# Patient Record
Sex: Female | Born: 1935
Health system: Southern US, Community
[De-identification: ages and names within clinical notes are randomized; demographics above are authoritative.]

## PROBLEM LIST (undated history)

## (undated) DIAGNOSIS — E785 Hyperlipidemia, unspecified: Secondary | ICD-10-CM

## (undated) DIAGNOSIS — I350 Nonrheumatic aortic (valve) stenosis: Secondary | ICD-10-CM

## (undated) DIAGNOSIS — K5730 Diverticulosis of large intestine without perforation or abscess without bleeding: Secondary | ICD-10-CM

## (undated) DIAGNOSIS — M069 Rheumatoid arthritis, unspecified: Secondary | ICD-10-CM

## (undated) DIAGNOSIS — I4891 Unspecified atrial fibrillation: Secondary | ICD-10-CM

## (undated) DIAGNOSIS — Z86718 Personal history of other venous thrombosis and embolism: Secondary | ICD-10-CM

## (undated) DIAGNOSIS — K8020 Calculus of gallbladder without cholecystitis without obstruction: Secondary | ICD-10-CM

## (undated) DIAGNOSIS — C73 Malignant neoplasm of thyroid gland: Secondary | ICD-10-CM

## (undated) DIAGNOSIS — K2970 Gastritis, unspecified, without bleeding: Secondary | ICD-10-CM

## (undated) DIAGNOSIS — I6529 Occlusion and stenosis of unspecified carotid artery: Secondary | ICD-10-CM

## (undated) DIAGNOSIS — J189 Pneumonia, unspecified organism: Secondary | ICD-10-CM

## (undated) DIAGNOSIS — I2510 Atherosclerotic heart disease of native coronary artery without angina pectoris: Secondary | ICD-10-CM

## (undated) DIAGNOSIS — I5032 Chronic diastolic (congestive) heart failure: Secondary | ICD-10-CM

## (undated) DIAGNOSIS — Z8739 Personal history of other diseases of the musculoskeletal system and connective tissue: Secondary | ICD-10-CM

## (undated) DIAGNOSIS — I10 Essential (primary) hypertension: Secondary | ICD-10-CM

## (undated) DIAGNOSIS — M810 Age-related osteoporosis without current pathological fracture: Secondary | ICD-10-CM

## (undated) DIAGNOSIS — H3530 Unspecified macular degeneration: Secondary | ICD-10-CM

## (undated) HISTORY — DX: Unspecified atrial fibrillation: I48.91

## (undated) HISTORY — DX: Age-related osteoporosis without current pathological fracture: M81.0

## (undated) HISTORY — PX: CATARACT EXTRACTION, BILATERAL: SHX1313

## (undated) HISTORY — PX: COLONOSCOPY: SHX174

## (undated) HISTORY — PX: BREAST CYST EXCISION: SHX579

## (undated) HISTORY — DX: Personal history of other diseases of the musculoskeletal system and connective tissue: Z87.39

## (undated) HISTORY — PX: ESOPHAGOGASTRODUODENOSCOPY: SHX1529

## (undated) HISTORY — DX: Rheumatoid arthritis, unspecified: M06.9

## (undated) HISTORY — DX: Calculus of gallbladder without cholecystitis without obstruction: K80.20

## (undated) HISTORY — DX: Malignant neoplasm of thyroid gland: C73

## (undated) HISTORY — DX: Chronic diastolic (congestive) heart failure: I50.32

## (undated) HISTORY — DX: Atherosclerotic heart disease of native coronary artery without angina pectoris: I25.10

## (undated) HISTORY — DX: Gastritis, unspecified, without bleeding: K29.70

## (undated) HISTORY — DX: Hyperlipidemia, unspecified: E78.5

## (undated) HISTORY — DX: Nonrheumatic aortic (valve) stenosis: I35.0

## (undated) HISTORY — DX: Pneumonia, unspecified organism: J18.9

## (undated) HISTORY — DX: Essential (primary) hypertension: I10

## (undated) HISTORY — DX: Unspecified macular degeneration: H35.30

## (undated) HISTORY — DX: Personal history of other venous thrombosis and embolism: Z86.718

## (undated) HISTORY — DX: Occlusion and stenosis of unspecified carotid artery: I65.29

## (undated) HISTORY — DX: Diverticulosis of large intestine without perforation or abscess without bleeding: K57.30

## (undated) HISTORY — PX: TONSILLECTOMY: SUR1361

---

## 1957-10-05 HISTORY — PX: APPENDECTOMY: SHX54

## 1982-10-05 DIAGNOSIS — Z86718 Personal history of other venous thrombosis and embolism: Secondary | ICD-10-CM

## 1982-10-05 HISTORY — PX: ABDOMINAL HYSTERECTOMY: SHX81

## 1982-10-05 HISTORY — DX: Personal history of other venous thrombosis and embolism: Z86.718

## 2002-10-05 HISTORY — PX: WRIST SURGERY: SHX841

## 2003-04-25 ENCOUNTER — Ambulatory Visit (HOSPITAL_COMMUNITY): Admission: RE | Admit: 2003-04-25 | Discharge: 2003-04-25 | Payer: Self-pay | Admitting: Family Medicine

## 2003-04-25 ENCOUNTER — Encounter: Payer: Self-pay | Admitting: Family Medicine

## 2003-06-10 ENCOUNTER — Encounter: Payer: Self-pay | Admitting: Family Medicine

## 2003-06-10 ENCOUNTER — Ambulatory Visit (HOSPITAL_COMMUNITY): Admission: RE | Admit: 2003-06-10 | Discharge: 2003-06-10 | Payer: Self-pay | Admitting: Family Medicine

## 2003-08-23 ENCOUNTER — Ambulatory Visit (HOSPITAL_COMMUNITY): Admission: RE | Admit: 2003-08-23 | Discharge: 2003-08-23 | Payer: Self-pay | Admitting: Cardiology

## 2003-10-01 ENCOUNTER — Emergency Department (HOSPITAL_COMMUNITY): Admission: EM | Admit: 2003-10-01 | Discharge: 2003-10-01 | Payer: Self-pay | Admitting: Emergency Medicine

## 2003-10-08 ENCOUNTER — Encounter: Admission: RE | Admit: 2003-10-08 | Discharge: 2003-10-08 | Payer: Self-pay | Admitting: Orthopedic Surgery

## 2003-10-09 ENCOUNTER — Ambulatory Visit (HOSPITAL_BASED_OUTPATIENT_CLINIC_OR_DEPARTMENT_OTHER): Admission: RE | Admit: 2003-10-09 | Discharge: 2003-10-09 | Payer: Self-pay | Admitting: Orthopedic Surgery

## 2003-10-09 ENCOUNTER — Ambulatory Visit (HOSPITAL_COMMUNITY): Admission: RE | Admit: 2003-10-09 | Discharge: 2003-10-09 | Payer: Self-pay | Admitting: Orthopedic Surgery

## 2004-12-02 ENCOUNTER — Ambulatory Visit: Payer: Self-pay | Admitting: Cardiology

## 2004-12-11 ENCOUNTER — Ambulatory Visit: Payer: Self-pay

## 2005-02-26 ENCOUNTER — Ambulatory Visit: Payer: Self-pay | Admitting: Internal Medicine

## 2005-04-03 ENCOUNTER — Ambulatory Visit: Payer: Self-pay | Admitting: Internal Medicine

## 2005-06-04 ENCOUNTER — Ambulatory Visit: Payer: Self-pay | Admitting: Internal Medicine

## 2005-06-25 ENCOUNTER — Ambulatory Visit: Payer: Self-pay | Admitting: Cardiology

## 2005-12-25 ENCOUNTER — Ambulatory Visit: Payer: Self-pay | Admitting: Cardiology

## 2005-12-31 ENCOUNTER — Ambulatory Visit (HOSPITAL_COMMUNITY): Admission: RE | Admit: 2005-12-31 | Discharge: 2005-12-31 | Payer: Self-pay | Admitting: Family Medicine

## 2006-01-01 ENCOUNTER — Ambulatory Visit (HOSPITAL_COMMUNITY): Admission: RE | Admit: 2006-01-01 | Discharge: 2006-01-01 | Payer: Self-pay | Admitting: Family Medicine

## 2006-01-21 ENCOUNTER — Encounter (HOSPITAL_COMMUNITY): Admission: RE | Admit: 2006-01-21 | Discharge: 2006-04-21 | Payer: Self-pay | Admitting: General Surgery

## 2006-01-28 ENCOUNTER — Encounter: Admission: RE | Admit: 2006-01-28 | Discharge: 2006-01-28 | Payer: Self-pay | Admitting: General Surgery

## 2006-01-28 ENCOUNTER — Encounter (INDEPENDENT_AMBULATORY_CARE_PROVIDER_SITE_OTHER): Payer: Self-pay | Admitting: Specialist

## 2006-01-28 ENCOUNTER — Other Ambulatory Visit: Admission: RE | Admit: 2006-01-28 | Discharge: 2006-01-28 | Payer: Self-pay | Admitting: Interventional Radiology

## 2006-03-08 ENCOUNTER — Ambulatory Visit (HOSPITAL_COMMUNITY): Admission: RE | Admit: 2006-03-08 | Discharge: 2006-03-09 | Payer: Self-pay | Admitting: General Surgery

## 2006-03-08 ENCOUNTER — Encounter (INDEPENDENT_AMBULATORY_CARE_PROVIDER_SITE_OTHER): Payer: Self-pay | Admitting: Specialist

## 2006-04-21 ENCOUNTER — Encounter (INDEPENDENT_AMBULATORY_CARE_PROVIDER_SITE_OTHER): Payer: Self-pay | Admitting: *Deleted

## 2006-04-21 ENCOUNTER — Ambulatory Visit (HOSPITAL_COMMUNITY): Admission: RE | Admit: 2006-04-21 | Discharge: 2006-04-22 | Payer: Self-pay | Admitting: General Surgery

## 2006-05-07 ENCOUNTER — Encounter: Admission: RE | Admit: 2006-05-07 | Discharge: 2006-05-07 | Payer: Self-pay | Admitting: Endocrinology

## 2006-05-14 ENCOUNTER — Encounter: Admission: RE | Admit: 2006-05-14 | Discharge: 2006-05-14 | Payer: Self-pay | Admitting: Endocrinology

## 2006-05-21 ENCOUNTER — Encounter: Admission: RE | Admit: 2006-05-21 | Discharge: 2006-05-21 | Payer: Self-pay | Admitting: Endocrinology

## 2006-06-10 ENCOUNTER — Ambulatory Visit: Payer: Self-pay | Admitting: Cardiology

## 2006-06-18 ENCOUNTER — Ambulatory Visit (HOSPITAL_COMMUNITY): Admission: RE | Admit: 2006-06-18 | Discharge: 2006-06-18 | Payer: Self-pay | Admitting: Family Medicine

## 2006-10-05 DIAGNOSIS — C73 Malignant neoplasm of thyroid gland: Secondary | ICD-10-CM

## 2006-10-05 HISTORY — DX: Malignant neoplasm of thyroid gland: C73

## 2006-10-26 ENCOUNTER — Ambulatory Visit (HOSPITAL_COMMUNITY): Admission: RE | Admit: 2006-10-26 | Discharge: 2006-10-26 | Payer: Self-pay | Admitting: Family Medicine

## 2006-12-09 ENCOUNTER — Ambulatory Visit: Payer: Self-pay | Admitting: Cardiology

## 2007-04-13 ENCOUNTER — Encounter: Admission: RE | Admit: 2007-04-13 | Discharge: 2007-04-13 | Payer: Self-pay | Admitting: General Surgery

## 2007-06-13 ENCOUNTER — Encounter (HOSPITAL_COMMUNITY): Admission: RE | Admit: 2007-06-13 | Discharge: 2007-06-17 | Payer: Self-pay | Admitting: Endocrinology

## 2007-06-28 ENCOUNTER — Ambulatory Visit (HOSPITAL_COMMUNITY): Admission: RE | Admit: 2007-06-28 | Discharge: 2007-06-28 | Payer: Self-pay | Admitting: Family Medicine

## 2007-10-06 DIAGNOSIS — K8020 Calculus of gallbladder without cholecystitis without obstruction: Secondary | ICD-10-CM

## 2007-10-06 HISTORY — DX: Calculus of gallbladder without cholecystitis without obstruction: K80.20

## 2007-10-06 HISTORY — PX: CHOLECYSTECTOMY: SHX55

## 2007-12-14 ENCOUNTER — Ambulatory Visit: Payer: Self-pay | Admitting: Cardiology

## 2007-12-22 ENCOUNTER — Ambulatory Visit: Payer: Self-pay

## 2008-04-27 ENCOUNTER — Ambulatory Visit: Payer: Self-pay | Admitting: Cardiovascular Disease

## 2008-04-28 ENCOUNTER — Inpatient Hospital Stay (HOSPITAL_COMMUNITY): Admission: EM | Admit: 2008-04-28 | Discharge: 2008-04-29 | Payer: Self-pay | Admitting: Emergency Medicine

## 2008-05-01 ENCOUNTER — Ambulatory Visit: Payer: Self-pay | Admitting: Internal Medicine

## 2008-05-07 ENCOUNTER — Encounter: Admission: RE | Admit: 2008-05-07 | Discharge: 2008-05-07 | Payer: Self-pay | Admitting: General Surgery

## 2008-05-08 ENCOUNTER — Ambulatory Visit: Payer: Self-pay | Admitting: Cardiology

## 2008-05-31 ENCOUNTER — Ambulatory Visit: Payer: Self-pay

## 2008-06-22 ENCOUNTER — Ambulatory Visit: Payer: Self-pay | Admitting: Cardiology

## 2008-06-22 ENCOUNTER — Observation Stay (HOSPITAL_COMMUNITY): Admission: EM | Admit: 2008-06-22 | Discharge: 2008-06-26 | Payer: Self-pay | Admitting: Emergency Medicine

## 2008-07-11 ENCOUNTER — Encounter (INDEPENDENT_AMBULATORY_CARE_PROVIDER_SITE_OTHER): Payer: Self-pay | Admitting: General Surgery

## 2008-07-11 ENCOUNTER — Ambulatory Visit (HOSPITAL_COMMUNITY): Admission: RE | Admit: 2008-07-11 | Discharge: 2008-07-12 | Payer: Self-pay | Admitting: General Surgery

## 2008-07-18 ENCOUNTER — Ambulatory Visit: Payer: Self-pay | Admitting: Cardiology

## 2008-07-31 ENCOUNTER — Encounter: Payer: Self-pay | Admitting: Internal Medicine

## 2008-10-03 ENCOUNTER — Encounter: Admission: RE | Admit: 2008-10-03 | Discharge: 2008-10-03 | Payer: Self-pay | Admitting: Endocrinology

## 2008-10-05 HISTORY — PX: IM NAILING FEMORAL SHAFT RETROGRADE: SUR732

## 2009-01-09 DIAGNOSIS — I2510 Atherosclerotic heart disease of native coronary artery without angina pectoris: Secondary | ICD-10-CM | POA: Insufficient documentation

## 2009-01-09 DIAGNOSIS — K5730 Diverticulosis of large intestine without perforation or abscess without bleeding: Secondary | ICD-10-CM | POA: Insufficient documentation

## 2009-01-09 DIAGNOSIS — M810 Age-related osteoporosis without current pathological fracture: Secondary | ICD-10-CM | POA: Insufficient documentation

## 2009-01-09 DIAGNOSIS — M069 Rheumatoid arthritis, unspecified: Secondary | ICD-10-CM | POA: Insufficient documentation

## 2009-01-09 DIAGNOSIS — H3530 Unspecified macular degeneration: Secondary | ICD-10-CM | POA: Insufficient documentation

## 2009-01-10 ENCOUNTER — Encounter: Payer: Self-pay | Admitting: Cardiology

## 2009-01-10 ENCOUNTER — Ambulatory Visit: Payer: Self-pay | Admitting: Cardiology

## 2009-01-10 ENCOUNTER — Ambulatory Visit: Payer: Self-pay

## 2009-01-10 DIAGNOSIS — E785 Hyperlipidemia, unspecified: Secondary | ICD-10-CM | POA: Insufficient documentation

## 2009-01-10 DIAGNOSIS — I6529 Occlusion and stenosis of unspecified carotid artery: Secondary | ICD-10-CM | POA: Insufficient documentation

## 2009-02-04 ENCOUNTER — Encounter: Payer: Self-pay | Admitting: Cardiology

## 2009-02-08 ENCOUNTER — Encounter: Payer: Self-pay | Admitting: Cardiology

## 2009-04-04 ENCOUNTER — Encounter: Payer: Self-pay | Admitting: Cardiology

## 2009-04-15 ENCOUNTER — Telehealth: Payer: Self-pay | Admitting: Cardiology

## 2009-04-25 ENCOUNTER — Telehealth: Payer: Self-pay | Admitting: Cardiology

## 2009-05-09 ENCOUNTER — Encounter: Admission: RE | Admit: 2009-05-09 | Discharge: 2009-05-09 | Payer: Self-pay | Admitting: General Surgery

## 2009-05-13 ENCOUNTER — Ambulatory Visit (HOSPITAL_BASED_OUTPATIENT_CLINIC_OR_DEPARTMENT_OTHER): Admission: RE | Admit: 2009-05-13 | Discharge: 2009-05-13 | Payer: Self-pay | Admitting: General Surgery

## 2009-05-13 ENCOUNTER — Encounter (INDEPENDENT_AMBULATORY_CARE_PROVIDER_SITE_OTHER): Payer: Self-pay | Admitting: General Surgery

## 2009-05-22 ENCOUNTER — Encounter (INDEPENDENT_AMBULATORY_CARE_PROVIDER_SITE_OTHER): Payer: Self-pay | Admitting: *Deleted

## 2009-07-11 ENCOUNTER — Ambulatory Visit: Payer: Self-pay | Admitting: Cardiology

## 2009-07-11 DIAGNOSIS — I10 Essential (primary) hypertension: Secondary | ICD-10-CM | POA: Insufficient documentation

## 2009-07-31 ENCOUNTER — Inpatient Hospital Stay (HOSPITAL_COMMUNITY): Admission: EM | Admit: 2009-07-31 | Discharge: 2009-08-05 | Payer: Self-pay | Admitting: Emergency Medicine

## 2009-07-31 ENCOUNTER — Ambulatory Visit: Payer: Self-pay | Admitting: Internal Medicine

## 2009-07-31 ENCOUNTER — Encounter: Payer: Self-pay | Admitting: Cardiology

## 2009-08-01 ENCOUNTER — Encounter (INDEPENDENT_AMBULATORY_CARE_PROVIDER_SITE_OTHER): Payer: Self-pay | Admitting: Internal Medicine

## 2009-09-26 ENCOUNTER — Ambulatory Visit: Payer: Self-pay | Admitting: Cardiology

## 2010-01-13 ENCOUNTER — Encounter: Payer: Self-pay | Admitting: Cardiology

## 2010-01-14 ENCOUNTER — Ambulatory Visit: Payer: Self-pay

## 2010-01-14 ENCOUNTER — Encounter: Payer: Self-pay | Admitting: Cardiology

## 2010-03-25 ENCOUNTER — Encounter: Payer: Self-pay | Admitting: Cardiology

## 2010-03-28 ENCOUNTER — Ambulatory Visit: Payer: Self-pay | Admitting: Cardiology

## 2010-11-03 ENCOUNTER — Other Ambulatory Visit: Payer: Self-pay | Admitting: Dermatology

## 2010-11-06 NOTE — Miscellaneous (Signed)
Summary: Orders Update  Clinical Lists Changes  Orders: Added new Test order of Carotid Duplex (Carotid Duplex) - Signed 

## 2010-11-06 NOTE — Miscellaneous (Signed)
  Clinical Lists Changes  Observations: Added new observation of US CAROTID: Stable, mild carotid artery disease, bilaterally 40-59% RICA stenosis ( low end range) 0-39% LICA stenosis  f.u 1 year (01/14/2010 10:49)      Carotid Doppler  Procedure date:  01/14/2010  Findings:      Stable, mild carotid artery disease, bilaterally 40-59% RICA stenosis ( low end range) 0-39% LICA stenosis  f.u 1 year

## 2010-11-06 NOTE — Assessment & Plan Note (Signed)
Summary: 6 months 401.1 414.01 pfh,rn   Visit Type:  Follow-up Primary Provider:  Laurene Footman, MD  CC:  CAD.  History of Present Illness: The patient presents for a six-month followup. Since I last saw her she has done well. She remains active exercising routinely. She has had no chest pressure, neck or arm discomfort. She has had no palpitations, presyncope or syncope. She has had no PND or orthopnea. She had some mild discomfort with varicose veins.  Current Medications (verified): 1)  Plavix 75 Mg Tabs (Clopidogrel Bisulfate) .... One By Mouth Daily 2)  Lipitor 40 Mg Tabs (Atorvastatin Calcium) .... One By Mouth Daily 3)  Imdur 30 Mg Xr24h-Tab (Isosorbide Mononitrate) .... Dialy 4)  Metoprolol Tartrate 50 Mg Tabs (Metoprolol Tartrate) .... One Three Times A Day 5)  Asprin 81mg  .... Dialy 6)  Mvi .... One Daily 7)  Otc Eye Gtts .... Daily 8)  Ca With Vitamin D .... Daily 9)  Fish Oil .... Daily 10)  Synthroid 100 Mcg Tabs (Levothyroxine Sodium) .... Daily Except Sunday- 1/2 Tablet 11)  Preservision Areds  Caps (Multiple Vitamins-Minerals) .... Two Times A Day 12)  Tylenol .... As Needed 13)  Nitrostat 0.4 Mg Subl (Nitroglycerin) .... 1 By Mouth As Needed 14)  Cozaar 25 Mg Tabs (Losartan Potassium) .... One Daily  Allergies (verified): 1)  ! Prednisone 2)  ! Hydrocodone  Past History:  Past Medical History: Reviewed history from 01/09/2009 and no changes required. CAD (ICD-414.00) (Right coronary artery was dominant.  There was long 99% stenosis starting in the proximal RCA.  There was an 80% stenosis in the more distal RCA.  There was extensive left-to-right collaterals.  The left main was normal.  Left circumflex had 100% distal vessel.  There were left-to-left collaterals filling the distal vessel. The LAD was occluded in the mid segment.  Distal LAD filled via left-to- left collaterals from obtuse marginal to circumflex.  There were 2 patent diagonals.  There was 50%  stenosis in the second diagonal and 50% proximal LAD stenosis. ) MACULAR DEGENERATION (ICD-362.50) DIVERTICULAR DISEASE (ICD-562.10) OSTEOPOROSIS (ICD-733.00) RHEUMATOID ARTHRITIS (ICD-714.0)  Past Surgical History: Reviewed history from 09/26/2009 and no changes required. Appendectomy Tonsillectomy Hysterectomy Left subtrochanteric femur intramedullary nailing.   Review of Systems       As stated in the HPI and negative for all other systems.   Vital Signs:  Patient profile:   75 year old female Height:      63 inches Weight:      145 pounds BMI:     25.78 Pulse rate:   63 / minute Resp:     16  per minute BP sitting:   140 / 70  (right arm)  Vitals Entered By: Marrion Coy, CNA (March 28, 2010 8:40 AM)  Physical Exam  General:  Well developed, well nourished, in no acute distress. Head:  normocephalic and atraumatic Eyes:  PERRLA/EOM intact; conjunctiva and lids normal. Neck:  Neck supple, no JVD. No masses, thyromegaly or abnormal cervical nodes. Chest Wall:  no deformities or breast masses noted Abdomen:  Bowel sounds positive; abdomen soft and non-tender without masses, organomegaly, or hernias noted. No hepatosplenomegaly. Msk:  Back normal, normal gait. Muscle strength and tone normal. Pulses:  normal pedal pulses bilaterally.   Extremities:  No clubbing or cyanosis. Neurologic:  Alert and oriented x 3. Skin:  Intact without lesions or rashes. Psych:  Normal affect.   EKG  Procedure date:  03/28/2010  Findings:  Sinus rhythm, rate 63, axis within normal limits, intervals within normal limits, no acute ST-T wave changes.  Impression & Recommendations:  Problem # 1:  CAD, UNSPECIFIED SITE (ICD-414.00) She has no new symptoms and will continue with risk reduction. No further studies or change in medication is indicated. Orders: EKG w/ Interpretation (93000)  Problem # 2:  ESSENTIAL HYPERTENSION, BENIGN (ICD-401.1) Her blood pressure is at upper  limits of normal. She will check this at home and I will adjust meds if she is above target.  Problem # 3:  HYPERLIPIDEMIA (ICD-272.4) This is followed by Dr. Evlyn Kanner. I will defer to his expertise.  Problem # 4:  CAROTID STENOSIS (ICD-433.10) She had this checked recently and she has nonobstructive disease to be followed again in one year.  Patient Instructions: 1)  Your physician recommends that you schedule a follow-up appointment in: 12 months with Dr Antoine Poche 2)  Your physician recommends that you continue on your current medications as directed. Please refer to the Current Medication list given to you today.

## 2010-11-12 ENCOUNTER — Other Ambulatory Visit: Payer: Self-pay | Admitting: Endocrinology

## 2010-11-12 DIAGNOSIS — R911 Solitary pulmonary nodule: Secondary | ICD-10-CM

## 2010-11-14 ENCOUNTER — Ambulatory Visit
Admission: RE | Admit: 2010-11-14 | Discharge: 2010-11-14 | Disposition: A | Discharge status: Home / Self Care | Payer: Medicare Other | Source: Ambulatory Visit | Attending: Endocrinology | Admitting: Endocrinology

## 2010-11-14 DIAGNOSIS — R911 Solitary pulmonary nodule: Secondary | ICD-10-CM

## 2010-11-14 MED ORDER — IOHEXOL 300 MG/ML  SOLN
75.0000 mL | Freq: Once | INTRAMUSCULAR | Status: AC | PRN
  Administered 2010-11-14: 75 mL via INTRAVENOUS

## 2010-12-02 ENCOUNTER — Other Ambulatory Visit (HOSPITAL_COMMUNITY): Payer: Self-pay | Admitting: Endocrinology

## 2010-12-02 DIAGNOSIS — S2239XA Fracture of one rib, unspecified side, initial encounter for closed fracture: Secondary | ICD-10-CM

## 2010-12-04 ENCOUNTER — Encounter (HOSPITAL_COMMUNITY): Payer: Self-pay

## 2010-12-04 ENCOUNTER — Encounter (HOSPITAL_COMMUNITY)
Admission: RE | Admit: 2010-12-04 | Discharge: 2010-12-04 | Disposition: A | Discharge status: Home / Self Care | Payer: Medicare Other | Source: Ambulatory Visit | Attending: Endocrinology | Admitting: Endocrinology

## 2010-12-04 ENCOUNTER — Ambulatory Visit (HOSPITAL_COMMUNITY)
Admission: RE | Admit: 2010-12-04 | Discharge: 2010-12-04 | Disposition: A | Discharge status: Home / Self Care | Payer: Medicare Other | Source: Ambulatory Visit | Attending: Endocrinology | Admitting: Endocrinology

## 2010-12-04 DIAGNOSIS — M069 Rheumatoid arthritis, unspecified: Secondary | ICD-10-CM | POA: Insufficient documentation

## 2010-12-04 DIAGNOSIS — S2239XA Fracture of one rib, unspecified side, initial encounter for closed fracture: Secondary | ICD-10-CM

## 2010-12-04 DIAGNOSIS — Z85850 Personal history of malignant neoplasm of thyroid: Secondary | ICD-10-CM | POA: Insufficient documentation

## 2010-12-04 DIAGNOSIS — Z4789 Encounter for other orthopedic aftercare: Secondary | ICD-10-CM | POA: Insufficient documentation

## 2010-12-04 DIAGNOSIS — M810 Age-related osteoporosis without current pathological fracture: Secondary | ICD-10-CM | POA: Insufficient documentation

## 2010-12-04 DIAGNOSIS — R079 Chest pain, unspecified: Secondary | ICD-10-CM | POA: Insufficient documentation

## 2010-12-04 MED ORDER — TECHNETIUM TC 99M MEDRONATE IV KIT
23.9000 | PACK | Freq: Once | INTRAVENOUS | Status: AC | PRN
  Administered 2010-12-04: 23.9 via INTRAVENOUS

## 2011-01-07 LAB — CBC
HCT: 25.7 % — ABNORMAL LOW (ref 36.0–46.0)
Platelets: 169 10*3/uL (ref 150–400)
RBC: 2.75 MIL/uL — ABNORMAL LOW (ref 3.87–5.11)

## 2011-01-07 LAB — BASIC METABOLIC PANEL
BUN: 12 mg/dL (ref 6–23)
Calcium: 7 mg/dL — ABNORMAL LOW (ref 8.4–10.5)
GFR calc Af Amer: >60 mL/min (ref >60)
Potassium: 3.7 mEq/L (ref 3.5–5.1)

## 2011-01-08 LAB — LIPID PANEL
Cholesterol: 130 mg/dL (ref 0–200)
HDL: 35 mg/dL — ABNORMAL LOW (ref >39)
LDL Cholesterol: 88 mg/dL (ref 0–99)
Total CHOL/HDL Ratio: 3.7 RATIO
Triglycerides: 36 mg/dL (ref <150)
VLDL: 7 mg/dL (ref 0–40)

## 2011-01-08 LAB — CBC
HCT: 21.3 % — ABNORMAL LOW (ref 36.0–46.0)
HCT: 26.1 % — ABNORMAL LOW (ref 36.0–46.0)
HCT: 26.6 % — ABNORMAL LOW (ref 36.0–46.0)
HCT: 27.7 % — ABNORMAL LOW (ref 36.0–46.0)
HCT: 31.2 % — ABNORMAL LOW (ref 36.0–46.0)
HCT: 37 % (ref 36.0–46.0)
Hemoglobin: 10.1 g/dL — ABNORMAL LOW (ref 12.0–15.0)
Hemoglobin: 10.7 g/dL — ABNORMAL LOW (ref 12.0–15.0)
Hemoglobin: 12.6 g/dL (ref 12.0–15.0)
Hemoglobin: 7.4 g/dL — CL (ref 12.0–15.0)
Hemoglobin: 8.4 g/dL — ABNORMAL LOW (ref 12.0–15.0)
Hemoglobin: 9.1 g/dL — ABNORMAL LOW (ref 12.0–15.0)
MCHC: 34.7 g/dL (ref 30.0–36.0)
MCHC: 34.8 g/dL (ref 30.0–36.0)
MCHC: 35 g/dL (ref 30.0–36.0)
MCHC: 35 g/dL (ref 30.0–36.0)
MCHC: 35.1 g/dL (ref 30.0–36.0)
MCV: 92.6 fL (ref 78.0–100.0)
MCV: 92.7 fL (ref 78.0–100.0)
MCV: 93 fL (ref 78.0–100.0)
MCV: 93.3 fL (ref 78.0–100.0)
MCV: 94.9 fL (ref 78.0–100.0)
MCV: 95.4 fL (ref 78.0–100.0)
Platelets: 147 10*3/uL — ABNORMAL LOW (ref 150–400)
Platelets: 148 10*3/uL — ABNORMAL LOW (ref 150–400)
Platelets: 161 10*3/uL (ref 150–400)
Platelets: 166 10*3/uL (ref 150–400)
Platelets: 196 10*3/uL (ref 150–400)
RBC: 2.47 MIL/uL — ABNORMAL LOW (ref 3.87–5.11)
RBC: 2.58 MIL/uL — ABNORMAL LOW (ref 3.87–5.11)
RBC: 3.13 MIL/uL — ABNORMAL LOW (ref 3.87–5.11)
RBC: 3.35 MIL/uL — ABNORMAL LOW (ref 3.87–5.11)
RDW: 14.1 % (ref 11.5–15.5)
RDW: 14.5 % (ref 11.5–15.5)
WBC: 4.4 10*3/uL (ref 4.0–10.5)
WBC: 6.6 10*3/uL (ref 4.0–10.5)
WBC: 6.9 10*3/uL (ref 4.0–10.5)
WBC: 7.1 10*3/uL (ref 4.0–10.5)
WBC: 7.6 10*3/uL (ref 4.0–10.5)
WBC: 8.1 10*3/uL (ref 4.0–10.5)
WBC: 9.3 10*3/uL (ref 4.0–10.5)

## 2011-01-08 LAB — BASIC METABOLIC PANEL
BUN: 12 mg/dL (ref 6–23)
BUN: 14 mg/dL (ref 6–23)
BUN: 16 mg/dL (ref 6–23)
CO2: 24 mEq/L (ref 19–32)
CO2: 24 mEq/L (ref 19–32)
CO2: 28 mEq/L (ref 19–32)
Calcium: 6.9 mg/dL — ABNORMAL LOW (ref 8.4–10.5)
Calcium: 7.3 mg/dL — ABNORMAL LOW (ref 8.4–10.5)
Calcium: 7.4 mg/dL — ABNORMAL LOW (ref 8.4–10.5)
Chloride: 104 mEq/L (ref 96–112)
Chloride: 105 mEq/L (ref 96–112)
Chloride: 99 mEq/L (ref 96–112)
Creatinine, Ser: 0.83 mg/dL (ref 0.4–1.2)
Creatinine, Ser: 0.84 mg/dL (ref 0.4–1.2)
GFR calc Af Amer: >60 mL/min (ref >60)
GFR calc Af Amer: >60 mL/min (ref >60)
GFR calc Af Amer: >60 mL/min (ref >60)
GFR calc Af Amer: >60 mL/min (ref >60)
GFR calc non Af Amer: >60 mL/min (ref >60)
GFR calc non Af Amer: >60 mL/min (ref >60)
Glucose, Bld: 94 mg/dL (ref 70–99)
Potassium: 3.8 mEq/L (ref 3.5–5.1)
Potassium: 4 mEq/L (ref 3.5–5.1)
Potassium: 4.5 mEq/L (ref 3.5–5.1)
Sodium: 130 mEq/L — ABNORMAL LOW (ref 135–145)
Sodium: 133 mEq/L — ABNORMAL LOW (ref 135–145)

## 2011-01-08 LAB — TSH: TSH: 0.133 u[IU]/mL — ABNORMAL LOW (ref 0.350–4.500)

## 2011-01-08 LAB — IRON AND TIBC
Iron: 38 ug/dL — ABNORMAL LOW (ref 42–135)
TIBC: 138 ug/dL — ABNORMAL LOW (ref 250–470)

## 2011-01-08 LAB — CROSSMATCH
ABO/RH(D): A POS
Antibody Screen: NEGATIVE

## 2011-01-08 LAB — URINALYSIS, ROUTINE W REFLEX MICROSCOPIC
Bilirubin Urine: NEGATIVE
Nitrite: NEGATIVE
Specific Gravity, Urine: 1.021 (ref 1.005–1.030)
Urobilinogen, UA: 1 mg/dL (ref 0.0–1.0)
pH: 6 (ref 5.0–8.0)

## 2011-01-08 LAB — DIFFERENTIAL
Eosinophils Relative: 0 % (ref 0–5)
Lymphocytes Relative: 7 % — ABNORMAL LOW (ref 12–46)
Lymphs Abs: 0.7 10*3/uL (ref 0.7–4.0)
Monocytes Absolute: 0.5 10*3/uL (ref 0.1–1.0)
Neutro Abs: 8.1 10*3/uL — ABNORMAL HIGH (ref 1.7–7.7)

## 2011-01-08 LAB — BRAIN NATRIURETIC PEPTIDE: Pro B Natriuretic peptide (BNP): 283 pg/mL — ABNORMAL HIGH (ref 0.0–100.0)

## 2011-01-08 LAB — RETICULOCYTES: Retic Count, Absolute: 42.5 10*3/uL (ref 19.0–186.0)

## 2011-01-08 LAB — CARDIAC PANEL(CRET KIN+CKTOT+MB+TROPI)
CK, MB: 14.9 ng/mL — ABNORMAL HIGH (ref 0.3–4.0)
CK, MB: 19.7 ng/mL — ABNORMAL HIGH (ref 0.3–4.0)
CK, MB: 7.6 ng/mL — ABNORMAL HIGH (ref 0.3–4.0)
Relative Index: 0.8 (ref 0.0–2.5)
Relative Index: 1.3 (ref 0.0–2.5)
Relative Index: 1.9 (ref 0.0–2.5)
Total CK: 1064 U/L — ABNORMAL HIGH (ref 7–177)
Total CK: 1120 U/L — ABNORMAL HIGH (ref 7–177)
Total CK: 810 U/L — ABNORMAL HIGH (ref 7–177)
Total CK: 916 U/L — ABNORMAL HIGH (ref 7–177)
Total CK: 974 U/L — ABNORMAL HIGH (ref 7–177)
Total CK: 996 U/L — ABNORMAL HIGH (ref 7–177)
Troponin I: 0.1 ng/mL — ABNORMAL HIGH (ref 0.00–0.06)
Troponin I: 0.45 ng/mL — ABNORMAL HIGH (ref 0.00–0.06)
Troponin I: 0.71 ng/mL (ref 0.00–0.06)
Troponin I: 1.02 ng/mL (ref 0.00–0.06)

## 2011-01-08 LAB — PROTIME-INR
INR: 1.71 — ABNORMAL HIGH (ref 0.00–1.49)
Prothrombin Time: 13.3 seconds (ref 11.6–15.2)
Prothrombin Time: 15.7 seconds — ABNORMAL HIGH (ref 11.6–15.2)
Prothrombin Time: 19.9 seconds — ABNORMAL HIGH (ref 11.6–15.2)

## 2011-01-08 LAB — URINE CULTURE

## 2011-01-08 LAB — POCT CARDIAC MARKERS
Myoglobin, poc: 476 ng/mL (ref 12–200)
Troponin i, poc: <0.05 ng/mL (ref 0.00–0.09)

## 2011-01-08 LAB — APTT: aPTT: 32 seconds (ref 24–37)

## 2011-01-08 LAB — MAGNESIUM: Magnesium: 1.6 mg/dL (ref 1.5–2.5)

## 2011-01-08 LAB — ABO/RH: ABO/RH(D): A POS

## 2011-01-10 LAB — URINALYSIS, ROUTINE W REFLEX MICROSCOPIC
Bilirubin Urine: NEGATIVE
Specific Gravity, Urine: 1.016 (ref 1.005–1.030)
pH: 6.5 (ref 5.0–8.0)

## 2011-01-10 LAB — URINE MICROSCOPIC-ADD ON

## 2011-01-10 LAB — COMPREHENSIVE METABOLIC PANEL
Albumin: 4 g/dL (ref 3.5–5.2)
BUN: 18 mg/dL (ref 6–23)
Calcium: 8.8 mg/dL (ref 8.4–10.5)
Creatinine, Ser: 1.03 mg/dL (ref 0.4–1.2)
Potassium: 4.5 mEq/L (ref 3.5–5.1)
Total Protein: 7.8 g/dL (ref 6.0–8.3)

## 2011-01-10 LAB — DIFFERENTIAL
Lymphocytes Relative: 23 % (ref 12–46)
Lymphs Abs: 1 10*3/uL (ref 0.7–4.0)
Monocytes Absolute: 0.5 10*3/uL (ref 0.1–1.0)
Monocytes Relative: 13 % — ABNORMAL HIGH (ref 3–12)
Neutro Abs: 2.7 10*3/uL (ref 1.7–7.7)

## 2011-01-10 LAB — CBC
HCT: 41.3 % (ref 36.0–46.0)
MCHC: 34.2 g/dL (ref 30.0–36.0)
Platelets: 214 10*3/uL (ref 150–400)
RDW: 13.1 % (ref 11.5–15.5)

## 2011-02-17 NOTE — H&P (Signed)
NAMENOHELY, WHITEHORN NO.:  [id number]   MEDICAL RECORD NO.:  [id number]          PATIENT TYPE:  INP   LOCATION:  2007                         FACILITY:  [facility]   PHYSICIAN:  Rollene Rotunda, MD, FACCDATE OF BIRTH:  08/31/1936   DATE OF ADMISSION:  04/27/2008  DATE OF DISCHARGE:  04/29/2008                              HISTORY & PHYSICAL   PRIMARY CARE PHYSICIAN:  Ernestina Penna, MD   CHIEF COMPLAINT:  Chest pain.   HISTORY OF PRESENT ILLNESS:  This is a 75 year old white female with a  history of obstructive coronary artery disease who developed abrupt  onset of lower sternal, upper epigastric pain tonight at around 8:00  p.m. while watching TV.  The pain is described as a pressure and was  initially rated 8/10.  The pain does not radiate.  She has never had  pain like this before.  After 4 sublingual nitroglycerin, the pain is  now down to 2/10.  The patient walks several times a week, 2 miles at a  time without developing angina including this morning.  The pain was  several hours postprandial and does not feel like her typical acid  reflux.   PAST MEDICAL HISTORY:  1. Obstructive coronary artery disease status post heart      catheterization in 2004 revealing a 90% mid LAD lesion and a total      occluded LAD at the level of the second septal perforator.  Also,      highly diseased right coronary artery with multiple areas greater      than 90%.  In addition, several other vessels had nonobstructive      coronary disease.  It was felt that she had poor bypass targets and      has been treated medically with very little angina over the years.  2. Thyroid carcinoma status post surgical excision and radioactive      iodine therapy.  3. Rheumatoid arthritis.  4. Osteoporosis.  5. Hyperlipidemia.  6. Hypertension.  7. History of total abdominal hysterectomy.  8. History of appendectomy.  9. History of a negative upper endoscopy per her report  approximately      2 years ago.   SOCIAL HISTORY:  Lives in [place] with her husband.  She is a  lifelong nonsmoker.  She walks 2 miles a day, three-four times a week.   FAMILY HISTORY:  Mother died of an MI at age 37, father died of  complications of diabetes and heart disease, and the patient has one  sister with cardiovascular disease.   ALLERGIES:  PREDNISONE.   MEDICINES:  1. Aspirin 81 mg p.o. daily.  2. Plavix 75 mg p.o. daily.  3. Benicar 20 mg p.o. daily.  4. Imdur 30 mg p.o. daily.  5. Toprol-XL 100 mg p.o. daily.  6. Lipitor 40 mg p.o. daily.  7. Fosamax 70 mg p.o. q. week.  8. Synthroid 100 mcg p.o. daily.  9. Patanol eye drops as directed.  10.Tums p.r.n.  11.Calcium p.r.n.  12.Fish oil 1200 mg p.o. daily.   PHYSICAL EXAMINATION:  VITAL SIGNS:  Temperature 97.3, pulse 61,  respiratory rate 12, blood pressure 173/82, and saturation 98% on 2 L.  GENERAL:  This is a pleasant older white female in no acute distress.  HEENT:  She is wearing eye glasses.  Pupils are round and reactive.  Sclerae are clear.  Mucous membranes are moist.  No oral lesions.  NECK:  Supple.  Neck veins are flat.  No carotid bruits.  No  thyromegaly.  LUNGS:  Clear to auscultation bilaterally without wheezing or rales.  CARDIAC:  Normal rate and regular rhythm.  Normal S1 and S2 without  gallop or murmur.  ABDOMEN:  Soft but tender to palpation in the epigastrium.  There is no  rebound or guarding, and bowel sounds are present.  EXTREMITIES:  Reveal no edema, 2+ dorsalis pedis and radial pulses  bilaterally.  No rash or petechiae.  MUSCULOSKELETAL:  No acute joint effusions or deformities.  NEUROLOGIC:  The patient is awake, alert, and oriented x3.  Face is  symmetric with symmetric expressions.  Strength 5/5 in all four  extremities.   DIAGNOSTIC TESTS:  Chest x-ray shows no acute abnormality.  EKG shows  sinus bradycardia with a rate of 53 beats per minute.  No ischemic  changes.   No Q-waves.   LABORATORY:  White blood cell count 5, hemoglobin 13.6, and platelets  198,000.  Sodium 136, potassium 5, BUN 25, creatinine 1.2, and glucose  96.  CK-MB 2.2.  Troponin less than 0.05.   IMPRESSION:  This is a 75 year old white female who is currently  hypertensive with ongoing epigastric and chest pain.   PLAN:  1. Admit to Dr. Antoine Poche to the Telemetry Unit.  Rule out myocardial      infarction by cycling serial EKGs and cardiac enzymes.  2. We will increase her nitroglycerin drip and titrate for blood      pressure and chest pain control.  3. We will continue her medical regimen for coronary artery disease      including aspirin, Plavix, statin, beta-blocker, and Benicar.      Consider echocardiogram.  4. Rule out abdominal causes of pain.  Initially, we will check liver      tests and lipase.  We will consider right upper quadrant ultrasound      and possibly CT scan.  We will perform serial abdominal exams      overnight.  We will obtain her prior upper endoscopy report.  5. History of thyroid carcinoma, continue Synthroid.  6. DVT prophylaxis with subcutaneous Lovenox.  7. H2 blocker for GERD and abdominal pain.       Christell Faith, MD   Electronically Signed     ______________________________  Rollene Rotunda, MD, [facility]    NDL/MEDQ  D:  04/28/2008  T:  04/28/2008  Job:  [id number]

## 2011-02-17 NOTE — Cardiovascular Report (Signed)
[name], [name] NO.:  [id number]   MEDICAL RECORD NO.:  [id number]          PATIENT TYPE:  INP   LOCATION:  3708                         FACILITY:  [facility]   PHYSICIAN:  Marca Ancona, MD      DATE OF BIRTH:  19-Jul-1936   DATE OF PROCEDURE:  DATE OF DISCHARGE:                            CARDIAC CATHETERIZATION   PROCEDURES:  1. Left heart catheterization.  2. Coronary angiography.  3. Left ventriculography.  4. Angio-Seal deployment.   INDICATION:  Chest pain with known coronary artery disease.   PROCEDURE NOTE:  After informed consent was obtained, the right groin  was sterilely prepped and draped.  The 1% lidocaine was used to  anesthetized the right groin area.  The right common femoral artery was  entered using Seldinger technique and a 6-French arterial sheath was  placed in the right common femoral artery.  The JL4 catheter was used to  engage the left coronary artery.  The JR4 catheter was used to engage  the right coronary artery and the ventricle was entered using the angle  pigtail catheter.  At the end of the procedure, femoral angiography was  done and it was determined that the arterial stick was in a good  position and a 6-French Angio-Seal was deployed without complication.   FINDINGS:  1. Hemodynamics:  Aorta 114/52 and LV 115/3/15.  2. Left ventriculography:  LVEF 65-70% and normal wall motion.  3. Coronary angiography:  The coronary system is right dominant.  The      right coronary artery has a long up to 99% stenosis starting in the      proximal RCA extending into the mid RCA.  After an acute marginal      comes off, there is another 80% stenosis in the more distal RCA.      There are extensive left-to-right collaterals from the left      circumflex system and the septal perforators of the proximal LAD.      The left main has no significant disease.  The left circumflex is a      large artery with large obtuse marginal vessels.   There is 100%      stenosis of the distal circumflex; however, they are left-to-left      collaterals with excellent filling to the distal circumflex.  The      LAD is totally occluded in the mid vessel.  The distal LAD fills by      left-to-left collaterals from an obtuse marginal off the circumflex      system.  There are 2 patent diagonals.  There is a 50% stenosis in      the proximal second diagonal.  There is a 50% stenosis in the      proximal LAD.   ASSESSMENT AND PLAN:  There is severe right coronary artery and left  anterior descending coronary artery disease with excellent  collateralization.  The old films from 2005 were reviewed and there does  not appear to be  significant change in the coronary anatomy.  The patient has excellent  left ventricular  function.  I do question whether the pain is related to  cholelithiasis.  I do think it should be okay for her to go forward with  her cholecystectomy.  I will plan to continue medical treatment of  coronary artery disease.      Marca Ancona, MD  Electronically Signed     DM/MEDQ  D:  06/25/2008  T:  06/26/2008  Job:  [id number]

## 2011-02-17 NOTE — Assessment & Plan Note (Signed)
[facility]                            CARDIOLOGY OFFICE NOTE   NAME:[name]                      MRN:          [id number]  DATE:12/14/2007                            DOB:          01-23-1936    PRIMARY CARE PHYSICIAN:  Ernestina Penna, M.D.   REASON FOR PRESENTATION:  Evaluate patient with coronary disease.   HISTORY OF PRESENT ILLNESS:  The patient returns for 42-month followup.  She is 75 years old.  Since I last saw her, she has done well.  She has  remained active doing some exercise and walking routinely.  She did have  one episode of chest discomfort about a week ago after eating.  This was  a heavy discomfort with tingling in her hands.  She was not sure if this  was like previous angina, but she did take a sublingual nitroglycerin.  She otherwise has had no chest discomfort, neck or arm discomfort.  She  has no shortness of breath, PND or orthopnea.  She had no palpitations,  pre-syncope or syncope.   PAST MEDICAL HISTORY:  1. Coronary artery disease (see the December 09, 2006 note for details).  2. Numbness in the face and hands, treated with Plavix.  3. Disc disease.  4. Rheumatoid arthritis.  5. Osteoporosis.  6. Diverticulosis.  7. Hemorrhoids.  8. Hysterectomy.  9. Appendectomy.  10.Cyst removed from hands and left breast.  11.Tonsillectomy.   ALLERGIES AND INTOLERANCES:  PREDNISONE.   MEDICATIONS:  1. Fosamax 70 mg weekly.  2. Benicar 20 mg daily.  3. Plavix 75 mg daily.  4. Imdur 30 mg daily.  5. Metoprolol 100 mg daily.  6. Tums.  7. Patanol eye drops.  8. Lipitor 40 mg daily.  9. Aspirin 81 mg daily.  10.Calcium.  11.Synthroid 125 mcg daily.  12.Fish oil.  13.Over-the-counter eye drops.   REVIEW OF SYSTEMS:  As stated in HPI, otherwise negative for other  systems.   PHYSICAL EXAMINATION:  The patient is in no distress.  Blood pressure  149/82, heart rate 68 and regular, weight 158 pounds, body mass index  is  27.  HEENT: Eyelids unremarkable. Pupils equal, round, and reactive to light.  Fundi not visualized. Oral mucosa is unremarkable.  NECK: No jugular venous distention at 45 degrees. Carotid upstroke brisk  and symmetrical. No bruits, no thyromegaly.  LYMPHATICS: No adenopathy.  LUNGS: Clear to auscultation bilaterally.  BACK: No costovertebral angle tenderness.  CHEST: Unremarkable.  HEART: PMI not displaced or sustained. S1, S2 within normal limits. No  S3. No S4. No clicks, rub or murmurs.  ABDOMEN: Flat, positive bowel sounds, normal in frequency and pitch. No  bruits. No rebound. No guarding. No midline pulsatile mass. No  organomegaly.  SKIN: No rashes, no nodules.  EXTREMITIES: 2+ pulses throughout. No edema.   EKG: Sinus rhythm, rate 64, axis within normal limits, intervals within  normal limits, no acute ST-T wave changes.   ASSESSMENT/PLAN:  1. Coronary disease.  The patient has coronary disease as described in      previous notes.  We are  managing  this medically and she is not      having any active symptoms.  At this point, she will continue with      risk reduction.  I do plan a Cardiolite next year as it will have      been 4 years since her last one.  I will do it sooner if she has      any symptoms.  2. Dyslipidemia.  She has an excellent lipid profile which I reviewed.      This is followed by  Dr. Christell Constant.  She remains on meds as listed.  3. Carotid plaquing.  The patient reports some mild carotid plaque      some years ago.  I will follow up with carotid Doppler.  4. Hypertension.  Blood pressure is slightly elevated, but this is      unusual for her.  She is going to keep an eye on this at home.  She      will continue the meds as listed unless her blood pressure diary      suggests she needs up titration.  5. Followup.  Will see her back in 1 year or  sooner if needed.     Rollene Rotunda, MD, [facility]  Electronically Signed    JH/MedQ  DD: 12/14/2007  DT:  12/14/2007  Job #: [id number]   cc:   Ernestina Penna, M.D.

## 2011-02-17 NOTE — H&P (Signed)
[name], [name] NO.:  [id number]   MEDICAL RECORD NO.:  [id number]          PATIENT TYPE:  EMS   LOCATION:  MAJO                         FACILITY:  [facility]   PHYSICIAN:  Armanda Magic, M.D.     DATE OF BIRTH:  Mar 12, 1936   DATE OF ADMISSION:  06/22/2008  DATE OF DISCHARGE:                              HISTORY & PHYSICAL   PRIMARY CARDIOLOGIST:  Rollene Rotunda, MD, [facility].   CHIEF COMPLAINT:  Chest pain.   HISTORY OF PRESENT ILLNESS:  This is a 75 year old female with a history  of three-vessel coronary disease on medical management and  cholelithiasis who presented to the emergency room with substernal chest  pain.  She was last seen by Dr. Antoine Poche in August 2009 for a stress  myocardial perfusion scan, which showed no inducible ischemia or infarct  and normal LV function with an EF of 84%.  This was done in preparation  for gallbladder surgery.  Around 7:20 p.m. last night, she got out of  the shower and developed constant pressure of the left breast with sharp  component and no radiation of the pain.  She did have some mild  shortness of breath, although she attributed this to anxiety.  She  denied any diaphoresis, nausea, or vomiting.  She states this chest pain  is different from the pain that she was last admitted for 6 weeks ago at  which time she was found to have cholelithiasis.  She says that this  pain is located on the left side where as the pain 6 weeks ago was  located in the midsternal area.  She is currently complaining of 3/10  chest pain, but it is worse, it was 8/10.  She took 3 sublingual  nitroglycerins at home with no relief, but her nitroglycerin were  greater than a year old and she did not have any tingling under her  tongue and did not get any headache after taking them.  Apparently when  EMS got there, they gave her 4 baby aspirin and upon arrival in the  emergency room her chest pain was down to a 3/10.   PAST MEDICAL HISTORY:  1.  Coronary disease.  She has a known history of 25% proximal LAD and      a 90% LAD at the takeoff of the first diagonal.  The LAD is then      occluded after that.  Her first diagonal has 30% stenosis.  There      was a 25% left circumflex stenosis followed by 30% stenosis      extending into a large obtuse marginal branch.  The obtuse marginal      also has a 30% mid stenosis.  The right coronary artery is dominant      with a large proximal 95% stenosis followed by sequential 99% and      80% stenoses.  2. Normal LV function.  3. Rheumatoid arthritis.  4. Osteoporosis.  5. Hemorrhoids.  6. Diverticulosis.  7. Macular degeneration.  8. She is status post laser surgery 2 months ago for a bleeding vessel  in her eye, but has not had any further problems.   PAST SURGICAL HISTORY:  1. Status post hysterectomy.  2. Status post appendectomy.  3. Status post tonsillectomy.  4. Status post cyst removal from hands and left breast.   ALLERGIES:  PREDNISONE.   MEDICATIONS:  1. Fosamax 70 mg weekly.  2. Benicar 20 mg a day.  3. Plavix 75 mg a day.  4. Imdur 30 mg a day.  5. Toprol-XL 100 mg a day.  6. Multivitamin.  7. Lipitor 40 mg a day.  8. Aspirin 81 mg a day.  9. Calcium 600 plus D 1500 mg daily.  10.Fish oil 1200 mg a day.  11.Synthroid 100 mcg on Monday through Saturday, 50 mcg on Sunday.  12.PreserVision 2 tablets daily.   REVIEW OF SYSTEMS:  Other than what is stated in the HPI is negative.   PHYSICAL EXAMINATION:  VITAL SIGNS:  Heart rate 68.  GENERAL:  This is a well-developed, well-nourished white female in no  acute distress.  HEENT:  Benign.  NECK:  Supple without lymphadenopathy.  No carotid bruits.  Carotid  upstrokes are +2 bilaterally.  LUNGS:  Clear to auscultation throughout.  HEART:  Regular rate and rhythm.  No murmurs, rubs, or gallops.  Normal  S1 and S2.  ABDOMEN:  Soft, nontender, and nondistended.  Active bowel sounds.  No  hepatosplenomegaly, no  abdominal bruits.  Positive bowel sounds in all 4  quadrants.  EXTREMITIES:  No cyanosis, erythema, or edema.  Dorsalis pedis pulses +2  bilaterally.   LABORATORY DATA:  Sodium 137, potassium 4.1, chloride 101, bicarb 25,  BUN 21, and creatinine 0.91.  White cell count 6.1, hemoglobin 13.5,  hematocrit 39.8, and platelet count 202.  CPK/MB 1.9 and 2.0, troponin  less than 0.05 x2, lipase 37, total bili 1.3, direct bili 0.1, indirect  bili 1.2, alkaline phosphatase 60, AST 41, and ALT 34.  Chest x-ray  shows cardiomegaly and no active disease.  EKG shows sinus rhythm at 66  beats per minute with no ST changes.   ASSESSMENT:  1. Chest pain syndrome of questionable etiology.  The patient has not      had her cholecystectomy yet, but is scheduled in the next couple of      weeks.  Her chest pain now is different from the last admission      when she was diagnosed with cholelithiasis.  This chest pain is      located over her left breast and nitroglycerin did not help it      today, but her nitroglycerin tablets are also greater than a year      old.  Her bilirubin is slightly elevated today as well as her AST      and question whether this is related to her gallbladder.  She has      also had some improvement in her chest pain with belching.  2. Severe two-vessel coronary disease on medical management with no      ischemia on stress perfusion scan in August 2009.  3. Normal left ventricular function.  4. Cholelithiasis scheduled for surgery later on this month.   PLAN:  Admit to telemetry bed.  Rule out myocardial infarction with  serial enzymes.  We will start IV heparin drip.  I will not put her on  Lovenox since the last time she had Lovenox showed a very large  abdominal hematoma and is  still very tender over that area.  We  will start IV nitroglycerin drip  and titrate for chest pain as blood pressure allows.  We will keep her  n.p.o.  Start Nexium 40 mg a day.  Continue current  medications.  Further workup per Dr. Antoine Poche.      Armanda Magic, M.D.  Electronically Signed     TT/MEDQ  D:  06/23/2008  T:  06/23/2008  Job:  [id number]   cc:   Rollene Rotunda, MD, [facility]

## 2011-02-17 NOTE — Assessment & Plan Note (Signed)
[facility]                            CARDIOLOGY OFFICE NOTE   NAME:[name], [name]                      MRN:          [id number]  DATE:07/18/2008                            DOB:          1936-03-04    PRIMARY CARE PHYSICIAN:  Ernestina Penna, MD   REASON FOR PRESENTATION:  Evaluate the patient with chest pain and  coronary artery disease.   HISTORY OF PRESENT ILLNESS:  The patient was hospitalized on June 23, 2008, with chest pain.  As she was preop for a cholecystectomy, we  did want to make sure that she had had no change in her known coronary  artery disease.  We sent her for catheterization.  This demonstrated  that the EF was 65-70%.  Right coronary artery was dominant.  There was  long 99% stenosis starting in the proximal RCA.  There was an 80%  stenosis in the more distal RCA.  There was extensive left-to-right  collaterals.  The left main was normal.  Left circumflex had 100% distal  vessel.  There were left-to-left collaterals filling the distal vessel.  The LAD was occluded in the mid segment.  Distal LAD filled via left-to-  left collaterals from obtuse marginal to circumflex.  There were 2  patent diagonals.  There was 50% stenosis in the second diagonal and 50%  proximal LAD stenosis.  This was unchanged from her previous.  Therefore, she was discharged from the hospital June 26, 2008.  She  was cleared for surgery.  She has since had her gallbladder removed  laparoscopically.  York Spaniel it took her a while to start recovering from  this but she is now.  She is just now starting to walk.  With this level  of activity, she is not getting any chest pressure, neck or arm  discomfort.  She is not having any palpitation, presyncope, or syncope.  She is having no PND or orthopnea.   PAST MEDICAL HISTORY:  Coronary artery disease as described,  degenerative disk disease, rheumatoid arthritis, osteoporosis,  diverticulosis,  hemorrhoids, hysterectomy, appendectomy, cyst removed  from her hands and left breast, tonsillectomy.   ALLERGIES AND INTOLERANCES:  PREDNISONE.   MEDICATIONS:  1. Fosamax.  2. Benicar 20 mg daily.  3. Plavix 75 mg daily.  4. Lipitor 40 mg daily.  5. Imdur 30 mg daily.  6. Metoprolol 50 mg b.i.d.  7. Aspirin 81 mg daily.  8. Multivitamin.  9. Over-the-counter eye drops.  10.Calcium.  11.Fish oil.  12.Synthroid.  13.Flonase.  14.PreserVision.   REVIEW OF SYSTEMS:  As stated in the HPI, otherwise negative for other  systems.   PHYSICAL EXAMINATION:  GENERAL:  The patient is pleasant and in no  distress.  VITAL SIGNS:  Blood pressure 135/78, heart rate 74 and regular.  HEENT:  Eyes are unremarkable.  Pupils equal, round, and reactive to  light.  Fundi not visualized.  Oral mucosa unremarkable.  NECK:  No jugular venous distention at 45 degrees.  Carotid upstrokes  brisk and symmetrical.  No bruits, no thyromegaly.  LYMPHATICS:  No adenopathy.  LUNGS:  Clear to auscultation bilaterally.  BACK:  No costovertebral angle tenderness.  CHEST:  Unremarkable.  HEART:  PMI not displaced or sustained, S1 and S2 within normal limits.  No S3, no S4, no clicks, no rubs, no murmurs.  ABDOMEN:  Flat, positive bowel sounds, well-healed surgical scars, no  rebound, no guarding, no organomegaly.  SKIN:  No rashes, no nodules.  EXTREMITIES:  Pulses 2+, no edema.   EKG:  Sinus rhythm, rate 69, axis within normals, intervals within  normal limits, no acute ST-wave changes.   ASSESSMENT AND PLAN:  1. Coronary artery disease.  The patient is having no new symptoms.      Her anatomy is unchanged.  She is going to continue with aggressive      risk reduction.  2. Followup.  I will see her back in about 4 months or sooner if      needed.     Rollene Rotunda, MD, [facility]  Electronically Signed    JH/MedQ  DD: 07/18/2008  DT: 07/19/2008  Job #: [id number]   cc:   Ernestina Penna, M.D.

## 2011-02-17 NOTE — Assessment & Plan Note (Signed)
[facility]                            CARDIOLOGY OFFICE NOTE   NAME:Kryder, [name]                      MRN:          [id number]  DATE:05/08/2008                            DOB:          [birth year]    PRIMARY:  Ernestina Penna, MD   REASON FOR PRESENTATION:  The patient with recent hospitalization for  chest pain.   HISTORY OF PRESENT ILLNESS:  The patient was hospitalized from April 27, 2008 to April 29, 2008.  She had chest discomfort.  She subsequently was  found to have cholelithiasis and it was thought that she might have  passed a kidney stone.  She is being considered for possible  cholecystectomy and is due to see the surgeons back in their office  soon.  She said that since that hospitalization, she has had no further  chest discomfort, neck or arm discomfort.  She has had no palpitation,  presyncope, or syncope.  She has had no PND or orthopnea.  Prior to this  hospitalization, she was not doing as much exercise as she had  previously been doing.  She was recently diagnosed with macular  generation, had laser surgery.  She had not gotten yet back into the  pool since that time, though she done little walking.  She is actually  walking with a cane for balance.  This is helping her back pain.  With  this level of activity, she is not developing any cardiovascular  symptoms.   PAST MEDICAL HISTORY:  1. Coronary artery disease (left main normal, LAD proximal 25%      stenosis followed by 30% stenosis followed by 90% stenosis at the      first diagonal followed by total occlusion.  Second, septal      perforator, the first diagonal had 30% stenosis, the circumflex had      25% stenosis followed by 30% stenosis extending into the large mid      obtuse marginal, and a 40% stenosis before mid obtuse marginal.      The mid obtuse marginal had 30% stenosis, the right coronary artery      is dominant, but slender.  There is long proximal 95%  stenosis      followed by 99% stenosis followed by 80% stenosis.  The EF is      60%.).  2. Degenerative disk disease.  3. Rheumatoid arthritis.  4. Osteoporosis.  5. Diverticulosis.  6. Hemorrhoids.  7. Hysterectomy.  8. Appendectomy  9. Cyst removed from the hands and left breast.  10.Tonsillectomy.   ALLERGIES:  Intolerance to PREDNISONE.   MEDICATIONS:  1. Fosamax.  2. Benicar 20 mg daily.  3. Plavix 75 mg daily.  4. Imdur 30 mg daily.  5. Toprol 100 mg daily.  6. Multivitamin.  7. Lipitor 40 mg daily.  8. Aspirin 81 mg daily.  9. Calcium.  10.Fish oil.  11.Synthroid 100 mcg Monday to Saturday, 50 mcg Sunday.  12.Zantac 150 mg daily, preservation.   REVIEW OF SYSTEMS:  As stated in the HPI and otherwise negative for  other systems.  PHYSICAL EXAMINATION:  GENERAL:  The patient is in no distress.  VITAL SIGNS:  Blood pressure 150/73, heart rate 64 and regular, weight  161 pounds, body mass index 29.  HEENT:  Eyes unremarkable.  Pupils equal, round, and reactive to light.  Fundi not visualized.  Oral mucosa normal.  NECK:  No jugular venous distention 45 degrees, carotid upstroke brisk  and symmetric, and no bruits or thyromegaly.  LYMPHATICS:  No cervical, axillary, or inguinal adenopathy.  LUNGS:  Clear to auscultation bilaterally.  BACK:  No costovertebral angle tenderness.  CHEST:  Unremarkable.  HEART:  PMI not displaced or sustained.  S1 and S2 within normal limits,  no S3 or S4, and no clicks, no rubs, or no murmurs.  ABDOMEN:  Flat, positive bowel sounds, normal in frequency and pitch, no  bruits, rebound, guarding, or midline pulsatile mass.  No hepatomegaly  or splenomegaly.  SKIN:  No rashes.  EXTREMITIES:  2+ pulses throughout, no edema, no cyanosis, or no  clubbing.  NEURO:  Oriented to person, place, and time.  Cranial nerves II through  XII grossly intact.  Motor grossly intact.   EKG sinus rhythm, rate 56, axis within normal limits, intervals  within  normal limits, and no acute ST-wave changes.   ASSESSMENT AND PLAN:  1. Coronary disease.  The patient has no ongoing symptoms consist      with unstable angina.  Her symptoms have not changed since her      stress test in 2006.  At this point, she can continue with medical      management risk reduction.  Should she need to come off of Plavix      for any upcoming surgery, that would be reasonable.  She should      continue aspirin unless this is contraindicated with the surgery as      well.  2. Hypertension.  Blood pressure is elevated and it was apparently      slightly elevated in the hospital as well.  She had a blood      pressure cuff at home.  She is anxious when she comes to the      position.  At this point, I am going to have her keep a blood      pressure diary.  She can share these results with me and I      instructed her exactly how to do this.  I will most likely go up on      the Benicar should she have any pressures that are above target.  3. Dyslipidemia.  She had a lipid profile followed by Dr. Christell Constant and      has had excellent control.  4. Followup.  I will see her back in about January or sooner if      needed, but again show drop-off the blood pressure diary.     Rollene Rotunda, MD, [facility]  Electronically Signed    JH/MedQ  DD: 05/08/2008  DT: 05/09/2008  Job #: [id number]   cc:   Ernestina Penna, M.D.

## 2011-02-17 NOTE — Discharge Summary (Signed)
[name], GUEST NO.:  [id number]   MEDICAL RECORD NO.:  [id number]          PATIENT TYPE:  INP   LOCATION:  2007                         FACILITY:  [facility]   PHYSICIAN:  Doylene Canning. Ladona Ridgel, MD    DATE OF BIRTH:  August 08, 1936   DATE OF ADMISSION:  04/27/2008  DATE OF DISCHARGE:  04/29/2008                               DISCHARGE SUMMARY   PROCEDURE:  Ultrasound of the abdomen.   PRIMARY FINAL DISCHARGE DIAGNOSIS:  Chest pain, felt secondary to  biliary colic.   SECONDARY DIAGNOSES:  1. Mid left anterior descending 90%, first diagonal branch 30%,      circumflex 40%, obtuse marginal 30%, right coronary artery 99%, and      ejection fraction 60%, medical therapy recommended.  2. Numbness in her hands and face, treated with Plavix.  3. Disk disease.  4. History of rheumatoid arthritis.  5. Osteoporosis.  6. Diverticulosis.  7. Hemorrhoids.  8. Status post hysterectomy, appendectomy, tonsillectomy as well as      cyst removal.  9. Family history of coronary artery disease.  10.Allergy or intolerance to PREDNISONE.   TIME AT DISCHARGE:  40 minutes.   HOSPITAL COURSE:  [name] is a 75 year old female with a history of  coronary artery disease treated medically.  She had substernal and  epigastric chest pain, which she thought was heart burn.  She took  nitroglycerin, however and got some improvement.  She came to the  hospital and was admitted for further evaluation.   Her cardiac enzymes were negative for MI.  Her amylase was elevated at  133, but lipase was within normal limits.  Vitamin B12 was within normal  limits at 580.  Her SGOT and SGPT were slightly elevated at 48 and 40  respectively, but other CMET values were within normal limits.  A GI  consult was called because there was concern for a GI etiology to her  pain.   An abdominal ultrasound showed cholelithiasis and a dilated common duct,  most likely due to a nonvisualized distal common duct  stone.  She was  seen by Dr. Marina Goodell and started on an H2 blocker.  Dr. Marina Goodell felt that she  had probable biliary colic.  He recommended an H2 blocker.  By April 29, 2008, her pain had resolved.  It was felt that she probably passed a  stone.  A surgical consult was called for disposition and she was seen  by the surgery team.  [name] has had surgery by Dr. Derrell Lolling before  and wanted him to be the one to do any surgery that was needed.  Dr.  Ladona Ridgel felt that the Plavix could be stopped prior to surgery, if  needed.  [name] already has an appointment to follow up with Dr.  Derrell Lolling in August and is to discuss this with him at that time.  Of note,  Norvasc was added to her medication regimen because her blood pressure  was running between 140s and 160s during her acute illness.  The patient  prefers not to make any blood pressure medication  changes at this time.  She states she will track her blood pressure at home and bring those  readings in for Dr. Antoine Poche to review.  Any medication changes can then  be made as an outpatient.  Dr. Ladona Ridgel evaluated [name] on April 29, 2008.  Since she was not felt to need inpatient surgery this admission,  and her symptoms had resolved, he felt that she was stable for discharge  with outpatient followup arranged.   DISCHARGE INSTRUCTIONS:  1. Her activity level to be increased gradually.  2. She is to stick to a low-fat diet.  3. She is to follow up with Dr. Antoine Poche and our office will call her.  4. She is to keep her appointment with Dr. Derrell Lolling in August.  She is      to follow up with Dr. Christell Constant as needed.   DISCHARGE MEDICATIONS:  1. Zantac 150 mg daily.  2. Aspirin 81 mg a day.  3. Plavix 75 mg a day.  4. Isosorbide mononitrate 30 mg a day.  5. Synthroid 100 mcg daily.  6. Toprol-XL 100 mg a day.  7. Benicar 20 mg a day.  8. Lipitor 40 mg a day.      Theodore Demark, PA-C      Doylene Canning. Ladona Ridgel, MD  Electronically  Signed    RB/MEDQ  D:  04/29/2008  T:  04/30/2008  Job:  [id number]   cc:   Angelia Mould. Derrell Lolling, M.D.  Dr. Christell Constant.

## 2011-02-17 NOTE — Discharge Summary (Signed)
NAMEKENNY, STERN               ACCOUNT NO.:  [id number]   MEDICAL RECORD NO.:  [id number]          PATIENT TYPE:  INP   LOCATION:  3708                         FACILITY:  [facility]   PHYSICIAN:  Rollene Rotunda, MD, FACCDATE OF BIRTH:  12-07-1935   DATE OF ADMISSION:  06/23/2008  DATE OF DISCHARGE:  06/26/2008                               DISCHARGE SUMMARY   PRIMARY CARDIOLOGIST:  Rollene Rotunda, MD, [facility]   DISCHARGE DIAGNOSIS:  Chest pain.   SECONDARY DIAGNOSES:  1. Coronary artery disease.  2. Rheumatoid arthritis.  3. Osteoporosis.  4. Hemorrhoids.  5. Diverticulosis.  6. Macular degeneration.  7. Status post hysterectomy.  8. Cholelithiasis, pending cholecystectomy.  9. Status post appendectomy.  10.Status post tonsillectomy.   ALLERGIES:  PREDNISONE.   PROCEDURES:  Left heart cardiac catheterization.   HISTORY OF PRESENT ILLNESS:  This is a 75 year old female with prior  history of coronary artery disease who was in her usual state of health  until evening of June 22, 2008, at approximately 7:20 p.m.  She  developed a constant pressure in her left breast with a sharp combined  and no radiation.  She did have some mild associated shortness of  breath.  She presented to the [facility] ED after taking 3 sublingual  nitroglycerin tablets at home without relief.  In the ED, her point-of-  care cardiac markers were negative and ECG showed no acute ST-T changes.  The patient was admitted for further evaluation and rule-out.   HOSPITAL COURSE:  The patient was ruled out for MI by cardiac markers  x4.  Decision was made to pursue left heart cardiac catheterization.  She had no recurrent pain over the weekend and underwent left heart  cardiac catheterization on Monday, June 25, 2008, revealing  significant LAD and right coronary artery disease with collateralization  via the left circumflex and its branches.  EF of 65-70% with normal wall  motion.  Old films were  evaluated and it was noted that there was no  change in her coronary anatomy from previous catheterization.  Decision  was made to continue medical therapy.  Ms. Esham will be discharged  home today in good condition.  We will arrange followup with Dr.  Antoine Poche and we recommend that she be maintained on beta-blocker therapy  to the perioperative period for her cholecystectomy.   DISCHARGE LABORATORY DATA:  Hemoglobin 13.4, hematocrit 39.0, WBC 4.3,  and platelets 215.  Sodium 137, potassium 4.0, chloride 106, CO2 of 25,  BUN 70, creatinine 0.80, glucose 87, total bilirubin 1.3, and alkaline  phosphatase 57.  AST 34 and ALT 28.  Total protein 7.1, albumin 3.5,  calcium 7.8, magnesium 2.0, and lipase 37.  CK 68, MB 1.0, and troponin-  I 0.02.  Total cholesterol 157, triglycerides 42, HDL 64, and LDL 85.   DISPOSITION:  The patient is being discharged home today in good  condition.   FOLLOWUP PLANS AND APPOINTMENTS:  The patient will follow up with  General Surgery for cholecystectomy.  She is to follow up with Dr.  Antoine Poche on July 18, 2008, at 3:00  p.m.Marland Kitchen   DISCHARGE MEDICATIONS:  1. Aspirin 81 mg daily.  2. Plavix 75 mg daily.  3. Toprol-XL 100 mg daily.  4. Lipitor 40 mg daily.  5. Benicar 20 mg daily.  6. Synthroid 100 mcg daily except for Sundays 50 mcg.  7. Fluticasone nasal spray p.r.n.  8. PreserVision b.i.d.  9. Fish oil 1000 mg daily.  10.Calcium 600 mg plus D daily.  11.Multivitamin daily.  12.Imdur 30 mg daily.  13.Flomax 70 mg q. Week.  14.Nitroglycerin 0.4 mg sublingual p.r.n. chest pain.   OUTSTANDING LABORATORY DATA AND STUDIES:  None.   DURATION OF DISCHARGE ENCOUNTER:  60 minutes including physician time.      Nicolasa Ducking, ANP      Rollene Rotunda, MD, [facility]  Electronically Signed    CB/MEDQ  D:  06/26/2008  T:  06/27/2008  Job:  [id number]

## 2011-02-17 NOTE — Op Note (Signed)
[name], [name]               ACCOUNT NO.:  [id number]   MEDICAL RECORD NO.:  [id number]          PATIENT TYPE:  AMB   LOCATION:  DSC                          FACILITY:  [facility]   PHYSICIAN:  Angelia Mould. Derrell Lolling, M.D.DATE OF BIRTH:  [birth year]   DATE OF PROCEDURE:  05/13/2009  DATE OF DISCHARGE:                               OPERATIVE REPORT   PREOPERATIVE DIAGNOSIS:  Abnormal mammogram, right breast.   POSTOPERATIVE DIAGNOSIS:  Abnormal mammogram, right breast.   OPERATION PERFORMED:  Excision of right breast mass with needle  localization and specimen mammogram.   SURGEON:  Angelia Mould. Derrell Lolling, MD   OPERATIVE INDICATIONS:  This is a 75 year old Caucasian female who was  noted to have some increase in calcifications in the upper outer  quadrant of the right breast about 8 months ago.  She had a core biopsy  of this area by Dr. Tor Netters.  He asked Dr. Jeralyn Ruths to do this  and a biopsy was done of the area at the 12 o'clock position on the  right breast which showed extensive fibrosis and calcifications.  We  elected a 41-month followup.  When she had her follow mammograms on March 15, 2009, there was an area of increasing density at the biopsy site on  the right breast at the 12 o'clock position.  This was an area about 2.3  x 2.1 cm.  This was felt to be of concern.  Options for excisional  biopsy versus core biopsy were discussed with the patient and she  elected to have this excised.  I did evaluate her as an outpatient and  we set this up.   OPERATIVE TECHNIQUE:  The patient underwent wire localization by Dr.  Samuella Cota at the [facility] this morning and the wire went through  the mass.  The patient was brought to the holding area and the films  were reviewed.  The patient was taken to the operating room.  General  anesthesia was induced.  The patient's right breast was prepped and  draped in a sterile fashion.  Intravenous antibiotics were given.  The  patient  was identified as correct patient, correct procedure, and  correct site.  I reviewed the films one more time.  I noticed that the  wire was entering from medially and directed laterally.  Marcaine 0.5%  with epinephrine was used as a local infiltration anesthetic.   I made a curved circumareolar incision excising an ellipse of skin  superiorly.  Dissection was carried down and around the mass all the way  to the pectoralis fascia.  The lumpectomy specimen was removed and was  marked with the 6-color margin marker kit.  Dr. Samuella Cota reviewed this and  said that the density was in the center of the specimen and it looked  good.  This was sent for routine histology.   At the superior margin, there were some thicker tissues and I was  uncertain as to whether this could be some fibrocystic change or a  fibroadenoma and so I excised this and marked it once again with red ink  to mark the new superior margin.  This would be sent for routine  histology.  My suspicion is that this is totally benign.  The wound was  irrigated with saline.  Hemostasis was excellent and achieved with  electrocautery.  The deeper breast tissues were closed with interrupted  sutures of 3-0 Vicryl and skin was closed with running subcuticular  suture of 4-0 Monocryl and Steri-Strips.  Clean bandages were placed and  the patient was taken to the recovery room in stable condition.   ESTIMATED BLOOD LOSS:  About 10 mL.   COMPLICATIONS:  None.   SPONGE, NEEDLES, AND INSTRUMENT COUNTS:  Correct.      Angelia Mould. Derrell Lolling, M.D.  Electronically Signed     HMI/MEDQ  D:  05/13/2009  T:  05/14/2009  Job:  [id number]   cc:   Ernestina Penna, M.D.  Tera Mater. Evlyn Kanner, M.D.  Rollene Rotunda, MD, [facility]

## 2011-02-17 NOTE — Op Note (Signed)
[name], [name]               ACCOUNT NO.:  [id number]   MEDICAL RECORD NO.:  [id number]          PATIENT TYPE:  OIB   LOCATION:  5151                         FACILITY:  [facility]   PHYSICIAN:  Angelia Mould. Derrell Lolling, M.D.DATE OF BIRTH:  September 02, 1936   DATE OF PROCEDURE:  07/11/2008  DATE OF DISCHARGE:  06/26/2008                               OPERATIVE REPORT   PREOPERATIVE DIAGNOSIS:  Chronic cholecystitis with cholelithiasis.   POSTOPERATIVE DIAGNOSIS:  Chronic cholecystitis with cholelithiasis.   OPERATION PERFORMED:  Laparoscopic cholecystectomy with intraoperative  cholangiogram.   SURGEON:  Angelia Mould. Derrell Lolling, MD   FIRST ASSISTANT:  Ollen Gross. Vernell Morgans, MD   OPERATIVE INDICATIONS:  This is a 75 year old white female with coronary  artery disease.  She has been admitted twice over the past 3-4 months  for epigastric pain and bloating.  On both instances, she had to be  hospitalized and underwent cardiac evaluation to rule out myocardial  infarction.  On her last episode, she had a cardiac catheterization,  which looked okay.  She was known to have moderately severe right  coronary artery disease.  Her ejection fraction is 84%.  Gallbladder  ultrasound showed numerous small gallstones.  I evaluated her and I felt  that it was very likely that she was having biliary colic.  Cholecystectomy was advised and she is brought to the operating room  electively for that.   OPERATIVE FINDINGS:  The gallbladder was chronically inflamed.  There  were soft chronic adhesions to the entirety of the gallbladder, but  there was no evidence of acute inflammation.  The liver looked healthy.  The cholangiogram was normal, showing normal intrahepatic and  extrahepatic bile ducts, no filling defects, and no obstruction with  good flow of contrast into the duodenum.  There were some adhesions in  the lower midline from a previous hysterectomy and appendectomy, but  otherwise, the small and large  intestine looked normal.   OPERATIVE TECHNIQUE:  Following the induction of general endotracheal  anesthesia, the patient's abdomen was prepped and draped in a sterile  fashion.  Intravenous antibiotics were given.  The patient was  identified as the correct patient and correct procedure.  A 0.5%  Marcaine with epinephrine was used as local infiltration anesthetic.  A  vertically-oriented incision was made at the lower rim of the umbilicus.  The fascia was incised in the midline.  The abdominal cavity entered  under direct vision.  A 10-mm Hasson trocar was inserted and secured  with pursestring suture of 0 Vicryl.  Pneumoperitoneum was created.  Video camera was inserted with visualization and findings as described  above.  A 10-mm trocar was placed in the subxiphoid region, two 5-mm  trocars placed in the right upper quadrant.  We could identify the  gallbladder fundus and lifted this up.  We spent about 10-50 minutes,  slowly teasing the chronic soft adhesions off of the gallbladder until  we had all of the anatomy completely defined.  We identified the cystic  duct and the cystic artery.  We isolated the cystic artery as it went on  the wall of the gallbladder, secured it with multiple metal clips and  divided.  We created a large window behind the cystic duct.  A  cholangiogram catheter was inserted into the cystic duct.  A  cholangiogram was obtained using the C-arm and the cholangiogram was  normal as described above.  The cholangiogram catheter was removed, the  cystic duct was secured with multiple metal clips and divided.  The  gallbladder was dissected from its bed with electrocautery, placed into  a specimen bag and removed.  The operative field was copiously irrigated  with over 1000 mL of saline.  At the completion of the case, there was  no bleeding and no bile leak whatsoever and all of the irrigation fluid  was returned as clear.  The trocars were removed under direct  vision.  There was no bleeding from the trocar sites.  The pneumoperitoneum was  released.  The fascia at the umbilicus was closed with 0 Vicryl sutures.  The skin incisions were closed with subcuticular sutures of 4-0 Monocryl  and Dermabond.  Clean bandages were placed.  The patient was taken to  recovery room in stable condition.  Estimated blood loss was about 20  mL.  Complications none.  Sponge, needle, and instrument counts were  correct.      Angelia Mould. Derrell Lolling, M.D.  Electronically Signed     HMI/MEDQ  D:  07/11/2008  T:  07/11/2008  Job:  [id number]   cc:   Rollene Rotunda, MD, [facility]  Ernestina Penna, M.D.  Tera Mater. Evlyn Kanner, M.D.

## 2011-02-20 NOTE — Op Note (Signed)
NAMEBETHANEY, OSHANA               ACCOUNT NO.:  [id number]   MEDICAL RECORD NO.:  [id number]          PATIENT TYPE:  OIB   LOCATION:  5736                         FACILITY:  [facility]   PHYSICIAN:  Angelia Mould. Derrell Lolling, M.D.DATE OF BIRTH:  1936-06-14   DATE OF PROCEDURE:  03/08/2006  DATE OF DISCHARGE:                                 OPERATIVE REPORT   PREOPERATIVE DIAGNOSES:  1.  Right thyroid nodule.  2.  Question left thyroid nodule.   POSTOPERATIVE DIAGNOSES:  1.  Follicular adenoma right thyroid lobe.  2.  Hyperplastic nodule left thyroid lobe.  3.  Possible Hashimoto's thyroiditis.   OPERATIONS PERFORMED:  1.  Right thyroid lobectomy with frozen section.  2.  Excise nodule left thyroid inferior pole with frozen section.   SURGEON:  Angelia Mould. Derrell Lolling, M.D.   FIRST ASSISTANT:  Sandria Bales. Ezzard Standing, M.D.   OPERATIVE INDICATIONS:  This is a 75 year old white female who had a chest x-  ray and a CT scan for evaluation of her vague shadowing in the chest.  This  showed only scarring but a 3.3 cm mass in the right thyroid gland was  identified.  Ultrasound of the neck showed a 3 cm mass in the right inferior  thyroid which was solid and a somewhat indistinct 1.9 cm nodule in the  inferior left thyroid.  The radiologist felt that the nodule on the left was  benign.  The nodule on the right side was biopsied under ultrasound guidance  and this showed a follicular lesion or neoplasm and further tissue sampling  was recommended.  It is also notable that she had a nuclear medicine scan  which showed a 2 cm nodule in the right lobe of thyroid gland which was cold  by scan.  I have advised her to undergo right thyroid lobectomy and  evaluation of the left thyroid lobe, possible total thyroidectomy.  She is  brought to the hospital electively for that surgery.   OPERATIVE TECHNIQUE:  Following induction of general endotracheal  anesthesia, the patient was placed in reverse Trendelenburg  position with  the neck extended.  The neck was prepped and draped in sterile fashion.  The  patient was identified.  A curved transverse incision was made anteriorly in  the neck, centered on the midline about 2 cm above the suprasternal notch.  Dissection was carried down through the subcutaneous tissue and the  platysmal muscle.  Skin and platysma flaps were raised superiorly and  inferiorly and the self-retaining retractor was placed.  Strap muscles were  divided in the midline.  Large, smooth, somewhat soft mass of the right  thyroid lobe was actually quite anteriorly located.  We mobilized the  inferior pole of the right thyroid gland, some by blunt dissection.  Some  venous channels were controlled with metal clips and divided.  We then  dissected the strap muscles off of the superior and isolated the superior  pole vessels.  The superior thyroid artery was isolated, ligated in  continuity with 2-0 silk ties, a metal clip was placed superiorly above the  most superior tie  and then the thyroid artery was divided.  There was some  further tissue in the superior pole which we very carefully dissected away  preserving the superior parathyroid gland.  This was also controlled between  2-0 silk ties and metal clips and divided.  This mobilized the superior pole  quite nicely.  We dissected the rest of the thyroid gland from lateral to  medial.  The inferior thyroid artery was isolated as it went onto the  thyroid gland controlled with multiple metal clips and divided.  The middle  thyroid vein also was controlled with multiple metal clips and divided.  We  dissected the thyroid gland up off of the trachea inferiorly and as we went  superiorly, we identified the recurrent laryngeal nerve near the ligament of  Berry and preserved the recurrent laryngeal nerve.  We dissected the right  thyroid lobe and isthmus off of the trachea all the way to the left.  The  isthmus was very small and was  controlled with a single hemostat and the  thyroid lobe was amputated.  The isthmus was ligated with 3-0 Vicryl suture  ligature.   We then evaluated the left thyroid lobe and at the inferior pole of the left  thyroid lobe there was a firm nodule.  On further mobilization and exposure,  this looked like thyroid tissue.  It was not discolored but was quite firm  and discrete.  Inferiorly, there was a small vein which was controlled with  metal clips and divided.  This was then dissected away from the thyroid  tissue and sent to pathology.   The frozen section diagnosis of the right thyroid nodule was follicular  adenoma, no evidence of capsular or vascular invasion.  The frozen section  diagnosis of the left thyroid nodule showed hyperplastic nodule, no evidence  of malignancy, possible Hashimoto's thyroiditis.   Dr. Ezzard Standing and I did not feel that total thyroidectomy was indicated and we  left the majority of the left thyroid lobe in place.   All the areas of dissection were irrigated with saline.  We placed Surgicel  gauze in the bed of the thyroid on the right and on the cut surface of the  right thyroid on the left.  This was observed for at least 10 minutes and  was completely dry.  We left the Surgicel gauze in place.  The strap muscles  were closed in the midline with interrupted sutures of 3-0 Vicryl.  The  platysma muscle was closed with interrupted sutures of 3-0 Vicryl.  Skin  closed with a few skin staples and mostly Steri-Strips.  Clean bandages were  placed.  The patient was taken recovery in stable condition.   ESTIMATED BLOOD LOSS:  About 30 mL.   COMPLICATIONS:  None.   SPONGE AND INSTRUMENT COUNTS:  Correct.      Angelia Mould. Derrell Lolling, M.D.  Electronically Signed     HMI/MEDQ  D:  03/08/2006  T:  03/09/2006  Job:  [id number]   cc:   Ernestina Penna, M.D.  Fax: [phone]   Rollene Rotunda, M.D.  [phone] [address]  [address]  [place] [address]

## 2011-02-20 NOTE — Cardiovascular Report (Signed)
[name], [name]                         ACCOUNT NO.:  [id number]   MEDICAL RECORD NO.:  [id number]                   PATIENT TYPE:  OIB   LOCATION:  2899                                 FACILITY:  [facility]   PHYSICIAN:  Rollene Rotunda, M.D.                DATE OF BIRTH:  Jun 18, 1936   DATE OF PROCEDURE:  10/05/2003  DATE OF DISCHARGE:  08/23/2003                              CARDIAC CATHETERIZATION   PRIMARY CARE PHYSICIAN:  Ernestina Penna, M.D.   INDICATION:  Evaluate patient with abnormal Cardiolite and chest pain.  The  Cardiolite suggested an EF of 70% with evidence of septal, apical and  inferior ischemia.   PROCEDURE NOTE:  Left heart catheterization was performed via the right  femoral artery.  The artery was cannulated using an anterior wall puncture.  A 6 French arterial sheath was inserted via the modified Seldinger  technique.  Preformed Judkins and a pigtail catheter were utilized.  The  patient tolerated the procedure well and left the lab in stable condition.   RESULTS:   HEMODYNAMICS:  1. LV 151/12.  2. Aortic 147/54.   CORONARIES:  The left main was normal.  The LAD was heavily calcified.  There was proximal 25-30% stenosis involving a large first diagonal.  There  was a 90% lesion after the first diagonal before large second septal  perforator.  The vessel was then occluded completely after the second septal  perforator.  The apex did fill scantly via predominantly circumflex  collaterals, but also some septal collaterals.  The first diagonal was large  with proximal long 30% stenosis.  A circumflex in the AV groove had mid 25-  30% stenosis extending into a large mid obtuse marginal. There was a focal  40% stenosis before this mid obtuse marginal.  The mid obtuse marginal had  ostial long 30% stenosis.  The right coronary artery was a dominant though  slender vessel.  There was long proximal 95% stenosis followed by mid 99%  stenosis followed again in the  mid segment by 80% stenosis before PDA and  two small posterior laterals.  The distal RCA posterior laterals were seen  to fill with collaterals predominantly from the circumflex.   LEFT VENTRICULOGRAM:  Left ventriculogram was obtained in the RAO  projection.  The EF was 60% with normal wall motion.   CONCLUSION:  Severe two-vessel coronary artery disease.   PLAN:  At this point, I think the benefit of coronary artery bypass graft  without an obviously bypassable LAD (I cannot see the vessel clearly enough  to understand whether a LIMA could be attached) is probable less than  intensive medical management.  I have had a long discussion with the patient  and his wife about this.  Proceed with treating with beta blockers and high  dose statins.  Rollene Rotunda, M.D.    JH/MEDQ  D:  08/23/2003  T:  08/24/2003  Job:  [id number]   cc:   Ernestina Penna, M.D.  [address] [place]  [place] [address]  Fax: [phone]

## 2011-02-20 NOTE — Op Note (Signed)
[name], [name]                         ACCOUNT NO.:  [id number]   MEDICAL RECORD NO.:  [id number]                   PATIENT TYPE:  AMB   LOCATION:  DSC                                  FACILITY:  [facility]   PHYSICIAN:  Cindee Salt, M.D.                    DATE OF BIRTH:  05/14/1936   DATE OF PROCEDURE:  10/09/2003  DATE OF DISCHARGE:                                 OPERATIVE REPORT   PREOPERATIVE DIAGNOSIS:  Smith fracture, left wrist.   POSTOPERATIVE DIAGNOSIS:  Smith fracture, left wrist.   PROCEDURE:  Open reduction and internal fixation with bone graft using  allograft, left wrist.  Stryker plate was used.   SURGEON:  Cindee Salt, M.D.   ANESTHESIA:  General.   INDICATIONS:  The patient is a 75 year old female who has suffered a fall  and a Smith fracture of her wrist.  She underwent closed reduction.  She is  admitted now for open reduction and internal fixation.   DESCRIPTION OF PROCEDURE:  The patient was brought to the operating room  where a general anesthetic was carried out without difficulty.  She was  prepped using DuraPrep in the supine position, left arm free.  The fracture  was manipulated using image intensifier.  This was partially reduced.  The  limb was exsanguinated with an Esmarch bandage and tourniquet placed high on  the arm and was inflated to 220 mmHg.  A volar incision was made over the  flexor carpi radialis, down through subcutaneous tissue carried radially and  back ulnarly at the wrist, carried down through subcutaneous tissue.  Bleeders were electrocauterized.  Suture was carried between the flexor  carpi radialis and the radial artery.  The pronator quadratus was then  elevated off of the distal radius.  The fracture was immediately apparent.  This was reduced using the image intensifier.  A wide short volar Stryker  plate was then selected.  This was placed using the image intensifier for  position.  The sliding hole was then drilled,  measured.  This measured 12  mm.  This was then tightened.  Two guide pins were then placed distally just  proximal to the articular surface.  The distal pins were then placed after  localization using the guide pins.  These were placed as screws radially,  tacked to the ulnar side and measured between and 16 mm.  These were each  placed securely fixing the distal articular surface.  The proximal screws  were then inserted.  These measured 12 to 14 mm.  These were each fixed and  locked.  The intermediate screws were then placed as screws radially and  pegs ulnarly.  This firmly fixed the distal radius.  X-rays confirmed good  positioning of the fracture with return of length.  Slight volar tilt and  adequate fixation/stabilization.  The bone graft was then inserted after  irrigation.  Oblique x-rays revealed that none of the screws or pegs were  within the joint.  The wound was irrigated. The pronator quadratus was  closed with figure-of-eight 4-0 Vicryl sutures, the subcutaneous tissue with  4-0 Vicryl and the skin with interrupted 5-0 nylon sutures.  A sterile  compressive dressing and plumber's splint was applied.  The patient  tolerated the procedure well.  On inflation of the tourniquet, all fingers  were immediately pink.  She was taken to the recovery room for observation  in satisfactory condition.  She is discharged home to return to the [facility] in one week on Percocet and Keflex.                                              Cindee Salt, M.D.   Angelique Blonder  D:  10/09/2003  T:  10/09/2003  Job:  [id number]

## 2011-02-20 NOTE — Op Note (Signed)
[name], [name]               ACCOUNT NO.:  [id number]   MEDICAL RECORD NO.:  [id number]          PATIENT TYPE:  OIB   LOCATION:  5740                         FACILITY:  [facility]   PHYSICIAN:  Angelia Mould. Derrell Lolling, M.D.DATE OF BIRTH:  08-Jun-1936   DATE OF PROCEDURE:  04/21/2006  DATE OF DISCHARGE:                                 OPERATIVE REPORT   PREOPERATIVE DIAGNOSIS:  1.  Papillary carcinoma of the thyroid.  2.  Status post right thyroid lobectomy.   POSTOPERATIVE DIAGNOSIS:  1.  Papillary carcinoma of the thyroid.  2.  Status post right thyroid lobectomy.   OPERATION PERFORMED:  Completion thyroidectomy (left thyroid lobectomy).   SURGEON:  Angelia Mould. Derrell Lolling, M.D.   FIRST ASSISTANT:  Rose Phi. Maple Hudson, M.D.   OPERATIVE INDICATIONS:  This a 75 year old white female who underwent a  right thyroid lobectomy for a solid mass in the right thyroid lobe and a  small nodule at the isthmus.  Intraoperative frozen section diagnosis was  follicular adenoma.  No evidence of cancer. The final diagnosis showed a  papillary thyroid carcinoma, follicular variant, 2.8 cm in the right lobe,  and a hyperplastic nodule on the left.  The patient was advised to have a  completion thyroidectomy.  She is brought to the operating room in 6 weeks  following her original surgery.   OPERATIVE TECHNIQUE:  Following the induction of general endotracheal  anesthesia, the patient's neck was extended with a small roll behind her  shoulders; and she was placed in a slightly reversed Trendelenburg position.  The neck was prepped and draped in a sterile fashion.  The curved transverse  thyroidectomy incision was reopened.  Dissection was carried down through  the subcutaneous tissue; and through the platysma muscle until we entered a  small seroma cavity, between the platysmal layer and the strap muscles.  Scarring was fairly intense, but we were able to divide the strap muscles in  the midline and dissect  them off of the left thyroid lobe.  There were some  small veins holding the inferior pole of the thyroid lobe which were  controlled with metal clips and divided.   We isolated the superior thyroid artery after skeletonizing the superior  pole.  We ligated the superior thyroid artery in continuity with 2-0 silk  ties and placed a medium metal clip above the most superior tie for  security; and then divided the superior pole vessels.  We mobilized the  superior pole down.  We then dissected the bulk of the thyroid gland from  lateral to medial.  We divided the middle thyroid vein and inferior thyroid  artery between metal clips.  There was a small whitish vascular structure  entering the thyroid gland.  We traced this out very carefully and saw that  it went into the parenchyma of the gland; and then felt this was not a  neurogenic structure.  We identified, what we thought was the recurrent  laryngeal nerve in the tracheoesophageal groove; and coming anterior and  going into the trachea just below the cricoid cartilage.  We felt that  we  had preserved the recurrent laryngeal nerve.  We clearly identified the  superior parathyroid gland.  That was preserved.  We mobilized the remainder  of the thyroid lobe from lateral to medial off of the trachea and sent it  for routine histology.   The wound was irrigated with saline.  Hemostasis was excellent; and had been  achieved mostly with metal clips and a little bit of electrocautery.  I  placed a couple pieces of Surgicel gauze in the bed of the thyroid and  observed this for about 5 minutes; and there was no bleeding.  We closed the  strap muscles with interrupted sutures of 3-0 Vicryl.  Platysmal muscle was  closed with 3-0 Vicryl sutures; and the skin closed with skin staples and  Steri-Strips.  Clean bandages were placed; and the patient taken to the  recovery room in stable condition.   ESTIMATED BLOOD LOSS:  About 25 mL.    COMPLICATIONS:  None.   SPONGE NEEDLE AND INSTRUMENT COUNTS:  Correct      Angelia Mould. Derrell Lolling, M.D.  Electronically Signed     HMI/MEDQ  D:  04/21/2006  T:  04/21/2006  Job:  [id number]   cc:   Ernestina Penna, M.D.  Fax: [phone]   Rollene Rotunda, M.D.  [phone]  [address]  [place]  [place] [address]

## 2011-02-20 NOTE — Letter (Signed)
June 01, 2008    Angelia Mould. Derrell Lolling, M.D.  [address]  [place], [place] [address]   RE:  Alyssa, [name]  MRN:  [id number]  /  DOB:  1936/06/21   Dear Mikey Bussing,   I have been following [name] for her known coronary disease.  She  does have high-grade right coronary artery disease that we have been  managing medically.  Her ejection fraction has been well preserved.  She  has not been particularly active recently and is due to have gallbladder  surgery.  I felt it prudent to screen her with a stress perfusion study.  I was happy to see that this demonstrated the EF to be 84%.  There was  no evidence of ischemia or infarct.   Based on this, the patient is at acceptable risk for the planned  surgery.  If you have any questions, please do not hesitate to give me a  call.  My cell phone number is [phone].    Sincerely,      Rollene Rotunda, MD, [facility]  Electronically Signed    JH/MedQ  DD: 06/01/2008  DT: 06/02/2008  Job #: [id number]

## 2011-02-20 NOTE — Assessment & Plan Note (Signed)
[facility]                              CARDIOLOGY OFFICE NOTE   NAME:[name], [name]                      MRN:          [id number]  DATE:06/10/2006                            DOB:          02-19-1936    REASON FOR PRESENTATION:  Coronary disease.   HISTORY OF PRESENT ILLNESS:  The patient returns for her 6 month followup.  Since I saw her, she has been treated for papillary thyroid cancer with  excision and radio-iodine. She did well with this from a cardiovascular  standpoint. She is now back on her previous medications. She is now starting  to walk a little bit, getting back into her routine. She has not started  water aerobics yet. With her level of activity, she denies any chest  discomfort, neck discomfort, arm discomfort, activity induced nausea or  vomiting, or diaphoresis. She has no palpitations, pre-syncope, or syncope.  She has had no PND or orthopnea.   PAST MEDICAL HISTORY:  Coronary artery disease (see the June 25, 2005  note for details.) Well preserved ejection fraction, numbness of her face  and hands treated with Plavix. Disk disease, rheumatoid arthritis,  osteoporosis, diverticulosis, hemorrhoids, papillary thyroid carcinoma,  hysterectomy, appendectomy, cyst removed from hands and left breast,  tonsillectomy.   ALLERGIES:  PREDNISONE.   MEDICATIONS:  Fish oil, Fosamax 70 mg a week, Benicar 20 mg daily, Plavix 75  mg daily, Imdur 30 mg daily, Toprol XL 100 mg daily, multivitamin, prenatal  eye drops, Lipitor 40 mg daily, Zetia 10 mg daily, aspirin 81 mg daily.   REVIEW OF SYSTEMS:  As stated in the HPI and otherwise, normal other  systems.   PHYSICAL EXAMINATION:  GENERAL:  The patient is in no distress.  VITAL SIGNS:  Blood pressure 119/73, heart 63 and regular, body mass index  27, weight 159 pounds.  HEENT:  Eyes unremarkable. Pupils are equal, round, and reactive to light.  Fundi not visualized.  NECK:  No  jugular venous distention. Wave form within normal limits. Carotid  upstroke brisk and symmetric. No bruits, no thyromegaly.  LYMPHATICS:  Normal.  LUNGS:  Clear to auscultation bilaterally.  BACK:  No costovertebral angle tenderness.  CHEST:  Unremarkable.  HEART:  PMI not displaced or sustained. S1 and S2 within normal limits. No  S3, S4, or murmurs.  ABDOMEN:  Flat, positive bowel sounds. Normal in frequency and pitch. No  bruits, no rebound, no guarding. No midline pulsatile mass, or clinical  organomegaly.  SKIN:  No rashes.  EXTREMITIES:  2+ pulses, no edema.   LABORATORY DATA:  EKG:  Sinus rhythm. Rate 61. Axis within normal limits.  Intervals within normal limits. No acute ST wave changes.   ASSESSMENT:  1. CORONARY DISEASE:  The patient is having no ongoing symptoms. No      further cardiovascular testing is suggested. She will continue with      medications as listed.  2. HYPERTENSION:  Blood pressure is well controlled. She will continue the      medications as listed.  3. DYSLIPIDEMIA:  This is followed by  Dr. Christell Constant and she has an excellent      lipid profile.   FOLLOWUP:  We will see her back in 6 months and then, perhaps yearly, if she  is doing well.                               Rollene Rotunda, MD, [facility]    JH/MedQ  DD:  06/10/2006  DT:  06/10/2006  Job #:  [id number]   cc:   Ernestina Penna, M.D.

## 2011-02-20 NOTE — Assessment & Plan Note (Signed)
[name]                            CARDIOLOGY OFFICE NOTE   NAME:Brady, [name]                      MRN:          [id number]  DATE:12/09/2006                            DOB:          03/21/1936    PRIMARY CARE PHYSICIAN:  Dr. Ernestina Penna.   REASON FOR PRESENTATION:  Evaluate patient with coronary disease.   HISTORY OF PRESENT ILLNESS:  The patient returns for 6 month followup.  She is now 75 years old.  She has done well since I last saw her.  She  has had no chest discomfort, neck or arm discomfort.  She has no  palpitations, presyncope, or syncope.  She has had no PND or orthopnea.  She is bothered by back pain and is due to see the neurosurgeon at  [facility].   PAST MEDICAL HISTORY:  1. Coronary artery disease (Left main normal, LAD proximal 25% is      followed by 30% stenosis followed by 90% stenosis after the first      diagonal, followed by total occlusion at the second septal      perforator, the first diagonal had 30% stenosis, the circumflex had      25% stenosis followed by 30% stenosis extending into a large mid      obtuse marginal and a 40% stenosis before the mid obtuse marginal.      The mid obtuse marginal had 30 % stenosis, the right coronary      artery was dominant but slender.  There was long proximal 95%      stenosis followed by mid 99% stenosis, followed by 80% stenosis,      the EF was 60 %.).  2. Numbness in her face and hands, treated with Plavix.  3. Disk disease.  4. Rheumatoid arthritis.  5. Osteoporosis.  6. Diverticulosis.  7. Hemorrhoids.  8. Hysterectomy.  9. Appendectomy.  10.Had a cyst removed from hands and left breast.  11.Tonsillectomy.   __________  1. Fish oil.  2. Fosamax.  3. Benicar 20 mg daily.  4. Plavix 75 mg daily.  5. Imdur 30 mg daily.  6. Toprol 100 mg daily.  7. Multivitamin.  8. Patanol eye drops.  9. Lipitor 40 mg daily.  10.Zetia 10 mg daily.  11.Aspirin 81 mg  daily.   REVIEW OF SYSTEMS:  As stated a stated in the HPI and otherwise negative  for other systems.   PHYSICAL EXAMINATION:  The patient is in no distress.  Blood pressure  129/77, heart rate 76 and regular, weight 159 pounds, body mass index  27.  HEENT:  Eyelids unremarkable, pupils equally round, and reactive to  light, fundi not visualized, oral mucosa unremarkable.  NECK:  No jugular venous distention at 45 degrees, carotid upstroke  brisk and symmetrical, no bruits, no thyromegaly.  LYMPHATICS:  No cervical, axillary, inguinal adenopathy.  LUNGS:  Clear to auscultation bilaterally.  BACK:  No costovertebral angle tenderness.  CHEST:  Unremarkable.  HEART:  PMI not displaced or sustained, S1 and S2 within normal limits,  no S3, no S4, no clicks,  no rubs, no murmurs.  ABDOMEN:  Flat, positive bowel sounds normal in frequency and pitch, no  bruits, no rebound, no guarding, no midline pulsatile mass, no  hepatomegaly, splenomegaly.  SKIN:  No rashes, no nodules.  EXTREMITIES:  With 2+ pulses, no edema.   ASSESSMENT/PLAN:  1. Coronary disease.  The patient is having no symptoms related to      this.  No further cardiovascular testing is suggested.  She will      continue with aggressive risk reduction.  2. Dyslipidemia.  This is followed by Dr. Vernon Prey and she is having      aggressive management.  3. Followup.  She is going to be seen in 1 year.  At that point I      would like to order a carotid ultrasound as she said she had some      mild plaque a few years back.  This ought to be followed up.     Rollene Rotunda, MD, [facility]  Electronically Signed    JH/MedQ  DD: 12/09/2006  DT: 12/09/2006  Job #: [id number]   cc:   Ernestina Penna, M.D.

## 2011-02-24 ENCOUNTER — Encounter: Payer: Self-pay | Admitting: *Deleted

## 2011-03-05 ENCOUNTER — Other Ambulatory Visit: Payer: Self-pay | Admitting: Cardiology

## 2011-03-05 DIAGNOSIS — I6529 Occlusion and stenosis of unspecified carotid artery: Secondary | ICD-10-CM

## 2011-03-06 ENCOUNTER — Ambulatory Visit (INDEPENDENT_AMBULATORY_CARE_PROVIDER_SITE_OTHER): Payer: Medicare Other | Admitting: Cardiology

## 2011-03-06 ENCOUNTER — Other Ambulatory Visit: Payer: Self-pay | Admitting: *Deleted

## 2011-03-06 ENCOUNTER — Encounter: Payer: Self-pay | Admitting: Cardiology

## 2011-03-06 ENCOUNTER — Encounter (INDEPENDENT_AMBULATORY_CARE_PROVIDER_SITE_OTHER): Payer: Medicare Other | Admitting: *Deleted

## 2011-03-06 VITALS — BP 138/68 | HR 53 | Resp 18 | Ht 63.0 in | Wt 144.0 lb

## 2011-03-06 DIAGNOSIS — I10 Essential (primary) hypertension: Secondary | ICD-10-CM

## 2011-03-06 DIAGNOSIS — I6529 Occlusion and stenosis of unspecified carotid artery: Secondary | ICD-10-CM

## 2011-03-06 DIAGNOSIS — I2510 Atherosclerotic heart disease of native coronary artery without angina pectoris: Secondary | ICD-10-CM

## 2011-03-06 DIAGNOSIS — E785 Hyperlipidemia, unspecified: Secondary | ICD-10-CM

## 2011-03-06 NOTE — Progress Notes (Signed)
HPI The patient presents for followup of her coronary artery disease. Since I last saw her she has done quite well. She denies any ongoing symptoms such as chest discomfort, neck or arm discomfort. She has no palpitations, presyncope or syncope. She has had no weight gain or edema. She denies any shortness of breath, PND or orthopnea. She walks a mile daily.  Allergies  Allergen Reactions  . Hydrocodone   . Prednisone     Current Outpatient Prescriptions  Medication Sig Dispense Refill  . Acetaminophen (TYLENOL PO) Take by mouth.        Marland Kitchen aspirin 81 MG tablet Take 81 mg by mouth daily.        Marland Kitchen atorvastatin (LIPITOR) 40 MG tablet Take 40 mg by mouth daily.        . Calcium Carbonate-Vitamin D (CALCIUM + D PO) Take by mouth daily.        . clopidogrel (PLAVIX) 75 MG tablet Take 75 mg by mouth daily.        . fish oil-omega-3 fatty acids 1000 MG capsule Take 2 g by mouth daily.        . fluticasone (FLONASE) 50 MCG/ACT nasal spray Place 2 sprays into the nose daily.        . isosorbide mononitrate (IMDUR) 30 MG 24 hr tablet Take 30 mg by mouth daily.        Marland Kitchen levothyroxine (SYNTHROID) 100 MCG tablet Take 100 mcg by mouth daily.        Marland Kitchen losartan (COZAAR) 25 MG tablet Take 25 mg by mouth daily.        . metoprolol (TOPROL-XL) 50 MG 24 hr tablet Take 50 mg by mouth 3 (three) times daily.        . Multiple Vitamins-Minerals (OCUVITE PRESERVISION PO) Take by mouth 2 (two) times daily.        . multivitamin (THERAGRAN) per tablet Take 1 tablet by mouth daily.        . nitroGLYCERIN (NITROSTAT) 0.4 MG SL tablet Place 0.4 mg under the tongue every 5 (five) minutes as needed.        [name] Sulfate (EYE DROPS A/C OP) Apply to eye.          Past Medical History  Diagnosis Date  . Osteoporosis   . CAD, UNSPECIFIED SITE   . CAROTID STENOSIS   . DIVERTICULAR DISEASE   . Essential hypertension, benign   . HYPERLIPIDEMIA   . MACULAR DEGENERATION   . OSTEOPOROSIS   . Rheumatoid  arthritis     Past Surgical History  Procedure Date  . Appendectomy   . Tonsillectomy   . Hysterectomy unspecified   . Im nailing femoral shaft retrograde     ROS:  As stated in the HPI and negative for all other systems.  PHYSICAL EXAM BP 138/68  Pulse 53  Resp 18  Ht 5\' 3"  (1.6 m)  Wt 144 lb (65.318 kg)  BMI 25.51 kg/m2 GENERAL:  Well appearing HEENT:  Pupils equal round and reactive, fundi not visualized, oral mucosa unremarkable NECK:  No jugular venous distention, waveform within normal limits, carotid upstroke brisk and symmetric, no bruits, no thyromegaly LYMPHATICS:  No cervical, inguinal adenopathy LUNGS:  Clear to auscultation bilaterally BACK:  No CVA tenderness CHEST:  Unremarkable HEART:  PMI not displaced or sustained,S1 and S2 within normal limits, no S3, no S4, no clicks, no rubs, no murmurs ABD:  Flat, positive bowel sounds normal in frequency in pitch, no  bruits, no rebound, no guarding, no midline pulsatile mass, no hepatomegaly, no splenomegaly EXT:  2 plus pulses throughout, no edema, no cyanosis no clubbing SKIN:  No rashes no nodules NEURO:  Cranial nerves II through XII grossly intact, motor grossly intact throughout PSYCH:  Cognitively intact, oriented to person place and time   EKG:  Sinus rhythm, rate 53, axis within normal limits, intervals within normal limits, no acute ST-T wave changes.   ASSESSMENT AND PLAN

## 2011-03-06 NOTE — Assessment & Plan Note (Signed)
I will defer to Julian Hy, MD

## 2011-03-06 NOTE — Assessment & Plan Note (Signed)
The blood pressure is at target. No change in medications is indicated. We will continue with therapeutic lifestyle changes (TLC).  

## 2011-03-06 NOTE — Assessment & Plan Note (Signed)
The patient has no new sypmtoms.  No further cardiovascular testing is indicated.  We will continue with aggressive risk reduction and meds as listed.  

## 2011-03-06 NOTE — Patient Instructions (Signed)
Continue current medications Follow up with Dr Antoine Poche in 12 months

## 2011-03-06 NOTE — Assessment & Plan Note (Signed)
She had this checked to day and the stenosis is less than 60% bilateral.

## 2011-03-11 ENCOUNTER — Encounter: Payer: Self-pay | Admitting: Cardiology

## 2011-03-13 ENCOUNTER — Telehealth: Payer: Self-pay | Admitting: Cardiology

## 2011-03-13 NOTE — Telephone Encounter (Signed)
Results of carotid U/S given to pt--nt

## 2011-03-13 NOTE — Telephone Encounter (Signed)
Pt rtn call from yesterday to obtain test results

## 2011-07-03 LAB — COMPREHENSIVE METABOLIC PANEL
ALT: 40 — ABNORMAL HIGH
AST: 47 — ABNORMAL HIGH
Albumin: 3.7
BUN: 16
CO2: 26
Calcium: 8.5
Calcium: 8.9
Creatinine, Ser: 0.94
Creatinine, Ser: 0.97
GFR calc Af Amer: >60
GFR calc non Af Amer: 57 — ABNORMAL LOW
Glucose, Bld: 107 — ABNORMAL HIGH
Sodium: 138
Total Protein: 7.3

## 2011-07-03 LAB — CK TOTAL AND CKMB (NOT AT ARMC)
CK, MB: 1.2
CK, MB: 1.9
Relative Index: INVALID
Relative Index: INVALID
Relative Index: INVALID
Total CK: 79
Total CK: 90

## 2011-07-03 LAB — POCT CARDIAC MARKERS
Myoglobin, poc: 88.3
Operator id: 133351

## 2011-07-03 LAB — HEPATIC FUNCTION PANEL
Bilirubin, Direct: 0.2
Indirect Bilirubin: 0.9
Total Bilirubin: 1.1

## 2011-07-03 LAB — DIFFERENTIAL
Basophils Absolute: 0
Lymphocytes Relative: 17
Neutro Abs: 3.5

## 2011-07-03 LAB — PROTIME-INR: Prothrombin Time: 13.1

## 2011-07-03 LAB — POCT I-STAT, CHEM 8
HCT: 41
Hemoglobin: 13.9
Sodium: 136
TCO2: 27

## 2011-07-03 LAB — CBC
Hemoglobin: 13.6
Platelets: 198
RDW: 13.2

## 2011-07-03 LAB — AMYLASE: Amylase: 133 — ABNORMAL HIGH

## 2011-07-03 LAB — VITAMIN B12: Vitamin B-12: 580 (ref 211–911)

## 2011-07-03 LAB — APTT: aPTT: 36

## 2011-07-03 LAB — TROPONIN I: Troponin I: <0.01

## 2011-07-06 LAB — CARDIAC PANEL(CRET KIN+CKTOT+MB+TROPI)
CK, MB: 1
CK, MB: 1.3
Relative Index: INVALID
Relative Index: INVALID
Relative Index: INVALID
Total CK: 68
Total CK: 77
Troponin I: 0.01
Troponin I: 0.02
Troponin I: 0.02

## 2011-07-06 LAB — COMPREHENSIVE METABOLIC PANEL
ALT: 28
ALT: 29
AST: 34
AST: 40 — ABNORMAL HIGH
Albumin: 3.5
Alkaline Phosphatase: 57
BUN: 15
CO2: 27
Chloride: 103
Chloride: 99
Creatinine, Ser: 1.04
GFR calc Af Amer: >60
GFR calc non Af Amer: 53 — ABNORMAL LOW
Potassium: 4
Sodium: 138
Sodium: 133 — ABNORMAL LOW
Total Bilirubin: 1.1
Total Bilirubin: 0.6
Total Protein: 7.1

## 2011-07-06 LAB — BASIC METABOLIC PANEL
BUN: 12
BUN: 9
CO2: 25
CO2: 26
CO2: 29
Calcium: 7.8 — ABNORMAL LOW
Calcium: 8 — ABNORMAL LOW
Calcium: 8.5
Calcium: 9.5
Chloride: 100
Chloride: 102
Creatinine, Ser: 0.77
Creatinine, Ser: 0.8
Creatinine, Ser: 0.84
Creatinine, Ser: 0.91
GFR calc Af Amer: >60
GFR calc Af Amer: >60
GFR calc Af Amer: >60
GFR calc Af Amer: >60
GFR calc non Af Amer: >60
GFR calc non Af Amer: >60
GFR calc non Af Amer: >60
GFR calc non Af Amer: >60
Glucose, Bld: 102 — ABNORMAL HIGH
Glucose, Bld: 91
Potassium: 3.4 — ABNORMAL LOW
Potassium: 4.3
Sodium: 135
Sodium: 137
Sodium: 138

## 2011-07-06 LAB — POCT CARDIAC MARKERS
CKMB, poc: 1.9
CKMB, poc: 2

## 2011-07-06 LAB — HEPARIN LEVEL (UNFRACTIONATED)
Heparin Unfractionated: 0.47
Heparin Unfractionated: 0.67
Heparin Unfractionated: 0.87 — ABNORMAL HIGH

## 2011-07-06 LAB — DIFFERENTIAL
Basophils Absolute: 0
Basophils Absolute: 0.1
Basophils Relative: 1
Eosinophils Absolute: 0.1
Eosinophils Relative: 2
Lymphocytes Relative: 10 — ABNORMAL LOW
Monocytes Absolute: 0.6
Monocytes Relative: 11
Neutro Abs: 4.8
Neutrophils Relative %: 79 — ABNORMAL HIGH

## 2011-07-06 LAB — URINALYSIS, ROUTINE W REFLEX MICROSCOPIC
Glucose, UA: NEGATIVE
Hgb urine dipstick: NEGATIVE
Ketones, ur: NEGATIVE
Protein, ur: NEGATIVE

## 2011-07-06 LAB — LIPID PANEL
LDL Cholesterol: 85
Total CHOL/HDL Ratio: 2.5
Triglycerides: 42
VLDL: 8

## 2011-07-06 LAB — CBC
HCT: 37.2
HCT: 40.4
Hemoglobin: 12.7
Hemoglobin: 13.5
Hemoglobin: 13.7
MCHC: 34.2
MCHC: 34.3
MCHC: 34.5
MCHC: 33.9
MCV: 95.1
MCV: 95.4
Platelets: 178
Platelets: 192
Platelets: 271
RBC: 3.91
RBC: 4.17
RBC: 4.22
RBC: 4.24
RDW: 12.5
RDW: 12.5
RDW: 12.5
WBC: 5.4
WBC: 4.9

## 2011-07-06 LAB — PROTIME-INR
INR: 1
Prothrombin Time: 13.1

## 2011-07-06 LAB — CK TOTAL AND CKMB (NOT AT ARMC)
CK, MB: 1.3
Total CK: 68

## 2011-07-06 LAB — TROPONIN I: Troponin I: 0.01

## 2011-07-06 LAB — HEPATIC FUNCTION PANEL
ALT: 34
AST: 41 — ABNORMAL HIGH
Albumin: 3.8
Bilirubin, Direct: 0.1
Total Bilirubin: 1.3 — ABNORMAL HIGH

## 2011-07-06 LAB — URINE MICROSCOPIC-ADD ON

## 2011-07-15 ENCOUNTER — Other Ambulatory Visit (HOSPITAL_COMMUNITY): Payer: Self-pay | Admitting: Orthopedic Surgery

## 2011-07-15 DIAGNOSIS — M8430XA Stress fracture, unspecified site, initial encounter for fracture: Secondary | ICD-10-CM

## 2011-07-23 ENCOUNTER — Encounter (HOSPITAL_COMMUNITY)
Admission: RE | Admit: 2011-07-23 | Discharge: 2011-07-23 | Disposition: A | Discharge status: Home / Self Care | Payer: Medicare Other | Source: Ambulatory Visit | Attending: Orthopedic Surgery | Admitting: Orthopedic Surgery

## 2011-07-23 ENCOUNTER — Encounter (HOSPITAL_COMMUNITY): Payer: Self-pay

## 2011-07-23 DIAGNOSIS — M25559 Pain in unspecified hip: Secondary | ICD-10-CM | POA: Insufficient documentation

## 2011-07-23 DIAGNOSIS — M8430XA Stress fracture, unspecified site, initial encounter for fracture: Secondary | ICD-10-CM

## 2011-07-23 DIAGNOSIS — M810 Age-related osteoporosis without current pathological fracture: Secondary | ICD-10-CM | POA: Insufficient documentation

## 2011-07-23 DIAGNOSIS — IMO0002 Reserved for concepts with insufficient information to code with codable children: Secondary | ICD-10-CM | POA: Insufficient documentation

## 2011-07-23 MED ORDER — TECHNETIUM TC 99M MEDRONATE IV KIT
24.7000 | PACK | Freq: Once | INTRAVENOUS | Status: AC | PRN
  Administered 2011-07-23: 24.7 via INTRAVENOUS

## 2011-08-05 ENCOUNTER — Telehealth: Payer: Self-pay | Admitting: Cardiology

## 2011-08-05 NOTE — Telephone Encounter (Signed)
Ok to be off the Plavix as needed for injections.

## 2011-08-05 NOTE — Telephone Encounter (Signed)
Please advise 

## 2011-08-05 NOTE — Telephone Encounter (Signed)
Per Shary Key,. Pt need to be off plavix 7 day prior to lumbar injection  On 11/9.

## 2011-08-06 NOTE — Telephone Encounter (Signed)
Per Shary Key, calling regarding stop Plavix. Need an answer today. Fax # [phone].

## 2011-08-06 NOTE — Telephone Encounter (Signed)
Note faxed to number listed.

## 2011-09-16 ENCOUNTER — Telehealth: Payer: Self-pay | Admitting: Cardiology

## 2011-09-16 NOTE — Telephone Encounter (Signed)
Notified that Dr Antoine Poche is out of the office today and that message would be forwarded to his nurse.

## 2011-09-16 NOTE — Telephone Encounter (Signed)
New message:  A form had been faxed for the d/c of Plavix.  Was suppose to be faxed back yesterday and they have not received as yet.  Please call Jae Dire at [phone] and advise.

## 2011-09-16 NOTE — Telephone Encounter (Signed)
Paperwork completed and faxed to number given

## 2011-10-08 ENCOUNTER — Telehealth: Payer: Self-pay | Admitting: Cardiology

## 2011-10-08 NOTE — Telephone Encounter (Signed)
Form was faxed 09/16/11.  Left message for Judeth Cornfield to check with Jae Dire and to call back if they can't find it.

## 2011-10-08 NOTE — Telephone Encounter (Signed)
New problem:  Per Judeth Cornfield,  Patient need come off plavix 7 days prior due to steriod injection.

## 2011-10-08 NOTE — Telephone Encounter (Signed)
Per schedulers - office called back and said they need a new approval to hold Plavix for 7 days for steroid injection.  Aware Dr Antoine Poche not in this week.

## 2011-10-12 ENCOUNTER — Encounter: Payer: Self-pay | Admitting: *Deleted

## 2011-10-12 NOTE — Telephone Encounter (Signed)
OK to hold Plavix as needed for the procedure scheduled.

## 2011-10-12 NOTE — Telephone Encounter (Signed)
Will fax note of Dr Hochrein's approval to hold Plavix

## 2012-01-08 ENCOUNTER — Telehealth: Payer: Self-pay | Admitting: Cardiology

## 2012-01-08 DIAGNOSIS — I6529 Occlusion and stenosis of unspecified carotid artery: Secondary | ICD-10-CM

## 2012-01-08 NOTE — Telephone Encounter (Signed)
New msg Pt called to make appt and was wanting to know when she needs to come in for carotid doppler. Please call her back

## 2012-01-08 NOTE — Telephone Encounter (Signed)
Pt states she was under the impression that she needs a carotid doppler in June.  According to last doppler report, pt was to repeat test in 1 year.  Carotid doppler ordered and pt was notified.  Message sent to [facility] to schedule in June.

## 2012-03-09 ENCOUNTER — Ambulatory Visit: Payer: Medicare Other | Admitting: Cardiology

## 2012-03-14 ENCOUNTER — Encounter (INDEPENDENT_AMBULATORY_CARE_PROVIDER_SITE_OTHER): Payer: Medicare Other

## 2012-03-14 ENCOUNTER — Ambulatory Visit (INDEPENDENT_AMBULATORY_CARE_PROVIDER_SITE_OTHER): Payer: Medicare Other | Admitting: Cardiology

## 2012-03-14 ENCOUNTER — Encounter: Payer: Self-pay | Admitting: Cardiology

## 2012-03-14 VITALS — BP 170/80 | HR 58 | Ht 63.0 in | Wt 153.4 lb

## 2012-03-14 DIAGNOSIS — I10 Essential (primary) hypertension: Secondary | ICD-10-CM

## 2012-03-14 DIAGNOSIS — I6529 Occlusion and stenosis of unspecified carotid artery: Secondary | ICD-10-CM

## 2012-03-14 DIAGNOSIS — E785 Hyperlipidemia, unspecified: Secondary | ICD-10-CM

## 2012-03-14 NOTE — Assessment & Plan Note (Signed)
She has bilateral less than 60% stenosis. This was done today. We will followup in 1 year. She will continue with risk reduction.

## 2012-03-14 NOTE — Assessment & Plan Note (Signed)
Her LDL was 82 and HDL 72 in December of last year. This is an acceptable ratio. She will continue with the meds as listed.

## 2012-03-14 NOTE — Assessment & Plan Note (Signed)
The patient has no new sypmtoms.  No further cardiovascular testing is indicated.  We will continue with aggressive risk reduction and meds as listed.  

## 2012-03-14 NOTE — Assessment & Plan Note (Signed)
The blood pressure continues to be high. I have instructed the patient to record a blood pressure diary and recording this. This will be presented for my review and pending these results I will make further suggestions about changes in therapy for optimal blood pressure control.  

## 2012-03-14 NOTE — Progress Notes (Signed)
HPI The patient presents for followup of her coronary artery disease. Since I last saw her she has done OK from a cardiac standpoint. She denies any ongoing symptoms such as chest discomfort, neck or arm discomfort. She has no palpitations, presyncope or syncope. She has had no weight gain or edema. She denies any shortness of breath, PND or orthopnea. She has had problems with her sciatic nerve. She's required some back injections.   However, she's able to do some walking on a treadmill without significant limitations.  Allergies  Allergen Reactions  . Hydrocodone   . Prednisone     Current Outpatient Prescriptions  Medication Sig Dispense Refill  . Acetaminophen (TYLENOL PO) Take by mouth.        Marland Kitchen aspirin 81 MG tablet Take 81 mg by mouth daily.        Marland Kitchen atorvastatin (LIPITOR) 40 MG tablet Take 40 mg by mouth daily.        . Calcium Carbonate-Vitamin D (CALCIUM + D PO) Take by mouth daily.        . clopidogrel (PLAVIX) 75 MG tablet Take 75 mg by mouth daily.        . fish oil-omega-3 fatty acids 1000 MG capsule Take 2 g by mouth daily.        . fluticasone (FLONASE) 50 MCG/ACT nasal spray Place 2 sprays into the nose daily.        . isosorbide mononitrate (IMDUR) 30 MG 24 hr tablet Take 30 mg by mouth daily.        Marland Kitchen levothyroxine (SYNTHROID) 100 MCG tablet Take 100 mcg by mouth daily.        Marland Kitchen losartan (COZAAR) 25 MG tablet Take 25 mg by mouth daily.        . metoprolol (TOPROL-XL) 50 MG 24 hr tablet Take 50 mg by mouth 3 (three) times daily.        . Multiple Vitamins-Minerals (OCUVITE PRESERVISION PO) Take by mouth 2 (two) times daily.        . multivitamin (THERAGRAN) per tablet Take 1 tablet by mouth daily.        . nitroGLYCERIN (NITROSTAT) 0.4 MG SL tablet Place 0.4 mg under the tongue every 5 (five) minutes as needed.        [name] Sulfate (EYE DROPS A/C OP) Apply to eye.          Past Medical History  Diagnosis Date  . Osteoporosis   . CAD, UNSPECIFIED  SITE     2009. 99% proximal RCA stenosis followed by 80% distal stenosis. The LAD has a 100% stenosis in the midsegment. The circumflex is patent with distal occlusion. She has been managed medically. Her EF was well-preserved.  Marland Kitchen CAROTID STENOSIS   . DIVERTICULAR DISEASE   . Essential hypertension, benign   . HYPERLIPIDEMIA   . MACULAR DEGENERATION   . OSTEOPOROSIS   . Rheumatoid arthritis     Past Surgical History  Procedure Date  . Appendectomy   . Tonsillectomy   . Hysterectomy unspecified   . Im nailing femoral shaft retrograde     ROS:  As stated in the HPI and negative for all other systems.  PHYSICAL EXAM BP 170/80  Pulse 58  Ht 5\' 3"  (1.6 m)  Wt 153 lb 6.4 oz (69.582 kg)  BMI 27.17 kg/m2 GENERAL:  Well appearing HEENT:  Pupils equal round and reactive, fundi not visualized, oral mucosa unremarkable NECK:  No jugular venous distention, waveform within normal  limits, carotid upstroke brisk and symmetric, no bruits, no thyromegaly LUNGS:  Clear to auscultation bilaterally BACK:  No CVA tenderness CHEST:  Unremarkable HEART:  PMI not displaced or sustained,S1 and S2 within normal limits, no S3, no S4, no clicks, no rubs, no murmurs ABD:  Flat, positive bowel sounds normal in frequency in pitch, no bruits, no rebound, no guarding, no midline pulsatile mass, no hepatomegaly, no splenomegaly EXT:  2 plus pulses throughout, no edema, no cyanosis no clubbing  EKG:  Sinus rhythm, rate 58, axis within normal limits, intervals within normal limits, no acute ST-T wave changes.  03/14/2012  ASSESSMENT AND PLAN

## 2012-03-14 NOTE — Patient Instructions (Signed)
Your physician wants you to follow-up in: 1 year. You will receive a reminder letter in the mail two months in advance. If you don't receive a letter, please call our office to schedule the follow-up appointment.  

## 2012-03-16 ENCOUNTER — Encounter: Payer: Self-pay | Admitting: Cardiology

## 2012-05-23 ENCOUNTER — Other Ambulatory Visit: Payer: Self-pay | Admitting: Cardiology

## 2012-05-23 MED ORDER — NITROGLYCERIN 0.4 MG SL SUBL: 0.4000 mg | SUBLINGUAL_TABLET | SUBLINGUAL | Status: DC | PRN

## 2012-05-26 ENCOUNTER — Other Ambulatory Visit: Payer: Self-pay | Admitting: Cardiology

## 2012-05-26 MED ORDER — NITROGLYCERIN 0.4 MG SL SUBL: 0.4000 mg | SUBLINGUAL_TABLET | SUBLINGUAL | Status: DC | PRN

## 2012-05-26 NOTE — Telephone Encounter (Signed)
Pt said they havent received rx please resend

## 2012-07-06 ENCOUNTER — Other Ambulatory Visit: Payer: Self-pay | Admitting: Dermatology

## 2012-09-17 ENCOUNTER — Encounter: Payer: Self-pay | Admitting: Internal Medicine

## 2012-09-22 ENCOUNTER — Encounter: Payer: Self-pay | Admitting: Internal Medicine

## 2012-10-12 ENCOUNTER — Encounter: Payer: Self-pay | Admitting: Internal Medicine

## 2012-11-03 ENCOUNTER — Ambulatory Visit (INDEPENDENT_AMBULATORY_CARE_PROVIDER_SITE_OTHER): Payer: Medicare Other | Admitting: Internal Medicine

## 2012-11-03 ENCOUNTER — Encounter: Payer: Self-pay | Admitting: Internal Medicine

## 2012-11-03 VITALS — BP 142/80 | HR 64 | Ht 61.5 in | Wt 150.5 lb

## 2012-11-03 DIAGNOSIS — Z1211 Encounter for screening for malignant neoplasm of colon: Secondary | ICD-10-CM

## 2012-11-03 DIAGNOSIS — I2510 Atherosclerotic heart disease of native coronary artery without angina pectoris: Secondary | ICD-10-CM

## 2012-11-03 DIAGNOSIS — Z01818 Encounter for other preprocedural examination: Secondary | ICD-10-CM

## 2012-11-03 NOTE — Patient Instructions (Addendum)
It has been recommended to you by your physician that you have a(n) colonoscopy completed. Per your request, we did not schedule the procedure(s) today. Please contact our office at [phone] should you decide to have the procedure completed.  Thank you for choosing me and [facility].  Iva Boop, M.D., [facility]

## 2012-11-03 NOTE — Progress Notes (Signed)
Subjective:    Patient ID: [name], female    DOB: [birth year], 77 y.o.   MRN: [id number]  HPI This pleasant elderly woman is her regarding need for repeat colonoscopy to screen for colorectal polyps and cancer. Last exam 10/2002 was without neoplasia. She has no GI complaints except occasional swelling of hemorrhoids. She has no angina or dyspnea on exertion problems. She takes clopidogrel. She has stopped that intermittently over the years without problems. Allergies  Allergen Reactions  . Hydrocodone   . Prednisone     Oral   Outpatient Prescriptions Prior to Visit  Medication Sig Dispense Refill  . Acetaminophen (TYLENOL PO) Take by mouth.        Marland Kitchen aspirin 81 MG tablet Take 81 mg by mouth daily.        Marland Kitchen atorvastatin (LIPITOR) 40 MG tablet Take 40 mg by mouth daily.        . Calcium Carbonate-Vitamin D (CALCIUM + D PO) Take by mouth daily.        . clopidogrel (PLAVIX) 75 MG tablet Take 75 mg by mouth daily.        . fish oil-omega-3 fatty acids 1000 MG capsule Take 2 g by mouth daily.        . fluticasone (FLONASE) 50 MCG/ACT nasal spray Place 2 sprays into the nose daily.        . isosorbide mononitrate (IMDUR) 30 MG 24 hr tablet Take 30 mg by mouth daily.        Marland Kitchen levothyroxine (SYNTHROID) 100 MCG tablet Take 100 mcg by mouth daily.        Marland Kitchen losartan (COZAAR) 25 MG tablet Take 25 mg by mouth daily.        . metoprolol (TOPROL-XL) 50 MG 24 hr tablet Take 50 mg by mouth 3 (three) times daily.        . Multiple Vitamins-Minerals (OCUVITE PRESERVISION PO) Take by mouth 2 (two) times daily.        . multivitamin (THERAGRAN) per tablet Take 1 tablet by mouth daily.        . nitroGLYCERIN (NITROSTAT) 0.4 MG SL tablet Place 1 tablet (0.4 mg total) under the tongue every 5 (five) minutes as needed.  25 tablet  6  . Tetrahydrozoline-Zn Sulfate (EYE DROPS A/C OP) Apply to eye.         Last reviewed on 11/03/2012  9:30 AM by Iva Boop, MD Past Medical History  Diagnosis Date   . CAD, UNSPECIFIED SITE     2009. 99% proximal RCA stenosis followed by 80% distal stenosis. The LAD has a 100% stenosis in the midsegment. The circumflex is patent with distal occlusion. She has been managed medically. Her EF was well-preserved.  Marland Kitchen CAROTID STENOSIS   . DIVERTICULAR DISEASE   . Essential hypertension, benign   . HYPERLIPIDEMIA   . MACULAR DEGENERATION   . OSTEOPOROSIS   . Rheumatoid arthritis   . Hx of degenerative disc disease   . Gastritis   . Thyroid cancer 2008  . H/O blood clots 1984    in groin area that travels to lungs  . Gallstones 2009   Past Surgical History  Procedure Date  . Appendectomy 1959  . Tonsillectomy   . Abdominal hysterectomy 1984  . Im nailing femoral shaft retrograde 2010    left  . Wrist surgery 2004    Left  . Esophagogastroduodenoscopy   . Colonoscopy   . Breast cyst excision  left  . Cholecystectomy 2009   History   Social History  . Marital Status: Married    Spouse Name: N/A    Number of Children: 1  .     Occupational History  . retired    Social History Main Topics  . Smoking status: Never Smoker   . Smokeless tobacco: Never Used  . Alcohol Use: No  . Drug Use: No  . Sexually Active: None          Social History Narrative   Married, retired1 daughter   Family History  Problem Relation Age of Onset  . Heart attack Mother   . Heart disease Father   . Diabetes Father   . Ovarian cancer Maternal Aunt   . Clotting disorder Mother   . Lung cancer Brother    Review of Systems + back pain (helped by chiropracter) and macular degeneration and vision problems all other ROS negative   Objective:   Physical Exam General:  NAD Eyes:   anicteric Lungs:  clear Heart:  S1S2 no rubs, murmurs or gallops Abdomen:  soft and nontender, BS+  Data Reviewed:  2004 colonoscopy 2006 EGD 03/2012 cardiology note      Assessment & Plan:   1. Special screening for malignant neoplasms, colon   2. Preoperative  examination, unspecified   3. CAD (coronary artery disease), native coronary artery on clopidogrel    1. I have recommended a preventive screening colonoscopy, holding clopidogrel x 5-7 days prior. 2. She wants to discuss this with Dr. Evlyn Kanner before proceeding 3. She was offered annual FOBT also The risks and benefits as well as alternatives of endoscopic procedure(s) have been discussed and reviewed. All questions answered. The patient agrees to proceed. She will call back when ready to schedule which we can do with RN previsit but will need to ask Dr. Jenene Slicker opinion re: holding clopidogrel  I appreciate the opportunity to care for this patient.  [id number] Hessie Diener, MD

## 2013-03-06 ENCOUNTER — Ambulatory Visit (INDEPENDENT_AMBULATORY_CARE_PROVIDER_SITE_OTHER): Payer: Medicare Other | Admitting: Cardiology

## 2013-03-06 ENCOUNTER — Encounter (INDEPENDENT_AMBULATORY_CARE_PROVIDER_SITE_OTHER): Payer: Medicare Other

## 2013-03-06 ENCOUNTER — Encounter: Payer: Self-pay | Admitting: Cardiology

## 2013-03-06 VITALS — BP 145/80 | HR 54 | Ht 62.0 in | Wt 148.0 lb

## 2013-03-06 DIAGNOSIS — I6529 Occlusion and stenosis of unspecified carotid artery: Secondary | ICD-10-CM

## 2013-03-06 DIAGNOSIS — E785 Hyperlipidemia, unspecified: Secondary | ICD-10-CM

## 2013-03-06 DIAGNOSIS — I10 Essential (primary) hypertension: Secondary | ICD-10-CM

## 2013-03-06 DIAGNOSIS — I2510 Atherosclerotic heart disease of native coronary artery without angina pectoris: Secondary | ICD-10-CM

## 2013-03-06 NOTE — Patient Instructions (Addendum)
The current medical regimen is effective;  continue present plan and medications.  Follow up in 1 year with Dr Hochrein.  You will receive a letter in the mail 2 months before you are due.  Please call us when you receive this letter to schedule your follow up appointment.  

## 2013-03-06 NOTE — Progress Notes (Signed)
HPI The patient presents for followup of her coronary artery disease. Since I last saw her she has done OK from a cardiac standpoint. She denies any ongoing symptoms such as chest discomfort, neck or arm discomfort. She has no palpitations, presyncope or syncope. She has had no weight gain or edema. She denies any shortness of breath, PND or orthopnea. She has some neuropathic pain.  However, she's able to do some walking on a treadmill without significant limitations.  She does get SOB if she climbs stairs but this is baseline.   Allergies  Allergen Reactions  . Hydrocodone   . Prednisone     Oral    Current Outpatient Prescriptions  Medication Sig Dispense Refill  . Acetaminophen (TYLENOL PO) Take by mouth.        Marland Kitchen aspirin 81 MG tablet Take 81 mg by mouth daily.        Marland Kitchen atorvastatin (LIPITOR) 40 MG tablet Take 40 mg by mouth daily.        . Calcium Carbonate-Vitamin D (CALCIUM + D PO) Take by mouth daily.        . clopidogrel (PLAVIX) 75 MG tablet Take 75 mg by mouth daily.        . fish oil-omega-3 fatty acids 1000 MG capsule Take 2 g by mouth daily.        . fluticasone (FLONASE) 50 MCG/ACT nasal spray Place 2 sprays into the nose daily.        . isosorbide mononitrate (IMDUR) 30 MG 24 hr tablet Take 30 mg by mouth daily.        Marland Kitchen levothyroxine (SYNTHROID) 100 MCG tablet Take 100 mcg by mouth daily.        Marland Kitchen losartan (COZAAR) 25 MG tablet Take 25 mg by mouth daily.        . metoprolol (TOPROL-XL) 50 MG 24 hr tablet Take 50 mg by mouth 3 (three) times daily.        . Multiple Vitamins-Minerals (OCUVITE PRESERVISION PO) Take by mouth 2 (two) times daily.        . multivitamin (THERAGRAN) per tablet Take 1 tablet by mouth daily.        . nitroGLYCERIN (NITROSTAT) 0.4 MG SL tablet Place 1 tablet (0.4 mg total) under the tongue every 5 (five) minutes as needed.  25 tablet  6  . pregabalin (LYRICA) 75 MG capsule Take 75 mg by mouth daily.      [name] Sulfate (EYE DROPS  A/C OP) Apply to eye.         No current facility-administered medications for this visit.    Past Medical History  Diagnosis Date  . CAD, UNSPECIFIED SITE     2009. 99% proximal RCA stenosis followed by 80% distal stenosis. The LAD has a 100% stenosis in the midsegment. The circumflex is patent with distal occlusion. She has been managed medically. Her EF was well-preserved.  Marland Kitchen CAROTID STENOSIS   . DIVERTICULAR DISEASE   . Essential hypertension, benign   . HYPERLIPIDEMIA   . MACULAR DEGENERATION   . OSTEOPOROSIS   . Rheumatoid arthritis(714.0)   . Hx of degenerative disc disease   . Gastritis   . Thyroid cancer 2008  . H/O blood clots 1984    in groin area that travels to lungs  . Gallstones 2009    Past Surgical History  Procedure Laterality Date  . Appendectomy  1959  . Tonsillectomy    . Abdominal hysterectomy  1984  .  Im nailing femoral shaft retrograde  2010    left  . Wrist surgery  2004    Left  . Esophagogastroduodenoscopy    . Colonoscopy    . Breast cyst excision      left  . Cholecystectomy  2009    ROS:  As stated in the HPI and negative for all other systems.  PHYSICAL EXAM BP 145/80  Pulse 54  Ht 5\' 2"  (1.575 m)  Wt 148 lb (67.132 kg)  BMI 27.06 kg/m2 GENERAL:  Well appearing HEENT:  Pupils equal round and reactive, fundi not visualized, oral mucosa unremarkable NECK:  No jugular venous distention, waveform within normal limits, carotid upstroke brisk and symmetric, no bruits, no thyromegaly LUNGS:  Clear to auscultation bilaterally BACK:  No CVA tenderness CHEST:  Unremarkable HEART:  PMI not displaced or sustained,S1 and S2 within normal limits, no S3, no S4, no clicks, no rubs, no murmurs ABD:  Flat, positive bowel sounds normal in frequency in pitch, no bruits, no rebound, no guarding, no midline pulsatile mass, no hepatomegaly, no splenomegaly EXT:  2 plus pulses upper and diminished DP/PT bilateral as I have a, no edema, no cyanosis no  clubbing  EKG:  Sinus rhythm, rate 54, axis within normal limits, intervals within normal limits, no acute ST-T wave changes.  03/06/2013  ASSESSMENT AND PLAN  CAD: The patient has no new sypmtoms.  No further cardiovascular testing is indicated.  We will continue with aggressive risk reduction and meds as listed.  HTN:   Her blood pressure is slightly elevated and I suggested that she should keep a BP diaryshe keep a blood pressure diary.  HYPERLIPIDEMIA:  This is followed by . SOUTH,STEPHEN ALAN, MDA  CAROTID STENOSIS:  Done today.  I will follow up.

## 2013-05-19 ENCOUNTER — Other Ambulatory Visit: Payer: Self-pay | Admitting: Physician Assistant

## 2013-06-21 ENCOUNTER — Telehealth: Payer: Self-pay | Admitting: Cardiology

## 2013-06-21 NOTE — Telephone Encounter (Signed)
New problem   Need nurse to call her concerning cardiac clearance for pt's oral sx that need to be scheduled ASAP

## 2013-06-21 NOTE — Telephone Encounter (Signed)
Erica from Dr. Elwyn Lade office wanting to know if Dr. Antoine Poche had cleared pt for dental procedure.  Unable to find form so Alcario Drought will refax and will put on Pam's (Dr. Jenene Slicker) nurse cart to address when he is in office Thurs PM.

## 2013-06-27 ENCOUNTER — Telehealth: Payer: Self-pay | Admitting: Cardiology

## 2013-06-27 NOTE — Telephone Encounter (Signed)
Received request from Nurse, documents faxed for surgical clearance. To: [facility] Fax number: [phone] Attention: 06/27/13/km

## 2013-10-28 ENCOUNTER — Emergency Department (HOSPITAL_COMMUNITY): Payer: Medicare Other

## 2013-10-28 ENCOUNTER — Encounter (HOSPITAL_COMMUNITY): Payer: Self-pay | Admitting: Emergency Medicine

## 2013-10-28 ENCOUNTER — Other Ambulatory Visit: Payer: Self-pay

## 2013-10-28 ENCOUNTER — Inpatient Hospital Stay (HOSPITAL_COMMUNITY)
Admission: EM | Admit: 2013-10-28 | Discharge: 2013-11-01 | DRG: 280 | Disposition: A | Discharge status: Home / Self Care | Payer: Medicare Other | Attending: Endocrinology | Admitting: Endocrinology

## 2013-10-28 DIAGNOSIS — I6529 Occlusion and stenosis of unspecified carotid artery: Secondary | ICD-10-CM | POA: Diagnosis present

## 2013-10-28 DIAGNOSIS — I471 Supraventricular tachycardia, unspecified: Secondary | ICD-10-CM

## 2013-10-28 DIAGNOSIS — Z8673 Personal history of transient ischemic attack (TIA), and cerebral infarction without residual deficits: Secondary | ICD-10-CM

## 2013-10-28 DIAGNOSIS — Z85850 Personal history of malignant neoplasm of thyroid: Secondary | ICD-10-CM

## 2013-10-28 DIAGNOSIS — Z8249 Family history of ischemic heart disease and other diseases of the circulatory system: Secondary | ICD-10-CM

## 2013-10-28 DIAGNOSIS — E785 Hyperlipidemia, unspecified: Secondary | ICD-10-CM | POA: Diagnosis present

## 2013-10-28 DIAGNOSIS — Z7982 Long term (current) use of aspirin: Secondary | ICD-10-CM

## 2013-10-28 DIAGNOSIS — R778 Other specified abnormalities of plasma proteins: Secondary | ICD-10-CM

## 2013-10-28 DIAGNOSIS — I4892 Unspecified atrial flutter: Secondary | ICD-10-CM | POA: Diagnosis present

## 2013-10-28 DIAGNOSIS — Z833 Family history of diabetes mellitus: Secondary | ICD-10-CM

## 2013-10-28 DIAGNOSIS — Z8041 Family history of malignant neoplasm of ovary: Secondary | ICD-10-CM

## 2013-10-28 DIAGNOSIS — R0600 Dyspnea, unspecified: Secondary | ICD-10-CM | POA: Diagnosis present

## 2013-10-28 DIAGNOSIS — H3530 Unspecified macular degeneration: Secondary | ICD-10-CM | POA: Diagnosis present

## 2013-10-28 DIAGNOSIS — G459 Transient cerebral ischemic attack, unspecified: Secondary | ICD-10-CM | POA: Diagnosis present

## 2013-10-28 DIAGNOSIS — R7989 Other specified abnormal findings of blood chemistry: Secondary | ICD-10-CM | POA: Diagnosis not present

## 2013-10-28 DIAGNOSIS — J189 Pneumonia, unspecified organism: Secondary | ICD-10-CM | POA: Diagnosis present

## 2013-10-28 DIAGNOSIS — M810 Age-related osteoporosis without current pathological fracture: Secondary | ICD-10-CM | POA: Diagnosis present

## 2013-10-28 DIAGNOSIS — Z86711 Personal history of pulmonary embolism: Secondary | ICD-10-CM | POA: Diagnosis present

## 2013-10-28 DIAGNOSIS — Z9089 Acquired absence of other organs: Secondary | ICD-10-CM

## 2013-10-28 DIAGNOSIS — Z7902 Long term (current) use of antithrombotics/antiplatelets: Secondary | ICD-10-CM

## 2013-10-28 DIAGNOSIS — E039 Hypothyroidism, unspecified: Secondary | ICD-10-CM | POA: Diagnosis present

## 2013-10-28 DIAGNOSIS — D72819 Decreased white blood cell count, unspecified: Secondary | ICD-10-CM | POA: Diagnosis not present

## 2013-10-28 DIAGNOSIS — N390 Urinary tract infection, site not specified: Secondary | ICD-10-CM | POA: Diagnosis present

## 2013-10-28 DIAGNOSIS — I10 Essential (primary) hypertension: Secondary | ICD-10-CM | POA: Diagnosis present

## 2013-10-28 DIAGNOSIS — K5730 Diverticulosis of large intestine without perforation or abscess without bleeding: Secondary | ICD-10-CM | POA: Diagnosis present

## 2013-10-28 DIAGNOSIS — E871 Hypo-osmolality and hyponatremia: Secondary | ICD-10-CM | POA: Diagnosis present

## 2013-10-28 DIAGNOSIS — I2510 Atherosclerotic heart disease of native coronary artery without angina pectoris: Secondary | ICD-10-CM | POA: Diagnosis present

## 2013-10-28 DIAGNOSIS — M069 Rheumatoid arthritis, unspecified: Secondary | ICD-10-CM | POA: Diagnosis present

## 2013-10-28 DIAGNOSIS — B964 Proteus (mirabilis) (morganii) as the cause of diseases classified elsewhere: Secondary | ICD-10-CM | POA: Diagnosis present

## 2013-10-28 DIAGNOSIS — Z801 Family history of malignant neoplasm of trachea, bronchus and lung: Secondary | ICD-10-CM

## 2013-10-28 DIAGNOSIS — Z79899 Other long term (current) drug therapy: Secondary | ICD-10-CM

## 2013-10-28 DIAGNOSIS — I214 Non-ST elevation (NSTEMI) myocardial infarction: Secondary | ICD-10-CM | POA: Diagnosis present

## 2013-10-28 LAB — COMPREHENSIVE METABOLIC PANEL
ALK PHOS: 90 U/L (ref 39–117)
ALT: 31 U/L (ref 0–35)
AST: 51 U/L — ABNORMAL HIGH (ref 0–37)
Albumin: 4 g/dL (ref 3.5–5.2)
BUN: 14 mg/dL (ref 6–23)
CALCIUM: 8.8 mg/dL (ref 8.4–10.5)
CO2: 25 meq/L (ref 19–32)
Chloride: 89 mEq/L — ABNORMAL LOW (ref 96–112)
Creatinine, Ser: 0.63 mg/dL (ref 0.50–1.10)
GFR, EST NON AFRICAN AMERICAN: 86 mL/min — AB (ref >90)
GLUCOSE: 83 mg/dL (ref 70–99)
POTASSIUM: 5 meq/L (ref 3.7–5.3)
SODIUM: 129 meq/L — AB (ref 137–147)
Total Bilirubin: 0.6 mg/dL (ref 0.3–1.2)
Total Protein: 9.4 g/dL — ABNORMAL HIGH (ref 6.0–8.3)

## 2013-10-28 LAB — CBC WITH DIFFERENTIAL/PLATELET
Basophils Absolute: 0 10*3/uL (ref 0.0–0.1)
Basophils Relative: 0 % (ref 0–1)
EOS PCT: 1 % (ref 0–5)
Eosinophils Absolute: 0 10*3/uL (ref 0.0–0.7)
HCT: 39.1 % (ref 36.0–46.0)
HEMOGLOBIN: 14.2 g/dL (ref 12.0–15.0)
LYMPHS ABS: 0.4 10*3/uL — AB (ref 0.7–4.0)
LYMPHS PCT: 10 % — AB (ref 12–46)
MCH: 32.8 pg (ref 26.0–34.0)
MCHC: 36.3 g/dL — ABNORMAL HIGH (ref 30.0–36.0)
MCV: 90.3 fL (ref 78.0–100.0)
MONOS PCT: 24 % — AB (ref 3–12)
Monocytes Absolute: 1 10*3/uL (ref 0.1–1.0)
Neutro Abs: 2.7 10*3/uL (ref 1.7–7.7)
Neutrophils Relative %: 66 % (ref 43–77)
PLATELETS: 167 10*3/uL (ref 150–400)
RBC: 4.33 MIL/uL (ref 3.87–5.11)
RDW: 12.9 % (ref 11.5–15.5)
WBC: 4.1 10*3/uL (ref 4.0–10.5)

## 2013-10-28 LAB — URINALYSIS, ROUTINE W REFLEX MICROSCOPIC
BILIRUBIN URINE: NEGATIVE
Glucose, UA: NEGATIVE mg/dL
KETONES UR: 40 mg/dL — AB
Leukocytes, UA: NEGATIVE
NITRITE: NEGATIVE
PROTEIN: 30 mg/dL — AB
Specific Gravity, Urine: 1.016 (ref 1.005–1.030)
UROBILINOGEN UA: 0.2 mg/dL (ref 0.0–1.0)
pH: 6 (ref 5.0–8.0)

## 2013-10-28 LAB — PRO B NATRIURETIC PEPTIDE: PRO B NATRI PEPTIDE: 744.4 pg/mL — AB (ref 0–450)

## 2013-10-28 LAB — TROPONIN I: Troponin I: 0.42 ng/mL (ref <0.30)

## 2013-10-28 LAB — CG4 I-STAT (LACTIC ACID): LACTIC ACID, VENOUS: 1.67 mmol/L (ref 0.5–2.2)

## 2013-10-28 LAB — URINE MICROSCOPIC-ADD ON

## 2013-10-28 MED ORDER — ASPIRIN 81 MG PO TABS: 81.0000 mg | ORAL_TABLET | Freq: Every day | ORAL | Status: DC

## 2013-10-28 MED ORDER — ONDANSETRON HCL 4 MG PO TABS: 4.0000 mg | ORAL_TABLET | Freq: Four times a day (QID) | ORAL | Status: DC | PRN

## 2013-10-28 MED ORDER — ONDANSETRON HCL 4 MG/2ML IJ SOLN
4.0000 mg | Freq: Four times a day (QID) | INTRAMUSCULAR | Status: DC | PRN
  Administered 2013-10-30: 4 mg via INTRAVENOUS
  Filled 2013-10-28: qty 2

## 2013-10-28 MED ORDER — DEXTROSE 5 % IV SOLN
500.0000 mg | INTRAVENOUS | Status: DC
  Administered 2013-10-29 – 2013-10-30 (×2): 500 mg via INTRAVENOUS
  Filled 2013-10-28 (×3): qty 500

## 2013-10-28 MED ORDER — OMEGA-3 FATTY ACIDS 1000 MG PO CAPS: 1.0000 g | ORAL_CAPSULE | Freq: Every day | ORAL | Status: DC

## 2013-10-28 MED ORDER — LEVALBUTEROL HCL 0.63 MG/3ML IN NEBU
0.6300 mg | INHALATION_SOLUTION | Freq: Four times a day (QID) | RESPIRATORY_TRACT | Status: DC
  Administered 2013-10-28 – 2013-10-29 (×2): 0.63 mg via RESPIRATORY_TRACT
  Filled 2013-10-28: qty 3

## 2013-10-28 MED ORDER — OMEGA-3-ACID ETHYL ESTERS 1 G PO CAPS
1.0000 g | ORAL_CAPSULE | Freq: Every day | ORAL | Status: DC
  Administered 2013-10-29: 1 g via ORAL
  Filled 2013-10-28 (×5): qty 1

## 2013-10-28 MED ORDER — GUAIFENESIN-DM 100-10 MG/5ML PO SYRP: 5.0000 mL | ORAL_SOLUTION | ORAL | Status: DC | PRN

## 2013-10-28 MED ORDER — CALCIUM CARBONATE-VITAMIN D 500-200 MG-UNIT PO TABS
1.0000 | ORAL_TABLET | Freq: Two times a day (BID) | ORAL | Status: DC
  Administered 2013-10-28 – 2013-10-31 (×7): 1 via ORAL
  Filled 2013-10-28 (×9): qty 1

## 2013-10-28 MED ORDER — OCUVITE PRESERVISION PO TABS: 1.0000 | ORAL_TABLET | Freq: Two times a day (BID) | ORAL | Status: DC

## 2013-10-28 MED ORDER — SODIUM CHLORIDE 0.9 % IV SOLN
INTRAVENOUS | Status: DC
  Administered 2013-10-28: 09:00:00 via INTRAVENOUS

## 2013-10-28 MED ORDER — SODIUM CHLORIDE 0.9 % IV SOLN
INTRAVENOUS | Status: DC
  Administered 2013-10-28 – 2013-10-30 (×2): via INTRAVENOUS
  Administered 2013-10-31: 1 mL via INTRAVENOUS

## 2013-10-28 MED ORDER — ACETAMINOPHEN 650 MG RE SUPP: 650.0000 mg | Freq: Four times a day (QID) | RECTAL | Status: DC | PRN

## 2013-10-28 MED ORDER — METOPROLOL SUCCINATE ER 50 MG PO TB24
50.0000 mg | ORAL_TABLET | Freq: Three times a day (TID) | ORAL | Status: DC
  Administered 2013-10-28: 50 mg via ORAL
  Filled 2013-10-28: qty 1

## 2013-10-28 MED ORDER — LOSARTAN POTASSIUM 25 MG PO TABS
25.0000 mg | ORAL_TABLET | Freq: Every day | ORAL | Status: DC
  Administered 2013-10-28 – 2013-10-31 (×4): 25 mg via ORAL
  Filled 2013-10-28 (×5): qty 1

## 2013-10-28 MED ORDER — ISOSORBIDE MONONITRATE ER 30 MG PO TB24
30.0000 mg | ORAL_TABLET | Freq: Every day | ORAL | Status: DC
Start: 2013-10-28 — End: 2013-11-01
  Administered 2013-10-28 – 2013-11-01 (×5): 30 mg via ORAL
  Filled 2013-10-28 (×5): qty 1

## 2013-10-28 MED ORDER — ENOXAPARIN SODIUM 40 MG/0.4ML ~~LOC~~ SOLN
40.0000 mg | SUBCUTANEOUS | Status: DC
  Administered 2013-10-28 – 2013-10-31 (×4): 40 mg via SUBCUTANEOUS
  Filled 2013-10-28 (×5): qty 0.4

## 2013-10-28 MED ORDER — LEVOTHYROXINE SODIUM 100 MCG PO TABS
100.0000 ug | ORAL_TABLET | Freq: Every day | ORAL | Status: DC
  Administered 2013-10-29 – 2013-11-01 (×4): 100 ug via ORAL
  Filled 2013-10-28 (×5): qty 1

## 2013-10-28 MED ORDER — DEXTROSE 5 % IV SOLN
1.0000 g | INTRAVENOUS | Status: DC
  Administered 2013-10-28 – 2013-10-31 (×5): 1 g via INTRAVENOUS
  Filled 2013-10-28 (×5): qty 10

## 2013-10-28 MED ORDER — MULTIVITAMINS PO TABS: 1.0000 | ORAL_TABLET | Freq: Every day | ORAL | Status: DC

## 2013-10-28 MED ORDER — ONDANSETRON HCL 4 MG/2ML IJ SOLN
4.0000 mg | Freq: Once | INTRAMUSCULAR | Status: AC
  Administered 2013-10-28: 4 mg via INTRAVENOUS
  Filled 2013-10-28: qty 2

## 2013-10-28 MED ORDER — ADULT MULTIVITAMIN W/MINERALS CH
1.0000 | ORAL_TABLET | Freq: Every day | ORAL | Status: DC
  Administered 2013-10-28 – 2013-11-01 (×5): 1 via ORAL
  Filled 2013-10-28 (×5): qty 1

## 2013-10-28 MED ORDER — NITROGLYCERIN 0.4 MG SL SUBL: 0.4000 mg | SUBLINGUAL_TABLET | SUBLINGUAL | Status: DC | PRN

## 2013-10-28 MED ORDER — CALCIUM CARBONATE-VITAMIN D 250-125 MG-UNIT PO TABS: 2.0000 | ORAL_TABLET | Freq: Two times a day (BID) | ORAL | Status: DC

## 2013-10-28 MED ORDER — CLOPIDOGREL BISULFATE 75 MG PO TABS
75.0000 mg | ORAL_TABLET | Freq: Every day | ORAL | Status: DC
  Administered 2013-10-28 – 2013-11-01 (×5): 75 mg via ORAL
  Filled 2013-10-28 (×5): qty 1

## 2013-10-28 MED ORDER — METOPROLOL SUCCINATE ER 50 MG PO TB24
75.0000 mg | ORAL_TABLET | Freq: Three times a day (TID) | ORAL | Status: DC
  Administered 2013-10-28 – 2013-11-01 (×11): 75 mg via ORAL
  Filled 2013-10-28 (×14): qty 1

## 2013-10-28 MED ORDER — ATORVASTATIN CALCIUM 40 MG PO TABS
40.0000 mg | ORAL_TABLET | Freq: Every day | ORAL | Status: DC
  Administered 2013-10-28 – 2013-10-31 (×4): 40 mg via ORAL
  Filled 2013-10-28 (×5): qty 1

## 2013-10-28 MED ORDER — FLUTICASONE PROPIONATE 50 MCG/ACT NA SUSP
2.0000 | Freq: Every day | NASAL | Status: DC
  Filled 2013-10-28: qty 16

## 2013-10-28 MED ORDER — ASPIRIN EC 81 MG PO TBEC
81.0000 mg | DELAYED_RELEASE_TABLET | Freq: Every day | ORAL | Status: DC
  Administered 2013-10-28 – 2013-11-01 (×5): 81 mg via ORAL
  Filled 2013-10-28 (×5): qty 1

## 2013-10-28 MED ORDER — IOHEXOL 300 MG/ML  SOLN
80.0000 mL | Freq: Once | INTRAMUSCULAR | Status: AC | PRN
  Administered 2013-10-28: 80 mL via INTRAVENOUS

## 2013-10-28 MED ORDER — ACETAMINOPHEN 325 MG PO TABS: 650.0000 mg | ORAL_TABLET | Freq: Four times a day (QID) | ORAL | Status: DC | PRN

## 2013-10-28 MED ORDER — OCUVITE-LUTEIN PO CAPS
1.0000 | ORAL_CAPSULE | Freq: Two times a day (BID) | ORAL | Status: DC
  Administered 2013-10-28 – 2013-11-01 (×8): 1 via ORAL
  Filled 2013-10-28 (×9): qty 1

## 2013-10-28 MED ORDER — ALUM & MAG HYDROXIDE-SIMETH 200-200-20 MG/5ML PO SUSP: 30.0000 mL | Freq: Four times a day (QID) | ORAL | Status: DC | PRN

## 2013-10-28 MED ORDER — SODIUM CHLORIDE 0.9 % IJ SOLN
3.0000 mL | Freq: Two times a day (BID) | INTRAMUSCULAR | Status: DC
  Administered 2013-10-28 – 2013-10-31 (×6): 3 mL via INTRAVENOUS

## 2013-10-28 NOTE — ED Notes (Signed)
Patient transported to X-ray 

## 2013-10-28 NOTE — ED Notes (Signed)
Pt back in room, on monitor, family at bedside.

## 2013-10-28 NOTE — ED Notes (Signed)
Pt hooked back up to monitor, taken to restroom for urine culture

## 2013-10-28 NOTE — H&P (Signed)
PCP:   Sheela Stack, MD   Chief Complaint:  Shortness of breath  HPI: [name] is a 78 year old white female with a history of coronary artery disease on medical therapy, rheumatoid arthritis, and history of PE presented to the emergency department with the complaint of shortness of breath. Patient states that she developed a nonproductive cough 2 days ago that has progressively worsened. Yesterday, she developed some mild nausea which has persisted today. This morning, she developed significant increase shortness of breath and palpitations prompting her to come to the emergency department. In the emergency department she initially had a heart rate of 140 which has fluctuated as high as 180 while getting on the bedpan but settled down around 100 with rest. Chest x-ray and CT of the chest have been performed revealing no evidence of PE but showed left perihilar pneumonitis. She reports low-grade fever but no chills or sweats. She does endorse palpitations which were worse this morning. No history of SVT.  Review of Systems:  Review of Systems - All systems reviewed with patient and are negative except in history of present illness to Past Medical History: Past Medical History  Diagnosis Date  . CAD, UNSPECIFIED SITE     2009. 99% proximal RCA stenosis followed by 80% distal stenosis. The LAD has a 100% stenosis in the midsegment. The circumflex is patent with distal occlusion. She has been managed medically. Her EF was well-preserved.  Marland Kitchen CAROTID STENOSIS   . DIVERTICULAR DISEASE   . Essential hypertension, benign   . HYPERLIPIDEMIA   . MACULAR DEGENERATION   . OSTEOPOROSIS   . Rheumatoid arthritis(714.0)   . Hx of degenerative disc disease   . Gastritis   . Thyroid cancer 2008  . H/O blood clots 1984    in groin area that travels to lungs  . Gallstones 2009   Past Surgical History  Procedure Laterality Date  . Appendectomy  1959  . Tonsillectomy    . Abdominal hysterectomy   1984  . Im nailing femoral shaft retrograde  2010    left  . Wrist surgery  2004    Left  . Esophagogastroduodenoscopy    . Colonoscopy    . Breast cyst excision      left  . Cholecystectomy  2009    Medications: Prior to Admission medications   Medication Sig Start Date End Date Taking? Authorizing Provider  Acetaminophen (TYLENOL PO) Take 500 mg by mouth every 6 (six) hours as needed (pain).    Yes Historical Provider, MD  aspirin 81 MG tablet Take 81 mg by mouth daily.     Yes Historical Provider, MD  atorvastatin (LIPITOR) 40 MG tablet Take 40 mg by mouth daily.     Yes Historical Provider, MD  Calcium Carbonate-Vitamin D (CALCIUM + D PO) Take by mouth daily.     Yes Historical Provider, MD  clopidogrel (PLAVIX) 75 MG tablet Take 75 mg by mouth daily.     Yes Historical Provider, MD  fish oil-omega-3 fatty acids 1000 MG capsule Take 1 g by mouth daily.    Yes Historical Provider, MD  fluticasone (FLONASE) 50 MCG/ACT nasal spray Place 2 sprays into the nose daily.     Yes Historical Provider, MD  isosorbide mononitrate (IMDUR) 30 MG 24 hr tablet Take 30 mg by mouth daily.     Yes Historical Provider, MD  levothyroxine (SYNTHROID) 100 MCG tablet Take 100 mcg by mouth daily.     Yes Historical Provider, MD  losartan (  COZAAR) 25 MG tablet Take 25 mg by mouth daily.     Yes Historical Provider, MD  metoprolol (TOPROL-XL) 50 MG 24 hr tablet Take 50 mg by mouth 3 (three) times daily.     Yes Historical Provider, MD  Multiple Vitamins-Minerals (OCUVITE PRESERVISION PO) Take by mouth 2 (two) times daily.     Yes Historical Provider, MD  multivitamin [facility]) per tablet Take 1 tablet by mouth daily.     Yes Historical Provider, MD  Tetrahydrozoline-Zn Sulfate (EYE DROPS A/C OP) Apply to eye.     Yes Historical Provider, MD  nitroGLYCERIN (NITROSTAT) 0.4 MG SL tablet Place 1 tablet (0.4 mg total) under the tongue every 5 (five) minutes as needed. 05/26/12   Minus Breeding, MD     Allergies:   Allergies  Allergen Reactions  . Hydrocodone Other (See Comments)    "feels weird"  . Prednisone Nausea And Vomiting    Oral    Social History:  reports that she has never smoked. She has never used smokeless tobacco. She reports that she does not drink alcohol or use illicit drugs.  Family History: Family History  Problem Relation Age of Onset  . Heart attack Mother   . Heart disease Father   . Diabetes Father   . Ovarian cancer Maternal Aunt   . Clotting disorder Mother   . Lung cancer Brother     Physical Exam: Filed Vitals:   10/28/13 1315 10/28/13 1330 10/28/13 1345 10/28/13 1400  BP: 178/101 182/76 163/67 182/80  Pulse: 153 100 95 103  Temp:      TempSrc:      Resp: 18 27 21 24   SpO2: 97% 96% 95% 96%   General appearance: alert and fatigued Head: Normocephalic, without obvious abnormality, atraumatic Eyes: conjunctivae/corneas clear. PERRL, EOM's intact.  Nose: Nares normal. Septum midline. Mucosa normal. No drainage or sinus tenderness. Throat: lips, mucosa, and tongue normal; teeth and gums normal Neck: no adenopathy, no carotid bruit, no JVD and thyroid not enlarged, symmetric, no tenderness/mass/nodules Resp: Decreased breath sounds bilaterally with minimal left basilar crackles; no wheezes Cardio: Tachycardic without murmurs rubs or gallop, regular GI: soft, non-tender; bowel sounds normal; no masses,  no organomegaly Extremities: extremities normal, atraumatic, no cyanosis or edema Pulses: 2+ and symmetric Lymph nodes: Cervical adenopathy: no cervical lymphadenopathy Neurologic: Alert and oriented X 3, normal strength and tone. Normal symmetric reflexes.     Labs on Admission:   Recent Labs  10/28/13 0923  NA 129*  K 5.0  CL 89*  CO2 25  GLUCOSE 83  BUN 14  CREATININE 0.63  CALCIUM 8.8    Recent Labs  10/28/13 0923  AST 51*  ALT 31  ALKPHOS 90  BILITOT 0.6  PROT 9.4*  ALBUMIN 4.0   No results found for this  basename: LIPASE, AMYLASE,  in the last 72 hours  Recent Labs  10/28/13 0923  WBC 4.1  NEUTROABS 2.7  HGB 14.2  HCT 39.1  MCV 90.3  PLT 167    Recent Labs  10/28/13 0923  TROPONINI <0.30   Lab Results  Component Value Date   INR 2.10* 08/05/2009   INR 1.93* 08/04/2009   INR 1.71* 08/03/2009   BNP    Component Value Date/Time   PROBNP 744.4* 10/28/2013 0923    Radiological Exams on Admission: Dg Chest 2 View  10/28/2013   CLINICAL DATA:  Cough.  Fever.  Coronary artery disease.  EXAM: CHEST  2 VIEW  COMPARISON:  08/04/2009  FINDINGS: Moderate cardiomegaly is stable. No evidence of congestive heart failure. Changes of COPD again noted.  Two ill-defined nodular opacities are seen in the right upper and mid lung fields on the frontal projection. These were not seen on previous study. Neoplasm cannot be excluded.  IMPRESSION: New right upper and mid lung field nodular opacities. Neoplasm cannot be excluded. Chest CT without contrast recommended for further evaluation.  Stable cardiomegaly and COPD.   Electronically Signed   By: Earle Gell M.D.   On: 10/28/2013 11:20   Ct Chest W Contrast  10/28/2013   CLINICAL DATA:  Productive cough and low-grade fever.  EXAM: CT CHEST WITH CONTRAST  TECHNIQUE: Multidetector CT imaging of the chest was performed during intravenous contrast administration.  CONTRAST:  26mL OMNIPAQUE IOHEXOL 300 MG/ML  SOLN  COMPARISON:  Chest x-ray 10/28/2013.  Chest CT 11/14/2010.  FINDINGS: Atherosclerotic vascular disease noted in thoracic and upper abdominal aorta. No aneurysm. No dissection. Great vessels are unremarkable. Direct origin of the left vertebral artery is noted. This is a normal variant. Pulmonary arteries are normal. No pulmonary embolus. Coronary artery disease present. Stable cardiomegaly.  Tiny shotty subcentimeter nonspecific mediastinal lymph nodes are present. The thoracic esophagus is normal. Adrenals are normal. Prior cholecystectomy. Stable  intrahepatic and extrahepatic biliary distention, this is unchanged from prior CT of 11/14/2010. This is consistent with prior cholecystectomy. Correlation with liver/biliary function tests. Small stable hiatal hernia.  Large airways are patent. Tiny ill-defined 7 mm density in the right middle lobe is unchanged consistent with scar. Previously identified subtle patchy infiltrates in right upper lobe are not identified. Slight sclerosis is noted overlying the right second fourth ribs. This is stable from prior CT of 11/14/2010. The findings are most consistent with healed rib fractures. Minimal infiltrates suggesting pneumonitis noted in the left perihilar region, image number 27/series 5. Stable bibasilar and apical pleural parenchymal scarring.  Thyroid is normal. Supraclavicular axillary regions are normal. Asymmetric breast parenchymal densities with coarse calcifications again noted and unchanged. Correlation with mammography. Diffuse degenerative changes thoracic spine. Stable upper lumbar mild compression fracture, no change from 2012.  IMPRESSION: 1. Previously identified questionable ill-defined small infiltrates in the right upper lobe on chest x-ray 10/28/2013 represent overlying healed rib fractures. 2. Minimal left perihilar pneumonitis. 3. Coronary artery disease. 4. Cholecystectomy. Stable biliary distention, most likely secondary to post cholecystectomy state.   Electronically Signed   By: Marcello Moores  Register   On: 10/28/2013 12:51   Orders placed during the hospital encounter of 10/28/13  . EKG 12-LEAD #1- Sinus Tachycardia with inferior lateral ST depression with TWI (likely rate related repolarization changes)  . EKG 12-LEAD #2- NSR. No ST changes EKG 12-LEAD #3- SVT (rate 152) (with inferior lateral ST depression with TWI (likely rate related repolarization changes)    Assessment/Plan Active Problems: 1. Dyspnea secondary to probable Community acquired pneumonia/pneumonitis- I discussed  the CT findings with the radiologist who sees no evidence of PE. Findings are most consistent with pneumonitis/early pneumonia. We'll treat empirically with Rocephin and azithromycin to cover community-acquired pneumonia. Repeat chest x-ray to monitor for evolution of infiltrate. We'll use Xopenex as a bronchodilator and Robitussin-DM for cough. 2. Supraventricular tachycardia- SVT with rates into the 180s with minimal activity may be contributing to her dyspnea. This is likely secondary to underlying pulmonary process. We'll continue her beta blocker. Consult cardiology to assist with management as she is followed by Dr. Percival Spanish for CAD. 3. CAD, UNSPECIFIED SITE- low suspicion of cardiac ischemia. She  does have rate related ST changes on her EKG which are likely more repolarization changes. Rule out MI with serial cardiac enzymes. Further evaluation, if any deferred to cardiology. 4. Personal history of pulmonary embolism- no evidence of PE on CT with contrast. 5. Hyponatremia- secondary to underlying pulmonary process. Monitor with hydration and treatment. 6. Disposition-anticipate discharge to home in 2-3 days.   Delana Manganello,W DOUGLAS 10/28/2013, 2:23 PM

## 2013-10-28 NOTE — ED Notes (Signed)
Labs results reported to EDP.

## 2013-10-28 NOTE — ED Notes (Signed)
Pt undress into a gown, family at bedside, on the monitor

## 2013-10-28 NOTE — ED Notes (Signed)
N/v/d since Tuesday with a productive cough and low grade fever

## 2013-10-28 NOTE — ED Notes (Signed)
Changed pt's gown and top sheet, got wet from IV being put in

## 2013-10-28 NOTE — Progress Notes (Signed)
Patient resting quietly.  No complaints at this time.

## 2013-10-28 NOTE — ED Notes (Signed)
Pt back in room, on monitor, continuous bp cuff; continuous o2

## 2013-10-28 NOTE — ED Notes (Signed)
Patient transferred to x-ray for CT scan of the chest

## 2013-10-28 NOTE — ED Notes (Signed)
Back from x-ray patient placed on monitors

## 2013-10-28 NOTE — ED Notes (Signed)
Patient transferred up to 3 East Bed 7 via stretcher. Report called to [place] RN, NAD noted at the time of transfer.

## 2013-10-28 NOTE — ED Notes (Signed)
Admitting provider in to see patient. Vital Signs stable NAD noted, denies pain

## 2013-10-28 NOTE — Progress Notes (Signed)
Troponin called to me as 0.42. Dr Brigitte Pulse made aware. Ot voices no complaints

## 2013-10-28 NOTE — ED Notes (Signed)
Patient was assisted onto a bedpan and heart rate increased to 180's patient denies CP, ED provider notified and EKG was repeated, patient removed from bedpan and heart rate returned to 102. NAD noted skin warm and dry.

## 2013-10-28 NOTE — Consult Note (Signed)
Reason for Consult:Tachycardia Referring Physician: [name] is an 78 y.o. female.  HPI: [name] is a 78 year old female with history of CAD by coronary angiography. She has a known occlusion of the midLAD as well as diffuse disease of RCA. She has been managed medically for this. She also has a history of hypertension, peripheral arterial disease(moderate carotid disease), dyslipidemia, rheumatoid arthritis and DVT/PE. She presented to the ED with several days of non-productive cough and shortness of breath. The coughing spells and SOB are sometimes associated with racing heart. This prompted her to come to the ED. In the ED she did have an episode of tachycardia with rates around 140bpm. She had a CT scan that ruled out PE but demonstrated a pneumonitis. She was admitted tot he general medicine service. While on telemetry she has had 2 episodes of SVT with rates around 150 and bursts reported to be around 180bpm. She has been at rest during these episodes. They have self terminated. [name] states that she is active when in her normal state of health and is able to walk on the treadmill for 20-30 minutes. She does this 4 times per week. She does not have shortness of breath or chest pain with exertion or at rest. She has had prior episodes of racing heart that have occurred while at rest. She has never had associated syncope. At home she is on a good medical regimen of aspirin, plavix, imdur, losartan, lipitor and metoprolol.   Past Medical History  Diagnosis Date  . CAD, UNSPECIFIED SITE     2009. 99% proximal RCA stenosis followed by 80% distal stenosis. The LAD has a 100% stenosis in the midsegment. The circumflex is patent with distal occlusion. She has been managed medically. Her EF was well-preserved.  Marland Kitchen CAROTID STENOSIS   . DIVERTICULAR DISEASE   . Essential hypertension, benign   . HYPERLIPIDEMIA   . MACULAR DEGENERATION   . OSTEOPOROSIS   . Rheumatoid  arthritis(714.0)   . Hx of degenerative disc disease   . Gastritis   . Thyroid cancer 2008  . H/O blood clots 1984    in groin area that travels to lungs  . Gallstones 2009    Past Surgical History  Procedure Laterality Date  . Appendectomy  1959  . Tonsillectomy    . Abdominal hysterectomy  1984  . Im nailing femoral shaft retrograde  2010    left  . Wrist surgery  2004    Left  . Esophagogastroduodenoscopy    . Colonoscopy    . Breast cyst excision      left  . Cholecystectomy  2009    Family History  Problem Relation Age of Onset  . Heart attack Mother   . Heart disease Father   . Diabetes Father   . Ovarian cancer Maternal Aunt   . Clotting disorder Mother   . Lung cancer Brother     Social History:  reports that she has never smoked. She has never used smokeless tobacco. She reports that she does not drink alcohol or use illicit drugs.  Allergies:  Allergies  Allergen Reactions  . Hydrocodone Other (See Comments)    "feels weird"  . Prednisone Nausea And Vomiting    Oral    Medications: I have reviewed the patient's current medications.  Results for orders placed during the hospital encounter of 10/28/13 (from the past 48 hour(s))  CBC WITH DIFFERENTIAL     Status: Abnormal  Collection Time    10/28/13  9:23 AM      Result Value Range   WBC 4.1  4.0 - 10.5 K/uL   RBC 4.33  3.87 - 5.11 MIL/uL   Hemoglobin 14.2  12.0 - 15.0 g/dL   HCT 39.1  36.0 - 46.0 %   MCV 90.3  78.0 - 100.0 fL   MCH 32.8  26.0 - 34.0 pg   MCHC 36.3 (*) 30.0 - 36.0 g/dL   RDW 12.9  11.5 - 15.5 %   Platelets 167  150 - 400 K/uL   Neutrophils Relative % 66  43 - 77 %   Neutro Abs 2.7  1.7 - 7.7 K/uL   Lymphocytes Relative 10 (*) 12 - 46 %   Lymphs Abs 0.4 (*) 0.7 - 4.0 K/uL   Monocytes Relative 24 (*) 3 - 12 %   Monocytes Absolute 1.0  0.1 - 1.0 K/uL   Eosinophils Relative 1  0 - 5 %   Eosinophils Absolute 0.0  0.0 - 0.7 K/uL   Basophils Relative 0  0 - 1 %   Basophils  Absolute 0.0  0.0 - 0.1 K/uL  COMPREHENSIVE METABOLIC PANEL     Status: Abnormal   Collection Time    10/28/13  9:23 AM      Result Value Range   Sodium 129 (*) 137 - 147 mEq/L   Potassium 5.0  3.7 - 5.3 mEq/L   Chloride 89 (*) 96 - 112 mEq/L   CO2 25  19 - 32 mEq/L   Glucose, Bld 83  70 - 99 mg/dL   BUN 14  6 - 23 mg/dL   Creatinine, Ser 0.63  0.50 - 1.10 mg/dL   Calcium 8.8  8.4 - 10.5 mg/dL   Total Protein 9.4 (*) 6.0 - 8.3 g/dL   Albumin 4.0  3.5 - 5.2 g/dL   AST 51 (*) 0 - 37 U/L   ALT 31  0 - 35 U/L   Alkaline Phosphatase 90  39 - 117 U/L   Total Bilirubin 0.6  0.3 - 1.2 mg/dL   GFR calc non Af Amer 86 (*) >90 mL/min   GFR calc Af Amer >90  >90 mL/min   Comment: (NOTE)     The eGFR has been calculated using the CKD EPI equation.     This calculation has not been validated in all clinical situations.     eGFR's persistently <90 mL/min signify possible Chronic Kidney     Disease.  TROPONIN I     Status: None   Collection Time    10/28/13  9:23 AM      Result Value Range   Troponin I <0.30  <0.30 ng/mL   Comment:            Due to the release kinetics of cTnI,     a negative result within the first hours     of the onset of symptoms does not rule out     myocardial infarction with certainty.     If myocardial infarction is still suspected,     repeat the test at appropriate intervals.  PRO B NATRIURETIC PEPTIDE     Status: Abnormal   Collection Time    10/28/13  9:23 AM      Result Value Range   Pro B Natriuretic peptide (BNP) 744.4 (*) 0 - 450 pg/mL  CG4 I-STAT (LACTIC ACID)     Status: None   Collection Time  10/28/13  9:36 AM      Result Value Range   Lactic Acid, Venous 1.67  0.5 - 2.2 mmol/L  URINALYSIS, ROUTINE W REFLEX MICROSCOPIC     Status: Abnormal   Collection Time    10/28/13  9:46 AM      Result Value Range   Color, Urine YELLOW  YELLOW   APPearance CLEAR  CLEAR   Specific Gravity, Urine 1.016  1.005 - 1.030   pH 6.0  5.0 - 8.0   Glucose, UA  NEGATIVE  NEGATIVE mg/dL   Hgb urine dipstick SMALL (*) NEGATIVE   Bilirubin Urine NEGATIVE  NEGATIVE   Ketones, ur 40 (*) NEGATIVE mg/dL   Protein, ur 30 (*) NEGATIVE mg/dL   Urobilinogen, UA 0.2  0.0 - 1.0 mg/dL   Nitrite NEGATIVE  NEGATIVE   Leukocytes, UA NEGATIVE  NEGATIVE  URINE MICROSCOPIC-ADD ON     Status: None   Collection Time    10/28/13  9:46 AM      Result Value Range   Squamous Epithelial / LPF RARE  RARE   WBC, UA 0-2  <3 WBC/hpf   RBC / HPF 0-2  <3 RBC/hpf   Bacteria, UA RARE  RARE  TROPONIN I     Status: Abnormal   Collection Time    10/28/13  4:50 PM      Result Value Range   Troponin I 0.42 (*) <0.30 ng/mL   Comment:            Due to the release kinetics of cTnI,     a negative result within the first hours     of the onset of symptoms does not rule out     myocardial infarction with certainty.     If myocardial infarction is still suspected,     repeat the test at appropriate intervals.     CRITICAL RESULT CALLED TO, READ BACK BY AND VERIFIED WITH:     GERHART P,RN [id number] 10/28/13 SCALES H    Dg Chest 2 View  10/28/2013   CLINICAL DATA:  Cough.  Fever.  Coronary artery disease.  EXAM: CHEST  2 VIEW  COMPARISON:  08/04/2009  FINDINGS: Moderate cardiomegaly is stable. No evidence of congestive heart failure. Changes of COPD again noted.  Two ill-defined nodular opacities are seen in the right upper and mid lung fields on the frontal projection. These were not seen on previous study. Neoplasm cannot be excluded.  IMPRESSION: New right upper and mid lung field nodular opacities. Neoplasm cannot be excluded. Chest CT without contrast recommended for further evaluation.  Stable cardiomegaly and COPD.   Electronically Signed   By: Earle Gell M.D.   On: 10/28/2013 11:20   Ct Chest W Contrast  10/28/2013   CLINICAL DATA:  Productive cough and low-grade fever.  EXAM: CT CHEST WITH CONTRAST  TECHNIQUE: Multidetector CT imaging of the chest was performed during intravenous  contrast administration.  CONTRAST:  68m OMNIPAQUE IOHEXOL 300 MG/ML  SOLN  COMPARISON:  Chest x-ray 10/28/2013.  Chest CT 11/14/2010.  FINDINGS: Atherosclerotic vascular disease noted in thoracic and upper abdominal aorta. No aneurysm. No dissection. Great vessels are unremarkable. Direct origin of the left vertebral artery is noted. This is a normal variant. Pulmonary arteries are normal. No pulmonary embolus. Coronary artery disease present. Stable cardiomegaly.  Tiny shotty subcentimeter nonspecific mediastinal lymph nodes are present. The thoracic esophagus is normal. Adrenals are normal. Prior cholecystectomy. Stable intrahepatic and extrahepatic biliary distention, this is unchanged  from prior CT of 11/14/2010. This is consistent with prior cholecystectomy. Correlation with liver/biliary function tests. Small stable hiatal hernia.  Large airways are patent. Tiny ill-defined 7 mm density in the right middle lobe is unchanged consistent with scar. Previously identified subtle patchy infiltrates in right upper lobe are not identified. Slight sclerosis is noted overlying the right second fourth ribs. This is stable from prior CT of 11/14/2010. The findings are most consistent with healed rib fractures. Minimal infiltrates suggesting pneumonitis noted in the left perihilar region, image number 27/series 5. Stable bibasilar and apical pleural parenchymal scarring.  Thyroid is normal. Supraclavicular axillary regions are normal. Asymmetric breast parenchymal densities with coarse calcifications again noted and unchanged. Correlation with mammography. Diffuse degenerative changes thoracic spine. Stable upper lumbar mild compression fracture, no change from 2012.  IMPRESSION: 1. Previously identified questionable ill-defined small infiltrates in the right upper lobe on chest x-ray 10/28/2013 represent overlying healed rib fractures. 2. Minimal left perihilar pneumonitis. 3. Coronary artery disease. 4.  Cholecystectomy. Stable biliary distention, most likely secondary to post cholecystectomy state.   Electronically Signed   By: Marcello Moores  Register   On: 10/28/2013 12:51   ECG- shows narrow complex regular tachycardia around 152 BPM. There appear to be regular atrial activity in lead v1 in a 2:1 conduction pattern. There are ST segment changes inferior and laterally that are likely rate related.   ROS Complete ROS negative except as noted in HPI  Blood pressure 127/67, pulse 91, temperature 97.8 F (36.6 C), temperature source Oral, resp. rate 20, height 5' 2"  (1.575 m), weight 142 lb 12.8 oz (64.774 kg), SpO2 99.00%. Physical Exam  Constitutional: She is oriented to person, place, and time. No distress.  Neck: No JVD present.  Cardiovascular: Normal rate, regular rhythm, normal heart sounds and intact distal pulses.   No extrasystoles are present.  No murmur heard. Pulses:      Carotid pulses are on the right side with bruit, and on the left side with bruit. Respiratory: Effort normal and breath sounds normal. She has no rales.  GI: Soft. She exhibits no distension. There is no tenderness. There is no rebound.  Musculoskeletal: She exhibits no edema.  Neurological: She is alert and oriented to person, place, and time.  Skin: Skin is warm and dry. She is not diaphoretic.    Assessment/Plan: [name] is a 78 year old female with 2 vessel coronary artery disease, peripheral arterial disease, hypertension, dyslipidemia and RA who was admitted for cough, and SOB that was found to have a pneumonitis. She has had episodes of SVT with rates around 150bpm. She had rate related ST segment changes during these episodes and labs support a Type 2 NSTEMI. The tachycardia appears regular and is probably a paroxysmal atrial flutter. Her troponin elevation is demand related and doesn't warrant anti-coagulation. I think given the paroxysmal nature of her arrhythmia a trial of increased beta-blockade is a good  first therapy. With Alyssa Litton's active lung issue, the atrial arrhythmia may be more difficult to control until the pneumonitis clears up.   SVT -I suspect this to be a paroxysmal atrial flutter with 2:1 AV block. -Initially would increase her metoprolol to see if rate control is effective.  -will need to discuss anticoagulation  Type 2 NSTEMI -Secondary to her SVT in the setting of 2 vessel CAD -No heparin/lovenox. -rate control  Coronary artery disease -her symptoms seem stable aside from the tachycardia -continue aspirin and plavix  Dyslipidemia -continue lipitor  Pneumonitis -  Treatment per primary team  Jerilee Hoh 10/28/2013, 6:55 PM

## 2013-10-28 NOTE — ED Notes (Signed)
Attempted to call report, unit took a message and will have the nurse to call Pod A

## 2013-10-28 NOTE — ED Provider Notes (Signed)
CSN: [id number]     Arrival date & time 10/28/13  0848 History   First MD Initiated Contact with Patient 10/28/13 [phone]     Chief Complaint  Patient presents with  . Nausea   (Consider location/radiation/quality/duration/timing/severity/associated sxs/prior Treatment) The history is provided by the patient and the spouse.   issue here complaining of nausea vomiting and diarrhea x2 days. Diarrhea has been watery and vomiting has been nonbilious. She also notes a nonproductive cough and low-grade subjective temperature. She has had a flu shot. No rashes noted. No headache or photophobia. Denies any dysuria or hematuria. Has used over-the-counter medications without relief. Nothing makes her symptoms better worse.  Past Medical History  Diagnosis Date  . CAD, UNSPECIFIED SITE     2009. 99% proximal RCA stenosis followed by 80% distal stenosis. The LAD has a 100% stenosis in the midsegment. The circumflex is patent with distal occlusion. She has been managed medically. Her EF was well-preserved.  Marland Kitchen CAROTID STENOSIS   . DIVERTICULAR DISEASE   . Essential hypertension, benign   . HYPERLIPIDEMIA   . MACULAR DEGENERATION   . OSTEOPOROSIS   . Rheumatoid arthritis(714.0)   . Hx of degenerative disc disease   . Gastritis   . Thyroid cancer 2008  . H/O blood clots 1984    in groin area that travels to lungs  . Gallstones 2009   Past Surgical History  Procedure Laterality Date  . Appendectomy  1959  . Tonsillectomy    . Abdominal hysterectomy  1984  . Im nailing femoral shaft retrograde  2010    left  . Wrist surgery  2004    Left  . Esophagogastroduodenoscopy    . Colonoscopy    . Breast cyst excision      left  . Cholecystectomy  2009   Family History  Problem Relation Age of Onset  . Heart attack Mother   . Heart disease Father   . Diabetes Father   . Ovarian cancer Maternal Aunt   . Clotting disorder Mother   . Lung cancer Brother    History  Substance Use Topics  .  Smoking status: Never Smoker   . Smokeless tobacco: Never Used  . Alcohol Use: No   OB History   Grav Para Term Preterm Abortions TAB SAB Ect Mult Living                 Review of Systems  All other systems reviewed and are negative.    Allergies  Hydrocodone and Prednisone  Home Medications   Current Outpatient Rx  Name  Route  Sig  Dispense  Refill  . Acetaminophen (TYLENOL PO)   Oral   Take 500 mg by mouth every 6 (six) hours as needed (pain).          Marland Kitchen aspirin 81 MG tablet   Oral   Take 81 mg by mouth daily.           Marland Kitchen atorvastatin (LIPITOR) 40 MG tablet   Oral   Take 40 mg by mouth daily.           . Calcium Carbonate-Vitamin D (CALCIUM + D PO)   Oral   Take by mouth daily.           . clopidogrel (PLAVIX) 75 MG tablet   Oral   Take 75 mg by mouth daily.           . fish oil-omega-3 fatty acids 1000 MG capsule   Oral  Take 1 g by mouth daily.          . fluticasone (FLONASE) 50 MCG/ACT nasal spray   Nasal   Place 2 sprays into the nose daily.           . isosorbide mononitrate (IMDUR) 30 MG 24 hr tablet   Oral   Take 30 mg by mouth daily.           Marland Kitchen levothyroxine (SYNTHROID) 100 MCG tablet   Oral   Take 100 mcg by mouth daily.           Marland Kitchen losartan (COZAAR) 25 MG tablet   Oral   Take 25 mg by mouth daily.           . metoprolol (TOPROL-XL) 50 MG 24 hr tablet   Oral   Take 50 mg by mouth 3 (three) times daily.           . Multiple Vitamins-Minerals (OCUVITE PRESERVISION PO)   Oral   Take by mouth 2 (two) times daily.           . multivitamin (THERAGRAN) per tablet   Oral   Take 1 tablet by mouth daily.           Bethann Humble Sulfate (EYE DROPS A/C OP)   Ophthalmic   Apply to eye.           . nitroGLYCERIN (NITROSTAT) 0.4 MG SL tablet   Sublingual   Place 1 tablet (0.4 mg total) under the tongue every 5 (five) minutes as needed.   25 tablet   6    BP 175/106  Pulse 146  Temp(Src) 98 F  (36.7 C) (Oral)  Resp 18  SpO2 97% Physical Exam  Nursing note and vitals reviewed. Constitutional: She is oriented to person, place, and time. She appears well-developed and well-nourished.  Non-toxic appearance. No distress.  HENT:  Head: Normocephalic and atraumatic.  Eyes: Conjunctivae, EOM and lids are normal. Pupils are equal, round, and reactive to light.  Neck: Normal range of motion. Neck supple. No tracheal deviation present. No mass present.  Cardiovascular: Normal rate, regular rhythm and normal heart sounds.  Exam reveals no gallop.   No murmur heard. Pulmonary/Chest: Effort normal and breath sounds normal. No stridor. No respiratory distress. She has no decreased breath sounds. She has no wheezes. She has no rhonchi. She has no rales.  Abdominal: Soft. Normal appearance and bowel sounds are normal. She exhibits no distension. There is no tenderness. There is no rebound and no CVA tenderness.  Musculoskeletal: Normal range of motion. She exhibits no edema and no tenderness.  Neurological: She is alert and oriented to person, place, and time. She has normal strength. No cranial nerve deficit or sensory deficit. GCS eye subscore is 4. GCS verbal subscore is 5. GCS motor subscore is 6.  Skin: Skin is warm and dry. No abrasion and no rash noted.  Psychiatric: She has a normal mood and affect. Her speech is normal and behavior is normal.    ED Course  Procedures (including critical care time) Labs Review Labs Reviewed  URINE CULTURE  CBC WITH DIFFERENTIAL  COMPREHENSIVE METABOLIC PANEL  URINALYSIS, ROUTINE W REFLEX MICROSCOPIC  TROPONIN I  PRO B NATRIURETIC PEPTIDE   Imaging Review No results found.  EKG Interpretation    Date/Time:  Saturday October 28 2013 09:05:48 EST Ventricular Rate:  144 PR Interval:  60 QRS Duration: 73 QT Interval:  289 QTC Calculation: 447 R Axis:  55 Text Interpretation:  Sinus tachycardia Repolarization abnormality, prob rate related  Confirmed by Mercy Malena  MD, Kyleen Villatoro ([id number]) on 10/28/2013 9:09:54 AM            MDM  No diagnosis found. Repeat ecg is as shown   Date: 10/28/2013  Rate: 91  Rhythm: normal sinus rhythm  QRS Axis: normal  Intervals: normal  ST/T Wave abnormalities: normal  Conduction Disutrbances:none  Narrative Interpretation:   Old EKG Reviewed: changes noted, improved    Patient transient episodes of SVT that resolved spontaneously without therapy. Chest CT consistent with pneumonia patient started on antibiotics and will be admitted to medicine.  Leota Jacobsen, MD 10/28/13 1336

## 2013-10-29 ENCOUNTER — Inpatient Hospital Stay (HOSPITAL_COMMUNITY): Payer: Medicare Other

## 2013-10-29 DIAGNOSIS — R7989 Other specified abnormal findings of blood chemistry: Secondary | ICD-10-CM

## 2013-10-29 DIAGNOSIS — I498 Other specified cardiac arrhythmias: Secondary | ICD-10-CM

## 2013-10-29 DIAGNOSIS — I2510 Atherosclerotic heart disease of native coronary artery without angina pectoris: Secondary | ICD-10-CM

## 2013-10-29 DIAGNOSIS — D72819 Decreased white blood cell count, unspecified: Secondary | ICD-10-CM | POA: Diagnosis not present

## 2013-10-29 DIAGNOSIS — R799 Abnormal finding of blood chemistry, unspecified: Secondary | ICD-10-CM

## 2013-10-29 DIAGNOSIS — R778 Other specified abnormalities of plasma proteins: Secondary | ICD-10-CM | POA: Diagnosis not present

## 2013-10-29 DIAGNOSIS — J189 Pneumonia, unspecified organism: Secondary | ICD-10-CM

## 2013-10-29 HISTORY — DX: Other specified abnormal findings of blood chemistry: R79.89

## 2013-10-29 LAB — TROPONIN I
Troponin I: 0.36 ng/mL (ref <0.30)
Troponin I: 0.58 ng/mL (ref <0.30)

## 2013-10-29 LAB — CBC
HEMATOCRIT: 37.1 % (ref 36.0–46.0)
HEMOGLOBIN: 12.7 g/dL (ref 12.0–15.0)
MCH: 31.2 pg (ref 26.0–34.0)
MCHC: 34.2 g/dL (ref 30.0–36.0)
MCV: 91.2 fL (ref 78.0–100.0)
Platelets: 167 10*3/uL (ref 150–400)
RBC: 4.07 MIL/uL (ref 3.87–5.11)
RDW: 13.3 % (ref 11.5–15.5)
WBC: 2.8 10*3/uL — ABNORMAL LOW (ref 4.0–10.5)

## 2013-10-29 LAB — BASIC METABOLIC PANEL
BUN: 11 mg/dL (ref 6–23)
CHLORIDE: 94 meq/L — AB (ref 96–112)
CO2: 24 mEq/L (ref 19–32)
Calcium: 7.8 mg/dL — ABNORMAL LOW (ref 8.4–10.5)
Creatinine, Ser: 0.69 mg/dL (ref 0.50–1.10)
GFR calc non Af Amer: 83 mL/min — ABNORMAL LOW (ref >90)
Glucose, Bld: 80 mg/dL (ref 70–99)
POTASSIUM: 4.3 meq/L (ref 3.7–5.3)
Sodium: 130 mEq/L — ABNORMAL LOW (ref 137–147)

## 2013-10-29 LAB — SAVE SMEAR

## 2013-10-29 MED ORDER — LEVALBUTEROL HCL 0.63 MG/3ML IN NEBU: 0.6300 mg | INHALATION_SOLUTION | Freq: Three times a day (TID) | RESPIRATORY_TRACT | Status: DC

## 2013-10-29 MED ORDER — LEVALBUTEROL HCL 0.63 MG/3ML IN NEBU: 0.6300 mg | INHALATION_SOLUTION | RESPIRATORY_TRACT | Status: DC | PRN

## 2013-10-29 NOTE — Progress Notes (Signed)
Primary cardiologist: Dr. Percival Spanish  Subjective:  Feeling better today. Less short of breath. She does feel her palpitations when her heart is racing.  Objective:  Vital Signs in the last 24 hours: Temp:  [97.8 F (36.6 C)-98.9 F (37.2 C)] 98.6 F (37 C) (01/25 0600) Pulse Rate:  [76-153] 89 (01/25 0600) Resp:  [17-27] 18 (01/25 0600) BP: (127-182)/(59-101) 149/63 mmHg (01/25 0600) SpO2:  [93 %-99 %] 98 % (01/25 0600) Weight:  [142 lb 12.8 oz (64.774 kg)-143 lb 8.3 oz (65.1 kg)] 143 lb 8.3 oz (65.1 kg) (01/25 0423)  Intake/Output from previous day: 01/24 0701 - 01/25 0700 In: 240 [P.O.:240] Out: 700 [Urine:700]   Physical Exam: General: Well developed, well nourished, in no acute distress. Head:  Normocephalic and atraumatic. Lungs: Clear to auscultation and percussion. Heart: Normal S1 and S2.  No murmur, rubs or gallops.  Abdomen: soft, non-tender, positive bowel sounds. Extremities: No clubbing or cyanosis. No edema. Neurologic: Alert and oriented x 3.    Lab Results:  Recent Labs  10/28/13 0923 10/29/13 0610  WBC 4.1 2.8*  HGB 14.2 12.7  PLT 167 167    Recent Labs  10/28/13 0923 10/29/13 0610  NA 129* 130*  K 5.0 4.3  CL 89* 94*  CO2 25 24  GLUCOSE 83 80  BUN 14 11  CREATININE 0.63 0.69    Recent Labs  10/28/13 2335 10/29/13 0610  TROPONINI 0.58* 0.36*   Hepatic Function Panel  Recent Labs  10/28/13 0923  PROT 9.4*  ALBUMIN 4.0  AST 51*  ALT 31  ALKPHOS 90  BILITOT 0.6   Imaging: Dg Chest 2 View  10/28/2013   CLINICAL DATA:  Cough.  Fever.  Coronary artery disease.  EXAM: CHEST  2 VIEW  COMPARISON:  08/04/2009  FINDINGS: Moderate cardiomegaly is stable. No evidence of congestive heart failure. Changes of COPD again noted.  Two ill-defined nodular opacities are seen in the right upper and mid lung fields on the frontal projection. These were not seen on previous study. Neoplasm cannot be excluded.  IMPRESSION: New right upper and mid  lung field nodular opacities. Neoplasm cannot be excluded. Chest CT without contrast recommended for further evaluation.  Stable cardiomegaly and COPD.   Electronically Signed   By: Earle Gell M.D.   On: 10/28/2013 11:20   Ct Chest W Contrast  10/28/2013   CLINICAL DATA:  Productive cough and low-grade fever.  EXAM: CT CHEST WITH CONTRAST  TECHNIQUE: Multidetector CT imaging of the chest was performed during intravenous contrast administration.  CONTRAST:  32mL OMNIPAQUE IOHEXOL 300 MG/ML  SOLN  COMPARISON:  Chest x-ray 10/28/2013.  Chest CT 11/14/2010.  FINDINGS: Atherosclerotic vascular disease noted in thoracic and upper abdominal aorta. No aneurysm. No dissection. Great vessels are unremarkable. Direct origin of the left vertebral artery is noted. This is a normal variant. Pulmonary arteries are normal. No pulmonary embolus. Coronary artery disease present. Stable cardiomegaly.  Tiny shotty subcentimeter nonspecific mediastinal lymph nodes are present. The thoracic esophagus is normal. Adrenals are normal. Prior cholecystectomy. Stable intrahepatic and extrahepatic biliary distention, this is unchanged from prior CT of 11/14/2010. This is consistent with prior cholecystectomy. Correlation with liver/biliary function tests. Small stable hiatal hernia.  Large airways are patent. Tiny ill-defined 7 mm density in the right middle lobe is unchanged consistent with scar. Previously identified subtle patchy infiltrates in right upper lobe are not identified. Slight sclerosis is noted overlying the right second fourth ribs. This is stable from prior CT  of 11/14/2010. The findings are most consistent with healed rib fractures. Minimal infiltrates suggesting pneumonitis noted in the left perihilar region, image number 27/series 5. Stable bibasilar and apical pleural parenchymal scarring.  Thyroid is normal. Supraclavicular axillary regions are normal. Asymmetric breast parenchymal densities with coarse calcifications  again noted and unchanged. Correlation with mammography. Diffuse degenerative changes thoracic spine. Stable upper lumbar mild compression fracture, no change from 2012.  IMPRESSION: 1. Previously identified questionable ill-defined small infiltrates in the right upper lobe on chest x-ray 10/28/2013 represent overlying healed rib fractures. 2. Minimal left perihilar pneumonitis. 3. Coronary artery disease. 4. Cholecystectomy. Stable biliary distention, most likely secondary to post cholecystectomy state.   Electronically Signed   By: Marcello Moores  Register   On: 10/28/2013 12:51   Personally viewed.   Telemetry: Long RP tachycardia, likely atrial tachycardia intermittent. Personally viewed.   EKG:  Long RP tachycardia rate 152 with marked ST segment changes/depression.  Cardiac Studies:  Echocardiogram 2010-normal EF 55%  Assessment/Plan:   78 year old with paroxysmal supraventricular tachycardia, long RP tachycardia, possible atrial tachycardia with mildly elevated troponin in this setting, previous normal EF with pneumonia.  1. SVT-heart rates were recorded up to 150 with minimal activity. Likely underlying pulmonary process exacerbating. We will continue to try beta blocker. She is currently on dose of 75 mg by mouth 3 times a day of Toprol. Was taking 50 3 times a day at home. Hopefully as pulmonary situation resolves/improves, her SVT will improve as well.  2. Elevated troponin-minimally elevated. Setting of SVT. Currently, continue with medical therapy. No evidence of PE on CT scan.  3. Prior CAD-previous catheterization reviewed. Medical management. Preserved EF.  We will follow.   Dorrine Montone, [place] 10/29/2013, 12:19 PM

## 2013-10-29 NOTE — Progress Notes (Signed)
Subjective: Reports improvement in shortness of breath.  Intermittent heart racing.  Cough persists but now more productive of yellow sputum.  Objective: Vital signs in last 24 hours: Temp:  [97.8 F (36.6 C)-98.9 F (37.2 C)] 98.6 F (37 C) (01/25 0600) Pulse Rate:  [76-153] 89 (01/25 0600) Resp:  [16-27] 18 (01/25 0600) BP: (127-189)/(59-101) 149/63 mmHg (01/25 0600) SpO2:  [93 %-99 %] 98 % (01/25 0600) Weight:  [64.774 kg (142 lb 12.8 oz)-65.1 kg (143 lb 8.3 oz)] 65.1 kg (143 lb 8.3 oz) (01/25 0423) Weight change:  Last BM Date: 10/27/13  CBG (last 3)  No results found for this basename: GLUCAP,  in the last 72 hours  Intake/Output from previous day: 01/24 0701 - 01/25 0700 In: 240 [P.O.:240] Out: 700 [Urine:700] Intake/Output this shift:    General appearance: alert and no distress; looks better Eyes: no scleral icterus Throat: oropharynx moist without erythema Resp: clear to auscultation bilaterally Cardio: regular rate and rhythm GI: soft, non-tender; bowel sounds normal; no masses,  no organomegaly Extremities: no clubbing, cyanosis or edema   Lab Results:  Recent Labs  10/28/13 0923 10/29/13 0610  NA 129* 130*  K 5.0 4.3  CL 89* 94*  CO2 25 24  GLUCOSE 83 80  BUN 14 11  CREATININE 0.63 0.69  CALCIUM 8.8 7.8*    Recent Labs  10/28/13 0923  AST 51*  ALT 31  ALKPHOS 90  BILITOT 0.6  PROT 9.4*  ALBUMIN 4.0    Recent Labs  10/28/13 0923 10/29/13 0610  WBC 4.1 2.8*  NEUTROABS 2.7  --   HGB 14.2 12.7  HCT 39.1 37.1  MCV 90.3 91.2  PLT 167 167   Lab Results  Component Value Date   INR 2.10* 08/05/2009   INR 1.93* 08/04/2009   INR 1.71* 08/03/2009    Recent Labs  10/28/13 1650 10/28/13 2335 10/29/13 0610  TROPONINI 0.42* 0.58* 0.36*   No results found for this basename: TSH, T4TOTAL, FREET3, T3FREE, THYROIDAB,  in the last 72 hours No results found for this basename: VITAMINB12, FOLATE, FERRITIN, TIBC, IRON, RETICCTPCT,  in the  last 72 hours  Studies/Results: Dg Chest 2 View  10/28/2013   CLINICAL DATA:  Cough.  Fever.  Coronary artery disease.  EXAM: CHEST  2 VIEW  COMPARISON:  08/04/2009  FINDINGS: Moderate cardiomegaly is stable. No evidence of congestive heart failure. Changes of COPD again noted.  Two ill-defined nodular opacities are seen in the right upper and mid lung fields on the frontal projection. These were not seen on previous study. Neoplasm cannot be excluded.  IMPRESSION: New right upper and mid lung field nodular opacities. Neoplasm cannot be excluded. Chest CT without contrast recommended for further evaluation.  Stable cardiomegaly and COPD.   Electronically Signed   By: Earle Gell M.D.   On: 10/28/2013 11:20   Ct Chest W Contrast  10/28/2013   CLINICAL DATA:  Productive cough and low-grade fever.  EXAM: CT CHEST WITH CONTRAST  TECHNIQUE: Multidetector CT imaging of the chest was performed during intravenous contrast administration.  CONTRAST:  32mL OMNIPAQUE IOHEXOL 300 MG/ML  SOLN  COMPARISON:  Chest x-ray 10/28/2013.  Chest CT 11/14/2010.  FINDINGS: Atherosclerotic vascular disease noted in thoracic and upper abdominal aorta. No aneurysm. No dissection. Great vessels are unremarkable. Direct origin of the left vertebral artery is noted. This is a normal variant. Pulmonary arteries are normal. No pulmonary embolus. Coronary artery disease present. Stable cardiomegaly.  Tiny shotty subcentimeter nonspecific mediastinal  lymph nodes are present. The thoracic esophagus is normal. Adrenals are normal. Prior cholecystectomy. Stable intrahepatic and extrahepatic biliary distention, this is unchanged from prior CT of 11/14/2010. This is consistent with prior cholecystectomy. Correlation with liver/biliary function tests. Small stable hiatal hernia.  Large airways are patent. Tiny ill-defined 7 mm density in the right middle lobe is unchanged consistent with scar. Previously identified subtle patchy infiltrates in  right upper lobe are not identified. Slight sclerosis is noted overlying the right second fourth ribs. This is stable from prior CT of 11/14/2010. The findings are most consistent with healed rib fractures. Minimal infiltrates suggesting pneumonitis noted in the left perihilar region, image number 27/series 5. Stable bibasilar and apical pleural parenchymal scarring.  Thyroid is normal. Supraclavicular axillary regions are normal. Asymmetric breast parenchymal densities with coarse calcifications again noted and unchanged. Correlation with mammography. Diffuse degenerative changes thoracic spine. Stable upper lumbar mild compression fracture, no change from 2012.  IMPRESSION: 1. Previously identified questionable ill-defined small infiltrates in the right upper lobe on chest x-ray 10/28/2013 represent overlying healed rib fractures. 2. Minimal left perihilar pneumonitis. 3. Coronary artery disease. 4. Cholecystectomy. Stable biliary distention, most likely secondary to post cholecystectomy state.   Electronically Signed   By: Marcello Moores  Register   On: 10/28/2013 12:51     Medications: Scheduled: . aspirin EC  81 mg Oral Daily  . atorvastatin  40 mg Oral q1800  . azithromycin  500 mg Intravenous Q24H  . calcium-vitamin D  1 tablet Oral BID WC  . cefTRIAXone (ROCEPHIN)  IV  1 g Intravenous Q24H  . clopidogrel  75 mg Oral Q breakfast  . enoxaparin (LOVENOX) injection  40 mg Subcutaneous Q24H  . fluticasone  2 spray Each Nare Daily  . isosorbide mononitrate  30 mg Oral Daily  . levothyroxine  100 mcg Oral QAC breakfast  . losartan  25 mg Oral Daily  . metoprolol succinate  75 mg Oral TID  . multivitamin with minerals  1 tablet Oral Daily  . multivitamin-lutein  1 capsule Oral BID  . omega-3 acid ethyl esters  1 g Oral Daily  . sodium chloride  3 mL Intravenous Q12H   Continuous: . sodium chloride 75 mL/hr at 10/28/13 1659    Assessment/Plan: Active Problems:  1. Dyspnea secondary to probable  Community acquired pneumonia/pneumonitis-continue Rocephin/Azithromycin.  Repeat CXR this am to evaluate for evolution after hydration. 2. Supraventricular tachycardia- SVT with rates into the 180s with minimal activity may be contributing to her dyspnea. This is likely secondary to underlying pulmonary process. We'll continue her beta blocker. Appreciate Cariology assistance 3. CAD with elevated Troponin-likely supply-demand ischemia in setting of pulmonary process and SVT.  Continue medical therapy. Further management per Cardiology. 4. Personal history of pulmonary embolism- no evidence of PE on CT with contrast.  5. Hyponatremia- slightly improved.  Secondary to underlying pulmonary process, ?SIADH. Monitor with hydration and treatment.  6. Leukopenia- may be related to underlying infection.  Monitor and consider further evaluation if persistent.  Will send peripheral blood smear. 7. Disposition-anticipate discharge to home in 1-2 days.   LOS: 1 day   Rayan Dyal,W DOUGLAS 10/29/2013, 9:18 AM

## 2013-10-30 DIAGNOSIS — E039 Hypothyroidism, unspecified: Secondary | ICD-10-CM | POA: Diagnosis present

## 2013-10-30 DIAGNOSIS — I471 Supraventricular tachycardia: Principal | ICD-10-CM

## 2013-10-30 DIAGNOSIS — G459 Transient cerebral ischemic attack, unspecified: Secondary | ICD-10-CM | POA: Diagnosis present

## 2013-10-30 LAB — CBC
HEMATOCRIT: 39 % (ref 36.0–46.0)
HEMOGLOBIN: 13.6 g/dL (ref 12.0–15.0)
MCH: 32.1 pg (ref 26.0–34.0)
MCHC: 34.9 g/dL (ref 30.0–36.0)
MCV: 92 fL (ref 78.0–100.0)
Platelets: 162 10*3/uL (ref 150–400)
RBC: 4.24 MIL/uL (ref 3.87–5.11)
RDW: 13.3 % (ref 11.5–15.5)
WBC: 3.6 10*3/uL — ABNORMAL LOW (ref 4.0–10.5)

## 2013-10-30 LAB — BASIC METABOLIC PANEL
BUN: 6 mg/dL (ref 6–23)
CO2: 26 meq/L (ref 19–32)
Calcium: 8.2 mg/dL — ABNORMAL LOW (ref 8.4–10.5)
Chloride: 93 mEq/L — ABNORMAL LOW (ref 96–112)
Creatinine, Ser: 0.63 mg/dL (ref 0.50–1.10)
GFR calc Af Amer: >90 mL/min (ref >90)
GFR calc non Af Amer: 86 mL/min — ABNORMAL LOW (ref >90)
Glucose, Bld: 87 mg/dL (ref 70–99)
Potassium: 4.6 mEq/L (ref 3.7–5.3)
SODIUM: 133 meq/L — AB (ref 137–147)

## 2013-10-30 MED ORDER — METHYLPREDNISOLONE ACETATE 40 MG/ML IJ SUSP
40.0000 mg | Freq: Once | INTRAMUSCULAR | Status: DC
  Filled 2013-10-30: qty 1

## 2013-10-30 MED ORDER — METHYLPREDNISOLONE SODIUM SUCC 125 MG IJ SOLR
60.0000 mg | Freq: Once | INTRAMUSCULAR | Status: AC
  Administered 2013-10-30: 60 mg via INTRAVENOUS
  Filled 2013-10-30: qty 0.96

## 2013-10-30 MED ORDER — DILTIAZEM HCL 100 MG IV SOLR
5.0000 mg/h | INTRAVENOUS | Status: DC
  Filled 2013-10-30: qty 100

## 2013-10-30 NOTE — Progress Notes (Signed)
UR completed Ilynn Stauffer K. Maxden Naji, RN, BSN, [place], CCM  10/30/2013 4:48 PM

## 2013-10-30 NOTE — Consult Note (Signed)
Reason for Consult: Recurrent SVT  Referring Physician: Dr. Volanda Napoleon is an 78 y.o. female.   HPI: The patient is a 78 year old woman with a history of coronary artery disease, with preserved left ventricular function, dyslipidemia, hypertension, and carotid vascular disease. She was admitted to the hospital with pneumonia. She has had intermittent episodes of SVT at rates of up to 140 beats per minute. These are associated with palpitations, and slight increasing dyspnea. She denies anginal symptoms. She thinks that she has had these episodes at home, but has never sought medical attention. They have typically not been sustained. In the setting of pneumonia, and on telemetry, her episodes have been more apparent. They start and stop suddenly.  PMH: Past Medical History  Diagnosis Date  . CAD, UNSPECIFIED SITE     2009. 99% proximal RCA stenosis followed by 80% distal stenosis. The LAD has a 100% stenosis in the midsegment. The circumflex is patent with distal occlusion. She has been managed medically. Her EF was well-preserved.  Marland Kitchen CAROTID STENOSIS   . DIVERTICULAR DISEASE   . Essential hypertension, benign   . HYPERLIPIDEMIA   . MACULAR DEGENERATION   . OSTEOPOROSIS   . Rheumatoid arthritis(714.0)   . Hx of degenerative disc disease   . Gastritis   . Thyroid cancer 2008  . H/O blood clots 1984    in groin area that travels to lungs  . Gallstones 2009    PSHX: Past Surgical History  Procedure Laterality Date  . Appendectomy  1959  . Tonsillectomy    . Abdominal hysterectomy  1984  . Im nailing femoral shaft retrograde  2010    left  . Wrist surgery  2004    Left  . Esophagogastroduodenoscopy    . Colonoscopy    . Breast cyst excision      left  . Cholecystectomy  2009    FAMHX: Family History  Problem Relation Age of Onset  . Heart attack Mother   . Heart disease Father   . Diabetes Father   . Ovarian cancer Maternal Aunt   . Clotting  disorder Mother   . Lung cancer Brother     Social History:  reports that she has never smoked. She has never used smokeless tobacco. She reports that she does not drink alcohol or use illicit drugs.  Allergies:  Allergies  Allergen Reactions  . Hydrocodone Other (See Comments)    "feels weird"  . Prednisone Nausea And Vomiting    Oral    Medications: Reviewed  X-ray Chest Pa And Lateral   10/29/2013   CLINICAL DATA:  Cough  EXAM: CHEST  2 VIEW  COMPARISON:  10/28/2013  FINDINGS: Cardiomegaly. Lungs hyper aerated and clear. No pneumothorax. No pleural effusion  IMPRESSION: Cardiomegaly without decompensation.   Electronically Signed   By: Maryclare Bean M.D.   On: 10/29/2013 15:10    ROS  As stated in the HPI and negative for all other systems.  Physical Exam  Vitals:Blood pressure 133/66, pulse 70, temperature 98.3 F (36.8 C), temperature source Oral, resp. rate 18, height 5\' 2"  (1.575 m), weight 144 lb 3.2 oz (65.409 kg), SpO2 97.00%.  Well appearing 78 year old woman,  NAD HEENT: Unremarkable Neck:  No JVD, no thyromegally Back:  No CVA tenderness Lungs:  Clear with no wheezes, rales, or rhonchi. HEART:  Regular rate rhythm, no murmurs, no rubs, no clicks Abd:  soft, positive bowel sounds, no organomegally, no rebound, no guarding Ext:  2  plus pulses, no edema, no cyanosis, no clubbing Skin:  No rashes no nodules Neuro:  CN II through XII intact, motor grossly intact  Telemetry - normal sinus rhythm with intermittent SVT  ECG - normal sinus rhythm as well as episodes of SVT, which appears to be atrial tachycardia  Chest x-ray - reviewed  Labs - reviewed  Assessment/Plan: 1. recurrent SVT 2. known coronary disease, three-vessel, with preserved left ventricular function 3. community-acquired pneumonia on intravenous antibiotics Recommendation: I discussed the treatment options with the patient. She is not particular symptomatic, despite her coronary disease. I  suspect the pneumonia has exacerbated her episodes of SVT. I would expect her to feel badly in SVT in the setting of coronary artery disease as she has documented. Catheter ablation of her SVT was discussed along with medical therapy. Her beta blocker therapy will be continued. She is not a good candidate for additional antiarrhythmic drug therapy based on her other comorbidities. Flecainide would be contraindicated. If her SVT worsens, or becomes more symptomatic, then catheter ablation would be recommended. Based on her electrocardiogram, it appears that she has an atrial tachycardia originating low in the right atrium. Unusual AV node reentry could also be a diagnosis. She has no ventricular preexcitation.  Carleene Overlie TaylorMD 10/30/2013, 7:32 PM

## 2013-10-30 NOTE — Progress Notes (Signed)
Subjective: Still some coughing paroxysms. Heart goes fast with these. Eating OK. Some loose stools. No chills or sweats. Still dyspneic   Objective: Vital signs in last 24 hours: Temp:  [97.5 F (36.4 C)-98.4 F (36.9 C)] 98.4 F (36.9 C) (01/26 0524) Pulse Rate:  [71-80] 79 (01/26 0524) Resp:  [19-20] 19 (01/26 0524) BP: (104-164)/(58-71) 164/71 mmHg (01/26 0524) SpO2:  [97 %-98 %] 97 % (01/26 0524) Weight:  [65.409 kg (144 lb 3.2 oz)] 65.409 kg (144 lb 3.2 oz) (01/26 0250)  Intake/Output from previous day: 01/25 0701 - 01/26 0700 In: 720 [P.O.:720] Out: 2102 [Urine:2100; Stool:2] Intake/Output this shift:    Sitting up in no distress. O2 in place. Face symmetric. bilat wheeze and rhonchi. Ht regular no skips abd soft NT. No edema. Pulses OK> awake. mentating well  Lab Results   Recent Labs  10/29/13 0610 10/30/13 0400  WBC 2.8* 3.6*  RBC 4.07 4.24  HGB 12.7 13.6  HCT 37.1 39.0  MCV 91.2 92.0  MCH 31.2 32.1  RDW 13.3 13.3  PLT 167 162    Recent Labs  10/29/13 0610 10/30/13 0400  NA 130* 133*  K 4.3 4.6  CL 94* 93*  CO2 24 26  GLUCOSE 80 87  BUN 11 6  CREATININE 0.69 0.63  CALCIUM 7.8* 8.2*    Studies/Results: X-ray Chest Pa And Lateral   10/29/2013   CLINICAL DATA:  Cough  EXAM: CHEST  2 VIEW  COMPARISON:  10/28/2013  FINDINGS: Cardiomegaly. Lungs hyper aerated and clear. No pneumothorax. No pleural effusion  IMPRESSION: Cardiomegaly without decompensation.   Electronically Signed   By: Maryclare Bean M.D.   On: 10/29/2013 15:10   Dg Chest 2 View  10/28/2013   CLINICAL DATA:  Cough.  Fever.  Coronary artery disease.  EXAM: CHEST  2 VIEW  COMPARISON:  08/04/2009  FINDINGS: Moderate cardiomegaly is stable. No evidence of congestive heart failure. Changes of COPD again noted.  Two ill-defined nodular opacities are seen in the right upper and mid lung fields on the frontal projection. These were not seen on previous study. Neoplasm cannot be excluded.  IMPRESSION:  New right upper and mid lung field nodular opacities. Neoplasm cannot be excluded. Chest CT without contrast recommended for further evaluation.  Stable cardiomegaly and COPD.   Electronically Signed   By: Earle Gell M.D.   On: 10/28/2013 11:20   Ct Chest W Contrast  10/28/2013   CLINICAL DATA:  Productive cough and low-grade fever.  EXAM: CT CHEST WITH CONTRAST  TECHNIQUE: Multidetector CT imaging of the chest was performed during intravenous contrast administration.  CONTRAST:  54mL OMNIPAQUE IOHEXOL 300 MG/ML  SOLN  COMPARISON:  Chest x-ray 10/28/2013.  Chest CT 11/14/2010.  FINDINGS: Atherosclerotic vascular disease noted in thoracic and upper abdominal aorta. No aneurysm. No dissection. Great vessels are unremarkable. Direct origin of the left vertebral artery is noted. This is a normal variant. Pulmonary arteries are normal. No pulmonary embolus. Coronary artery disease present. Stable cardiomegaly.  Tiny shotty subcentimeter nonspecific mediastinal lymph nodes are present. The thoracic esophagus is normal. Adrenals are normal. Prior cholecystectomy. Stable intrahepatic and extrahepatic biliary distention, this is unchanged from prior CT of 11/14/2010. This is consistent with prior cholecystectomy. Correlation with liver/biliary function tests. Small stable hiatal hernia.  Large airways are patent. Tiny ill-defined 7 mm density in the right middle lobe is unchanged consistent with scar. Previously identified subtle patchy infiltrates in right upper lobe are not identified. Slight sclerosis is noted  overlying the right second fourth ribs. This is stable from prior CT of 11/14/2010. The findings are most consistent with healed rib fractures. Minimal infiltrates suggesting pneumonitis noted in the left perihilar region, image number 27/series 5. Stable bibasilar and apical pleural parenchymal scarring.  Thyroid is normal. Supraclavicular axillary regions are normal. Asymmetric breast parenchymal densities  with coarse calcifications again noted and unchanged. Correlation with mammography. Diffuse degenerative changes thoracic spine. Stable upper lumbar mild compression fracture, no change from 2012.  IMPRESSION: 1. Previously identified questionable ill-defined small infiltrates in the right upper lobe on chest x-ray 10/28/2013 represent overlying healed rib fractures. 2. Minimal left perihilar pneumonitis. 3. Coronary artery disease. 4. Cholecystectomy. Stable biliary distention, most likely secondary to post cholecystectomy state.   Electronically Signed   By: Marcello Moores  Register   On: 10/28/2013 12:51    Scheduled Meds: . aspirin EC  81 mg Oral Daily  . atorvastatin  40 mg Oral q1800  . azithromycin  500 mg Intravenous Q24H  . calcium-vitamin D  1 tablet Oral BID WC  . cefTRIAXone (ROCEPHIN)  IV  1 g Intravenous Q24H  . clopidogrel  75 mg Oral Q breakfast  . enoxaparin (LOVENOX) injection  40 mg Subcutaneous Q24H  . fluticasone  2 spray Each Nare Daily  . isosorbide mononitrate  30 mg Oral Daily  . levothyroxine  100 mcg Oral QAC breakfast  . losartan  25 mg Oral Daily  . metoprolol succinate  75 mg Oral TID  . multivitamin with minerals  1 tablet Oral Daily  . multivitamin-lutein  1 capsule Oral BID  . omega-3 acid ethyl esters  1 g Oral Daily  . sodium chloride  3 mL Intravenous Q12H   Continuous Infusions: . sodium chloride 75 mL/hr at 10/28/13 1659   PRN Meds:acetaminophen, acetaminophen, alum & mag hydroxide-simeth, guaiFENesin-dextromethorphan, levalbuterol, nitroGLYCERIN, ondansetron (ZOFRAN) IV, ondansetron  Assessment/Plan: 1. Dyspnea secondary to probable Community acquired pneumonia/pneumonitis-doing OK. XR shows a small perihilar inflitrate. No fever. 2. Supraventricular tachycardia- still some runs. Cards on board 3. CAD with elevated Troponin-likely supply-demand ischemia in setting of pulmonary process and SVT. Continue medical therapy. Further management per Cardiology.   4. Personal history of pulmonary embolism- no evidence of PE on CT with contrast.  5. Hyponatremia- getting better 6. Leukopenia- sl better 7. Disposition-anticipate discharge to home in 1-2 days.   LOS: 2 days   [name] 10/30/2013, 8:26 AM

## 2013-10-30 NOTE — Progress Notes (Signed)
Pt's HR sustaining 140's up to 150 for at least 10 min after getting up into chair.  Pt says she can feel the palpations.  Pt BP 114/69, notified PA, order given to start Cardizem gtt at 5 mg/hr and give loading dose of 10 mg.  Shortly after order put in, pt's HR went down to the 80's sustaining.  Notified MD and order given to hold Cardizem for now.  Will carry out MD orders and continue to monitor.

## 2013-10-30 NOTE — Progress Notes (Signed)
Subjective:  Still complaining of cough. She is still having symptomatic "chest pressure" PSVT.  Objective:  Vital Signs in the last 24 hours: Temp:  [97.5 F (36.4 C)-98.4 F (36.9 C)] 98.4 F (36.9 C) (01/26 0524) Pulse Rate:  [71-80] 79 (01/26 0524) Resp:  [19-20] 19 (01/26 0524) BP: (104-164)/(58-71) 164/71 mmHg (01/26 0524) SpO2:  [97 %-98 %] 97 % (01/26 0524) Weight:  [144 lb 3.2 oz (65.409 kg)] 144 lb 3.2 oz (65.409 kg) (01/26 0250)  Intake/Output from previous day:  Intake/Output Summary (Last 24 hours) at 10/30/13 0832 Last data filed at 10/30/13 0830  Gross per 24 hour  Intake    840 ml  Output   2102 ml  Net  -1262 ml    Physical Exam: General appearance: alert, cooperative, fatigued and no distress Lungs: end expiratory wheezing Heart: regular rate and rhythm   Rate: 76  Rhythm: normal sinus rhythm and runs of tachycardia- PSVT vs 2:1 A flutter  Lab Results:  Recent Labs  10/29/13 0610 10/30/13 0400  WBC 2.8* 3.6*  HGB 12.7 13.6  PLT 167 162    Recent Labs  10/29/13 0610 10/30/13 0400  NA 130* 133*  K 4.3 4.6  CL 94* 93*  CO2 24 26  GLUCOSE 80 87  BUN 11 6  CREATININE 0.69 0.63    Recent Labs  10/28/13 2335 10/29/13 0610  TROPONINI 0.58* 0.36*   No results found for this basename: INR,  in the last 72 hours  Imaging: Imaging results have been reviewed  . aspirin EC  81 mg Oral Daily  . atorvastatin  40 mg Oral q1800  . azithromycin  500 mg Intravenous Q24H  . calcium-vitamin D  1 tablet Oral BID WC  . cefTRIAXone (ROCEPHIN)  IV  1 g Intravenous Q24H  . clopidogrel  75 mg Oral Q breakfast  . enoxaparin (LOVENOX) injection  40 mg Subcutaneous Q24H  . fluticasone  2 spray Each Nare Daily  . isosorbide mononitrate  30 mg Oral Daily  . levothyroxine  100 mcg Oral QAC breakfast  . losartan  25 mg Oral Daily  . methylPREDNISolone acetate  40 mg Intramuscular Once  . metoprolol succinate  75 mg Oral TID  . multivitamin with minerals   1 tablet Oral Daily  . multivitamin-lutein  1 capsule Oral BID  . omega-3 acid ethyl esters  1 g Oral Daily  . sodium chloride  3 mL Intravenous Q12H    Cardiac Studies:  Assessment/Plan:   Active Problems:   Community acquired pneumonia   Dyspnea   Supraventricular tachycardia   CAD- severe LAD and RCA disease with collaterals from CFX Sept 2009. Medical Rx   Rheumatoid arthritis(714.0)   Elevated troponin- 0.58 in setting of PSVT   HYPERLIPIDEMIA   CAROTID STENOSIS- moderate   Personal history of pulmonary embolism   Hyponatremia   Leukopenia   Hypothyroidism   TIA (transient ischemic attack)- remote    PLAN: Will review with MD. She says she had a TIA 10 yrs ago. She has a history of palpitations although there can be "months between episodes".  Her beta blocker has been increased. ? Does she need long term anticoagulation, ? EP eval. Check TSH.   Kerin Ransom PA-C Beeper [phone] 10/30/2013, 8:32 AM   I have personally seen and examined this patient with Kerin Ransom, PA-C. I agree with the assessment and plan as outlined above. She is having runs of SVT. She is symptomatic with these episodes. She is  known to have severe CAD with chronic occlusion of her RCA, mid LAD and distal Circumflex. Last cath in 2009 with good collaterals. She is having no angina at home. I think her elevated troponin is likely secondary to demand ischemia with rapid rates. Agree with increase in beta blocker. Her SVT is likely exacerbated by her underlying acute pneumonia. I think this will likely subside after her pneumonia resolves. She is very concerned about this and notes multiple prior episodes at home. Will ask EP to see her today to discuss other options for therapy. If this is purely SVT, she would not need long term anti-coagulation. Will ask EP to comment on this  Johann Gascoigne 10:15 AM 10/30/2013

## 2013-10-31 LAB — BASIC METABOLIC PANEL
BUN: 8 mg/dL (ref 6–23)
CO2: 27 mEq/L (ref 19–32)
Calcium: 7.7 mg/dL — ABNORMAL LOW (ref 8.4–10.5)
Chloride: 96 mEq/L (ref 96–112)
Creatinine, Ser: 0.59 mg/dL (ref 0.50–1.10)
GFR calc Af Amer: >90 mL/min (ref >90)
GFR, EST NON AFRICAN AMERICAN: 87 mL/min — AB (ref >90)
Glucose, Bld: 93 mg/dL (ref 70–99)
POTASSIUM: 4.5 meq/L (ref 3.7–5.3)
Sodium: 135 mEq/L — ABNORMAL LOW (ref 137–147)

## 2013-10-31 LAB — CBC
HCT: 37.7 % (ref 36.0–46.0)
HEMOGLOBIN: 13.2 g/dL (ref 12.0–15.0)
MCH: 32 pg (ref 26.0–34.0)
MCHC: 35 g/dL (ref 30.0–36.0)
MCV: 91.3 fL (ref 78.0–100.0)
Platelets: 166 10*3/uL (ref 150–400)
RBC: 4.13 MIL/uL (ref 3.87–5.11)
RDW: 12.9 % (ref 11.5–15.5)
WBC: 2.3 10*3/uL — AB (ref 4.0–10.5)

## 2013-10-31 LAB — URINE CULTURE: Colony Count: 45000

## 2013-10-31 LAB — TSH: TSH: 0.031 u[IU]/mL — ABNORMAL LOW (ref 0.350–4.500)

## 2013-10-31 MED ORDER — AZITHROMYCIN 250 MG PO TABS
250.0000 mg | ORAL_TABLET | Freq: Every day | ORAL | Status: AC
  Administered 2013-10-31 – 2013-11-01 (×2): 250 mg via ORAL
  Filled 2013-10-31 (×2): qty 1

## 2013-10-31 NOTE — Progress Notes (Signed)
Subjective: Feels somewhat better. Less nausea. Less diarrhea. No sense of increased HR. Now off cardiazem per cards   Objective: Vital signs in last 24 hours: Temp:  [97.9 F (36.6 C)-98.4 F (36.9 C)] 97.9 F (36.6 C) (01/27 0419) Pulse Rate:  [70-73] 70 (01/27 0419) Resp:  [18] 18 (01/27 0419) BP: (114-174)/(66-85) 174/75 mmHg (01/27 0419) SpO2:  [95 %-98 %] 95 % (01/27 0419) Weight:  [65.137 kg (143 lb 9.6 oz)] 65.137 kg (143 lb 9.6 oz) (01/27 0419)  Intake/Output from previous day: 01/26 0701 - 01/27 0700 In: 1503 [P.O.:600; I.V.:903] Out: 1050 [Urine:1050] Intake/Output this shift:    Sitting up in no distress. Face symmetric, lungs more clear. No wheeze. Ht regular without skips . abd soft NT. No edema. Awake. Alert, mentating well  Lab Results   Recent Labs  10/30/13 0400 10/31/13 0542  WBC 3.6* 2.3*  RBC 4.24 4.13  HGB 13.6 13.2  HCT 39.0 37.7  MCV 92.0 91.3  MCH 32.1 32.0  RDW 13.3 12.9  PLT 162 166    Recent Labs  10/30/13 0400 10/31/13 0542  NA 133* 135*  K 4.6 4.5  CL 93* 96  CO2 26 27  GLUCOSE 87 93  BUN 6 8  CREATININE 0.63 0.59  CALCIUM 8.2* 7.7*    Studies/Results: X-ray Chest Pa And Lateral   10/29/2013   CLINICAL DATA:  Cough  EXAM: CHEST  2 VIEW  COMPARISON:  10/28/2013  FINDINGS: Cardiomegaly. Lungs hyper aerated and clear. No pneumothorax. No pleural effusion  IMPRESSION: Cardiomegaly without decompensation.   Electronically Signed   By: Maryclare Bean M.D.   On: 10/29/2013 15:10    Scheduled Meds: . aspirin EC  81 mg Oral Daily  . atorvastatin  40 mg Oral q1800  . azithromycin  500 mg Intravenous Q24H  . calcium-vitamin D  1 tablet Oral BID WC  . cefTRIAXone (ROCEPHIN)  IV  1 g Intravenous Q24H  . clopidogrel  75 mg Oral Q breakfast  . enoxaparin (LOVENOX) injection  40 mg Subcutaneous Q24H  . fluticasone  2 spray Each Nare Daily  . isosorbide mononitrate  30 mg Oral Daily  . levothyroxine  100 mcg Oral QAC breakfast  . losartan   25 mg Oral Daily  . metoprolol succinate  75 mg Oral TID  . multivitamin with minerals  1 tablet Oral Daily  . multivitamin-lutein  1 capsule Oral BID  . omega-3 acid ethyl esters  1 g Oral Daily  . sodium chloride  3 mL Intravenous Q12H   Continuous Infusions: . sodium chloride 75 mL/hr at 10/30/13 2316   PRN Meds:acetaminophen, acetaminophen, alum & mag hydroxide-simeth, guaiFENesin-dextromethorphan, levalbuterol, nitroGLYCERIN, ondansetron (ZOFRAN) IV, ondansetron  Assessment/Plan:  1. Dyspnea secondary to probable Community acquired pneumonia/pneumonitis-doing OK. Improving 2. Supraventricular tachycardia- getting better. Now off cardiazem  3. CAD with elevated Troponin-likely supply-demand ischemia in setting of pulmonary process and SVT. Continue medical therapy. Further management per Cardiology.  4. Personal history of pulmonary embolism- no evidence of PE on CT with contrast.  5. Hyponatremia- getting better, now 133  6. Leukopenia- sl worse  7. Disposition-anticipate discharge to home in AM if HR stays OK    LOS: 3 days   Chang Tiggs ALAN 10/31/2013, 8:29 AM

## 2013-10-31 NOTE — Progress Notes (Signed)
Subjective:  Good night, slept well.  Objective:  Vital Signs in the last 24 hours: Temp:  [97.9 F (36.6 C)-98.4 F (36.9 C)] 97.9 F (36.6 C) (01/27 0419) Pulse Rate:  [70-73] 70 (01/27 0419) Resp:  [18] 18 (01/27 0419) BP: (114-174)/(66-85) 174/75 mmHg (01/27 0419) SpO2:  [95 %-98 %] 95 % (01/27 0419) Weight:  [143 lb 9.6 oz (65.137 kg)] 143 lb 9.6 oz (65.137 kg) (01/27 0419)  Intake/Output from previous day:  Intake/Output Summary (Last 24 hours) at 10/31/13 0759 Last data filed at 10/31/13 0700  Gross per 24 hour  Intake   1503 ml  Output   1050 ml  Net    453 ml    Physical Exam: General appearance: alert, cooperative and no distress Lungs: clear to auscultation bilaterally Heart: regular rate and rhythm   Rate: 70  Rhythm: normal sinus rhythm and no PSVT overnight  Lab Results:  Recent Labs  10/30/13 0400 10/31/13 0542  WBC 3.6* 2.3*  HGB 13.6 13.2  PLT 162 166    Recent Labs  10/30/13 0400 10/31/13 0542  NA 133* 135*  K 4.6 4.5  CL 93* 96  CO2 26 27  GLUCOSE 87 93  BUN 6 8  CREATININE 0.63 0.59    Recent Labs  10/28/13 2335 10/29/13 0610  TROPONINI 0.58* 0.36*   No results found for this basename: INR,  in the last 72 hours  Imaging: Imaging results have been reviewed  Cardiac Studies:  Assessment/Plan:   Principal Problem:   PSVT (paroxysmal supraventricular tachycardia) Active Problems:   Community acquired pneumonia   Dyspnea   CAD- severe LAD and RCA disease with collaterals from CFX Sept 2009. Medical Rx   Rheumatoid arthritis(714.0)   Elevated troponin- 0.58 in setting of PSVT   HYPERLIPIDEMIA   CAROTID STENOSIS- moderate   Personal history of pulmonary embolism- postop in the 80's   Hyponatremia   Leukopenia   Hypothyroidism   TIA (transient ischemic attack)- remote    PLAN: Symptomatic PSVT yesterday around 12:30 pm, broke spontaneously, no further episodes. Her Toprol has been increased from 50 mg TID to 75 mg  TID. Her CAP appears to be resolving. Consider ablation if recurrent symptomatic PSVT. Follow up with Dr Percival Spanish after discharge.   Kerin Ransom PA-C Beeper [phone] 10/31/2013, 7:59 AM  I have personally seen and examined this patient with Kerin Ransom, PA-C. I agree with the assessment and plan as outlined above. She has SVT. Dr. Lovena Le has evaluated her and the plan from an EP standpoint is to continue metoprolol at increased dose. If she has frequent recurrence of SVT following resolution of pneumonia, would need to consider ablation. In regards to her CAD, continue medical management.   Lothar Prehn 9:54 AM 10/31/2013

## 2013-11-01 ENCOUNTER — Other Ambulatory Visit: Payer: Self-pay | Admitting: Physician Assistant

## 2013-11-01 DIAGNOSIS — I471 Supraventricular tachycardia: Secondary | ICD-10-CM

## 2013-11-01 LAB — CBC
HCT: 43.5 % (ref 36.0–46.0)
HEMOGLOBIN: 15.4 g/dL — AB (ref 12.0–15.0)
MCH: 32.4 pg (ref 26.0–34.0)
MCHC: 35.4 g/dL (ref 30.0–36.0)
MCV: 91.4 fL (ref 78.0–100.0)
PLATELETS: 179 10*3/uL (ref 150–400)
RBC: 4.76 MIL/uL (ref 3.87–5.11)
RDW: 12.9 % (ref 11.5–15.5)
WBC: 5.5 10*3/uL (ref 4.0–10.5)

## 2013-11-01 LAB — BASIC METABOLIC PANEL
BUN: 8 mg/dL (ref 6–23)
CALCIUM: 8.1 mg/dL — AB (ref 8.4–10.5)
CO2: 25 mEq/L (ref 19–32)
Chloride: 96 mEq/L (ref 96–112)
Creatinine, Ser: 0.61 mg/dL (ref 0.50–1.10)
GFR calc Af Amer: >90 mL/min (ref >90)
GFR calc non Af Amer: 87 mL/min — ABNORMAL LOW (ref >90)
GLUCOSE: 92 mg/dL (ref 70–99)
Potassium: 4.6 mEq/L (ref 3.7–5.3)
Sodium: 137 mEq/L (ref 137–147)

## 2013-11-01 MED ORDER — LEVOTHYROXINE SODIUM 88 MCG PO TABS: 88.0000 ug | ORAL_TABLET | Freq: Every day | ORAL | Status: DC

## 2013-11-01 MED ORDER — LOSARTAN POTASSIUM 25 MG PO TABS
25.0000 mg | ORAL_TABLET | Freq: Every day | ORAL | Status: DC
  Administered 2013-11-01: 25 mg via ORAL
  Filled 2013-11-01: qty 1

## 2013-11-01 MED ORDER — LEVOFLOXACIN 500 MG PO TABS
500.0000 mg | ORAL_TABLET | Freq: Every day | ORAL | Status: DC
  Filled 2013-11-01: qty 1

## 2013-11-01 MED ORDER — METOPROLOL SUCCINATE ER 25 MG PO TB24: 75.0000 mg | ORAL_TABLET | Freq: Three times a day (TID) | ORAL | Status: DC

## 2013-11-01 MED ORDER — LEVOTHYROXINE SODIUM 88 MCG PO TABS
88.0000 ug | ORAL_TABLET | Freq: Every day | ORAL | Status: DC
  Filled 2013-11-01: qty 1

## 2013-11-01 MED ORDER — LEVOFLOXACIN 500 MG PO TABS: 500.0000 mg | ORAL_TABLET | Freq: Every day | ORAL | Status: DC

## 2013-11-01 MED ORDER — LOSARTAN POTASSIUM 50 MG PO TABS: 88.0000 mg | ORAL_TABLET | Freq: Every day | ORAL | Status: DC

## 2013-11-01 NOTE — Progress Notes (Signed)
Patient complaints of Indigestion/gas. Refused [place]. Patient thinks it may resolve with more movement.

## 2013-11-01 NOTE — Progress Notes (Signed)
Subjective: No dizziness this morning.  Sme CP from gas  Objective: Vital signs in last 24 hours: Temp:  [97.6 F (36.4 C)-98.3 F (36.8 C)] 97.9 F (36.6 C) (01/28 0513) Pulse Rate:  [65-72] 71 (01/28 0513) Resp:  [16-18] 18 (01/28 0513) BP: (177-189)/(71-83) 178/83 mmHg (01/28 0513) SpO2:  [94 %-97 %] 97 % (01/28 0513) Weight:  [140 lb 12.8 oz (63.866 kg)] 140 lb 12.8 oz (63.866 kg) (01/28 0513) Last BM Date: 10/31/13  Intake/Output from previous day: 01/27 0701 - 01/28 0700 In: 2450 [P.O.:600; I.V.:1800; IV Piggyback:50] Out: 2100 [Urine:2100] Intake/Output this shift: Total I/O In: 120 [P.O.:120] Out: 400 [Urine:400]  Medications Current Facility-Administered Medications  Medication Dose Route Frequency Provider Last Rate Last Dose  . 0.9 %  sodium chloride infusion   Intravenous Continuous Janalyn Rouse, MD 75 mL/hr at 10/31/13 1054 1 mL at 10/31/13 1054  . acetaminophen (TYLENOL) tablet 650 mg  650 mg Oral Q6H PRN Janalyn Rouse, MD       Or  . acetaminophen (TYLENOL) suppository 650 mg  650 mg Rectal Q6H PRN Janalyn Rouse, MD      . alum & mag hydroxide-simeth (MAALOX/MYLANTA) 200-200-20 MG/5ML suspension 30 mL  30 mL Oral Q6H PRN Janalyn Rouse, MD      . aspirin EC tablet 81 mg  81 mg Oral Daily Sheela Stack, MD   81 mg at 10/31/13 1051  . atorvastatin (LIPITOR) tablet 40 mg  40 mg Oral q1800 Janalyn Rouse, MD   40 mg at 10/31/13 1738  . azithromycin (ZITHROMAX) tablet 250 mg  250 mg Oral Daily Sheela Stack, MD   250 mg at 10/31/13 1052  . calcium-vitamin D (OSCAL WITH D) 500-200 MG-UNIT per tablet 1 tablet  1 tablet Oral BID WC Sheela Stack, MD   1 tablet at 10/31/13 1605  . clopidogrel (PLAVIX) tablet 75 mg  75 mg Oral Q breakfast W Lutricia Feil, MD   75 mg at 10/31/13 1051  . enoxaparin (LOVENOX) injection 40 mg  40 mg Subcutaneous Q24H Janalyn Rouse, MD   40 mg at 10/31/13 2219  . fluticasone (FLONASE) 50 MCG/ACT nasal spray 2 spray  2  spray Each Nare Daily W Lutricia Feil, MD      . guaiFENesin-dextromethorphan [facility] DM) 100-10 MG/5ML syrup 5 mL  5 mL Oral Q4H PRN Janalyn Rouse, MD      . isosorbide mononitrate (IMDUR) 24 hr tablet 30 mg  30 mg Oral Daily W Lutricia Feil, MD   30 mg at 10/31/13 1051  . levalbuterol (XOPENEX) nebulizer solution 0.63 mg  0.63 mg Nebulization Q4H PRN Sheela Stack, MD      . levofloxacin [facility]) tablet 500 mg  500 mg Oral Daily Sheela Stack, MD      . Derrill Memo ON 11/02/2013] levothyroxine (SYNTHROID, LEVOTHROID) tablet 88 mcg  88 mcg Oral QAC breakfast Sheela Stack, MD      . losartan (COZAAR) tablet 25 mg  25 mg Oral Daily Sheela Stack, MD      . metoprolol succinate (TOPROL-XL) 24 hr tablet 75 mg  75 mg Oral TID Javier Docker, MD   75 mg at 10/31/13 2219  . multivitamin with minerals tablet 1 tablet  1 tablet Oral Daily Sheela Stack, MD   1 tablet at 10/31/13 1050  . multivitamin-lutein (OCUVITE-LUTEIN) capsule 1 capsule  1 capsule Oral BID Sheela Stack, MD  1 capsule at 10/31/13 2219  . nitroGLYCERIN (NITROSTAT) SL tablet 0.4 mg  0.4 mg Sublingual Q5 min PRN Janalyn Rouse, MD      . omega-3 acid ethyl esters (LOVAZA) capsule 1 g  1 g Oral Daily Sheela Stack, MD   1 g at 10/29/13 0947  . ondansetron (ZOFRAN) tablet 4 mg  4 mg Oral Q6H PRN Janalyn Rouse, MD       Or  . ondansetron [facility]) injection 4 mg  4 mg Intravenous Q6H PRN Janalyn Rouse, MD   4 mg at 10/30/13 1639  . sodium chloride 0.9 % injection 3 mL  3 mL Intravenous Q12H W Lutricia Feil, MD   3 mL at 10/31/13 2219    PE: General appearance: alert, cooperative and no distress Lungs: clear to auscultation bilaterally Heart: regular rate and rhythm, S1, S2 normal, no murmur, click, rub or gallop Extremities: No LEE Pulses: 2+ and symmetric Skin: Warm and dry Neurologic: Grossly normal  Lab Results:   Recent Labs  10/30/13 0400 10/31/13 0542 11/01/13 0534  WBC 3.6* 2.3* 5.5   HGB 13.6 13.2 15.4*  HCT 39.0 37.7 43.5  PLT 162 166 179   BMET  Recent Labs  10/30/13 0400 10/31/13 0542 11/01/13 0534  NA 133* 135* 137  K 4.6 4.5 4.6  CL 93* 96 96  CO2 26 27 25   GLUCOSE 87 93 92  BUN 6 8 8   CREATININE 0.63 0.59 0.61  CALCIUM 8.2* 7.7* 8.1*    Assessment/Plan  Principal Problem:   PSVT (paroxysmal supraventricular tachycardia) Active Problems:   HYPERLIPIDEMIA   CAD- severe LAD and RCA disease with collaterals from CFX Sept 2009. Medical Rx   CAROTID STENOSIS- moderate   Rheumatoid arthritis(714.0)   Community acquired pneumonia   Dyspnea   Personal history of pulmonary embolism- postop in the 80's   Hyponatremia   Leukopenia   Elevated troponin- 0.58 in setting of PSVT   Hypothyroidism   TIA (transient ischemic attack)- remote  Plan:  She continues to have PSVT.  Episode ~0630hrs this morning for >44mins.  Only short bursts for the 18 or so hours prior.  On Toprol xl 75mg  TID.  BP will tolerated increased BB.   Seen by EP.  Will consider ablation.  Needs HR monitor after DC to identify extent of PSVT.       LOS: 4 days    HAGER, BRYAN 11/01/2013 9:41 AM  I have personally seen and examined this patient with Tarri Fuller, PA-C. I agree with the assessment and plan as outlined above. OK to d/c home today. Continue beta blocker. She will need f/u with Dr. Percival Spanish in 1-2 weeks. She will be called by office to come by and pick up a 30 day event monitor.   Devora Tortorella\ 11/01/2013 11:02 AM

## 2013-11-01 NOTE — Progress Notes (Signed)
Pt discharged for home via transport by husband.  All discharge instructions including meds and where to pick up meds reviewed with patient and spouse.  No voiced complaints.  Stated understanding of all.

## 2013-11-01 NOTE — Care Management Note (Addendum)
  Page 1 of 1   11/01/2013     2:02:54 PM   CARE MANAGEMENT NOTE 11/01/2013  Patient:  [name], [name]   Account Number:  [id number]  Date Initiated:  11/01/2013  Documentation initiated by:  Nikita Surman  Subjective/Objective Assessment:   Admitted with CAPneumonia     Action/Plan:   will follow for disposition needs   Anticipated DC Date:  11/01/2013   Anticipated DC Plan:  HOME/SELF CARE         Choice offered to / List presented to:             Status of service:  Completed, signed off Medicare Important Message given?   (If response is "NO", the following Medicare IM given date fields will be blank) Date Medicare IM given:   Date Additional Medicare IM given:    Discharge Disposition:  HOME/SELF CARE  Per UR Regulation:  Reviewed for med. necessity/level of care/duration of stay  If discussed at [place] of Stay Meetings, dates discussed:    Comments:

## 2013-11-01 NOTE — Discharge Summary (Signed)
DISCHARGE SUMMARY  [name]  MR#: [id number]  DOB:[birth year]  Date of Admission: 10/28/2013 Date of Discharge: 11/01/2013  Attending Physician:[name]  Patient's RE:[id number] [name], MD  Consults:Treatment Team:  Rounding Lbcardiology, MD  Discharge Diagnoses: Principal Problem:   PSVT (paroxysmal supraventricular tachycardia) Active Problems:   HYPERLIPIDEMIA   CAD- severe LAD and RCA disease with collaterals from CFX Sept 2009. Medical Rx   CAROTID STENOSIS- moderate   Rheumatoid arthritis(714.0)   Community acquired pneumonia, improved   Dyspnea, improving   Personal history of pulmonary embolism- postop in the 80's   Hyponatremia, resolved   Leukopenia, improved   Elevated troponin- 0.58 in setting of PSVT   Hypothyroidism, with overreplacement, dose adjusted   TIA (transient ischemic attack)- remote Proteus UTI   Discharge Medications:   Medication List                     TAKE these medications       aspirin 81 MG tablet  Take 81 mg by mouth daily.     atorvastatin 40 MG tablet  Commonly known as:  LIPITOR  Take 40 mg by mouth daily.     CALCIUM + D PO  Take by mouth daily.     clopidogrel 75 MG tablet  Commonly known as:  PLAVIX  Take 75 mg by mouth daily.     EYE DROPS A/C OP  Apply to eye.     fish oil-omega-3 fatty acids 1000 MG capsule  Take 1 g by mouth daily.     fluticasone 50 MCG/ACT nasal spray  Commonly known as:  FLONASE  Place 2 sprays into the nose daily.     isosorbide mononitrate 30 MG 24 hr tablet  Commonly known as:  IMDUR  Take 30 mg by mouth daily.     levofloxacin 500 MG tablet  Commonly known as:  LEVAQUIN  Take 1 tablet (500 mg total) by mouth daily.     levothyroxine 88 MCG tablet  Commonly known as:  SYNTHROID  Take 1 tablet (88 mcg total) by mouth daily.     metoprolol succinate 25 MG 24 hr tablet  Commonly known as:  TOPROL-XL  Take 3 tablets (75 mg total) by mouth 3 (three)  times daily. Take with or immediately following a meal.     multivitamin per tablet  Take 1 tablet by mouth daily.     nitroGLYCERIN 0.4 MG SL tablet  Commonly known as:  NITROSTAT  Place 1 tablet (0.4 mg total) under the tongue every 5 (five) minutes as needed.     OCUVITE PRESERVISION PO  Take by mouth 2 (two) times daily.     TYLENOL PO  Take 500 mg by mouth every 6 (six) hours as needed (pain).       losartan 50 mg one daily  Hospital Procedures: X-ray Chest Pa And Lateral   10/29/2013   CLINICAL DATA:  Cough  EXAM: CHEST  2 VIEW  COMPARISON:  10/28/2013  FINDINGS: Cardiomegaly. Lungs hyper aerated and clear. No pneumothorax. No pleural effusion  IMPRESSION: Cardiomegaly without decompensation.   Electronically Signed   By: Maryclare Bean M.D.   On: 10/29/2013 15:10   Dg Chest 2 View  10/28/2013   CLINICAL DATA:  Cough.  Fever.  Coronary artery disease.  EXAM: CHEST  2 VIEW  COMPARISON:  08/04/2009  FINDINGS: Moderate cardiomegaly is stable. No evidence of congestive heart failure. Changes of COPD again noted.  Two ill-defined nodular opacities  are seen in the right upper and mid lung fields on the frontal projection. These were not seen on previous study. Neoplasm cannot be excluded.  IMPRESSION: New right upper and mid lung field nodular opacities. Neoplasm cannot be excluded. Chest CT without contrast recommended for further evaluation.  Stable cardiomegaly and COPD.   Electronically Signed   By: Earle Gell M.D.   On: 10/28/2013 11:20   Ct Chest W Contrast  10/28/2013   CLINICAL DATA:  Productive cough and low-grade fever.  EXAM: CT CHEST WITH CONTRAST  TECHNIQUE: Multidetector CT imaging of the chest was performed during intravenous contrast administration.  CONTRAST:  49mL OMNIPAQUE IOHEXOL 300 MG/ML  SOLN  COMPARISON:  Chest x-ray 10/28/2013.  Chest CT 11/14/2010.  FINDINGS: Atherosclerotic vascular disease noted in thoracic and upper abdominal aorta. No aneurysm. No dissection. Great  vessels are unremarkable. Direct origin of the left vertebral artery is noted. This is a normal variant. Pulmonary arteries are normal. No pulmonary embolus. Coronary artery disease present. Stable cardiomegaly.  Tiny shotty subcentimeter nonspecific mediastinal lymph nodes are present. The thoracic esophagus is normal. Adrenals are normal. Prior cholecystectomy. Stable intrahepatic and extrahepatic biliary distention, this is unchanged from prior CT of 11/14/2010. This is consistent with prior cholecystectomy. Correlation with liver/biliary function tests. Small stable hiatal hernia.  Large airways are patent. Tiny ill-defined 7 mm density in the right middle lobe is unchanged consistent with scar. Previously identified subtle patchy infiltrates in right upper lobe are not identified. Slight sclerosis is noted overlying the right second fourth ribs. This is stable from prior CT of 11/14/2010. The findings are most consistent with healed rib fractures. Minimal infiltrates suggesting pneumonitis noted in the left perihilar region, image number 27/series 5. Stable bibasilar and apical pleural parenchymal scarring.  Thyroid is normal. Supraclavicular axillary regions are normal. Asymmetric breast parenchymal densities with coarse calcifications again noted and unchanged. Correlation with mammography. Diffuse degenerative changes thoracic spine. Stable upper lumbar mild compression fracture, no change from 2012.  IMPRESSION: 1. Previously identified questionable ill-defined small infiltrates in the right upper lobe on chest x-ray 10/28/2013 represent overlying healed rib fractures. 2. Minimal left perihilar pneumonitis. 3. Coronary artery disease. 4. Cholecystectomy. Stable biliary distention, most likely secondary to post cholecystectomy state.   Electronically Signed   By: Marcello Moores  Register   On: 10/28/2013 12:51    History of Present Illness: Fever and cough  Hospital Course: This is a 78 year old white female  with known cardiac disease who presented with a complex of issues. She had both a community acquired pneumonia as well as paroxysmal supraventricular tachycardia. She was ill with bronchospasm and required supplemental oxygen and broad-spectrum antibiotics. Or truly a pulmonary process has gradually improved. She had runs of PSVT during this hospitalization with some chest pain. Cardiology is been following her along daily. She was seen briefly by the electrophysiology team as well. It is felt that the adjustment in her beta blocker is most appropriate course of action in the setting of acute illness. Additionally, today her TSH has returned and shows some over replacement. This may be worsening the situation. Interestingly her white blood cell count has trended on the low side this hospitalization. But has now returned to normal level   Her electrolytes are done well. . She has some loose stools but are improved. She has some residual cough and bronchospasm but this is improving. She had no chills or sweats overnight. She's had no further chest pain. Her appetite is fair. Blood pressures  trended upward. Her losartan will be restarted. Although she is not entirely back to baseline, I believe she can continue with outpatient antibiotics. The Levaquin should handle the urinary tract infection although it has probably been fully treated at this time already. Her last dose of azithromycin is at 10:00 today. Probiotics are advised at home  Day of Discharge Exam BP 178/83  Pulse 71  Temp(Src) 97.9 F (36.6 C) (Oral)  Resp 18  Ht 5\' 2"  (1.575 m)  Wt 63.866 kg (140 lb 12.8 oz)  BMI 25.75 kg/m2  SpO2 97%  Physical Exam: General appearance: sitting up in no distress.  Eyes: no scleral icterus, no nystagmus is present  Throat: oropharynx moist without erythema, oral mucous membranes are moist  Resp: scattered rhonchi with no wheeze no accessory muscles in use  Cardio: Regular with systolic murmur no skips or  extrasystoles GI: soft, non-tender; bowel sounds normal; no masses,  no organomegaly Extremities: no clubbing, cyanosis or edema, pulses strong Neuro: Awake alert and mentating well speech is clear no tremor is present  Discharge Labs:  Recent Labs  10/31/13 0542 11/01/13 0534  NA 135* 137  K 4.5 4.6  CL 96 96  CO2 27 25  GLUCOSE 93 92  BUN 8 8  CREATININE 0.59 0.61  CALCIUM 7.7* 8.1*  Results for KAWANDA, DRUMHELLER (MRN [id number]) as of 11/01/2013 08:34  Ref. Range 10/28/2013 23:35 10/29/2013 06:10 10/30/2013 04:00 10/31/2013 05:42 11/01/2013 05:34  Troponin I Latest Range: <0.30 ng/mL 0.58 (HH) 0.36 (HH)      28-Oct-2013 13:26:56 [facility]/ED ROUTINE RECORD Supraventricular tachycardia Repolarization abnormality, prob rate related  Recent Labs  10/31/13 0542 11/01/13 0534  WBC 2.3* 5.5  HGB 13.2 15.4*  HCT 37.7 43.5  MCV 91.3 91.4  PLT 166 179   Value: PROTEUS MIRABILIS           Results   Urine culture (Order [id number])       Entry Date    10/31/2013     Component Results    Component    Specimen Description    URINE, RANDOM    Special Requests    NONE    Culture  Setup Time    10/28/2013 19:49 Performed at [place]    45,000 COLONIES/ML Performed at [place] Performed at [facility]    Report Status    10/31/2013 FINAL    Organism ID, Bacteria    PROTEUS MIRABILIS     Culture & Susceptibility    PROTEUS MIRABILIS    Antibiotic Sensitivity Microscan Status    AMPICILLIN Sensitive <=2 SENSITIVE Final    Method: MIC    CEFAZOLIN Sensitive 8 SENSITIVE Final    Method: MIC    CEFTRIAXONE Sensitive <=1 SENSITIVE Final    Method: MIC    CIPROFLOXACIN Sensitive <=0.25 SENSITIVE Final    Method: MIC    GENTAMICIN Sensitive <=1 SENSITIVE Final    Method: MIC    LEVOFLOXACIN Sensitive <=0.12 SENSITIVE Final    Method: MIC    NITROFURANTOIN Resistant  256 RESISTANT Final    Method: MIC    PIP/TAZO Sensitive <=4 SENSITIVE Final    Method: MIC    TOBRAMYCIN Sensitive <=1 SENSITIVE Final    Method: MIC    TRIMETH/SULFA Sensitive <=20 SENSITIVE Final    Method: MIC    Comments PROTEUS MIRABILIS (MIC)    PROTEUS  MIRABILIS         Recent Labs  10/31/13 0931  TSH 0.031*     Discharge instructions:   Disposition: To home  Follow-up Appts: Follow-up with Dr. home and at [facility] in 2 weeks.  Call for appointment.  Condition on Discharge: Improved  Tests Needing Follow-up: None  Signed: Sydni Elizarraraz ALAN 11/01/2013, 8:27 AM

## 2013-11-05 ENCOUNTER — Emergency Department (HOSPITAL_COMMUNITY)
Admission: EM | Admit: 2013-11-05 | Discharge: 2013-11-05 | Disposition: A | Discharge status: Home / Self Care | Payer: Medicare Other | Attending: Emergency Medicine | Admitting: Emergency Medicine

## 2013-11-05 ENCOUNTER — Encounter (HOSPITAL_COMMUNITY): Payer: Self-pay | Admitting: Emergency Medicine

## 2013-11-05 ENCOUNTER — Emergency Department (HOSPITAL_COMMUNITY): Payer: Medicare Other

## 2013-11-05 DIAGNOSIS — E785 Hyperlipidemia, unspecified: Secondary | ICD-10-CM | POA: Insufficient documentation

## 2013-11-05 DIAGNOSIS — Z79899 Other long term (current) drug therapy: Secondary | ICD-10-CM | POA: Insufficient documentation

## 2013-11-05 DIAGNOSIS — I2510 Atherosclerotic heart disease of native coronary artery without angina pectoris: Secondary | ICD-10-CM | POA: Insufficient documentation

## 2013-11-05 DIAGNOSIS — Y92009 Unspecified place in unspecified non-institutional (private) residence as the place of occurrence of the external cause: Secondary | ICD-10-CM | POA: Insufficient documentation

## 2013-11-05 DIAGNOSIS — Y9301 Activity, walking, marching and hiking: Secondary | ICD-10-CM | POA: Insufficient documentation

## 2013-11-05 DIAGNOSIS — S0083XA Contusion of other part of head, initial encounter: Secondary | ICD-10-CM

## 2013-11-05 DIAGNOSIS — M069 Rheumatoid arthritis, unspecified: Secondary | ICD-10-CM | POA: Insufficient documentation

## 2013-11-05 DIAGNOSIS — R110 Nausea: Secondary | ICD-10-CM | POA: Insufficient documentation

## 2013-11-05 DIAGNOSIS — Z8719 Personal history of other diseases of the digestive system: Secondary | ICD-10-CM | POA: Insufficient documentation

## 2013-11-05 DIAGNOSIS — I498 Other specified cardiac arrhythmias: Secondary | ICD-10-CM | POA: Insufficient documentation

## 2013-11-05 DIAGNOSIS — S0100XA Unspecified open wound of scalp, initial encounter: Secondary | ICD-10-CM | POA: Insufficient documentation

## 2013-11-05 DIAGNOSIS — R5381 Other malaise: Secondary | ICD-10-CM | POA: Insufficient documentation

## 2013-11-05 DIAGNOSIS — S1093XA Contusion of unspecified part of neck, initial encounter: Secondary | ICD-10-CM

## 2013-11-05 DIAGNOSIS — IMO0002 Reserved for concepts with insufficient information to code with codable children: Secondary | ICD-10-CM | POA: Insufficient documentation

## 2013-11-05 DIAGNOSIS — Z85850 Personal history of malignant neoplasm of thyroid: Secondary | ICD-10-CM | POA: Insufficient documentation

## 2013-11-05 DIAGNOSIS — Z86718 Personal history of other venous thrombosis and embolism: Secondary | ICD-10-CM | POA: Insufficient documentation

## 2013-11-05 DIAGNOSIS — W1809XA Striking against other object with subsequent fall, initial encounter: Secondary | ICD-10-CM | POA: Insufficient documentation

## 2013-11-05 DIAGNOSIS — R42 Dizziness and giddiness: Secondary | ICD-10-CM | POA: Insufficient documentation

## 2013-11-05 DIAGNOSIS — S0003XA Contusion of scalp, initial encounter: Secondary | ICD-10-CM | POA: Insufficient documentation

## 2013-11-05 DIAGNOSIS — R0602 Shortness of breath: Secondary | ICD-10-CM | POA: Insufficient documentation

## 2013-11-05 DIAGNOSIS — R002 Palpitations: Secondary | ICD-10-CM | POA: Insufficient documentation

## 2013-11-05 DIAGNOSIS — S0101XA Laceration without foreign body of scalp, initial encounter: Secondary | ICD-10-CM

## 2013-11-05 DIAGNOSIS — R55 Syncope and collapse: Secondary | ICD-10-CM | POA: Insufficient documentation

## 2013-11-05 DIAGNOSIS — R5383 Other fatigue: Secondary | ICD-10-CM

## 2013-11-05 DIAGNOSIS — Z7902 Long term (current) use of antithrombotics/antiplatelets: Secondary | ICD-10-CM | POA: Insufficient documentation

## 2013-11-05 DIAGNOSIS — R630 Anorexia: Secondary | ICD-10-CM | POA: Insufficient documentation

## 2013-11-05 DIAGNOSIS — Z8669 Personal history of other diseases of the nervous system and sense organs: Secondary | ICD-10-CM | POA: Insufficient documentation

## 2013-11-05 DIAGNOSIS — M810 Age-related osteoporosis without current pathological fracture: Secondary | ICD-10-CM | POA: Insufficient documentation

## 2013-11-05 DIAGNOSIS — Z23 Encounter for immunization: Secondary | ICD-10-CM | POA: Insufficient documentation

## 2013-11-05 DIAGNOSIS — Z7982 Long term (current) use of aspirin: Secondary | ICD-10-CM | POA: Insufficient documentation

## 2013-11-05 DIAGNOSIS — Z8701 Personal history of pneumonia (recurrent): Secondary | ICD-10-CM | POA: Insufficient documentation

## 2013-11-05 DIAGNOSIS — I10 Essential (primary) hypertension: Secondary | ICD-10-CM | POA: Insufficient documentation

## 2013-11-05 DIAGNOSIS — Z792 Long term (current) use of antibiotics: Secondary | ICD-10-CM | POA: Insufficient documentation

## 2013-11-05 LAB — CBC
HCT: 40.3 % (ref 36.0–46.0)
Hemoglobin: 14.5 g/dL (ref 12.0–15.0)
MCH: 32.7 pg (ref 26.0–34.0)
MCHC: 36 g/dL (ref 30.0–36.0)
MCV: 90.8 fL (ref 78.0–100.0)
PLATELETS: 230 10*3/uL (ref 150–400)
RBC: 4.44 MIL/uL (ref 3.87–5.11)
RDW: 12.9 % (ref 11.5–15.5)
WBC: 7.7 10*3/uL (ref 4.0–10.5)

## 2013-11-05 LAB — BASIC METABOLIC PANEL
BUN: 13 mg/dL (ref 6–23)
CO2: 24 meq/L (ref 19–32)
Calcium: 8.6 mg/dL (ref 8.4–10.5)
Chloride: 91 mEq/L — ABNORMAL LOW (ref 96–112)
Creatinine, Ser: 0.64 mg/dL (ref 0.50–1.10)
GFR calc Af Amer: >90 mL/min (ref >90)
GFR calc non Af Amer: 85 mL/min — ABNORMAL LOW (ref >90)
Glucose, Bld: 102 mg/dL — ABNORMAL HIGH (ref 70–99)
Potassium: 4.2 mEq/L (ref 3.7–5.3)
SODIUM: 131 meq/L — AB (ref 137–147)

## 2013-11-05 LAB — URINE MICROSCOPIC-ADD ON

## 2013-11-05 LAB — URINALYSIS, ROUTINE W REFLEX MICROSCOPIC
Bilirubin Urine: NEGATIVE
GLUCOSE, UA: NEGATIVE mg/dL
Ketones, ur: 15 mg/dL — AB
LEUKOCYTES UA: NEGATIVE
Nitrite: NEGATIVE
PH: 7 (ref 5.0–8.0)
PROTEIN: 30 mg/dL — AB
Specific Gravity, Urine: 1.011 (ref 1.005–1.030)
Urobilinogen, UA: 0.2 mg/dL (ref 0.0–1.0)

## 2013-11-05 LAB — GLUCOSE, CAPILLARY: Glucose-Capillary: 87 mg/dL (ref 70–99)

## 2013-11-05 LAB — TROPONIN I

## 2013-11-05 MED ORDER — ACETAMINOPHEN 325 MG PO TABS
650.0000 mg | ORAL_TABLET | Freq: Once | ORAL | Status: AC
  Administered 2013-11-05: 650 mg via ORAL
  Filled 2013-11-05: qty 2

## 2013-11-05 MED ORDER — SODIUM CHLORIDE 0.9 % IV BOLUS (SEPSIS)
500.0000 mL | Freq: Once | INTRAVENOUS | Status: AC
  Administered 2013-11-05: 500 mL via INTRAVENOUS

## 2013-11-05 MED ORDER — TETANUS-DIPHTH-ACELL PERTUSSIS 5-2.5-18.5 LF-MCG/0.5 IM SUSP
0.5000 mL | Freq: Once | INTRAMUSCULAR | Status: AC
  Administered 2013-11-05: 0.5 mL via INTRAMUSCULAR
  Filled 2013-11-05: qty 0.5

## 2013-11-05 MED ORDER — SODIUM CHLORIDE 0.9 % IV BOLUS (SEPSIS)
1000.0000 mL | Freq: Once | INTRAVENOUS | Status: AC
  Administered 2013-11-05: 1000 mL via INTRAVENOUS

## 2013-11-05 MED ORDER — ONDANSETRON HCL 4 MG PO TABS: 4.0000 mg | ORAL_TABLET | Freq: Four times a day (QID) | ORAL | Status: DC | PRN

## 2013-11-05 NOTE — ED Notes (Signed)
Patient in CT at this time.

## 2013-11-05 NOTE — ED Provider Notes (Signed)
CSN: [id number]     Arrival date & time 11/05/13  1101 History   First MD Initiated Contact with Patient 11/05/13 1104     Chief Complaint  Patient presents with  . Loss of Consciousness   (Consider location/radiation/quality/duration/timing/severity/associated sxs/prior Treatment) HPI Comments: 78 year old female presents after syncope. The patient was discharged from the hospital 4 days ago for pneumonia and intermittent SVT. The patient states that she had an episode of SVT this morning prior to syncope. She states she's been intermittently nauseous at home as well as while she is in the hospital. She had episode of nausea and try to go get her alert button that she felt dizzy and passed out. She's not remember the fall or hitting her head. She has a hematoma to the right side of her parietal scalp. Her husband came home to find her alert but on the floor with her head injury. The patient denies any chest pain. She states she did not feel palpitations during this episode. She states she's been feeling diffusely weak today. She has not had any dysuria. She states her shortness of breath is improved since discharge. She has pain in her head where she hit her head. She denies any neck pain. No focal weakness or numbness.   Past Medical History  Diagnosis Date  . CAD, UNSPECIFIED SITE     2009. 99% proximal RCA stenosis followed by 80% distal stenosis. The LAD has a 100% stenosis in the midsegment. The circumflex is patent with distal occlusion. She has been managed medically. Her EF was well-preserved.  Marland Kitchen CAROTID STENOSIS   . DIVERTICULAR DISEASE   . Essential hypertension, benign   . HYPERLIPIDEMIA   . MACULAR DEGENERATION   . OSTEOPOROSIS   . Rheumatoid arthritis(714.0)   . Hx of degenerative disc disease   . Gastritis   . Thyroid cancer 2008  . H/O blood clots 1984    in groin area that travels to lungs  . Gallstones 2009   Past Surgical History  Procedure Laterality Date  .  Appendectomy  1959  . Tonsillectomy    . Abdominal hysterectomy  1984  . Im nailing femoral shaft retrograde  2010    left  . Wrist surgery  2004    Left  . Esophagogastroduodenoscopy    . Colonoscopy    . Breast cyst excision      left  . Cholecystectomy  2009   Family History  Problem Relation Age of Onset  . Heart attack Mother   . Heart disease Father   . Diabetes Father   . Ovarian cancer Maternal Aunt   . Clotting disorder Mother   . Lung cancer Brother    History  Substance Use Topics  . Smoking status: Never Smoker   . Smokeless tobacco: Never Used  . Alcohol Use: No   OB History   Grav Para Term Preterm Abortions TAB SAB Ect Mult Living                 Review of Systems  Constitutional: Negative for fever and chills.  Respiratory: Negative for shortness of breath.   Cardiovascular: Positive for palpitations. Negative for chest pain.  Gastrointestinal: Positive for nausea. Negative for vomiting and abdominal pain.  Genitourinary: Negative for dysuria.  Skin: Positive for wound.  Neurological: Positive for dizziness, syncope, weakness and headaches.  All other systems reviewed and are negative.    Allergies  Hydrocodone and Prednisone  Home Medications   Current Outpatient Rx  Name  Route  Sig  Dispense  Refill  . Acetaminophen (TYLENOL PO)   Oral   Take 500 mg by mouth every 6 (six) hours as needed (pain).          Marland Kitchen aspirin 81 MG tablet   Oral   Take 81 mg by mouth daily.           Marland Kitchen atorvastatin (LIPITOR) 40 MG tablet   Oral   Take 40 mg by mouth daily.           . Calcium Carbonate-Vitamin D (CALCIUM + D PO)   Oral   Take by mouth daily.           . clopidogrel (PLAVIX) 75 MG tablet   Oral   Take 75 mg by mouth daily.           . fish oil-omega-3 fatty acids 1000 MG capsule   Oral   Take 1 g by mouth daily.          . fluticasone (FLONASE) 50 MCG/ACT nasal spray   Nasal   Place 2 sprays into the nose daily.             . isosorbide mononitrate (IMDUR) 30 MG 24 hr tablet   Oral   Take 30 mg by mouth daily.           Marland Kitchen levofloxacin (LEVAQUIN) 500 MG tablet   Oral   Take 1 tablet (500 mg total) by mouth daily.   7 tablet   0   . levothyroxine (SYNTHROID) 88 MCG tablet   Oral   Take 1 tablet (88 mcg total) by mouth daily.   30 tablet   6   . levothyroxine (SYNTHROID, LEVOTHROID) 88 MCG tablet   Oral   Take 1 tablet (88 mcg total) by mouth daily before breakfast.   30 tablet   6   . losartan (COZAAR) 25 MG tablet   Oral   Take 25 mg by mouth daily.           . metoprolol succinate (TOPROL-XL) 25 MG 24 hr tablet   Oral   Take 3 tablets (75 mg total) by mouth 3 (three) times daily. Take with or immediately following a meal.   90 tablet   1   . Multiple Vitamins-Minerals (OCUVITE PRESERVISION PO)   Oral   Take by mouth 2 (two) times daily.           . multivitamin (THERAGRAN) per tablet   Oral   Take 1 tablet by mouth daily.           . nitroGLYCERIN (NITROSTAT) 0.4 MG SL tablet   Sublingual   Place 1 tablet (0.4 mg total) under the tongue every 5 (five) minutes as needed.   25 tablet   6   . Tetrahydrozoline-Zn Sulfate (EYE DROPS A/C OP)   Ophthalmic   Apply to eye.            BP 148/82  Temp(Src) 98.3 F (36.8 C) (Oral)  Resp 20  Ht 5\' 2"  (1.575 m)  Wt 140 lb (63.504 kg)  BMI 25.60 kg/m2  SpO2 100% Physical Exam  Nursing note and vitals reviewed. Constitutional: She is oriented to person, place, and time. She appears well-developed and well-nourished.  HENT:  Head: Normocephalic.    Right Ear: External ear normal.  Left Ear: External ear normal.  Nose: Nose normal.  Dry lips and mucous membranes  Eyes: EOM are normal. Pupils are  equal, round, and reactive to light. Right eye exhibits no discharge. Left eye exhibits no discharge.  Neck: Neck supple. No spinous process tenderness and no muscular tenderness present.  Cardiovascular: Normal rate, regular  rhythm and normal heart sounds.   No murmur heard. Pulmonary/Chest: Effort normal and breath sounds normal.  Abdominal: Soft. There is no tenderness.  Neurological: She is alert and oriented to person, place, and time. She has normal strength. No cranial nerve deficit or sensory deficit. GCS eye subscore is 4. GCS verbal subscore is 5. GCS motor subscore is 6.  CN 2-12 grossly intact. 5/5 strength in all 4 extremities  Skin: Skin is warm and dry.    ED Course  LACERATION REPAIR Date/Time: 11/05/2013 1:28 PM Performed by: Sherwood Gambler T Authorized by: Sherwood Gambler T Consent: Verbal consent obtained. Risks and benefits: risks, benefits and alternatives were discussed Body area: head/neck Location details: scalp Laceration length: 3 cm Foreign bodies: no foreign bodies Tendon involvement: none Nerve involvement: none Vascular damage: no Anesthesia: local infiltration Local anesthetic: lidocaine 2% with epinephrine Anesthetic total: 6 ml Patient sedated: no Preparation: Patient was prepped and draped in the usual sterile fashion. Irrigation solution: saline Irrigation method: syringe Amount of cleaning: standard Debridement: none Degree of undermining: none Skin closure: staples Number of sutures: 4 Approximation: close Approximation difficulty: simple Dressing: antibiotic ointment Patient tolerance: Patient tolerated the procedure well with no immediate complications.   (including critical care time) Labs Review Labs Reviewed  BASIC METABOLIC PANEL - Abnormal; Notable for the following:    Sodium 131 (*)    Chloride 91 (*)    Glucose, Bld 102 (*)    GFR calc non Af Amer 85 (*)    All other components within normal limits  URINALYSIS, ROUTINE W REFLEX MICROSCOPIC - Abnormal; Notable for the following:    Hgb urine dipstick SMALL (*)    Ketones, ur 15 (*)    Protein, ur 30 (*)    All other components within normal limits  CBC  TROPONIN I  GLUCOSE, CAPILLARY    URINE MICROSCOPIC-ADD ON   Imaging Review Dg Chest 2 View  11/05/2013   CLINICAL DATA:  Fall, loss of consciousness  EXAM: CHEST  2 VIEW  COMPARISON:  DG CHEST 2 VIEW dated 10/29/2013  FINDINGS: The lungs are hyperinflated likely secondary to COPD. There is no focal parenchymal opacity, pleural effusion, or pneumothorax. There is stable mild cardiomegaly.  The osseous structures are unremarkable.  IMPRESSION: No active cardiopulmonary disease.   Electronically Signed   By: Kathreen Devoid   On: 11/05/2013 12:57   Ct Head Wo Contrast  11/05/2013   CLINICAL DATA:  Patient fell and hit the right side of the head.  EXAM: CT HEAD WITHOUT CONTRAST  TECHNIQUE: Contiguous axial images were obtained from the base of the skull through the vertex without intravenous contrast.  COMPARISON:  None.  FINDINGS: There is no evidence of mass effect, midline shift or extra-axial fluid collections. There is no evidence of a space-occupying lesion or intracranial hemorrhage. There is no evidence of a cortical-based area of acute infarction.  The ventricles and sulci are appropriate for the patient's age. The basal cisterns are patent.  Visualized portions of the orbits are unremarkable. The visualized portions of the paranasal sinuses and mastoid air cells are unremarkable.  The osseous structures are unremarkable. There is a large right parietal scalp hematoma and laceration.  IMPRESSION: No acute intracranial pathology.   Electronically Signed   By: Elbert Ewings  Patel   On: 11/05/2013 12:42    EKG Interpretation    Date/Time:  Sunday November 05 2013 11:14:50 EST Ventricular Rate:  72 PR Interval:  182 QRS Duration: 70 QT Interval:  378 QTC Calculation: 414 R Axis:   28 Text Interpretation:  Sinus rhythm Nonspecific T wave abnormality Confirmed by Chelsa Stout  MD, Stefan Markarian ([id number]) on 11/05/2013 1:37:24 PM            MDM   1. Syncope   2. Nausea   3. Scalp laceration    Patient appears a little dehydrated on initial  exam. She did improve with IV fluids. Her orthostatic suggestion maybe a little volume dehydrated. She has been having poor by mouth intake due to this nausea. I believe this is the cause of her syncope. There's been no palpitations during her syncope episodes suggestive of recurrent SVT. She SVT for about 5 seconds while is watching the monitor, but otherwise has been normal sinus rhythm. Her scalp laceration was repaired with staples as above. There's no intracranial bleeding. She has a normal neurologic exam. She felt a little dizzy while walking to the bathroom. I've cautioned her on getting up slowly given the dehydration and her increased metoprolol dose. I discussed her case with Dr. Forde Dandy, her primary physician, who feels comfortable with her being discharged. I discussed this with the patient and her husband, who are comfortable going home and will use Zofran as needed for nausea. She will try to increase her by mouth fluid and food intake. She has a Holter monitor meeting tomorrow and will followup with Dr. Forde Dandy in the next couple days.    Ephraim Hamburger, MD 11/05/13 [id number]

## 2013-11-05 NOTE — ED Notes (Signed)
Per EMS pt from home was dx with pneumonia and discharged from hospital last Wednesday. Pt was leaning over kitchen sink due to nausea, and got dizzy and had syncopal episode. Pt has lac to right side- gauze, pressure dressing done by EMS not bleeding through. Pt takes plavix. No other injuries or obvious deformities noted. Pt denies neck, back pain.

## 2013-11-05 NOTE — Discharge Instructions (Signed)
You need your staples removed in 7-14 days at your primary doctor's office. If your symptoms worsen in any way either call Dr. Forde Dandy or return to the ER for repeat evaluation.

## 2013-11-05 NOTE — ED Notes (Signed)
Pt had been sitting for a minute sts feels dizzy "like the room is spinning" episode lasted appx 10 seconds.

## 2013-11-06 ENCOUNTER — Encounter (INDEPENDENT_AMBULATORY_CARE_PROVIDER_SITE_OTHER): Payer: Medicare Other

## 2013-11-06 ENCOUNTER — Encounter: Payer: Self-pay | Admitting: *Deleted

## 2013-11-06 DIAGNOSIS — I471 Supraventricular tachycardia: Secondary | ICD-10-CM

## 2013-11-06 NOTE — Progress Notes (Signed)
Patient ID: [name], female   DOB: May 02, 1936, 79 y.o.   MRN: [id number] E-Cardio [facility] 30 day cardiac event monitor applied to patient.

## 2013-11-15 ENCOUNTER — Ambulatory Visit (INDEPENDENT_AMBULATORY_CARE_PROVIDER_SITE_OTHER): Payer: Medicare Other | Admitting: Cardiology

## 2013-11-15 ENCOUNTER — Encounter: Payer: Self-pay | Admitting: Cardiology

## 2013-11-15 VITALS — BP 122/68 | HR 67 | Ht 62.0 in | Wt 142.0 lb

## 2013-11-15 DIAGNOSIS — I471 Supraventricular tachycardia, unspecified: Secondary | ICD-10-CM

## 2013-11-15 DIAGNOSIS — R55 Syncope and collapse: Secondary | ICD-10-CM

## 2013-11-15 NOTE — Patient Instructions (Signed)
Your physician recommends that you schedule a follow-up appointment in: WITH DR. Beardsley IN 4 WEEKS 12/25/13  Your physician recommends that you continue on your current medications as directed. Please refer to the Current Medication list given to you today.

## 2013-11-21 NOTE — Progress Notes (Addendum)
Patient ID: [name] MRN: [id number], DOB/AGE: [birth year]   Date of Visit: 11/15/2013  Primary Physician: Sheela Stack, MD Primary Cardiologist: Percival Spanish, MD Reason for Visit: Hospital follow-up  History of Present Illness  [name] is a 78 y.o. female with coronary artery disease, preserved left ventricular function, dyslipidemia, hypertension and carotid vascular disease. She was admitted to the hospital 2 weeks ago with pneumonia. She had intermittent episodes of SVT at rates of up to 140 beats per minute while on telemetry. These were associated with palpitations and mild dyspnea. She denied CP, dizziness or syncope. She reported similar episodes at home but has never sought medical attention. She was seen in consultation by Dr. Lovena Le who reviewed ECGs and stated that it appeared to be an atrial tachycardia originating low in the right atrium. Unusual AV node reentry could also be a diagnosis. She had no ventricular preexcitation. He discussed options for treatment including medical therapy versus EPS +RF ablation. Medical therapy was elected. Her beta blocker was continued. Dr. Lovena Le did not think she was a good candidate for additional antiarrhythmic drug therapy based on her other comorbidities. Flecainide would be contraindicated. At discharge it was agreed that if her SVT worsens, or becomes more symptomatic, then catheter ablation would be recommended.   She presents today for hospital followup. Since discharge, she reports she experienced syncope on 11/05/2013. She was evaluated in the ED and found to be orthostatic and dehydrated. Dr. Forde Dandy recommended discharge from ED, increased PO intake and hydration and outpatient follow-up. An event monitor was also ordered and placed on 11/06/2013. Since that episode she has increased her activity level, PO intake and hydration. She reports now she is doing well and has no complaints other than the electrodes from the event  monitor are irritating her skin. She has been compliant in wearing the monitor everyday. She denies chest pain or shortness of breath. She denies palpitations. She denies recurrent dizziness, near syncope or syncope. She denies LE swelling, orthopnea or PND.   Past Medical History Past Medical History  Diagnosis Date  . CAD, UNSPECIFIED SITE     2009. 99% proximal RCA stenosis followed by 80% distal stenosis. The LAD has a 100% stenosis in the midsegment. The circumflex is patent with distal occlusion. She has been managed medically. Her EF was well-preserved.  Marland Kitchen CAROTID STENOSIS   . DIVERTICULAR DISEASE   . Essential hypertension, benign   . HYPERLIPIDEMIA   . MACULAR DEGENERATION   . OSTEOPOROSIS   . Rheumatoid arthritis(714.0)   . Hx of degenerative disc disease   . Gastritis   . Thyroid cancer 2008  . H/O blood clots 1984    in groin area that travels to lungs  . Gallstones 2009    Past Surgical History Past Surgical History  Procedure Laterality Date  . Appendectomy  1959  . Tonsillectomy    . Abdominal hysterectomy  1984  . Im nailing femoral shaft retrograde  2010    left  . Wrist surgery  2004    Left  . Esophagogastroduodenoscopy    . Colonoscopy    . Breast cyst excision      left  . Cholecystectomy  2009    Allergies/Intolerances Allergies  Allergen Reactions  . Hydrocodone Other (See Comments)    "feels weird"  . Prednisone Nausea And Vomiting    Oral    Current Home Medications Current Outpatient Prescriptions  Medication Sig Dispense Refill  . Acetaminophen (TYLENOL PO) Take 500  mg by mouth every 6 (six) hours as needed (pain).       Marland Kitchen aspirin 81 MG tablet Take 81 mg by mouth daily.        Marland Kitchen atorvastatin (LIPITOR) 40 MG tablet Take 40 mg by mouth daily.        . Calcium Carbonate-Vitamin D (CALCIUM + D PO) Take 1 tablet by mouth 2 (two) times daily.       . clopidogrel (PLAVIX) 75 MG tablet Take 75 mg by mouth daily.        . fish oil-omega-3 fatty  acids 1000 MG capsule Take 1 g by mouth daily.       . fluticasone (FLONASE) 50 MCG/ACT nasal spray Place 2 sprays into the nose as needed.       . isosorbide mononitrate (IMDUR) 30 MG 24 hr tablet Take 30 mg by mouth daily.        Marland Kitchen levothyroxine (SYNTHROID, LEVOTHROID) 88 MCG tablet Take 1 tablet (88 mcg total) by mouth daily before breakfast.  30 tablet  6  . losartan (COZAAR) 25 MG tablet Take 25 mg by mouth daily.        . metoprolol succinate (TOPROL-XL) 25 MG 24 hr tablet Take 3 tablets (75 mg total) by mouth 3 (three) times daily. Take with or immediately following a meal.  90 tablet  1  . Multiple Vitamins-Minerals (OCUVITE PRESERVISION PO) Take by mouth 2 (two) times daily.        . multivitamin (THERAGRAN) per tablet Take 1 tablet by mouth daily.        . nitroGLYCERIN (NITROSTAT) 0.4 MG SL tablet Place 1 tablet (0.4 mg total) under the tongue every 5 (five) minutes as needed.  25 tablet  6  . ondansetron (ZOFRAN) 4 MG tablet Take 1 tablet (4 mg total) by mouth every 6 (six) hours as needed for nausea or vomiting.  20 tablet  0  . Tetrahydrozoline-Zn Sulfate (EYE DROPS A/C OP) Place 1 drop into both eyes every 6 (six) hours as needed (for dry eyes).        No current facility-administered medications for this visit.    Social History History   Social History  . Marital Status: Married    Spouse Name: N/A    Number of Children: 1  . Years of Education: N/A   Occupational History  . retired    Social History Main Topics  . Smoking status: Never Smoker   . Smokeless tobacco: Never Used  . Alcohol Use: No  . Drug Use: No  . Sexual Activity: Not on file   Other Topics Concern  . Not on file   Social History Narrative   Married, retired   1 daughter     Review of Systems General: No chills, fever, night sweats or weight changes Cardiovascular: No chest pain, dyspnea on exertion, edema, orthopnea, palpitations, paroxysmal nocturnal dyspnea Dermatological: No rash,  lesions or masses Respiratory: No cough, dyspnea Urologic: No hematuria, dysuria Abdominal: No nausea, vomiting, diarrhea, bright red blood per rectum, melena, or hematemesis Neurologic: No visual changes, weakness, changes in mental status All other systems reviewed and are otherwise negative except as noted above.  Physical Exam Vitals: Blood pressure 122/68, pulse 67, height 5\' 2"  (1.575 m), weight 142 lb (64.411 kg).  General: Well developed, well appearing 78 y.o. female in no acute distress. HEENT: Normocephalic, atraumatic. EOMs intact. Sclera nonicteric. Oropharynx clear.  Neck: Supple without bruits. No JVD. Lungs: Respirations regular and unlabored,  CTA bilaterally. No wheezes, rales or rhonchi. Heart: RRR. S1, S2 present. No murmurs, rub, S3 or S4. Abdomen: Soft, non-distended.  Extremities: No clubbing, cyanosis or edema. PT/Radials 2+ and equal bilaterally. Psych: Normal affect. Neuro: Alert and oriented X 3. Moves all extremities spontaneously.   Assessment and Plan  1. Syncope - per ED notes, most likely related to orthostasis and dehydration - now wearing 30-day event monitor - no driving for 6 months until given clearance by Dr. Percival Spanish - return for follow-up with Dr. Percival Spanish as scheduled in 4 weeks  2. PSVT, probable atrial tachycardia - no recurrent palpitations since hospital discharge - continue BB - Ms. Mowrey requests follow-up with Dr. Percival Spanish only at this time and would like to reserve follow-up with Dr. Lovena Le only if SVT recurs   Signed, Ileene Hutchinson, PA-C 11/21/2013, 12:36 PM

## 2013-12-22 ENCOUNTER — Telehealth: Payer: Self-pay

## 2013-12-22 NOTE — Telephone Encounter (Signed)
Talked to patient about her monitor results she understand, patient has appointment on Monday 3.23.15

## 2013-12-25 ENCOUNTER — Encounter: Payer: Self-pay | Admitting: Cardiology

## 2013-12-25 ENCOUNTER — Ambulatory Visit (INDEPENDENT_AMBULATORY_CARE_PROVIDER_SITE_OTHER): Payer: Medicare Other | Admitting: Cardiology

## 2013-12-25 VITALS — BP 150/70 | HR 60 | Ht 63.0 in | Wt 142.0 lb

## 2013-12-25 DIAGNOSIS — I471 Supraventricular tachycardia: Secondary | ICD-10-CM

## 2013-12-25 DIAGNOSIS — I2510 Atherosclerotic heart disease of native coronary artery without angina pectoris: Secondary | ICD-10-CM

## 2013-12-25 NOTE — Progress Notes (Signed)
HPI The patient presents for followup of her coronary artery disease. Since I last saw her she had syncope. However, she also had had pneumonia and had lots of GI complaints related to the antibiotics. She was quite weak. There was an arrhythmia which was an atrial tachycardia. However, she was seen in consultation by our electrophysiologist and this was not felt to be the etiology. She was found to have some atrial tachycardia but there were no sustained dysrhythmias. It was hospitalized and I reviewed these records. She later came back to the emergency room with syncope but was dehydrated with orthostasis. Over the past several weeks since recovering from all of this she has done better. She's no longer having any orthostatic symptoms, presyncope or syncope. She's not having any chest pressure, neck or arm discomfort. She's back on her treadmill. She does have occasional palpitations but these are at baseline.  Allergies  Allergen Reactions  . Hydrocodone Other (See Comments)    "feels weird"  . Prednisone Nausea And Vomiting    Oral    Current Outpatient Prescriptions  Medication Sig Dispense Refill  . Acetaminophen (TYLENOL PO) Take 500 mg by mouth every 6 (six) hours as needed (pain).       Marland Kitchen aspirin 81 MG tablet Take 81 mg by mouth daily.        Marland Kitchen atorvastatin (LIPITOR) 40 MG tablet Take 40 mg by mouth daily.        . Calcium Carbonate-Vitamin D (CALCIUM + D PO) Take 1 tablet by mouth 2 (two) times daily.       . clopidogrel (PLAVIX) 75 MG tablet Take 75 mg by mouth daily.        . fish oil-omega-3 fatty acids 1000 MG capsule Take 1 g by mouth daily.       . fluticasone (FLONASE) 50 MCG/ACT nasal spray Place 2 sprays into the nose as needed.       . isosorbide mononitrate (IMDUR) 30 MG 24 hr tablet Take 30 mg by mouth daily.        Marland Kitchen levothyroxine (SYNTHROID, LEVOTHROID) 88 MCG tablet Take 1 tablet (88 mcg total) by mouth daily before breakfast.  30 tablet  6  . losartan (COZAAR) 25  MG tablet Take 25 mg by mouth daily.        . metoprolol succinate (TOPROL-XL) 25 MG 24 hr tablet Take 3 tablets (75 mg total) by mouth 3 (three) times daily. Take with or immediately following a meal.  90 tablet  1  . metoprolol tartrate (LOPRESSOR) 25 MG tablet       . mometasone (NASONEX) 50 MCG/ACT nasal spray Place 2 sprays into the nose daily.      . Multiple Vitamins-Minerals (OCUVITE PRESERVISION PO) Take by mouth 2 (two) times daily.        . multivitamin (THERAGRAN) per tablet Take 1 tablet by mouth daily.        . nitroGLYCERIN (NITROSTAT) 0.4 MG SL tablet Place 1 tablet (0.4 mg total) under the tongue every 5 (five) minutes as needed.  25 tablet  6  . ondansetron (ZOFRAN) 4 MG tablet Take 1 tablet (4 mg total) by mouth every 6 (six) hours as needed for nausea or vomiting.  20 tablet  0  . Tetrahydrozoline-Zn Sulfate (EYE DROPS A/C OP) Place 1 drop into both eyes every 6 (six) hours as needed (for dry eyes).        No current facility-administered medications for this visit.  Past Medical History  Diagnosis Date  . CAD, UNSPECIFIED SITE     2009. 99% proximal RCA stenosis followed by 80% distal stenosis. The LAD has a 100% stenosis in the midsegment. The circumflex is patent with distal occlusion. She has been managed medically. Her EF was well-preserved.  Marland Kitchen CAROTID STENOSIS   . DIVERTICULAR DISEASE   . Essential hypertension, benign   . HYPERLIPIDEMIA   . MACULAR DEGENERATION   . OSTEOPOROSIS   . Rheumatoid arthritis(714.0)   . Hx of degenerative disc disease   . Gastritis   . Thyroid cancer 2008  . H/O blood clots 1984    in groin area that travels to lungs  . Gallstones 2009    Past Surgical History  Procedure Laterality Date  . Appendectomy  1959  . Tonsillectomy    . Abdominal hysterectomy  1984  . Im nailing femoral shaft retrograde  2010    left  . Wrist surgery  2004    Left  . Esophagogastroduodenoscopy    . Colonoscopy    . Breast cyst excision       left  . Cholecystectomy  2009    ROS:  As stated in the HPI and negative for all other systems.  PHYSICAL EXAM BP 150/70  Pulse 60  Ht 5\' 3"  (1.6 m)  Wt 142 lb (64.411 kg)  BMI 25.16 kg/m2 GENERAL:  Well appearing HEENT:  Pupils equal round and reactive, fundi not visualized, oral mucosa unremarkable NECK:  No jugular venous distention, waveform within normal limits, carotid upstroke brisk and symmetric, no bruits, no thyromegaly LUNGS:  Clear to auscultation bilaterally BACK:  No CVA tenderness CHEST:  Unremarkable HEART:  PMI not displaced or sustained,S1 and S2 within normal limits, no S3, no S4, no clicks, no rubs, no murmurs ABD:  Flat, positive bowel sounds normal in frequency in pitch, no bruits, no rebound, no guarding, no midline pulsatile mass, no hepatomegaly, no splenomegaly EXT:  2 plus pulses upper and diminished DP/PT bilateral as I have a, no edema, no cyanosis no clubbing   ASSESSMENT AND PLAN  SYNCOPE:  This was probably related to her pneumonia and illness. She's had no further episodes. No further therapy is indicated.  ATRIAL TACHYCARDIA:   This was evaluated by EP.  There were no sustained dysrhythmias. She has very mild symptoms. No change in therapy is indicated.  CAD: The patient has no new sypmtoms.  No further cardiovascular testing is indicated.  We will continue with aggressive risk reduction and meds as listed.  HTN:   Her blood pressure is slightly elevated and I suggested that she should keep a BP diary.  If her pressures remain elevated we can increase the Cozaar.  HYPERLIPIDEMIA:  This is followed by . SOUTH,STEPHEN ALAN, MDA  CAROTID STENOSIS:  She has less than 59% bilateral stenosis and I will check a followup Doppler in 6 months.  Hospital and emergency department records reviewed

## 2013-12-25 NOTE — Patient Instructions (Signed)
The current medical regimen is effective;  continue present plan and medications.  Your physician has requested that you have a carotid duplex in 6 months. This test is an ultrasound of the carotid arteries in your neck. It looks at blood flow through these arteries that supply the brain with blood. Allow one hour for this exam. There are no restrictions or special instructions.  Follow up in 6 months with Dr Hochrein.  You will receive a letter in the mail 2 months before you are due.  Please call us when you receive this letter to schedule your follow up appointment.  

## 2014-06-22 ENCOUNTER — Other Ambulatory Visit (HOSPITAL_COMMUNITY): Payer: Self-pay | Admitting: Cardiology

## 2014-06-22 DIAGNOSIS — I6529 Occlusion and stenosis of unspecified carotid artery: Secondary | ICD-10-CM

## 2014-06-25 ENCOUNTER — Inpatient Hospital Stay (HOSPITAL_COMMUNITY): Admission: RE | Admit: 2014-06-25 | Payer: Medicare Other | Source: Ambulatory Visit

## 2014-06-25 ENCOUNTER — Ambulatory Visit (HOSPITAL_COMMUNITY)
Admission: RE | Admit: 2014-06-25 | Discharge: 2014-06-25 | Disposition: A | Discharge status: Home / Self Care | Payer: Medicare Other | Source: Ambulatory Visit | Attending: Cardiology | Admitting: Cardiology

## 2014-06-25 ENCOUNTER — Ambulatory Visit (INDEPENDENT_AMBULATORY_CARE_PROVIDER_SITE_OTHER): Payer: Medicare Other | Admitting: Cardiology

## 2014-06-25 VITALS — BP 142/80 | HR 57 | Ht 62.0 in | Wt 147.0 lb

## 2014-06-25 DIAGNOSIS — I6529 Occlusion and stenosis of unspecified carotid artery: Secondary | ICD-10-CM | POA: Diagnosis not present

## 2014-06-25 DIAGNOSIS — I10 Essential (primary) hypertension: Secondary | ICD-10-CM

## 2014-06-25 MED ORDER — FUROSEMIDE 20 MG PO TABS: 20.0000 mg | ORAL_TABLET | Freq: Every day | ORAL | Status: DC

## 2014-06-25 NOTE — Progress Notes (Signed)
Carotid Duplex Completed. [name], BS, RDMS, RVT  

## 2014-06-25 NOTE — Patient Instructions (Addendum)
Your physician recommends that you schedule a follow-up appointment in: one year with Dr. Percival Spanish  Take three tablets of the metoprolol succinate three times a day  Take three tabs of the metoprolol tartrate three times a day   In to months we will change you to 200 mg of the metoprolol succinate daily

## 2014-06-25 NOTE — Progress Notes (Signed)
HPI The patient presents for followup of her coronary artery disease.  Since I last saw her she has done well.  She denies any syncope that she had after her pneumonia.  The patient denies any new symptoms such as chest discomfort, neck or arm discomfort. There has been no new shortness of breath, PND or orthopnea. There have been no reported palpitations, presyncope or syncope.    Allergies  Allergen Reactions  . Hydrocodone Other (See Comments)    "feels weird"  . Prednisone Nausea And Vomiting    Oral    Current Outpatient Prescriptions  Medication Sig Dispense Refill  . Acetaminophen (TYLENOL PO) Take 500 mg by mouth every 6 (six) hours as needed (pain).       Marland Kitchen aspirin 81 MG tablet Take 81 mg by mouth daily.        Marland Kitchen atorvastatin (LIPITOR) 40 MG tablet Take 40 mg by mouth daily.        . Calcium Carbonate-Vitamin D (CALCIUM + D PO) Take 1 tablet by mouth 2 (two) times daily.       . clopidogrel (PLAVIX) 75 MG tablet Take 75 mg by mouth daily.        . fish oil-omega-3 fatty acids 1000 MG capsule Take 1 g by mouth daily.       . isosorbide mononitrate (IMDUR) 30 MG 24 hr tablet Take 30 mg by mouth daily.        Marland Kitchen levothyroxine (SYNTHROID, LEVOTHROID) 88 MCG tablet Take 1 tablet (88 mcg total) by mouth daily before breakfast.  30 tablet  6  . losartan (COZAAR) 25 MG tablet Take 25 mg by mouth daily.        . metoprolol tartrate (LOPRESSOR) 25 MG tablet Take 25 mg by mouth 3 (three) times daily.       . mometasone (NASONEX) 50 MCG/ACT nasal spray Place 2 sprays into the nose daily.      . Multiple Vitamins-Minerals (OCUVITE PRESERVISION PO) Take by mouth 2 (two) times daily.        . multivitamin (THERAGRAN) per tablet Take 1 tablet by mouth daily.        . nitroGLYCERIN (NITROSTAT) 0.4 MG SL tablet Place 1 tablet (0.4 mg total) under the tongue every 5 (five) minutes as needed.  25 tablet  6  . ondansetron (ZOFRAN) 4 MG tablet Take 1 tablet (4 mg total) by mouth every 6 (six) hours  as needed for nausea or vomiting.  20 tablet  0  . Tetrahydrozoline-Zn Sulfate (EYE DROPS A/C OP) Place 1 drop into both eyes every 6 (six) hours as needed (for dry eyes).        No current facility-administered medications for this visit.    Past Medical History  Diagnosis Date  . CAD, UNSPECIFIED SITE     2009. 99% proximal RCA stenosis followed by 80% distal stenosis. The LAD has a 100% stenosis in the midsegment. The circumflex is patent with distal occlusion. She has been managed medically. Her EF was well-preserved.  Marland Kitchen CAROTID STENOSIS   . DIVERTICULAR DISEASE   . Essential hypertension, benign   . HYPERLIPIDEMIA   . MACULAR DEGENERATION   . OSTEOPOROSIS   . Rheumatoid arthritis(714.0)   . Hx of degenerative disc disease   . Gastritis   . Thyroid cancer 2008  . H/O blood clots 1984    in groin area that travels to lungs  . Gallstones 2009    Past Surgical History  Procedure  Laterality Date  . Appendectomy  1959  . Tonsillectomy    . Abdominal hysterectomy  1984  . Im nailing femoral shaft retrograde  2010    left  . Wrist surgery  2004    Left  . Esophagogastroduodenoscopy    . Colonoscopy    . Breast cyst excision      left  . Cholecystectomy  2009    ROS:  As stated in the HPI and negative for all other systems.  PHYSICAL EXAM Ht 5\' 2"  (1.575 m)  Wt 147 lb (66.679 kg)  BMI 26.88 kg/m2 GENERAL:  Well appearing HEENT:  Pupils equal round and reactive, fundi not visualized, oral mucosa unremarkable NECK:  No jugular venous distention, waveform within normal limits, carotid upstroke brisk and symmetric, no bruits, no thyromegaly LUNGS:  Clear to auscultation bilaterally BACK:  No CVA tenderness CHEST:  Unremarkable HEART:  PMI not displaced or sustained,S1 and S2 within normal limits, no S3, no S4, no clicks, no rubs, no murmurs ABD:  Flat, positive bowel sounds normal in frequency in pitch, no bruits, no rebound, no guarding, no midline pulsatile mass, no  hepatomegaly, no splenomegaly EXT:  2 plus pulses upper and diminished DP/PT bilateral as I have a, no edema, no cyanosis no clubbing  EKG:  Sinus rhythm, rate 57, first degree AV block, no acute ST-T wave changes. 06/25/2014  ASSESSMENT AND PLAN   ATRIAL TACHYCARDIA:   This was evaluated by EP.  There were no sustained dysrhythmias. She has very mild symptoms. No change in therapy is indicated.  CAD: The patient has no new sypmtoms.  No further cardiovascular testing is indicated.  We will continue with aggressive risk reduction and meds as listed.  HTN:   Her blood pressure is slightly elevated .  However, we were very unclear on her beta blocker dosing and she was somewhat confused. Last time she was in the hospital she was discharged on 75 mg 3 times a day of metoprolol. 50 of this was long acting and 25 immediate release apparently. This is a complicated regimen. She's going to home review her bottles and clarify the dose with [place]. Most likely when she runs out of her current prescription 2 months I will switch her to 200 mg XL daily and watch her blood pressure closely.  HYPERLIPIDEMIA:  This is followed by . SOUTH,STEPHEN ALAN, MDA  CAROTID STENOSIS:  This was checked today.  We will follow up with the final results.

## 2014-06-26 ENCOUNTER — Encounter: Payer: Self-pay | Admitting: Cardiology

## 2014-06-27 ENCOUNTER — Telehealth: Payer: Self-pay | Admitting: Cardiology

## 2014-06-27 ENCOUNTER — Other Ambulatory Visit: Payer: Self-pay | Admitting: *Deleted

## 2014-06-27 NOTE — Telephone Encounter (Signed)
Pt stated that the pharmacy called her about picking up a prescription for Lasix and when she was in for an OV with Dr. Percival Spanish on 9/21 he did not mention anything to her about starting this medication. Please call   Thanks

## 2014-06-27 NOTE — Telephone Encounter (Signed)
Spoke with patient. She states a [facility] that she had never used called her to tell her lasix was available for her to pick up on 9/21 - not Rx'ed by Dr. Percival Spanish at [facility] that day. RN cannot trace in EPIC if this medication was sent in my accident, however this [facility] is listed on her file as preferred pharmacy. Pharmacy staff told patient the Rx came from Dr. Rosezella [place] office. RN advised patient NOT to take medication as NO notation was made to start this - only med changes were regarding metoprolol.   RN called pharmacy to cancel Rx from patient's file. Rx was sent in by J. Tressie Stalker on 9/21  Patient uses [facility] on [address].   Message routed to [facility] to address and inform if patient is to be on this medication..  Patient was also informed of carotid doppler results during this telephone call.

## 2015-04-17 ENCOUNTER — Other Ambulatory Visit: Payer: Self-pay | Admitting: Dermatology

## 2015-06-28 ENCOUNTER — Ambulatory Visit (INDEPENDENT_AMBULATORY_CARE_PROVIDER_SITE_OTHER): Payer: Medicare Other | Admitting: Cardiology

## 2015-06-28 ENCOUNTER — Encounter: Payer: Self-pay | Admitting: Cardiology

## 2015-06-28 VITALS — BP 168/86 | HR 58 | Ht 61.0 in | Wt 146.0 lb

## 2015-06-28 DIAGNOSIS — E785 Hyperlipidemia, unspecified: Secondary | ICD-10-CM | POA: Diagnosis not present

## 2015-06-28 DIAGNOSIS — I2510 Atherosclerotic heart disease of native coronary artery without angina pectoris: Secondary | ICD-10-CM | POA: Diagnosis not present

## 2015-06-28 DIAGNOSIS — I10 Essential (primary) hypertension: Secondary | ICD-10-CM | POA: Diagnosis not present

## 2015-06-28 NOTE — Addendum Note (Signed)
Addended by: Vennie Homans on: 06/28/2015 10:41 AM   Modules accepted: Orders

## 2015-06-28 NOTE — Patient Instructions (Addendum)
Your physician wants you to follow-up in: 6 Months. You will receive a reminder letter in the mail two months in advance. If you don't receive a letter, please call our office to schedule the follow-up appointment.  Your physician wants you to keep a Blood Pressure diary for the for the next 2 weeks  Your physician recommends that you return for lab work in: Fasting lipids  Your physician has requested that you have an exercise tolerance test. For further information please visit [email or web address]. Please also follow instruction sheet, as given.

## 2015-06-28 NOTE — Progress Notes (Signed)
HPI The patient presents for followup of her coronary artery disease.  Since I last saw her she has had one episode of chest discomfort. This is about 2 weeks ago. It was a pressure across her chest for which she took 2 nitroglycerin. She's not had anything since that. She's not had this pain prior. She's walking a treadmill 5 times per week. With this she denies any cardiovascular symptoms. She's had no new shortness of breath, PND or orthopnea. There have been no reported palpitations, presyncope or syncope.  She has not had any of the symptoms she had with her SVT previously.  Allergies  Allergen Reactions  . Hydrocodone Other (See Comments)    "feels weird"  . Prednisone Nausea And Vomiting    Oral    Current Outpatient Prescriptions  Medication Sig Dispense Refill  . Acetaminophen (TYLENOL PO) Take 500 mg by mouth every 6 (six) hours as needed (pain).     Marland Kitchen aspirin 81 MG tablet Take 81 mg by mouth daily.      Marland Kitchen atorvastatin (LIPITOR) 40 MG tablet Take 40 mg by mouth daily.      . Calcium Carbonate-Vitamin D (CALCIUM + D PO) Take 1 tablet by mouth 2 (two) times daily.     . clopidogrel (PLAVIX) 75 MG tablet Take 75 mg by mouth daily.      . fish oil-omega-3 fatty acids 1000 MG capsule Take 1 g by mouth daily.     . isosorbide mononitrate (IMDUR) 30 MG 24 hr tablet Take 30 mg by mouth daily.      Marland Kitchen levothyroxine (SYNTHROID, LEVOTHROID) 88 MCG tablet Take 1 tablet (88 mcg total) by mouth daily before breakfast. 30 tablet 6  . losartan (COZAAR) 25 MG tablet Take 25 mg by mouth daily.      . metoprolol succinate (TOPROL-XL) 100 MG 24 hr tablet Take 100 mg by mouth 2 (two) times daily. Take with or immediately following a meal.    . mometasone (NASONEX) 50 MCG/ACT nasal spray Place 2 sprays into the nose daily.    . Multiple Vitamins-Minerals (OCUVITE PRESERVISION PO) Take by mouth 2 (two) times daily.      . multivitamin (THERAGRAN) per tablet Take 1 tablet by mouth daily.      .  nitroGLYCERIN (NITROSTAT) 0.4 MG SL tablet Place 1 tablet (0.4 mg total) under the tongue every 5 (five) minutes as needed. 25 tablet 6  . ondansetron (ZOFRAN) 4 MG tablet Take 1 tablet (4 mg total) by mouth every 6 (six) hours as needed for nausea or vomiting. 20 tablet 0  . Tetrahydrozoline-Zn Sulfate (EYE DROPS A/C OP) Place 1 drop into both eyes every 6 (six) hours as needed (for dry eyes).      No current facility-administered medications for this visit.    Past Medical History  Diagnosis Date  . CAD, UNSPECIFIED SITE     2009. 99% proximal RCA stenosis followed by 80% distal stenosis. The LAD has a 100% stenosis in the midsegment. The circumflex is patent with distal occlusion. She has been managed medically. Her EF was well-preserved.  Marland Kitchen CAROTID STENOSIS   . DIVERTICULAR DISEASE   . Essential hypertension, benign   . HYPERLIPIDEMIA   . MACULAR DEGENERATION   . OSTEOPOROSIS   . Rheumatoid arthritis(714.0)   . Hx of degenerative disc disease   . Gastritis   . Thyroid cancer 2008  . H/O blood clots 1984    in groin area that travels to lungs  .  Gallstones 2009    Past Surgical History  Procedure Laterality Date  . Appendectomy  1959  . Tonsillectomy    . Abdominal hysterectomy  1984  . Im nailing femoral shaft retrograde  2010    left  . Wrist surgery  2004    Left  . Esophagogastroduodenoscopy    . Colonoscopy    . Breast cyst excision      left  . Cholecystectomy  2009    ROS:  As stated in the HPI and negative for all other systems.  PHYSICAL EXAM BP 168/86 mmHg  Pulse 58  Ht 5\' 1"  (1.549 m)  Wt 146 lb (66.225 kg)  BMI 27.60 kg/m2 GENERAL:  Well appearing HEENT:  Pupils equal round and reactive, fundi not visualized, oral mucosa unremarkable NECK:  No jugular venous distention, waveform within normal limits, carotid upstroke brisk and symmetric, no bruits, no thyromegaly LUNGS:  Clear to auscultation bilaterally BACK:  No CVA tenderness CHEST:   Unremarkable HEART:  PMI not displaced or sustained,S1 and S2 within normal limits, no S3, no S4, no clicks, no rubs, no murmurs ABD:  Flat, positive bowel sounds normal in frequency in pitch, no bruits, no rebound, no guarding, no midline pulsatile mass, no hepatomegaly, no splenomegaly EXT:  2 plus pulses upper and diminished DP/PT bilateral as I have a, no edema, no cyanosis no clubbing  EKG:  Sinus rhythm, rate 58, first degree AV block, no acute ST-T wave changes. 06/28/2015  ASSESSMENT AND PLAN   SVT:   She has had no symptomatic recurrence of this. No further testing is indicated.  CAD:  I don't think this represents necessarily unstable angina as it has not been recurrent in the last 2 weeks. I will try to screen her with a modified Bruce protocol POET (Plain Old Exercise Treadmill).  HTN:  She will keep a BP diary for two weeks.  I will follow up on this.    HYPERLIPIDEMIA:  This is followed by . Sheela Stack, MD.

## 2015-07-02 ENCOUNTER — Encounter: Payer: Self-pay | Admitting: Cardiology

## 2015-07-05 LAB — LIPID PANEL
CHOL/HDL RATIO: 2 ratio (ref <=5.0)
Cholesterol: 157 mg/dL (ref 125–200)
HDL: 79 mg/dL (ref >=46)
LDL CALC: 61 mg/dL (ref <130)
TRIGLYCERIDES: 84 mg/dL (ref <150)
VLDL: 17 mg/dL (ref <30)

## 2015-07-30 ENCOUNTER — Ambulatory Visit (HOSPITAL_COMMUNITY): Admission: RE | Admit: 2015-07-30 | Payer: Medicare Other | Source: Ambulatory Visit

## 2015-07-30 ENCOUNTER — Other Ambulatory Visit (HOSPITAL_COMMUNITY): Payer: Self-pay | Admitting: Endocrinology

## 2015-07-31 ENCOUNTER — Ambulatory Visit (HOSPITAL_COMMUNITY)
Admission: RE | Admit: 2015-07-31 | Discharge: 2015-07-31 | Disposition: A | Discharge status: Home / Self Care | Payer: Medicare Other | Source: Ambulatory Visit | Attending: Endocrinology | Admitting: Endocrinology

## 2015-07-31 DIAGNOSIS — M810 Age-related osteoporosis without current pathological fracture: Secondary | ICD-10-CM | POA: Insufficient documentation

## 2015-07-31 MED ORDER — DENOSUMAB 60 MG/ML ~~LOC~~ SOLN
60.0000 mg | Freq: Once | SUBCUTANEOUS | Status: AC
  Administered 2015-07-31: 60 mg via SUBCUTANEOUS
  Filled 2015-07-31: qty 1

## 2015-08-01 ENCOUNTER — Telehealth (HOSPITAL_COMMUNITY): Payer: Self-pay

## 2015-08-01 ENCOUNTER — Telehealth: Payer: Self-pay | Admitting: *Deleted

## 2015-08-01 MED ORDER — LOSARTAN POTASSIUM 50 MG PO TABS: 50.0000 mg | ORAL_TABLET | Freq: Every day | ORAL | Status: DC

## 2015-08-01 NOTE — Telephone Encounter (Signed)
Spoke with pt letting her know, Dr Percival Spanish want her to increase her Losartan to 50 mg daily, pt voice understanding and #[phone] refills was electronically send into [facility]

## 2015-08-01 NOTE — Telephone Encounter (Signed)
Encounter complete. 

## 2015-08-02 ENCOUNTER — Telehealth (HOSPITAL_COMMUNITY): Payer: Self-pay

## 2015-08-02 NOTE — Telephone Encounter (Signed)
Encounter complete. 

## 2015-08-06 ENCOUNTER — Ambulatory Visit (HOSPITAL_COMMUNITY)
Admission: RE | Admit: 2015-08-06 | Discharge: 2015-08-06 | Disposition: A | Discharge status: Home / Self Care | Payer: Medicare Other | Source: Ambulatory Visit | Attending: Cardiology | Admitting: Cardiology

## 2015-08-06 DIAGNOSIS — I2510 Atherosclerotic heart disease of native coronary artery without angina pectoris: Secondary | ICD-10-CM | POA: Diagnosis not present

## 2015-08-06 DIAGNOSIS — I10 Essential (primary) hypertension: Secondary | ICD-10-CM | POA: Diagnosis not present

## 2015-08-06 LAB — EXERCISE TOLERANCE TEST
CSEPED: 4 min
CSEPEDS: 30 s
CSEPEW: 6.3 METS
CSEPHR: 95 %
CSEPPHR: 137 {beats}/min
MPHR: 144 {beats}/min
RPE: 17
Rest HR: 64 {beats}/min

## 2016-01-08 ENCOUNTER — Other Ambulatory Visit (HOSPITAL_COMMUNITY): Payer: Self-pay | Admitting: *Deleted

## 2016-01-09 ENCOUNTER — Ambulatory Visit (HOSPITAL_COMMUNITY)
Admission: RE | Admit: 2016-01-09 | Discharge: 2016-01-09 | Disposition: A | Discharge status: Home / Self Care | Payer: Medicare Other | Source: Ambulatory Visit | Attending: Endocrinology | Admitting: Endocrinology

## 2016-01-09 DIAGNOSIS — M810 Age-related osteoporosis without current pathological fracture: Secondary | ICD-10-CM | POA: Insufficient documentation

## 2016-01-09 MED ORDER — DENOSUMAB 60 MG/ML ~~LOC~~ SOLN
60.0000 mg | Freq: Once | SUBCUTANEOUS | Status: AC
  Administered 2016-01-09: 60 mg via SUBCUTANEOUS
  Filled 2016-01-09: qty 1

## 2016-03-09 DIAGNOSIS — Z1231 Encounter for screening mammogram for malignant neoplasm of breast: Secondary | ICD-10-CM | POA: Diagnosis not present

## 2016-03-10 DIAGNOSIS — H04322 Acute dacryocystitis of left lacrimal passage: Secondary | ICD-10-CM | POA: Diagnosis not present

## 2016-03-24 DIAGNOSIS — H04551 Acquired stenosis of right nasolacrimal duct: Secondary | ICD-10-CM | POA: Diagnosis not present

## 2016-03-24 DIAGNOSIS — H04322 Acute dacryocystitis of left lacrimal passage: Secondary | ICD-10-CM | POA: Diagnosis not present

## 2016-03-24 DIAGNOSIS — H04221 Epiphora due to insufficient drainage, right lacrimal gland: Secondary | ICD-10-CM | POA: Diagnosis not present

## 2016-03-24 DIAGNOSIS — H04222 Epiphora due to insufficient drainage, left lacrimal gland: Secondary | ICD-10-CM | POA: Diagnosis not present

## 2016-03-24 DIAGNOSIS — H04552 Acquired stenosis of left nasolacrimal duct: Secondary | ICD-10-CM | POA: Diagnosis not present

## 2016-03-25 DIAGNOSIS — C73 Malignant neoplasm of thyroid gland: Secondary | ICD-10-CM | POA: Diagnosis not present

## 2016-03-25 DIAGNOSIS — E890 Postprocedural hypothyroidism: Secondary | ICD-10-CM | POA: Diagnosis not present

## 2016-03-25 DIAGNOSIS — I471 Supraventricular tachycardia: Secondary | ICD-10-CM | POA: Diagnosis not present

## 2016-03-25 DIAGNOSIS — M810 Age-related osteoporosis without current pathological fracture: Secondary | ICD-10-CM | POA: Diagnosis not present

## 2016-03-25 DIAGNOSIS — G250 Essential tremor: Secondary | ICD-10-CM | POA: Diagnosis not present

## 2016-03-25 DIAGNOSIS — E784 Other hyperlipidemia: Secondary | ICD-10-CM | POA: Diagnosis not present

## 2016-03-25 DIAGNOSIS — I2510 Atherosclerotic heart disease of native coronary artery without angina pectoris: Secondary | ICD-10-CM | POA: Diagnosis not present

## 2016-03-25 DIAGNOSIS — I6529 Occlusion and stenosis of unspecified carotid artery: Secondary | ICD-10-CM | POA: Diagnosis not present

## 2016-03-25 DIAGNOSIS — H3530 Unspecified macular degeneration: Secondary | ICD-10-CM | POA: Diagnosis not present

## 2016-03-25 DIAGNOSIS — E871 Hypo-osmolality and hyponatremia: Secondary | ICD-10-CM | POA: Diagnosis not present

## 2016-04-23 DIAGNOSIS — H31009 Unspecified chorioretinal scars, unspecified eye: Secondary | ICD-10-CM | POA: Diagnosis not present

## 2016-04-23 DIAGNOSIS — H43822 Vitreomacular adhesion, left eye: Secondary | ICD-10-CM | POA: Diagnosis not present

## 2016-04-23 DIAGNOSIS — H35363 Drusen (degenerative) of macula, bilateral: Secondary | ICD-10-CM | POA: Diagnosis not present

## 2016-04-23 DIAGNOSIS — H43811 Vitreous degeneration, right eye: Secondary | ICD-10-CM | POA: Diagnosis not present

## 2016-04-23 DIAGNOSIS — H353131 Nonexudative age-related macular degeneration, bilateral, early dry stage: Secondary | ICD-10-CM | POA: Diagnosis not present

## 2016-07-16 ENCOUNTER — Other Ambulatory Visit (HOSPITAL_COMMUNITY): Payer: Self-pay | Admitting: *Deleted

## 2016-07-17 ENCOUNTER — Ambulatory Visit (HOSPITAL_COMMUNITY)
Admission: RE | Admit: 2016-07-17 | Discharge: 2016-07-17 | Disposition: A | Discharge status: Home / Self Care | Payer: Medicare Other | Source: Ambulatory Visit | Attending: Endocrinology | Admitting: Endocrinology

## 2016-07-17 DIAGNOSIS — M810 Age-related osteoporosis without current pathological fracture: Secondary | ICD-10-CM | POA: Insufficient documentation

## 2016-07-17 MED ORDER — DENOSUMAB 60 MG/ML ~~LOC~~ SOLN
60.0000 mg | Freq: Once | SUBCUTANEOUS | Status: AC
  Administered 2016-07-17: 60 mg via SUBCUTANEOUS
  Filled 2016-07-17: qty 1

## 2016-07-23 DIAGNOSIS — Z23 Encounter for immunization: Secondary | ICD-10-CM | POA: Diagnosis not present

## 2016-09-03 ENCOUNTER — Encounter: Payer: Self-pay | Admitting: Cardiology

## 2016-09-03 ENCOUNTER — Ambulatory Visit (INDEPENDENT_AMBULATORY_CARE_PROVIDER_SITE_OTHER): Payer: Medicare Other | Admitting: Cardiology

## 2016-09-03 VITALS — BP 160/72 | HR 59 | Ht 61.0 in | Wt 141.0 lb

## 2016-09-03 DIAGNOSIS — I10 Essential (primary) hypertension: Secondary | ICD-10-CM | POA: Diagnosis not present

## 2016-09-03 DIAGNOSIS — I2510 Atherosclerotic heart disease of native coronary artery without angina pectoris: Secondary | ICD-10-CM | POA: Diagnosis not present

## 2016-09-03 DIAGNOSIS — I471 Supraventricular tachycardia: Secondary | ICD-10-CM | POA: Diagnosis not present

## 2016-09-03 MED ORDER — METOPROLOL TARTRATE 25 MG PO TABS: 25.0000 mg | ORAL_TABLET | ORAL | 6 refills | Status: DC | PRN

## 2016-09-03 NOTE — Patient Instructions (Addendum)
Medication Instructions:  TAKE- Metoprolol Tartrate 25 mg as needed for fast heart rate and palpitations  Labwork: None Ordered  Testing/Procedures: None Ordered  Follow-Up: Your physician recommends that you schedule a follow-up appointment in: 3 Months  Any Other Special Instructions Will Be Listed Below (If Applicable).   If you need a refill on your cardiac medications before your next appointment, please call your pharmacy.

## 2016-09-03 NOTE — Progress Notes (Signed)
HPI The patient presents for followup of her coronary artery disease.  Since I last saw her she has had one episode of tachypalpitations. This happened 6-8 months ago. She had a rapid heart rate and her blood pressure was elevated. She said the systolic was above. She's not sure with heart rate was. She took sublingual nitroglycerin symptoms eventually resolved. She did have a little chest discomfort with this. She's otherwise been able to continue to walk on her treadmill. She'll exercise 5 times per week. She denies any chest pressure, neck or arm discomfort. She's had no palpitations, presyncope or syncope. She's had no shortness of breath, PND or orthopnea.  Allergies  Allergen Reactions  . Hydrocodone Other (See Comments)    "feels weird"  . Prednisone Nausea And Vomiting    Oral    Current Outpatient Prescriptions  Medication Sig Dispense Refill  . Acetaminophen (TYLENOL PO) Take 500 mg by mouth every 6 (six) hours as needed (pain).     Marland Kitchen aspirin 81 MG tablet Take 81 mg by mouth daily.      Marland Kitchen atorvastatin (LIPITOR) 40 MG tablet Take 40 mg by mouth daily.      . Calcium Carbonate-Vitamin D (CALCIUM + D PO) Take 1 tablet by mouth 2 (two) times daily.     . clopidogrel (PLAVIX) 75 MG tablet Take 75 mg by mouth daily.      . fish oil-omega-3 fatty acids 1000 MG capsule Take 1 g by mouth daily.     . isosorbide mononitrate (IMDUR) 30 MG 24 hr tablet Take 30 mg by mouth daily.      Marland Kitchen levothyroxine (SYNTHROID, LEVOTHROID) 88 MCG tablet Take 1 tablet (88 mcg total) by mouth daily before breakfast. 30 tablet 6  . losartan (COZAAR) 50 MG tablet Take 1 tablet (50 mg total) by mouth daily. 90 tablet 3  . metoprolol succinate (TOPROL-XL) 100 MG 24 hr tablet Take 100 mg by mouth 2 (two) times daily. Take with or immediately following a meal.    . mometasone (NASONEX) 50 MCG/ACT nasal spray Place 2 sprays into the nose daily.    . Multiple Vitamins-Minerals (OCUVITE PRESERVISION PO) Take by mouth  2 (two) times daily.      . multivitamin (THERAGRAN) per tablet Take 1 tablet by mouth daily.      . nitroGLYCERIN (NITROSTAT) 0.4 MG SL tablet Place 1 tablet (0.4 mg total) under the tongue every 5 (five) minutes as needed. 25 tablet 6  . ondansetron (ZOFRAN) 4 MG tablet Take 1 tablet (4 mg total) by mouth every 6 (six) hours as needed for nausea or vomiting. 20 tablet 0  . Tetrahydrozoline-Zn Sulfate (EYE DROPS A/C OP) Place 1 drop into both eyes every 6 (six) hours as needed (for dry eyes).      No current facility-administered medications for this visit.     Past Medical History:  Diagnosis Date  . CAD, UNSPECIFIED SITE    2009. 99% proximal RCA stenosis followed by 80% distal stenosis. The LAD has a 100% stenosis in the midsegment. The circumflex is patent with distal occlusion. She has been managed medically. Her EF was well-preserved.  Marland Kitchen CAROTID STENOSIS   . DIVERTICULAR DISEASE   . Essential hypertension, benign   . Gallstones 2009  . Gastritis   . H/O blood clots 1984   in groin area that travels to lungs  . Hx of degenerative disc disease   . HYPERLIPIDEMIA   . MACULAR DEGENERATION   .  OSTEOPOROSIS   . Rheumatoid arthritis(714.0)   . Thyroid cancer 2008    Past Surgical History:  Procedure Laterality Date  . ABDOMINAL HYSTERECTOMY  1984  . APPENDECTOMY  1959  . BREAST CYST EXCISION     left  . CHOLECYSTECTOMY  2009  . COLONOSCOPY    . ESOPHAGOGASTRODUODENOSCOPY    . IM NAILING FEMORAL SHAFT RETROGRADE  2010   left  . TONSILLECTOMY    . WRIST SURGERY  2004   Left    ROS:  As stated in the HPI and negative for all other systems.  PHYSICAL EXAM There were no vitals taken for this visit. GENERAL:  Well appearing HEENT:  Pupils equal round and reactive, fundi not visualized, oral mucosa unremarkable NECK:  No jugular venous distention, waveform within normal limits, carotid upstroke brisk and symmetric, no bruits, no thyromegaly LUNGS:  Clear to auscultation  bilaterally BACK:  No CVA tenderness CHEST:  Unremarkable HEART:  PMI not displaced or sustained,S1 and S2 within normal limits, no S3, no S4, no clicks, no rubs, no murmurs ABD:  Flat, positive bowel sounds normal in frequency in pitch, no bruits, no rebound, no guarding, no midline pulsatile mass, no hepatomegaly, no splenomegaly EXT:  2 plus pulses upper and diminished DP/PT bilateral as I have a, no edema, no cyanosis no clubbing  EKG:  Sinus rhythm, rate 59 , first degree AV block, no acute ST-T wave changes. 09/03/2016  ASSESSMENT AND PLAN  SVT:   She did have one episode of tachycardia palpitations as mentioned. She will get metoprolol 25 mg immediate release to take when necessary. If she has any increasing symptoms she'll at me know.  CAD:  She has had no new consistent symptoms since her treadmill last year which was negative.  HTN:  The blood pressure is elevated today.  She will keep a two week BP diary and let me know if it is elevated.  otherwis at this time there will be no change in medications.  We will continue with therapeutic lifestyle changes (TLC).  HYPERLIPIDEMIA:  This is followed by . Sheela Stack, MD.

## 2016-09-06 ENCOUNTER — Encounter: Payer: Self-pay | Admitting: Cardiology

## 2016-09-08 ENCOUNTER — Ambulatory Visit: Payer: Medicare Other | Admitting: Cardiology

## 2016-09-24 DIAGNOSIS — R358 Other polyuria: Secondary | ICD-10-CM | POA: Diagnosis not present

## 2016-09-24 DIAGNOSIS — I10 Essential (primary) hypertension: Secondary | ICD-10-CM | POA: Diagnosis not present

## 2016-09-24 DIAGNOSIS — M810 Age-related osteoporosis without current pathological fracture: Secondary | ICD-10-CM | POA: Diagnosis not present

## 2016-09-24 DIAGNOSIS — E890 Postprocedural hypothyroidism: Secondary | ICD-10-CM | POA: Diagnosis not present

## 2016-09-24 DIAGNOSIS — R8299 Other abnormal findings in urine: Secondary | ICD-10-CM | POA: Diagnosis not present

## 2016-09-24 DIAGNOSIS — C73 Malignant neoplasm of thyroid gland: Secondary | ICD-10-CM | POA: Diagnosis not present

## 2016-10-01 DIAGNOSIS — C73 Malignant neoplasm of thyroid gland: Secondary | ICD-10-CM | POA: Diagnosis not present

## 2016-10-01 DIAGNOSIS — Z0000 Encounter for general adult medical examination without abnormal findings: Secondary | ICD-10-CM | POA: Diagnosis not present

## 2016-10-01 DIAGNOSIS — H3530 Unspecified macular degeneration: Secondary | ICD-10-CM | POA: Diagnosis not present

## 2016-10-01 DIAGNOSIS — E890 Postprocedural hypothyroidism: Secondary | ICD-10-CM | POA: Diagnosis not present

## 2016-10-01 DIAGNOSIS — I6529 Occlusion and stenosis of unspecified carotid artery: Secondary | ICD-10-CM | POA: Diagnosis not present

## 2016-10-01 DIAGNOSIS — I2510 Atherosclerotic heart disease of native coronary artery without angina pectoris: Secondary | ICD-10-CM | POA: Diagnosis not present

## 2016-10-01 DIAGNOSIS — E871 Hypo-osmolality and hyponatremia: Secondary | ICD-10-CM | POA: Diagnosis not present

## 2016-10-01 DIAGNOSIS — N6019 Diffuse cystic mastopathy of unspecified breast: Secondary | ICD-10-CM | POA: Diagnosis not present

## 2016-10-01 DIAGNOSIS — I471 Supraventricular tachycardia: Secondary | ICD-10-CM | POA: Diagnosis not present

## 2016-10-01 DIAGNOSIS — I10 Essential (primary) hypertension: Secondary | ICD-10-CM | POA: Diagnosis not present

## 2016-10-16 DIAGNOSIS — H04123 Dry eye syndrome of bilateral lacrimal glands: Secondary | ICD-10-CM | POA: Diagnosis not present

## 2016-10-16 DIAGNOSIS — H02831 Dermatochalasis of right upper eyelid: Secondary | ICD-10-CM | POA: Diagnosis not present

## 2016-10-16 DIAGNOSIS — H02834 Dermatochalasis of left upper eyelid: Secondary | ICD-10-CM | POA: Diagnosis not present

## 2016-10-16 DIAGNOSIS — H26493 Other secondary cataract, bilateral: Secondary | ICD-10-CM | POA: Diagnosis not present

## 2016-10-16 DIAGNOSIS — H353132 Nonexudative age-related macular degeneration, bilateral, intermediate dry stage: Secondary | ICD-10-CM | POA: Diagnosis not present

## 2016-10-16 DIAGNOSIS — Z961 Presence of intraocular lens: Secondary | ICD-10-CM | POA: Diagnosis not present

## 2016-10-30 DIAGNOSIS — H26492 Other secondary cataract, left eye: Secondary | ICD-10-CM | POA: Diagnosis not present

## 2016-11-13 DIAGNOSIS — H26491 Other secondary cataract, right eye: Secondary | ICD-10-CM | POA: Diagnosis not present

## 2016-11-13 DIAGNOSIS — Z961 Presence of intraocular lens: Secondary | ICD-10-CM | POA: Diagnosis not present

## 2016-11-15 NOTE — Progress Notes (Signed)
HPI The patient presents for followup of her coronary artery disease.  At the last visit she had had an episode of tachypalpitations.  Since I last saw her she has done OK.  She had a 3 week illness that kept her from exercising.  However, today she got back on the treadmill and did fine.  The patient denies any new symptoms such as chest discomfort, neck or arm discomfort. There has been no new shortness of breath, PND or orthopnea. There have been no reported palpitations, presyncope or syncope..  She has not had the palpitations as much as she had.  She has used beta blockers twice since I last saw her.  She keeps the pills with her.     Allergies  Allergen Reactions  . Hydrocodone Other (See Comments)    "feels weird"  . Prednisone Nausea And Vomiting    Oral    Current Outpatient Prescriptions  Medication Sig Dispense Refill  . Acetaminophen (TYLENOL PO) Take 500 mg by mouth every 6 (six) hours as needed (pain).     Marland Kitchen aspirin 81 MG tablet Take 81 mg by mouth daily.      Marland Kitchen atorvastatin (LIPITOR) 40 MG tablet Take 40 mg by mouth daily.      . Calcium Carbonate-Vitamin D (CALCIUM + D PO) Take 1 tablet by mouth 2 (two) times daily.     . clopidogrel (PLAVIX) 75 MG tablet Take 75 mg by mouth daily.      Marland Kitchen denosumab (PROLIA) 60 MG/ML SOLN injection Inject 60 mg into the skin every 6 (six) months. Administer in upper arm, thigh, or abdomen    . fish oil-omega-3 fatty acids 1000 MG capsule Take 1 g by mouth daily.     . isosorbide mononitrate (IMDUR) 30 MG 24 hr tablet Take 30 mg by mouth daily.      Marland Kitchen levothyroxine (SYNTHROID, LEVOTHROID) 88 MCG tablet Take 1 tablet (88 mcg total) by mouth daily before breakfast. 30 tablet 6  . losartan (COZAAR) 50 MG tablet Take 1 tablet (50 mg total) by mouth daily. 90 tablet 3  . metoprolol succinate (TOPROL-XL) 100 MG 24 hr tablet Take 100 mg by mouth 2 (two) times daily. Take with or immediately following a meal.    . metoprolol tartrate (LOPRESSOR)  25 MG tablet Take 1 tablet (25 mg total) by mouth as needed. 30 tablet 6  . Multiple Vitamins-Minerals (CENTRUM SILVER 50+WOMEN) TABS Take 1 tablet by mouth daily.    . Multiple Vitamins-Minerals (OCUVITE PRESERVISION PO) Take by mouth 2 (two) times daily.      . nitroGLYCERIN (NITROSTAT) 0.4 MG SL tablet Place 1 tablet (0.4 mg total) under the tongue every 5 (five) minutes as needed. 25 tablet 6  . Tetrahydrozoline-Zn Sulfate (EYE DROPS A/C OP) Place 1 drop into both eyes every 6 (six) hours as needed (for dry eyes).      No current facility-administered medications for this visit.     Past Medical History:  Diagnosis Date  . CAD, UNSPECIFIED SITE    2009. 99% proximal RCA stenosis followed by 80% distal stenosis. The LAD has a 100% stenosis in the midsegment. The circumflex is patent with distal occlusion. She has been managed medically. Her EF was well-preserved.  Marland Kitchen CAROTID STENOSIS   . DIVERTICULAR DISEASE   . Essential hypertension, benign   . Gallstones 2009  . Gastritis   . H/O blood clots 1984   in groin area that travels to lungs  .  Hx of degenerative disc disease   . HYPERLIPIDEMIA   . MACULAR DEGENERATION   . OSTEOPOROSIS   . Rheumatoid arthritis(714.0)   . Thyroid cancer ([place]) 2008    Past Surgical History:  Procedure Laterality Date  . ABDOMINAL HYSTERECTOMY  1984  . APPENDECTOMY  1959  . BREAST CYST EXCISION     left  . CHOLECYSTECTOMY  2009  . COLONOSCOPY    . ESOPHAGOGASTRODUODENOSCOPY    . IM NAILING FEMORAL SHAFT RETROGRADE  2010   left  . TONSILLECTOMY    . WRIST SURGERY  2004   Left    ROS:   As stated in the HPI and negative for all other systems.  PHYSICAL EXAM BP (!) 162/80   Pulse (!) 56   Ht 5\' 2"  (1.575 m)   Wt 135 lb (61.2 kg)   BMI 24.69 kg/m  GENERAL:  Well appearing NECK:  No jugular venous distention, waveform within normal limits, carotid upstroke brisk and symmetric, no bruits, no thyromegaly LUNGS:  Clear to auscultation  bilaterally BACK:  No CVA tenderness CHEST:  Unremarkable HEART:  PMI not displaced or sustained,S1 and S2 within normal limits, no S3, no S4, no clicks, no rubs, no murmurs ABD:  Flat, positive bowel sounds normal in frequency in pitch, no bruits, no rebound, no guarding, no midline pulsatile mass, no hepatomegaly, no splenomegaly EXT:  2 plus pulses upper and diminished DP/PT bilateral as I have a, no edema, no cyanosis no clubbing   ASSESSMENT AND PLAN  SVT:   She is not particularly bothered by these and will use beta blockers as needed.    CAD:  She has had no new consistent symptoms since her treadmill in late 2016 which was negative.  HTN:  The blood pressure is elevated but at home it is well controlled and she did send me these readings.  No change in therapy is planned.   HYPERLIPIDEMIA:  This is followed by . Sheela Stack, MD.

## 2016-11-16 ENCOUNTER — Encounter: Payer: Self-pay | Admitting: Cardiology

## 2016-11-16 ENCOUNTER — Ambulatory Visit (INDEPENDENT_AMBULATORY_CARE_PROVIDER_SITE_OTHER): Payer: Medicare Other | Admitting: Cardiology

## 2016-11-16 VITALS — BP 162/80 | HR 56 | Ht 62.0 in | Wt 135.0 lb

## 2016-11-16 DIAGNOSIS — I2510 Atherosclerotic heart disease of native coronary artery without angina pectoris: Secondary | ICD-10-CM | POA: Diagnosis not present

## 2016-11-16 DIAGNOSIS — R002 Palpitations: Secondary | ICD-10-CM | POA: Diagnosis not present

## 2016-11-16 NOTE — Patient Instructions (Signed)

## 2017-01-18 ENCOUNTER — Ambulatory Visit (HOSPITAL_COMMUNITY)
Admission: RE | Admit: 2017-01-18 | Discharge: 2017-01-18 | Disposition: A | Discharge status: Home / Self Care | Payer: Medicare Other | Source: Ambulatory Visit | Attending: Endocrinology | Admitting: Endocrinology

## 2017-01-18 ENCOUNTER — Encounter (INDEPENDENT_AMBULATORY_CARE_PROVIDER_SITE_OTHER): Payer: Self-pay

## 2017-01-18 ENCOUNTER — Encounter (HOSPITAL_COMMUNITY): Payer: Self-pay

## 2017-01-18 DIAGNOSIS — M810 Age-related osteoporosis without current pathological fracture: Secondary | ICD-10-CM | POA: Insufficient documentation

## 2017-01-18 MED ORDER — DENOSUMAB 60 MG/ML ~~LOC~~ SOLN
60.0000 mg | Freq: Once | SUBCUTANEOUS | Status: AC
  Administered 2017-01-18: 60 mg via SUBCUTANEOUS
  Filled 2017-01-18: qty 1

## 2017-01-18 NOTE — Progress Notes (Signed)
Prolia 60mg  SQ given to lt upper arm.  D/c instructions on Prolia given to pt.  Next appointment for 07/21/17 at 1100 given to pt along with short stay phone number.  Pt was d/c ambulatory to lobby with husband.

## 2017-01-18 NOTE — Discharge Instructions (Signed)
Denosumab injection °What is this medicine? °DENOSUMAB (den oh sue mab) slows bone breakdown. Prolia is used to treat osteoporosis in women after menopause and in men. Xgeva is used to treat a high calcium level due to cancer and to prevent bone fractures and other bone problems caused by multiple myeloma or cancer bone metastases. Xgeva is also used to treat giant cell tumor of the bone. °This medicine may be used for other purposes; ask your health care provider or pharmacist if you have questions. °COMMON BRAND NAME(S): Prolia, XGEVA °What should I tell my health care provider before I take this medicine? °They need to know if you have any of these conditions: °-dental disease °-having surgery or tooth extraction °-infection °-kidney disease °-low levels of calcium or Vitamin D in the blood °-malnutrition °-on hemodialysis °-skin conditions or sensitivity °-thyroid or parathyroid disease °-an unusual reaction to denosumab, other medicines, foods, dyes, or preservatives °-pregnant or trying to get pregnant °-breast-feeding °How should I use this medicine? °This medicine is for injection under the skin. It is given by a health care professional in a hospital or clinic setting. °If you are getting Prolia, a special MedGuide will be given to you by the pharmacist with each prescription and refill. Be sure to read this information carefully each time. °For Prolia, talk to your pediatrician regarding the use of this medicine in children. Special care may be needed. For Xgeva, talk to your pediatrician regarding the use of this medicine in children. While this drug may be prescribed for children as young as 13 years for selected conditions, precautions do apply. °Overdosage: If you think you have taken too much of this medicine contact a poison control center or emergency room at once. °NOTE: This medicine is only for you. Do not share this medicine with others. °What if I miss a dose? °It is important not to miss your  dose. Call your doctor or health care professional if you are unable to keep an appointment. °What may interact with this medicine? °Do not take this medicine with any of the following medications: °-other medicines containing denosumab °This medicine may also interact with the following medications: °-medicines that lower your chance of fighting infection °-steroid medicines like prednisone or cortisone °This list may not describe all possible interactions. Give your health care provider a list of all the medicines, herbs, non-prescription drugs, or dietary supplements you use. Also tell them if you smoke, drink alcohol, or use illegal drugs. Some items may interact with your medicine. °What should I watch for while using this medicine? °Visit your doctor or health care professional for regular checks on your progress. Your doctor or health care professional may order blood tests and other tests to see how you are doing. °Call your doctor or health care professional for advice if you get a fever, chills or sore throat, or other symptoms of a cold or flu. Do not treat yourself. This drug may decrease your body's ability to fight infection. Try to avoid being around people who are sick. °You should make sure you get enough calcium and vitamin D while you are taking this medicine, unless your doctor tells you not to. Discuss the foods you eat and the vitamins you take with your health care professional. °See your dentist regularly. Brush and floss your teeth as directed. Before you have any dental work done, tell your dentist you are receiving this medicine. °Do not become pregnant while taking this medicine or for 5 months after stopping   it. Talk with your doctor or health care professional about your birth control options while taking this medicine. Women should inform their doctor if they wish to become pregnant or think they might be pregnant. There is a potential for serious side effects to an unborn child. Talk  to your health care professional or pharmacist for more information. What side effects may I notice from receiving this medicine? Side effects that you should report to your doctor or health care professional as soon as possible: -allergic reactions like skin rash, itching or hives, swelling of the face, lips, or tongue -bone pain -breathing problems -dizziness -jaw pain, especially after dental work -redness, blistering, peeling of the skin -signs and symptoms of infection like fever or chills; cough; sore throat; pain or trouble passing urine -signs of low calcium like fast heartbeat, muscle cramps or muscle pain; pain, tingling, numbness in the hands or feet; seizures -unusual bleeding or bruising -unusually weak or tired Side effects that usually do not require medical attention (report to your doctor or health care professional if they continue or are bothersome): -constipation -diarrhea -headache -joint pain -loss of appetite -muscle pain -runny nose -tiredness -upset stomach This list may not describe all possible side effects. Call your doctor for medical advice about side effects. You may report side effects to FDA at 1-800-FDA-1088. Where should I keep my medicine? This medicine is only given in a clinic, doctor's office, or other health care setting and will not be stored at home. NOTE: This sheet is a summary. It may not cover all possible information. If you have questions about this medicine, talk to your doctor, pharmacist, or health care provider.  2018 [employer]/Gold Standard (2016-10-13 19:17:21)

## 2017-03-18 DIAGNOSIS — Z1231 Encounter for screening mammogram for malignant neoplasm of breast: Secondary | ICD-10-CM | POA: Diagnosis not present

## 2017-03-31 DIAGNOSIS — I2510 Atherosclerotic heart disease of native coronary artery without angina pectoris: Secondary | ICD-10-CM | POA: Diagnosis not present

## 2017-03-31 DIAGNOSIS — E890 Postprocedural hypothyroidism: Secondary | ICD-10-CM | POA: Diagnosis not present

## 2017-03-31 DIAGNOSIS — Z0000 Encounter for general adult medical examination without abnormal findings: Secondary | ICD-10-CM | POA: Diagnosis not present

## 2017-03-31 DIAGNOSIS — E871 Hypo-osmolality and hyponatremia: Secondary | ICD-10-CM | POA: Diagnosis not present

## 2017-04-28 DIAGNOSIS — H353212 Exudative age-related macular degeneration, right eye, with inactive choroidal neovascularization: Secondary | ICD-10-CM | POA: Diagnosis not present

## 2017-04-28 DIAGNOSIS — H35363 Drusen (degenerative) of macula, bilateral: Secondary | ICD-10-CM | POA: Diagnosis not present

## 2017-04-28 DIAGNOSIS — H353131 Nonexudative age-related macular degeneration, bilateral, early dry stage: Secondary | ICD-10-CM | POA: Diagnosis not present

## 2017-04-28 DIAGNOSIS — H31009 Unspecified chorioretinal scars, unspecified eye: Secondary | ICD-10-CM | POA: Diagnosis not present

## 2017-07-21 ENCOUNTER — Ambulatory Visit (HOSPITAL_COMMUNITY)
Admission: RE | Admit: 2017-07-21 | Discharge: 2017-07-21 | Disposition: A | Discharge status: Home / Self Care | Payer: Medicare Other | Source: Ambulatory Visit | Attending: Endocrinology | Admitting: Endocrinology

## 2017-07-21 ENCOUNTER — Encounter (HOSPITAL_COMMUNITY): Payer: Self-pay

## 2017-07-21 DIAGNOSIS — M810 Age-related osteoporosis without current pathological fracture: Secondary | ICD-10-CM | POA: Insufficient documentation

## 2017-07-21 MED ORDER — DENOSUMAB 60 MG/ML ~~LOC~~ SOLN
60.0000 mg | Freq: Once | SUBCUTANEOUS | Status: AC
  Administered 2017-07-21: 60 mg via SUBCUTANEOUS
  Filled 2017-07-21: qty 1

## 2017-07-21 NOTE — Discharge Instructions (Signed)
Call your MD to schedule your next 6 month appointment.      Prolia Denosumab injection What is this medicine? DENOSUMAB (den oh sue mab) slows bone breakdown. Prolia is used to treat osteoporosis in women after menopause and in men. Delton See is used to treat a high calcium level due to cancer and to prevent bone fractures and other bone problems caused by multiple myeloma or cancer bone metastases. Delton See is also used to treat giant cell tumor of the bone. This medicine may be used for other purposes; ask your health care provider or pharmacist if you have questions. COMMON BRAND NAME(S): Prolia, XGEVA What should I tell my health care provider before I take this medicine? They need to know if you have any of these conditions: -dental disease -having surgery or tooth extraction -infection -kidney disease -low levels of calcium or Vitamin D in the blood -malnutrition -on hemodialysis -skin conditions or sensitivity -thyroid or parathyroid disease -an unusual reaction to denosumab, other medicines, foods, dyes, or preservatives -pregnant or trying to get pregnant -breast-feeding How should I use this medicine? This medicine is for injection under the skin. It is given by a health care professional in a hospital or clinic setting. If you are getting Prolia, a special MedGuide will be given to you by the pharmacist with each prescription and refill. Be sure to read this information carefully each time. For Prolia, talk to your pediatrician regarding the use of this medicine in children. Special care may be needed. For Delton See, talk to your pediatrician regarding the use of this medicine in children. While this drug may be prescribed for children as young as 13 years for selected conditions, precautions do apply. Overdosage: If you think you have taken too much of this medicine contact a poison control center or emergency room at once. NOTE: This medicine is only for you. Do not share this  medicine with others. What if I miss a dose? It is important not to miss your dose. Call your doctor or health care professional if you are unable to keep an appointment. What may interact with this medicine? Do not take this medicine with any of the following medications: -other medicines containing denosumab This medicine may also interact with the following medications: -medicines that lower your chance of fighting infection -steroid medicines like prednisone or cortisone This list may not describe all possible interactions. Give your health care provider a list of all the medicines, herbs, non-prescription drugs, or dietary supplements you use. Also tell them if you smoke, drink alcohol, or use illegal drugs. Some items may interact with your medicine. What should I watch for while using this medicine? Visit your doctor or health care professional for regular checks on your progress. Your doctor or health care professional may order blood tests and other tests to see how you are doing. Call your doctor or health care professional for advice if you get a fever, chills or sore throat, or other symptoms of a cold or flu. Do not treat yourself. This drug may decrease your body's ability to fight infection. Try to avoid being around people who are sick. You should make sure you get enough calcium and vitamin D while you are taking this medicine, unless your doctor tells you not to. Discuss the foods you eat and the vitamins you take with your health care professional. See your dentist regularly. Brush and floss your teeth as directed. Before you have any dental work done, tell your dentist you are receiving  this medicine. Do not become pregnant while taking this medicine or for 5 months after stopping it. Talk with your doctor or health care professional about your birth control options while taking this medicine. Women should inform their doctor if they wish to become pregnant or think they might be  pregnant. There is a potential for serious side effects to an unborn child. Talk to your health care professional or pharmacist for more information. What side effects may I notice from receiving this medicine? Side effects that you should report to your doctor or health care professional as soon as possible: -allergic reactions like skin rash, itching or hives, swelling of the face, lips, or tongue -bone pain -breathing problems -dizziness -jaw pain, especially after dental work -redness, blistering, peeling of the skin -signs and symptoms of infection like fever or chills; cough; sore throat; pain or trouble passing urine -signs of low calcium like fast heartbeat, muscle cramps or muscle pain; pain, tingling, numbness in the hands or feet; seizures -unusual bleeding or bruising -unusually weak or tired Side effects that usually do not require medical attention (report to your doctor or health care professional if they continue or are bothersome): -constipation -diarrhea -headache -joint pain -loss of appetite -muscle pain -runny nose -tiredness -upset stomach This list may not describe all possible side effects. Call your doctor for medical advice about side effects. You may report side effects to FDA at 1-800-FDA-1088. Where should I keep my medicine? This medicine is only given in a clinic, doctor's office, or other health care setting and will not be stored at home. NOTE: This sheet is a summary. It may not cover all possible information. If you have questions about this medicine, talk to your doctor, pharmacist, or health care provider.  2018 Elsevier/Gold Standard (2016-10-13 19:17:21)

## 2017-09-20 DIAGNOSIS — G250 Essential tremor: Secondary | ICD-10-CM | POA: Diagnosis not present

## 2017-09-20 DIAGNOSIS — C73 Malignant neoplasm of thyroid gland: Secondary | ICD-10-CM | POA: Diagnosis not present

## 2017-09-20 DIAGNOSIS — E871 Hypo-osmolality and hyponatremia: Secondary | ICD-10-CM | POA: Diagnosis not present

## 2017-09-20 DIAGNOSIS — E890 Postprocedural hypothyroidism: Secondary | ICD-10-CM | POA: Diagnosis not present

## 2017-10-04 DIAGNOSIS — R921 Mammographic calcification found on diagnostic imaging of breast: Secondary | ICD-10-CM | POA: Diagnosis not present

## 2017-10-04 DIAGNOSIS — N6489 Other specified disorders of breast: Secondary | ICD-10-CM | POA: Diagnosis not present

## 2017-10-04 DIAGNOSIS — N6311 Unspecified lump in the right breast, upper outer quadrant: Secondary | ICD-10-CM | POA: Diagnosis not present

## 2017-10-06 ENCOUNTER — Other Ambulatory Visit: Payer: Self-pay | Admitting: Cardiology

## 2017-11-09 ENCOUNTER — Telehealth: Payer: Self-pay | Admitting: Cardiology

## 2017-11-09 NOTE — Telephone Encounter (Signed)
New message   1. What dental office are you calling from?  Dr Wallace Going  2. What is your office phone and fax number? Fax # [phone], Phone [phone] Gwen  3. What type of procedure is the patient having performed? Extraction   4. What date is procedure scheduled or is the patient there now? 11/11/17  5. What is your question (ex. Antibiotics prior to procedure, holding medication-we need to know how long dentist wants pt to hold med)? Plavix

## 2017-11-10 ENCOUNTER — Encounter: Payer: Self-pay | Admitting: Cardiology

## 2017-11-10 NOTE — Telephone Encounter (Signed)
Please address as soon as possible, patient needs emergency extraction.

## 2017-11-10 NOTE — Telephone Encounter (Signed)
OK to hold the Plavix.  She needs follow up with me as it is about one year.  It is likely that I will not restart the Plavix.

## 2017-11-10 NOTE — Telephone Encounter (Signed)
Dr Wallace Going office calling,she must have this info by 11:30 today please. If not pt's appt will have to be cancelled.

## 2017-11-11 NOTE — Telephone Encounter (Signed)
   Primary Cardiologist: Minus Breeding, MD  Chart reviewed as part of pre-operative protocol coverage. Given past medical history and time since last visit, based on ACC/AHA guidelines, [name] would be at acceptable risk for the planned procedure without further cardiovascular testing.   Case discussed with Dr. Percival Spanish. She is able to hold plavix for dental extraction.  I will route this recommendation to the requesting party via Epic fax function and remove from pre-op pool.  Please call with questions.  Tami Lin Myrical Andujo, PA 11/11/2017, 3:22 PM

## 2017-11-11 NOTE — Telephone Encounter (Signed)
Per Dr. Percival Spanish, please schedule an appt with Dr. Michelle Piper for follow up.

## 2017-11-11 NOTE — Telephone Encounter (Signed)
Patient scheduled for MD OV on Nov 16, 2017

## 2017-11-15 NOTE — Progress Notes (Signed)
HPI The patient presents for followup of her coronary artery disease.  Since I last saw her she is done well from a cardiovascular standpoint.  She has had palpitations about every 2-3 months lasting for about 5 minutes.  She will take an extra metoprolol 25 mg and her symptoms will go away.  She otherwise exercises on the treadmill 5-6 days/week for 20 minutes at a time.  She does not develop any chest discomfort, neck or arm discomfort.  She does not have any shortness of breath, PND or orthopnea.  She has no presyncope or syncope.    Allergies  Allergen Reactions  . Hydrocodone Other (See Comments)    "feels weird"  . Prednisone Nausea And Vomiting    Oral    Current Outpatient Medications  Medication Sig Dispense Refill  . Acetaminophen (TYLENOL PO) Take 500 mg by mouth every 6 (six) hours as needed (pain).     Marland Kitchen aspirin 81 MG tablet Take 81 mg by mouth daily.      Marland Kitchen atorvastatin (LIPITOR) 40 MG tablet Take 40 mg by mouth daily.      . Calcium Carbonate-Vitamin D (CALCIUM + D PO) Take 1 tablet by mouth 2 (two) times daily.     . clopidogrel (PLAVIX) 75 MG tablet Take 75 mg by mouth daily.      Marland Kitchen denosumab (PROLIA) 60 MG/ML SOLN injection Inject 60 mg into the skin every 6 (six) months. Administer in upper arm, thigh, or abdomen    . fish oil-omega-3 fatty acids 1000 MG capsule Take 1 g by mouth daily.     . isosorbide mononitrate (IMDUR) 30 MG 24 hr tablet Take 30 mg by mouth daily.      Marland Kitchen levothyroxine (SYNTHROID, LEVOTHROID) 88 MCG tablet Take 1 tablet (88 mcg total) by mouth daily before breakfast. 30 tablet 6  . losartan (COZAAR) 50 MG tablet Take 1 tablet (50 mg total) by mouth 2 (two) times daily. 180 tablet 1  . metoprolol succinate (TOPROL-XL) 100 MG 24 hr tablet Take 100 mg by mouth 2 (two) times daily. Take with or immediately following a meal.    . Multiple Vitamins-Minerals (CENTRUM SILVER 50+WOMEN) TABS Take 1 tablet by mouth daily.    . Multiple Vitamins-Minerals  (OCUVITE PRESERVISION PO) Take by mouth 2 (two) times daily.      . nitroGLYCERIN (NITROSTAT) 0.4 MG SL tablet Place 1 tablet (0.4 mg total) under the tongue every 5 (five) minutes as needed. 25 tablet 6  . Tetrahydrozoline-Zn Sulfate (EYE DROPS A/C OP) Place 1 drop into both eyes every 6 (six) hours as needed (for dry eyes).     . metoprolol tartrate (LOPRESSOR) 25 MG tablet Take 1 tablet (25 mg total) by mouth as needed. 30 tablet 6   No current facility-administered medications for this visit.     Past Medical History:  Diagnosis Date  . CAD, UNSPECIFIED SITE    2009. 99% proximal RCA stenosis followed by 80% distal stenosis. The LAD has a 100% stenosis in the midsegment. The circumflex is patent with distal occlusion. She has been managed medically. Her EF was well-preserved.  Marland Kitchen CAROTID STENOSIS   . DIVERTICULAR DISEASE   . Essential hypertension, benign   . Gallstones 2009  . Gastritis   . H/O blood clots 1984   in groin area that travels to lungs  . Hx of degenerative disc disease   . HYPERLIPIDEMIA   . MACULAR DEGENERATION   . OSTEOPOROSIS   .  Rheumatoid arthritis(714.0)   . Thyroid cancer ([place]) 2008    Past Surgical History:  Procedure Laterality Date  . ABDOMINAL HYSTERECTOMY  1984  . APPENDECTOMY  1959  . BREAST CYST EXCISION     left  . CHOLECYSTECTOMY  2009  . COLONOSCOPY    . ESOPHAGOGASTRODUODENOSCOPY    . IM NAILING FEMORAL SHAFT RETROGRADE  2010   left  . TONSILLECTOMY    . WRIST SURGERY  2004   Left    ROS:   As stated in the HPI and negative for all other systems.   PHYSICAL EXAM BP (!) 174/88 (BP Location: Left Arm, Patient Position: Sitting, Cuff Size: Large)   Pulse (!) 55   Ht 5\' 3"  (1.6 m)   Wt 136 lb 3.2 oz (61.8 kg)   BMI 24.13 kg/m   GENERAL:  Well appearing NECK:  No jugular venous distention, waveform within normal limits, carotid upstroke brisk and symmetric, no bruits, no thyromegaly LUNGS:  Clear to auscultation bilaterally CHEST:   Unremarkable HEART:  PMI not displaced or sustained,S1 and S2 within normal limits, no S3, no S4, no clicks, no rubs, no murmurs ABD:  Flat, positive bowel sounds normal in frequency in pitch, no bruits, no rebound, no guarding, no midline pulsatile mass, no hepatomegaly, no splenomegaly EXT:  2 plus pulses throughout, no edema, no cyanosis no clubbing    GENERAL:  Well appearing NECK:  No jugular venous distention, waveform within normal limits, carotid upstroke brisk and symmetric, no bruits, no thyromegaly LUNGS:  Clear to auscultation bilaterally BACK:  No CVA tenderness CHEST:  Unremarkable HEART:  PMI not displaced or sustained,S1 and S2 within normal limits, no S3, no S4, no clicks, no rubs, no murmurs ABD:  Flat, positive bowel sounds normal in frequency in pitch, no bruits, no rebound, no guarding, no midline pulsatile mass, no hepatomegaly, no splenomegaly EXT:  2 plus pulses upper and diminished DP/PT bilateral as I have a, no edema, no cyanosis no clubbing   EKG: Sinus rhythm, rate 55, axis within normal limits, intervals within normal limits, no acute ST-T wave changes.  11/16/2017   ASSESSMENT AND PLAN  SVT:   She is had rare symptomatic recurrences of this and will continue with as needed beta-blockers.   CAD: She is having no symptoms despite the high-grade disease.  She had negative treadmill test in 2016.  I do not think further testing is indicated at this point.   HTN: Her blood pressure is not controlled and I repeated and verified the readings after she been relaxed for a while.  I am going to increase her Cozaar to 50 mg twice daily.  She is going to keep a blood pressure diary.  HYPERLIPIDEMIA:  This is followed by . Reynold Bowen, MD.   No change in therapy.

## 2017-11-16 ENCOUNTER — Telehealth: Payer: Self-pay | Admitting: Cardiology

## 2017-11-16 ENCOUNTER — Ambulatory Visit: Payer: Medicare Other | Admitting: Cardiology

## 2017-11-16 ENCOUNTER — Encounter: Payer: Self-pay | Admitting: Cardiology

## 2017-11-16 VITALS — BP 174/88 | HR 55 | Ht 63.0 in | Wt 136.2 lb

## 2017-11-16 DIAGNOSIS — I10 Essential (primary) hypertension: Secondary | ICD-10-CM

## 2017-11-16 DIAGNOSIS — I471 Supraventricular tachycardia, unspecified: Secondary | ICD-10-CM

## 2017-11-16 DIAGNOSIS — I2510 Atherosclerotic heart disease of native coronary artery without angina pectoris: Secondary | ICD-10-CM | POA: Diagnosis not present

## 2017-11-16 MED ORDER — LOSARTAN POTASSIUM 50 MG PO TABS: 50.0000 mg | ORAL_TABLET | Freq: Two times a day (BID) | ORAL | 1 refills | Status: DC

## 2017-11-16 NOTE — Patient Instructions (Signed)
Medication changes: Your physician recommends that you continue on your current medications as directed. Please refer to the Current Medication list given to you today.  Increase Losartan to 50 mg twice daily.   Follow-Up: Your physician wants you to follow-up in: 6 months with Dr. Percival Spanish. You will receive a reminder letter in the mail two months in advance. If you don't receive a letter, please call our office to schedule the follow-up appointment.

## 2017-11-16 NOTE — Telephone Encounter (Signed)
New message     Patient was in office today 11/16/17 and stated that Dr Percival Spanish said that it was clear for her to have dental surgery and to old Plavix for 5 days, they can not take those instructions from the patient, it has to come from our office.

## 2017-11-16 NOTE — Addendum Note (Signed)
Addended by: Therisa Doyne on: 11/16/2017 03:55 PM   Modules accepted: Orders

## 2017-11-17 NOTE — Telephone Encounter (Signed)
See [place] with clearance directions-faxed via Epic function

## 2017-11-17 NOTE — Telephone Encounter (Signed)
Yes she can hold Plavix for 5 days prior to the dental procedure.

## 2017-11-17 NOTE — Telephone Encounter (Signed)
Faxed via Epic function to requesting party as requested-Dr Wallace Going Fax # [phone], Phone [phone] [facility]

## 2017-11-17 NOTE — Telephone Encounter (Signed)
New message  Pt verbalized that she is calling for the RN  Did not give reason

## 2017-11-18 DIAGNOSIS — H02834 Dermatochalasis of left upper eyelid: Secondary | ICD-10-CM | POA: Diagnosis not present

## 2017-11-18 DIAGNOSIS — H02831 Dermatochalasis of right upper eyelid: Secondary | ICD-10-CM | POA: Diagnosis not present

## 2017-11-18 DIAGNOSIS — H04123 Dry eye syndrome of bilateral lacrimal glands: Secondary | ICD-10-CM | POA: Diagnosis not present

## 2017-11-18 DIAGNOSIS — H353132 Nonexudative age-related macular degeneration, bilateral, intermediate dry stage: Secondary | ICD-10-CM | POA: Diagnosis not present

## 2017-12-01 NOTE — Addendum Note (Signed)
Addended by: Therisa Doyne on: 12/01/2017 04:36 PM   Modules accepted: Orders

## 2018-01-14 ENCOUNTER — Other Ambulatory Visit: Payer: Self-pay | Admitting: *Deleted

## 2018-01-14 MED ORDER — METOPROLOL TARTRATE 25 MG PO TABS: 25.0000 mg | ORAL_TABLET | ORAL | 6 refills | Status: DC | PRN

## 2018-01-24 ENCOUNTER — Encounter (HOSPITAL_COMMUNITY): Payer: Self-pay

## 2018-01-24 ENCOUNTER — Ambulatory Visit (HOSPITAL_COMMUNITY)
Admission: RE | Admit: 2018-01-24 | Discharge: 2018-01-24 | Disposition: A | Discharge status: Home / Self Care | Payer: Medicare Other | Source: Ambulatory Visit | Attending: Endocrinology | Admitting: Endocrinology

## 2018-01-24 DIAGNOSIS — M810 Age-related osteoporosis without current pathological fracture: Secondary | ICD-10-CM | POA: Insufficient documentation

## 2018-01-24 MED ORDER — DENOSUMAB 60 MG/ML ~~LOC~~ SOLN
60.0000 mg | Freq: Once | SUBCUTANEOUS | Status: AC
  Administered 2018-01-24: 60 mg via SUBCUTANEOUS
  Filled 2018-01-24 (×2): qty 1

## 2018-01-24 NOTE — Discharge Instructions (Signed)
Denosumab injection °What is this medicine? °DENOSUMAB (den oh sue mab) slows bone breakdown. Prolia is used to treat osteoporosis in women after menopause and in men. Xgeva is used to treat a high calcium level due to cancer and to prevent bone fractures and other bone problems caused by multiple myeloma or cancer bone metastases. Xgeva is also used to treat giant cell tumor of the bone. °This medicine may be used for other purposes; ask your health care provider or pharmacist if you have questions. °COMMON BRAND NAME(S): Prolia, XGEVA °What should I tell my health care provider before I take this medicine? °They need to know if you have any of these conditions: °-dental disease °-having surgery or tooth extraction °-infection °-kidney disease °-low levels of calcium or Vitamin D in the blood °-malnutrition °-on hemodialysis °-skin conditions or sensitivity °-thyroid or parathyroid disease °-an unusual reaction to denosumab, other medicines, foods, dyes, or preservatives °-pregnant or trying to get pregnant °-breast-feeding °How should I use this medicine? °This medicine is for injection under the skin. It is given by a health care professional in a hospital or clinic setting. °If you are getting Prolia, a special MedGuide will be given to you by the pharmacist with each prescription and refill. Be sure to read this information carefully each time. °For Prolia, talk to your pediatrician regarding the use of this medicine in children. Special care may be needed. For Xgeva, talk to your pediatrician regarding the use of this medicine in children. While this drug may be prescribed for children as young as 13 years for selected conditions, precautions do apply. °Overdosage: If you think you have taken too much of this medicine contact a poison control center or emergency room at once. °NOTE: This medicine is only for you. Do not share this medicine with others. °What if I miss a dose? °It is important not to miss your  dose. Call your doctor or health care professional if you are unable to keep an appointment. °What may interact with this medicine? °Do not take this medicine with any of the following medications: °-other medicines containing denosumab °This medicine may also interact with the following medications: °-medicines that lower your chance of fighting infection °-steroid medicines like prednisone or cortisone °This list may not describe all possible interactions. Give your health care provider a list of all the medicines, herbs, non-prescription drugs, or dietary supplements you use. Also tell them if you smoke, drink alcohol, or use illegal drugs. Some items may interact with your medicine. °What should I watch for while using this medicine? °Visit your doctor or health care professional for regular checks on your progress. Your doctor or health care professional may order blood tests and other tests to see how you are doing. °Call your doctor or health care professional for advice if you get a fever, chills or sore throat, or other symptoms of a cold or flu. Do not treat yourself. This drug may decrease your body's ability to fight infection. Try to avoid being around people who are sick. °You should make sure you get enough calcium and vitamin D while you are taking this medicine, unless your doctor tells you not to. Discuss the foods you eat and the vitamins you take with your health care professional. °See your dentist regularly. Brush and floss your teeth as directed. Before you have any dental work done, tell your dentist you are receiving this medicine. °Do not become pregnant while taking this medicine or for 5 months after stopping   it. Talk with your doctor or health care professional about your birth control options while taking this medicine. Women should inform their doctor if they wish to become pregnant or think they might be pregnant. There is a potential for serious side effects to an unborn child. Talk  to your health care professional or pharmacist for more information. What side effects may I notice from receiving this medicine? Side effects that you should report to your doctor or health care professional as soon as possible: -allergic reactions like skin rash, itching or hives, swelling of the face, lips, or tongue -bone pain -breathing problems -dizziness -jaw pain, especially after dental work -redness, blistering, peeling of the skin -signs and symptoms of infection like fever or chills; cough; sore throat; pain or trouble passing urine -signs of low calcium like fast heartbeat, muscle cramps or muscle pain; pain, tingling, numbness in the hands or feet; seizures -unusual bleeding or bruising -unusually weak or tired Side effects that usually do not require medical attention (report to your doctor or health care professional if they continue or are bothersome): -constipation -diarrhea -headache -joint pain -loss of appetite -muscle pain -runny nose -tiredness -upset stomach This list may not describe all possible side effects. Call your doctor for medical advice about side effects. You may report side effects to FDA at 1-800-FDA-1088. Where should I keep my medicine? This medicine is only given in a clinic, doctor's office, or other health care setting and will not be stored at home. NOTE: This sheet is a summary. It may not cover all possible information. If you have questions about this medicine, talk to your doctor, pharmacist, or health care provider.  2018 [employer]/Gold Standard (2016-10-13 19:17:21)

## 2018-01-24 NOTE — Progress Notes (Signed)
prolia given to lt upper outer arm, SQ.  Pt has had before.  Pt tolerated well.  Pt given next appointment for October and information on Prolia.

## 2018-03-23 DIAGNOSIS — Z1231 Encounter for screening mammogram for malignant neoplasm of breast: Secondary | ICD-10-CM | POA: Diagnosis not present

## 2018-03-23 DIAGNOSIS — M069 Rheumatoid arthritis, unspecified: Secondary | ICD-10-CM | POA: Diagnosis not present

## 2018-03-23 DIAGNOSIS — C73 Malignant neoplasm of thyroid gland: Secondary | ICD-10-CM | POA: Diagnosis not present

## 2018-03-23 DIAGNOSIS — M810 Age-related osteoporosis without current pathological fracture: Secondary | ICD-10-CM | POA: Diagnosis not present

## 2018-03-23 DIAGNOSIS — I2510 Atherosclerotic heart disease of native coronary artery without angina pectoris: Secondary | ICD-10-CM | POA: Diagnosis not present

## 2018-03-23 DIAGNOSIS — E890 Postprocedural hypothyroidism: Secondary | ICD-10-CM | POA: Diagnosis not present

## 2018-03-24 ENCOUNTER — Encounter: Payer: Self-pay | Admitting: Neurology

## 2018-04-01 ENCOUNTER — Telehealth: Payer: Self-pay

## 2018-04-01 NOTE — Telephone Encounter (Signed)
-----   Message from [place], DO sent at 04/01/2018  1:05 PM EDT ----- Please ask pcp to send labs

## 2018-04-01 NOTE — Progress Notes (Signed)
Subjective:   [name] was seen in consultation in the movement disorder clinic at the request of Reynold Bowen, MD.  The evaluation is for tremor.  This patient is accompanied in the office by her spouse who supplements the history.The records that were made available to me were reviewed.  Tremor started approximately 2+ years ago and involves the bilateral UE.  It has gotten worse over the last 2 years.  Tremor is most noticeable when eating.  It comes and goes in intensity.   There is a family hx of tremor in her sister.    Affected by caffeine:  No. Affected by alcohol:  Doesn't drink EtOH Affected by stress:  Yes.   Affected by fatigue:  No. Spills soup if on spoon:  May or may not Spills glass of liquid if full:  Can if glass in the right hand Affects ADL's (tying shoes, brushing teeth, etc):  No.  Current/Previously tried tremor medications: on metoprolol and felt cannot go higher on beta blocker therapy  Current medications that may exacerbate tremor:  n/a  Outside reports reviewed: lab reports, office notes and referral letter/letters.  CT brain last done in 2015.  It was negative.   Allergies  Allergen Reactions  . Hydrocodone Other (See Comments)    "feels weird"  . Prednisone Nausea And Vomiting    Oral    Outpatient Encounter Medications as of 04/05/2018  Medication Sig  . Acetaminophen (TYLENOL PO) Take 500 mg by mouth every 6 (six) hours as needed (pain).   Marland Kitchen aspirin 81 MG tablet Take 81 mg by mouth daily.    Marland Kitchen atorvastatin (LIPITOR) 40 MG tablet Take 40 mg by mouth daily.    . Calcium Carbonate-Vitamin D (CALCIUM + D PO) Take 1 tablet by mouth 2 (two) times daily.   . clopidogrel (PLAVIX) 75 MG tablet Take 75 mg by mouth daily.    Marland Kitchen denosumab (PROLIA) 60 MG/ML SOLN injection Inject 60 mg into the skin every 6 (six) months. Administer in upper arm, thigh, or abdomen  . fish oil-omega-3 fatty acids 1000 MG capsule Take 1 g by mouth daily.   . isosorbide  mononitrate (IMDUR) 30 MG 24 hr tablet Take 30 mg by mouth daily.    Marland Kitchen levothyroxine (SYNTHROID, LEVOTHROID) 88 MCG tablet Take 1 tablet (88 mcg total) by mouth daily before breakfast.  . losartan (COZAAR) 50 MG tablet Take 1 tablet (50 mg total) by mouth 2 (two) times daily.  . metoprolol succinate (TOPROL-XL) 100 MG 24 hr tablet Take 100 mg by mouth 2 (two) times daily. Take with or immediately following a meal.  . metoprolol tartrate (LOPRESSOR) 25 MG tablet Take 1 tablet (25 mg total) by mouth as needed.  . Multiple Vitamins-Minerals (CENTRUM SILVER 50+WOMEN) TABS Take 1 tablet by mouth daily.  . Multiple Vitamins-Minerals (OCUVITE PRESERVISION PO) Take by mouth 2 (two) times daily.    . nitroGLYCERIN (NITROSTAT) 0.4 MG SL tablet Place 1 tablet (0.4 mg total) under the tongue every 5 (five) minutes as needed.  Bethann Humble Sulfate (EYE DROPS A/C OP) Place 1 drop into both eyes every 6 (six) hours as needed (for dry eyes).    No facility-administered encounter medications on file as of 04/05/2018.     Past Medical History:  Diagnosis Date  . CAD, UNSPECIFIED SITE    2009. 99% proximal RCA stenosis followed by 80% distal stenosis. The LAD has a 100% stenosis in the midsegment. The circumflex is patent with  distal occlusion. She has been managed medically. Her EF was well-preserved.  Marland Kitchen CAROTID STENOSIS   . DIVERTICULAR DISEASE   . Essential hypertension, benign   . Gallstones 2009  . Gastritis   . H/O blood clots 1984   DVT and PE  . Hx of degenerative disc disease   . HYPERLIPIDEMIA   . MACULAR DEGENERATION   . OSTEOPOROSIS   . Rheumatoid arthritis(714.0)   . Thyroid cancer ([place]) 2008    Past Surgical History:  Procedure Laterality Date  . ABDOMINAL HYSTERECTOMY  1984  . APPENDECTOMY  1959  . BREAST CYST EXCISION     left  . CATARACT EXTRACTION, BILATERAL    . CHOLECYSTECTOMY  2009  . COLONOSCOPY    . ESOPHAGOGASTRODUODENOSCOPY    . IM NAILING FEMORAL SHAFT  RETROGRADE  2010   left  . TONSILLECTOMY    . WRIST SURGERY  2004   Left    Social History   Socioeconomic History  . Marital status: Married    Spouse name: Not on file  . Number of children: 1  . Years of education: Not on file  . Highest education level: Not on file  Occupational History  . Occupation: retired    Comment: Network engineer  Social Needs  . Financial resource strain: Not on file  . Food insecurity:    Worry: Not on file    Inability: Not on file  . Transportation needs:    Medical: Not on file    Non-medical: Not on file  Tobacco Use  . Smoking status: Never Smoker  . Smokeless tobacco: Never Used  Substance and Sexual Activity  . Alcohol use: No  . Drug use: No  . Sexual activity: Not on file  Lifestyle  . Physical activity:    Days per week: Not on file    Minutes per session: Not on file  . Stress: Not on file  Relationships  . Social connections:    Talks on phone: Not on file    Gets together: Not on file    Attends religious service: Not on file    Active member of club or organization: Not on file    Attends meetings of clubs or organizations: Not on file    Relationship status: Not on file  . Intimate partner violence:    Fear of current or ex partner: Not on file    Emotionally abused: Not on file    Physically abused: Not on file    Forced sexual activity: Not on file  Other Topics Concern  . Not on file  Social History Narrative   Married, retired   1 daughter    Family Status  Relation Name Status  . Mother  Deceased  . Father  Deceased  . MGM  Deceased  . MGF  Deceased  . PGM  Deceased  . PGF  Deceased  . Mat Aunt  (Not Specified)  . Brother  Deceased  . Sister  Alive  . Daughter  Alive    Review of Systems Review of Systems  Constitutional: Negative.   HENT: Negative.   Respiratory: Positive for shortness of breath (DOE).   Cardiovascular: Negative.   Gastrointestinal: Negative.   Genitourinary: Negative.     Musculoskeletal: Negative.   Skin: Negative.   Neurological: Positive for tremors.  Endo/Heme/Allergies: Negative.   Psychiatric/Behavioral: Negative.      Objective:   VITALS:   Vitals:   04/05/18 0827 04/05/18 0846  BP: (!) 200/94 (!) 180/90  Pulse: (!) 54   SpO2: 96%   Weight: 134 lb 2 oz (60.8 kg)   Height: 5\' 3"  (1.6 m)    Gen:  Appears stated age and in NAD. HEENT:  Normocephalic, atraumatic. The mucous membranes are moist. The superficial temporal arteries are without ropiness or thenderness. Cardiovascular: brady.  Regular. Lungs: Clear to auscultation bilaterally. Neck: There are no carotid bruits noted bilaterally.  NEUROLOGICAL:  Orientation:  The patient is alert and oriented x 3.  Recent and remote memory are intact.  Attention span and concentration are normal.  Able to name objects and repeat without trouble.  Fund of knowledge is appropriate Cranial nerves: There is good facial symmetry. The pupils are equal round and reactive to light bilaterally. Fundoscopic exam reveals clear disc margins bilaterally. Extraocular muscles are intact and visual fields are full to confrontational testing. Speech is fluent and clear. Soft palate rises symmetrically and there is no tongue deviation. Hearing is intact to conversational tone. Tone: Tone is good throughout. Sensation: Sensation is intact to light touch and pinprick throughout (facial, trunk, extremities). Vibration is intact at the bilateral big toe but it is decreased. There is no extinction with double simultaneous stimulation. There is no sensory dermatomal level identified. Coordination:  The patient has no dysdiadichokinesia or dysmetria. Motor: Strength is 5/5 in the bilateral upper and lower extremities.  Shoulder shrug is equal bilaterally.  There is no pronator drift.  There are no fasciculations noted. DTR's: Deep tendon reflexes are 1/4 at the bilateral biceps, triceps, brachioradialis, patella and achilles.   Plantar responses are downgoing bilaterally. Gait and Station: The patient is able to ambulate without difficulty.   MOVEMENT EXAM: Tremor:  There is  tremor in the UE, noted most significantly with action/intention.  There is mild trouble with archimedes spirals.  There is no tremor at rest.  The patient is able to pour water from one glass to another without spilling but tremor is noted  Labs: Labs are received from the patient's primary care physician and are dated September 20, 2017.  Sodium is 133, potassium 4.2, chloride 97, CO2 26, BUN 15 and creatinine 0.8.  White blood cells are 5.8 hemoglobin 12.3, hematocrit 37.7 and platelets 227.  TSH 0.51.  All     Assessment/Plan:   1.  Essential Tremor.  -This is evidenced by the symmetrical nature and longstanding hx of gradually getting worse.  We discussed nature and pathophysiology.  We discussed that this can continue to gradually get worse with time.  We discussed that some medications can worsen this, as can caffeine use.  We discussed medication therapy as well as surgical therapy.  She is not interested in surgical therapy.  Ultimately, the patient decided to hold on doing anything for right now.  We did discuss primidone in detail, including risks, benefits and side effects.  At this point, she just decided that she did not want to take any further medications, which I think is reasonable.  She changes her mind, we can readdress this in the future.  She would like to follow-up in 1 year, sooner should new neurologic issues arise.  2.  HTN  -Improved by the end of the visit, but still certainly not normal.  She is going to go home and take her blood pressure.  If still with a systolic greater than 703, she will call her primary care physician.  She is asymptomatic.  CC:  Reynold Bowen, MD

## 2018-04-01 NOTE — Telephone Encounter (Signed)
[employer], spoke with Baker Janus. She will fax labs from 03/2018 and 09/2017

## 2018-04-05 ENCOUNTER — Ambulatory Visit: Payer: Medicare Other | Admitting: Neurology

## 2018-04-05 ENCOUNTER — Encounter: Payer: Self-pay | Admitting: Neurology

## 2018-04-05 VITALS — BP 180/90 | HR 54 | Ht 63.0 in | Wt 134.1 lb

## 2018-04-05 DIAGNOSIS — G250 Essential tremor: Secondary | ICD-10-CM | POA: Diagnosis not present

## 2018-04-05 DIAGNOSIS — I10 Essential (primary) hypertension: Secondary | ICD-10-CM

## 2018-04-06 DIAGNOSIS — H353132 Nonexudative age-related macular degeneration, bilateral, intermediate dry stage: Secondary | ICD-10-CM | POA: Diagnosis not present

## 2018-04-06 DIAGNOSIS — H43813 Vitreous degeneration, bilateral: Secondary | ICD-10-CM | POA: Diagnosis not present

## 2018-04-06 DIAGNOSIS — H02831 Dermatochalasis of right upper eyelid: Secondary | ICD-10-CM | POA: Diagnosis not present

## 2018-04-06 DIAGNOSIS — Z961 Presence of intraocular lens: Secondary | ICD-10-CM | POA: Diagnosis not present

## 2018-04-25 DIAGNOSIS — N390 Urinary tract infection, site not specified: Secondary | ICD-10-CM | POA: Diagnosis not present

## 2018-04-25 DIAGNOSIS — I10 Essential (primary) hypertension: Secondary | ICD-10-CM | POA: Diagnosis not present

## 2018-04-25 DIAGNOSIS — R109 Unspecified abdominal pain: Secondary | ICD-10-CM | POA: Diagnosis not present

## 2018-05-09 ENCOUNTER — Other Ambulatory Visit: Payer: Self-pay | Admitting: Dermatology

## 2018-05-09 DIAGNOSIS — L570 Actinic keratosis: Secondary | ICD-10-CM | POA: Diagnosis not present

## 2018-05-09 DIAGNOSIS — D229 Melanocytic nevi, unspecified: Secondary | ICD-10-CM | POA: Diagnosis not present

## 2018-05-09 DIAGNOSIS — D485 Neoplasm of uncertain behavior of skin: Secondary | ICD-10-CM | POA: Diagnosis not present

## 2018-05-09 DIAGNOSIS — L720 Epidermal cyst: Secondary | ICD-10-CM | POA: Diagnosis not present

## 2018-05-10 DIAGNOSIS — H01021 Squamous blepharitis right upper eyelid: Secondary | ICD-10-CM | POA: Diagnosis not present

## 2018-05-10 DIAGNOSIS — H01024 Squamous blepharitis left upper eyelid: Secondary | ICD-10-CM | POA: Diagnosis not present

## 2018-05-10 DIAGNOSIS — H01022 Squamous blepharitis right lower eyelid: Secondary | ICD-10-CM | POA: Diagnosis not present

## 2018-05-10 DIAGNOSIS — H10022 Other mucopurulent conjunctivitis, left eye: Secondary | ICD-10-CM | POA: Diagnosis not present

## 2018-05-11 DIAGNOSIS — H10022 Other mucopurulent conjunctivitis, left eye: Secondary | ICD-10-CM | POA: Diagnosis not present

## 2018-05-15 NOTE — Progress Notes (Signed)
HPI The patient presents for followup of her coronary artery disease.  She is done well since I last saw her.  She exercises by walking 20 minutes 5 to 6 days a week.  She rarely has palpitations and has not had to take any metoprolol.  She has some chronic shortness of breath climbing stairs but she has had no change in this.  She is not having any resting shortness of breath, PND or orthopnea.  She denies any presyncope or syncope.  She rarely has any fleeting chest discomfort.  She is going to have to have oral surgery likely as she had problems from a tooth being extracted.  Allergies  Allergen Reactions  . Hydrocodone Other (See Comments)    "feels weird"  . Prednisone Nausea And Vomiting    Oral    Current Outpatient Medications  Medication Sig Dispense Refill  . Acetaminophen (TYLENOL PO) Take 500 mg by mouth every 6 (six) hours as needed (pain).     Marland Kitchen aspirin 81 MG tablet Take 81 mg by mouth daily.      Marland Kitchen atorvastatin (LIPITOR) 40 MG tablet Take 40 mg by mouth daily.      . Calcium Carbonate-Vitamin D (CALCIUM + D PO) Take 1 tablet by mouth 2 (two) times daily.     . clopidogrel (PLAVIX) 75 MG tablet Take 75 mg by mouth daily.      Marland Kitchen denosumab (PROLIA) 60 MG/ML SOLN injection Inject 60 mg into the skin every 6 (six) months. Administer in upper arm, thigh, or abdomen    . fish oil-omega-3 fatty acids 1000 MG capsule Take 1 g by mouth daily.     . isosorbide mononitrate (IMDUR) 30 MG 24 hr tablet Take 30 mg by mouth daily.      Marland Kitchen levothyroxine (SYNTHROID, LEVOTHROID) 88 MCG tablet Take 1 tablet (88 mcg total) by mouth daily before breakfast. 30 tablet 6  . losartan (COZAAR) 50 MG tablet Take 1 tablet (50 mg total) by mouth 2 (two) times daily. 180 tablet 1  . metoprolol succinate (TOPROL-XL) 100 MG 24 hr tablet Take 100 mg by mouth 2 (two) times daily. Take with or immediately following a meal.    . moxifloxacin (VIGAMOX) 0.5 % ophthalmic solution   0  . Multiple  Vitamins-Minerals (CENTRUM SILVER 50+WOMEN) TABS Take 1 tablet by mouth daily.    . Multiple Vitamins-Minerals (OCUVITE PRESERVISION PO) Take by mouth 2 (two) times daily.      . nitroGLYCERIN (NITROSTAT) 0.4 MG SL tablet Place 1 tablet (0.4 mg total) under the tongue every 5 (five) minutes as needed. 25 tablet 6  . sulfacetamide (BLEPH-10) 10 % ophthalmic solution INT 1 GTT IN OU QID  0  . Tetrahydrozoline-Zn Sulfate (EYE DROPS A/C OP) Place 1 drop into both eyes every 6 (six) hours as needed (for dry eyes).     Marland Kitchen amLODipine (NORVASC) 2.5 MG tablet Take 1 tablet (2.5 mg total) by mouth daily. 90 tablet 3  . metoprolol tartrate (LOPRESSOR) 25 MG tablet Take 1 tablet (25 mg total) by mouth as needed. 30 tablet 6   No current facility-administered medications for this visit.     Past Medical History:  Diagnosis Date  . CAD, UNSPECIFIED SITE    2009. 99% proximal RCA stenosis followed by 80% distal stenosis. The LAD has a 100% stenosis in the midsegment. The circumflex is patent with distal occlusion. She has been managed medically. Her EF was well-preserved.  Marland Kitchen CAROTID STENOSIS   .  DIVERTICULAR DISEASE   . Essential hypertension, benign   . Gallstones 2009  . Gastritis   . H/O blood clots 1984   DVT and PE  . Hx of degenerative disc disease   . HYPERLIPIDEMIA   . MACULAR DEGENERATION   . OSTEOPOROSIS   . Rheumatoid arthritis(714.0)   . Thyroid cancer ([place]) 2008    Past Surgical History:  Procedure Laterality Date  . ABDOMINAL HYSTERECTOMY  1984  . APPENDECTOMY  1959  . BREAST CYST EXCISION     left  . CATARACT EXTRACTION, BILATERAL    . CHOLECYSTECTOMY  2009  . COLONOSCOPY    . ESOPHAGOGASTRODUODENOSCOPY    . IM NAILING FEMORAL SHAFT RETROGRADE  2010   left  . TONSILLECTOMY    . WRIST SURGERY  2004   Left    ROS:   As stated in the HPI and negative for all other systems.   PHYSICAL EXAM BP (!) 155/82 (BP Location: Left Arm, Patient Position: Sitting)   Pulse (!) 55    Ht 5\' 3"  (1.6 m)   Wt 133 lb (60.3 kg)   SpO2 99%   BMI 23.56 kg/m   GENERAL:  Well appearing NECK:  No jugular venous distention, waveform within normal limits, carotid upstroke brisk and symmetric, no bruits, no thyromegaly LUNGS:  Clear to auscultation bilaterally CHEST:  Unremarkable HEART:  PMI not displaced or sustained,S1 and S2 within normal limits, no S3, no S4, no clicks, no rubs,  m 2 out of 6 soft apical systolic murmur nonradiating, no diastolic urmurs ABD:  Flat, positive bowel sounds normal in frequency in pitch, no bruits, no rebound, no guarding, no midline pulsatile mass, no hepatomegaly, no splenomegaly EXT:  2 plus pulses throughout, no edema, no cyanosis no clubbing   EKG: NA  ASSESSMENT AND PLAN  SVT:   She is rarely having palpitations.  No change in therapy.  CAD: She is doing well with this.  No change in therapy.  She had negative stress testing in 2016.  HTN: Her blood pressure is not at target.  I will add Norvasc 2.5 mg daily.  She will keep a blood pressure diary and send the results to me.    HYPERLIPIDEMIA:   LDL was 66 with an HDL of 66.  This is followed by . Reynold Bowen, MD.   No change in therapy.

## 2018-05-17 ENCOUNTER — Ambulatory Visit: Payer: Medicare Other | Admitting: Cardiology

## 2018-05-17 ENCOUNTER — Encounter: Payer: Self-pay | Admitting: Cardiology

## 2018-05-17 VITALS — BP 155/82 | HR 55 | Ht 63.0 in | Wt 133.0 lb

## 2018-05-17 DIAGNOSIS — I471 Supraventricular tachycardia, unspecified: Secondary | ICD-10-CM

## 2018-05-17 DIAGNOSIS — I2510 Atherosclerotic heart disease of native coronary artery without angina pectoris: Secondary | ICD-10-CM

## 2018-05-17 DIAGNOSIS — I10 Essential (primary) hypertension: Secondary | ICD-10-CM | POA: Diagnosis not present

## 2018-05-17 MED ORDER — AMLODIPINE BESYLATE 2.5 MG PO TABS: 2.5000 mg | ORAL_TABLET | Freq: Every day | ORAL | 3 refills | Status: DC

## 2018-05-17 NOTE — Patient Instructions (Signed)
Medication Instructions:  START- Amlodipine 2.5 mg daily  If you need a refill on your cardiac medications before your next appointment, please call your pharmacy.  Labwork: None Ordered   Testing/Procedures: None Ordered  Follow-Up: Your physician wants you to follow-up in: 1 Year You should receive a reminder letter in the mail two months in advance. If you do not receive a letter, please call our office to schedule  your follow-up appointment [phone].    Thank you for choosing [facility] at [facility]!!

## 2018-05-19 DIAGNOSIS — H01022 Squamous blepharitis right lower eyelid: Secondary | ICD-10-CM | POA: Diagnosis not present

## 2018-05-19 DIAGNOSIS — H01021 Squamous blepharitis right upper eyelid: Secondary | ICD-10-CM | POA: Diagnosis not present

## 2018-05-19 DIAGNOSIS — H01024 Squamous blepharitis left upper eyelid: Secondary | ICD-10-CM | POA: Diagnosis not present

## 2018-05-19 DIAGNOSIS — H10022 Other mucopurulent conjunctivitis, left eye: Secondary | ICD-10-CM | POA: Diagnosis not present

## 2018-06-07 MED ORDER — AMLODIPINE BESYLATE 5 MG PO TABS: 5.0000 mg | ORAL_TABLET | Freq: Every day | ORAL | 11 refills | Status: DC

## 2018-06-23 DIAGNOSIS — Z23 Encounter for immunization: Secondary | ICD-10-CM | POA: Diagnosis not present

## 2018-07-21 MED ORDER — AMLODIPINE BESYLATE 5 MG PO TABS: 5.0000 mg | ORAL_TABLET | Freq: Every day | ORAL | 3 refills | Status: DC

## 2018-07-26 DIAGNOSIS — H353132 Nonexudative age-related macular degeneration, bilateral, intermediate dry stage: Secondary | ICD-10-CM | POA: Diagnosis not present

## 2018-07-26 DIAGNOSIS — H35352 Cystoid macular degeneration, left eye: Secondary | ICD-10-CM | POA: Diagnosis not present

## 2018-07-26 DIAGNOSIS — H01021 Squamous blepharitis right upper eyelid: Secondary | ICD-10-CM | POA: Diagnosis not present

## 2018-07-26 DIAGNOSIS — H35372 Puckering of macula, left eye: Secondary | ICD-10-CM | POA: Diagnosis not present

## 2018-07-28 ENCOUNTER — Telehealth: Payer: Self-pay | Admitting: Cardiology

## 2018-07-28 DIAGNOSIS — H35372 Puckering of macula, left eye: Secondary | ICD-10-CM | POA: Diagnosis not present

## 2018-07-28 DIAGNOSIS — H353131 Nonexudative age-related macular degeneration, bilateral, early dry stage: Secondary | ICD-10-CM | POA: Diagnosis not present

## 2018-07-28 DIAGNOSIS — H3562 Retinal hemorrhage, left eye: Secondary | ICD-10-CM | POA: Diagnosis not present

## 2018-07-28 DIAGNOSIS — H353212 Exudative age-related macular degeneration, right eye, with inactive choroidal neovascularization: Secondary | ICD-10-CM | POA: Diagnosis not present

## 2018-07-28 NOTE — Telephone Encounter (Signed)
New message      [facility] Pre-operative Risk Assessment    Request for surgical clearance:  What type of surgery is being performed? Vitrectomy with membrane peel on left eye  1. When is this surgery scheduled? 08/10/2018  2. What type of clearance is required (medical clearance vs. Pharmacy clearance to hold med vs. Both)? both  3. Are there any medications that need to be held prior to surgery and how long? Wants to hold plavix for 5 days  4. Practice name and name of physician performing surgery? [facility], Dr. Deloria Lair  5. What is your office phone number [phone]   7.   What is your office fax number [phone]  8.   Anesthesia type (None, local, MAC, general) ? Local MAC    [name] 07/28/2018, 11:58 AM  _________________________________________________________________   (provider comments below)

## 2018-07-28 NOTE — Telephone Encounter (Signed)
   Primary Cardiologist: Minus Breeding, MD  Chart reviewed and patient interviewed over the phone as part of pre-operative protocol coverage. Given past medical history and based on ACC/AHA guidelines, [name] would be at acceptable risk for the planned procedure without further cardiovascular testing.   Ok to hold Plavix 5 days pre op if needed.  I will route this recommendation to the requesting party via Epic fax function and remove from pre-op pool.  Please call with questions.  Kerin Ransom, PA-C 07/28/2018, 3:45 PM

## 2018-08-01 ENCOUNTER — Ambulatory Visit (HOSPITAL_COMMUNITY): Payer: Medicare Other

## 2018-08-10 DIAGNOSIS — H35372 Puckering of macula, left eye: Secondary | ICD-10-CM | POA: Diagnosis not present

## 2018-08-17 DIAGNOSIS — Z09 Encounter for follow-up examination after completed treatment for conditions other than malignant neoplasm: Secondary | ICD-10-CM | POA: Diagnosis not present

## 2018-08-17 DIAGNOSIS — H35372 Puckering of macula, left eye: Secondary | ICD-10-CM | POA: Diagnosis not present

## 2018-08-17 DIAGNOSIS — H3562 Retinal hemorrhage, left eye: Secondary | ICD-10-CM | POA: Diagnosis not present

## 2018-09-12 DIAGNOSIS — I10 Essential (primary) hypertension: Secondary | ICD-10-CM | POA: Diagnosis not present

## 2018-09-12 DIAGNOSIS — M810 Age-related osteoporosis without current pathological fracture: Secondary | ICD-10-CM | POA: Diagnosis not present

## 2018-09-12 DIAGNOSIS — R82998 Other abnormal findings in urine: Secondary | ICD-10-CM | POA: Diagnosis not present

## 2018-09-12 DIAGNOSIS — C73 Malignant neoplasm of thyroid gland: Secondary | ICD-10-CM | POA: Diagnosis not present

## 2018-09-13 DIAGNOSIS — C73 Malignant neoplasm of thyroid gland: Secondary | ICD-10-CM | POA: Diagnosis not present

## 2018-09-19 DIAGNOSIS — M87180 Osteonecrosis due to drugs, jaw: Secondary | ICD-10-CM | POA: Diagnosis not present

## 2018-09-19 DIAGNOSIS — M069 Rheumatoid arthritis, unspecified: Secondary | ICD-10-CM | POA: Diagnosis not present

## 2018-09-19 DIAGNOSIS — E871 Hypo-osmolality and hyponatremia: Secondary | ICD-10-CM | POA: Diagnosis not present

## 2018-09-19 DIAGNOSIS — Z0000 Encounter for general adult medical examination without abnormal findings: Secondary | ICD-10-CM | POA: Diagnosis not present

## 2018-09-20 DIAGNOSIS — H6123 Impacted cerumen, bilateral: Secondary | ICD-10-CM | POA: Diagnosis not present

## 2018-09-20 DIAGNOSIS — Z6823 Body mass index (BMI) 23.0-23.9, adult: Secondary | ICD-10-CM | POA: Diagnosis not present

## 2018-09-21 DIAGNOSIS — Z09 Encounter for follow-up examination after completed treatment for conditions other than malignant neoplasm: Secondary | ICD-10-CM | POA: Diagnosis not present

## 2018-09-21 DIAGNOSIS — H353131 Nonexudative age-related macular degeneration, bilateral, early dry stage: Secondary | ICD-10-CM | POA: Diagnosis not present

## 2018-09-21 DIAGNOSIS — H35372 Puckering of macula, left eye: Secondary | ICD-10-CM | POA: Diagnosis not present

## 2018-09-23 DIAGNOSIS — Z1212 Encounter for screening for malignant neoplasm of rectum: Secondary | ICD-10-CM | POA: Diagnosis not present

## 2018-09-23 LAB — IFOBT (OCCULT BLOOD): IFOBT: POSITIVE

## 2018-09-25 ENCOUNTER — Emergency Department (HOSPITAL_COMMUNITY): Payer: Medicare Other

## 2018-09-25 ENCOUNTER — Encounter (HOSPITAL_COMMUNITY): Payer: Self-pay | Admitting: Emergency Medicine

## 2018-09-25 ENCOUNTER — Observation Stay (HOSPITAL_COMMUNITY): Payer: Medicare Other

## 2018-09-25 ENCOUNTER — Inpatient Hospital Stay (HOSPITAL_COMMUNITY)
Admission: EM | Admit: 2018-09-25 | Discharge: 2018-09-28 | DRG: 202 | Disposition: A | Discharge status: Home / Self Care | Payer: Medicare Other | Attending: Internal Medicine | Admitting: Internal Medicine

## 2018-09-25 DIAGNOSIS — E871 Hypo-osmolality and hyponatremia: Secondary | ICD-10-CM | POA: Diagnosis present

## 2018-09-25 DIAGNOSIS — I248 Other forms of acute ischemic heart disease: Secondary | ICD-10-CM | POA: Diagnosis not present

## 2018-09-25 DIAGNOSIS — M810 Age-related osteoporosis without current pathological fracture: Secondary | ICD-10-CM | POA: Diagnosis present

## 2018-09-25 DIAGNOSIS — I739 Peripheral vascular disease, unspecified: Secondary | ICD-10-CM | POA: Diagnosis present

## 2018-09-25 DIAGNOSIS — M069 Rheumatoid arthritis, unspecified: Secondary | ICD-10-CM | POA: Diagnosis present

## 2018-09-25 DIAGNOSIS — E785 Hyperlipidemia, unspecified: Secondary | ICD-10-CM | POA: Diagnosis not present

## 2018-09-25 DIAGNOSIS — Z7982 Long term (current) use of aspirin: Secondary | ICD-10-CM

## 2018-09-25 DIAGNOSIS — Z66 Do not resuscitate: Secondary | ICD-10-CM | POA: Diagnosis present

## 2018-09-25 DIAGNOSIS — Z888 Allergy status to other drugs, medicaments and biological substances status: Secondary | ICD-10-CM

## 2018-09-25 DIAGNOSIS — I10 Essential (primary) hypertension: Secondary | ICD-10-CM | POA: Diagnosis present

## 2018-09-25 DIAGNOSIS — E039 Hypothyroidism, unspecified: Secondary | ICD-10-CM | POA: Diagnosis present

## 2018-09-25 DIAGNOSIS — J209 Acute bronchitis, unspecified: Principal | ICD-10-CM | POA: Diagnosis present

## 2018-09-25 DIAGNOSIS — R0602 Shortness of breath: Secondary | ICD-10-CM | POA: Diagnosis not present

## 2018-09-25 DIAGNOSIS — Z885 Allergy status to narcotic agent status: Secondary | ICD-10-CM | POA: Diagnosis not present

## 2018-09-25 DIAGNOSIS — I2510 Atherosclerotic heart disease of native coronary artery without angina pectoris: Secondary | ICD-10-CM | POA: Diagnosis not present

## 2018-09-25 DIAGNOSIS — R05 Cough: Secondary | ICD-10-CM | POA: Diagnosis not present

## 2018-09-25 DIAGNOSIS — Z85850 Personal history of malignant neoplasm of thyroid: Secondary | ICD-10-CM

## 2018-09-25 DIAGNOSIS — E890 Postprocedural hypothyroidism: Secondary | ICD-10-CM | POA: Diagnosis not present

## 2018-09-25 DIAGNOSIS — Z79899 Other long term (current) drug therapy: Secondary | ICD-10-CM | POA: Diagnosis not present

## 2018-09-25 DIAGNOSIS — J440 Chronic obstructive pulmonary disease with acute lower respiratory infection: Secondary | ICD-10-CM | POA: Diagnosis present

## 2018-09-25 DIAGNOSIS — Z86711 Personal history of pulmonary embolism: Secondary | ICD-10-CM

## 2018-09-25 DIAGNOSIS — R509 Fever, unspecified: Secondary | ICD-10-CM | POA: Diagnosis not present

## 2018-09-25 DIAGNOSIS — Z7902 Long term (current) use of antithrombotics/antiplatelets: Secondary | ICD-10-CM

## 2018-09-25 DIAGNOSIS — H3530 Unspecified macular degeneration: Secondary | ICD-10-CM | POA: Diagnosis not present

## 2018-09-25 DIAGNOSIS — I959 Hypotension, unspecified: Secondary | ICD-10-CM | POA: Diagnosis not present

## 2018-09-25 LAB — CBC WITH DIFFERENTIAL/PLATELET
Abs Immature Granulocytes: 0.01 10*3/uL (ref 0.00–0.07)
Basophils Absolute: 0 10*3/uL (ref 0.0–0.1)
Basophils Relative: 0 %
Eosinophils Absolute: 0 10*3/uL (ref 0.0–0.5)
Eosinophils Relative: 0 %
HCT: 38 % (ref 36.0–46.0)
HEMOGLOBIN: 12.9 g/dL (ref 12.0–15.0)
Immature Granulocytes: 0 %
LYMPHS ABS: 0.3 10*3/uL — AB (ref 0.7–4.0)
LYMPHS PCT: 9 %
MCH: 32 pg (ref 26.0–34.0)
MCHC: 33.9 g/dL (ref 30.0–36.0)
MCV: 94.3 fL (ref 80.0–100.0)
MONOS PCT: 16 %
Monocytes Absolute: 0.6 10*3/uL (ref 0.1–1.0)
Neutro Abs: 2.7 10*3/uL (ref 1.7–7.7)
Neutrophils Relative %: 75 %
Platelets: 177 10*3/uL (ref 150–400)
RBC: 4.03 MIL/uL (ref 3.87–5.11)
RDW: 12.8 % (ref 11.5–15.5)
WBC: 3.6 10*3/uL — ABNORMAL LOW (ref 4.0–10.5)
nRBC: 0 % (ref 0.0–0.2)

## 2018-09-25 LAB — COMPREHENSIVE METABOLIC PANEL
ALK PHOS: 47 U/L (ref 38–126)
ALT: 22 U/L (ref 0–44)
AST: 36 U/L (ref 15–41)
Albumin: 3.3 g/dL — ABNORMAL LOW (ref 3.5–5.0)
Anion gap: 12 (ref 5–15)
BUN: 9 mg/dL (ref 8–23)
CO2: 23 mmol/L (ref 22–32)
Calcium: 8.3 mg/dL — ABNORMAL LOW (ref 8.9–10.3)
Chloride: 89 mmol/L — ABNORMAL LOW (ref 98–111)
Creatinine, Ser: 0.81 mg/dL (ref 0.44–1.00)
GFR calc Af Amer: >60 mL/min (ref >60)
GFR calc non Af Amer: >60 mL/min (ref >60)
Glucose, Bld: 115 mg/dL — ABNORMAL HIGH (ref 70–99)
Potassium: 4.4 mmol/L (ref 3.5–5.1)
Sodium: 124 mmol/L — ABNORMAL LOW (ref 135–145)
Total Bilirubin: 0.8 mg/dL (ref 0.3–1.2)
Total Protein: 7.9 g/dL (ref 6.5–8.1)

## 2018-09-25 LAB — NA AND K (SODIUM & POTASSIUM), RAND UR
Potassium Urine: 19 mmol/L
Sodium, Ur: 108 mmol/L

## 2018-09-25 LAB — TROPONIN I
Troponin I: 0.12 ng/mL (ref <0.03)
Troponin I: 0.07 ng/mL (ref <0.03)

## 2018-09-25 LAB — BRAIN NATRIURETIC PEPTIDE: B Natriuretic Peptide: 307.5 pg/mL — ABNORMAL HIGH (ref 0.0–100.0)

## 2018-09-25 LAB — I-STAT TROPONIN, ED: Troponin i, poc: 0.07 ng/mL (ref 0.00–0.08)

## 2018-09-25 LAB — D-DIMER, QUANTITATIVE: D-Dimer, Quant: 0.72 ug/mL-FEU — ABNORMAL HIGH (ref 0.00–0.50)

## 2018-09-25 LAB — OSMOLALITY, URINE: Osmolality, Ur: 326 mOsm/kg (ref 300–900)

## 2018-09-25 MED ORDER — DOXYCYCLINE HYCLATE 100 MG PO CAPS: 100.0000 mg | ORAL_CAPSULE | Freq: Two times a day (BID) | ORAL | 0 refills | Status: DC

## 2018-09-25 MED ORDER — CALCIUM CARBONATE-VITAMIN D 500-200 MG-UNIT PO TABS
1.0000 | ORAL_TABLET | Freq: Two times a day (BID) | ORAL | Status: DC
  Administered 2018-09-25 – 2018-09-28 (×6): 1 via ORAL
  Filled 2018-09-25 (×6): qty 1

## 2018-09-25 MED ORDER — HYDRALAZINE HCL 20 MG/ML IJ SOLN: 5.0000 mg | Freq: Four times a day (QID) | INTRAMUSCULAR | Status: DC | PRN

## 2018-09-25 MED ORDER — ATORVASTATIN CALCIUM 40 MG PO TABS
40.0000 mg | ORAL_TABLET | Freq: Every day | ORAL | Status: DC
  Administered 2018-09-25 – 2018-09-27 (×3): 40 mg via ORAL
  Filled 2018-09-25 (×3): qty 1

## 2018-09-25 MED ORDER — IOPAMIDOL (ISOVUE-370) INJECTION 76%
100.0000 mL | Freq: Once | INTRAVENOUS | Status: AC | PRN
  Administered 2018-09-25: 100 mL via INTRAVENOUS

## 2018-09-25 MED ORDER — LOSARTAN POTASSIUM 50 MG PO TABS
50.0000 mg | ORAL_TABLET | Freq: Two times a day (BID) | ORAL | Status: DC
  Administered 2018-09-25 – 2018-09-28 (×6): 50 mg via ORAL
  Filled 2018-09-25 (×6): qty 1

## 2018-09-25 MED ORDER — METOPROLOL SUCCINATE ER 100 MG PO TB24
100.0000 mg | ORAL_TABLET | Freq: Two times a day (BID) | ORAL | Status: DC
  Administered 2018-09-25 – 2018-09-28 (×6): 100 mg via ORAL
  Filled 2018-09-25 (×6): qty 1

## 2018-09-25 MED ORDER — CALCIUM CITRATE-VITAMIN D 315-200 MG-UNIT PO TABS: 1.0000 | ORAL_TABLET | Freq: Two times a day (BID) | ORAL | Status: DC

## 2018-09-25 MED ORDER — OMEGA-3-ACID ETHYL ESTERS 1 G PO CAPS
1.0000 g | ORAL_CAPSULE | Freq: Every day | ORAL | Status: DC
  Administered 2018-09-26 – 2018-09-28 (×3): 1 g via ORAL
  Filled 2018-09-25 (×4): qty 1

## 2018-09-25 MED ORDER — AMLODIPINE BESYLATE 2.5 MG PO TABS
5.0000 mg | ORAL_TABLET | Freq: Every day | ORAL | Status: DC
  Administered 2018-09-26 – 2018-09-28 (×3): 5 mg via ORAL
  Filled 2018-09-25 (×3): qty 2

## 2018-09-25 MED ORDER — OMEGA-3 FATTY ACIDS 1000 MG PO CAPS: 1.0000 g | ORAL_CAPSULE | Freq: Every day | ORAL | Status: DC

## 2018-09-25 MED ORDER — NITROGLYCERIN 0.4 MG SL SUBL: 0.4000 mg | SUBLINGUAL_TABLET | SUBLINGUAL | Status: DC | PRN

## 2018-09-25 MED ORDER — CLOPIDOGREL BISULFATE 75 MG PO TABS
75.0000 mg | ORAL_TABLET | Freq: Every day | ORAL | Status: DC
  Administered 2018-09-26 – 2018-09-28 (×3): 75 mg via ORAL
  Filled 2018-09-25 (×3): qty 1

## 2018-09-25 MED ORDER — LEVOTHYROXINE SODIUM 88 MCG PO TABS
88.0000 ug | ORAL_TABLET | Freq: Every day | ORAL | Status: DC
  Administered 2018-09-26 – 2018-09-28 (×3): 88 ug via ORAL
  Filled 2018-09-25 (×3): qty 1

## 2018-09-25 MED ORDER — ISOSORBIDE MONONITRATE ER 30 MG PO TB24
30.0000 mg | ORAL_TABLET | Freq: Every day | ORAL | Status: DC
  Administered 2018-09-25 – 2018-09-28 (×4): 30 mg via ORAL
  Filled 2018-09-25 (×4): qty 1

## 2018-09-25 MED ORDER — HYDRALAZINE HCL 25 MG PO TABS
25.0000 mg | ORAL_TABLET | Freq: Four times a day (QID) | ORAL | Status: DC | PRN
  Administered 2018-09-25: 25 mg via ORAL
  Filled 2018-09-25: qty 1

## 2018-09-25 MED ORDER — HYDRALAZINE HCL 25 MG PO TABS
25.0000 mg | ORAL_TABLET | Freq: Three times a day (TID) | ORAL | Status: DC
  Administered 2018-09-25 – 2018-09-28 (×8): 25 mg via ORAL
  Filled 2018-09-25 (×8): qty 1

## 2018-09-25 MED ORDER — ASPIRIN 81 MG PO CHEW
81.0000 mg | CHEWABLE_TABLET | Freq: Every day | ORAL | Status: DC
  Administered 2018-09-26 – 2018-09-28 (×3): 81 mg via ORAL
  Filled 2018-09-25 (×3): qty 1

## 2018-09-25 MED ORDER — ADULT MULTIVITAMIN W/MINERALS CH
1.0000 | ORAL_TABLET | Freq: Every day | ORAL | Status: DC
  Administered 2018-09-25 – 2018-09-28 (×4): 1 via ORAL
  Filled 2018-09-25 (×4): qty 1

## 2018-09-25 MED ORDER — ENOXAPARIN SODIUM 40 MG/0.4ML ~~LOC~~ SOLN
40.0000 mg | SUBCUTANEOUS | Status: DC
  Administered 2018-09-25 – 2018-09-27 (×3): 40 mg via SUBCUTANEOUS
  Filled 2018-09-25 (×3): qty 0.4

## 2018-09-25 NOTE — ED Triage Notes (Addendum)
Pt arrives via [facility] from home with c/o sob and chest tightness x1 week. Pt a/ox4, resp e/u, nad. Rhonchi noted upon auscultation per EMS

## 2018-09-25 NOTE — Progress Notes (Signed)
CRITICAL VALUE ALERT  Critical Value: Troponin 0.12  Date & Time Notied:  11/26/2017 at [facility] Provider Notified: paged MD

## 2018-09-25 NOTE — ED Notes (Signed)
Pt ambulated to the bathroom with one staff assist  

## 2018-09-25 NOTE — ED Notes (Signed)
ED Provider at bedside. 

## 2018-09-25 NOTE — H&P (Signed)
History and Physical    [name] DOB: 06/26/1936 DOA: 09/25/2018  PCP: Reynold Bowen, MD   Patient coming from: Home  Chief Complaint: Shortness of breath  HPI: [name] is a 82 y.o. adult with medical history significant for CAD, carotid artery stenosis, diverticulosis, hypertension, osteoporosis/RA, history of PE in 1980s after surgery who comes to the ER for evaluation of shortness of breath cough chest pain going on for 2 -3 days. Patient reports having "bad cold" for 2 to 3 days.  She has been having chest congestion/fullness without overt chest pain, shortness of breath, orthopnea, also sometimes gets up during the night to catch her breath.   She denies any nausea, vomiting, abdominal pain, diarrhea/hematochezia/melena.  ED Course: Blood pressure poorly controlled in the 811B to 147 systolic, saturating 98 to 99% on room air.  Routine blood work showed hyponatremia of 124, BNP 307, troponin 0 0.07, d-dimer marginally elevated 0.72.  X-ray with mild cardiomegaly, no acute cardiopulmonary process he can.  EKG personally reviewed showing normal sinus rhythm without any overt signs of ischemia.  Review of Systems: All systems were reviewed and were negative except as mentioned in HPI above.   Past Medical History:  Diagnosis Date  . CAD, UNSPECIFIED SITE    2009. 99% proximal RCA stenosis followed by 80% distal stenosis. The LAD has a 100% stenosis in the midsegment. The circumflex is patent with distal occlusion. She has been managed medically. Her EF was well-preserved.  Marland Kitchen CAROTID STENOSIS   . DIVERTICULAR DISEASE   . Essential hypertension, benign   . Gallstones 2009  . Gastritis   . H/O blood clots 1984   DVT and PE  . Hx of degenerative disc disease   . HYPERLIPIDEMIA   . MACULAR DEGENERATION   . OSTEOPOROSIS   . Rheumatoid arthritis(714.0)   . Thyroid cancer ([place]) 2008    Past Surgical History:  Procedure Laterality Date  . ABDOMINAL  HYSTERECTOMY  1984  . APPENDECTOMY  1959  . BREAST CYST EXCISION     left  . CATARACT EXTRACTION, BILATERAL    . CHOLECYSTECTOMY  2009  . COLONOSCOPY    . ESOPHAGOGASTRODUODENOSCOPY    . IM NAILING FEMORAL SHAFT RETROGRADE  2010   left  . TONSILLECTOMY    . WRIST SURGERY  2004   Left     reports that he has never smoked. He has never used smokeless tobacco. He reports that he does not drink alcohol or use drugs.  Allergies  Allergen Reactions  . Hydrocodone Other (See Comments)    "feels weird"  . Prednisone Nausea And Vomiting    Oral    Family History  Problem Relation Age of Onset  . Heart attack Mother   . Clotting disorder Mother   . Heart disease Father   . Diabetes Father   . Ovarian cancer Maternal Aunt   . Lung cancer Brother   . CAD Sister      Prior to Admission medications   Medication Sig Start Date End Date Taking? Authorizing Provider  Acetaminophen (TYLENOL PO) Take 500 mg by mouth every 6 (six) hours as needed (pain).    Yes [provider]  amLODipine (NORVASC) 5 MG tablet Take 1 tablet (5 mg total) by mouth daily. 07/21/18 10/19/18 Yes Minus Breeding, MD  aspirin 81 MG tablet Take 81 mg by mouth daily.     Yes [provider]  atorvastatin (LIPITOR) 40 MG tablet Take 40 mg by  mouth daily.     Yes [provider]  Calcium Carbonate-Vitamin D (CALCIUM + D PO) Take 1 tablet by mouth 2 (two) times daily.    Yes [provider]  clopidogrel (PLAVIX) 75 MG tablet Take 75 mg by mouth daily.     Yes [provider]  fish oil-omega-3 fatty acids 1000 MG capsule Take 1 g by mouth daily.    Yes [provider]  isosorbide mononitrate (IMDUR) 30 MG 24 hr tablet Take 30 mg by mouth daily.     Yes [provider]  levothyroxine (SYNTHROID, LEVOTHROID) 88 MCG tablet Take 1 tablet (88 mcg total) by mouth daily before breakfast. 11/02/13  Yes Reynold Bowen, MD  losartan (COZAAR) 50 MG tablet Take 1 tablet  (50 mg total) by mouth 2 (two) times daily. 11/16/17  Yes Minus Breeding, MD  metoprolol succinate (TOPROL-XL) 100 MG 24 hr tablet Take 100 mg by mouth 2 (two) times daily. Take with or immediately following a meal.   Yes [provider]  metoprolol tartrate (LOPRESSOR) 25 MG tablet Take 1 tablet (25 mg total) by mouth as needed. 01/14/18 09/25/18 Yes Minus Breeding, MD  Multiple Vitamins-Minerals (CENTRUM SILVER 50+WOMEN) TABS Take 1 tablet by mouth daily.   Yes [provider]  Multiple Vitamins-Minerals (OCUVITE PRESERVISION PO) Take by mouth 2 (two) times daily.     Yes [provider]  nitroGLYCERIN (NITROSTAT) 0.4 MG SL tablet Place 1 tablet (0.4 mg total) under the tongue every 5 (five) minutes as needed. 05/26/12  Yes Minus Breeding, MD  Tetrahydrozoline-Zn Sulfate (EYE DROPS A/C OP) Place 1 drop into both eyes every 6 (six) hours as needed (for dry eyes).    Yes [provider]  doxycycline (VIBRAMYCIN) 100 MG capsule Take 1 capsule (100 mg total) by mouth 2 (two) times daily for 7 days. 09/25/18 10/02/18  Lennice Sites, DO    Physical Exam: Vitals:   09/25/18 1115 09/25/18 1200 09/25/18 1215 09/25/18 1428  BP: (!) 157/69 (!) 151/60 (!) 150/66 (!) 184/67  Pulse: (!) 52 (!) 51 (!) 50 (!) 55  Resp: 17 19 16 18   Temp:    98.5 F (36.9 C)  TempSrc:    Oral  SpO2: 99% 96% 98% 98%    Constitutional: NAD, calm, comfortable Vitals:   09/25/18 1115 09/25/18 1200 09/25/18 1215 09/25/18 1428  BP: (!) 157/69 (!) 151/60 (!) 150/66 (!) 184/67  Pulse: (!) 52 (!) 51 (!) 50 (!) 55  Resp: 17 19 16 18   Temp:    98.5 F (36.9 C)  TempSrc:    Oral  SpO2: 99% 96% 98% 98%    General : Alert awake, chronically sick looking, frail.   Eyes: PERRL, lids and conjunctivae normal ENMT: Mucous membranes are moist. Posterior pharynx clear of any exudate or lesions. Normal dentition.  Neck: Normal, supple, no masses, no thyromegaly Respiratory: Bilaterally  diminished breath sounds, no crackles or wheezing.Normal respiratory effort.No accessory muscle use.  Cardiovascular: Regular rate and rhythm, no murmurs / rubs / gallops.2+ pedal pulses. No carotid bruits.  Abdomen: Soft, nontender, no masses palpated, bowel sounds present Musculoskeletal: no clubbing/cyanosis. No joint deformity upper and lower extremities. Good ROM, no contractures. Normal muscle tone.  Skin: Bilateral lower leg with chronic hyperpigmented skin changes visible blood vessel with thin skin.  No edema. Neurologic: CN 2-12 grossly intact. Sensation intact, able to move upper and lower extremities with no focal weakness Psychiatric: Normal judgment and insight. Alert and oriented x  3. Normal mood.   Foley Catheter:  Labs on Admission: I have personally reviewed following labs and imaging studies  CBC: Recent Labs  Lab 09/25/18 0915  WBC 3.6*  NEUTROABS 2.7  HGB 12.9  HCT 38.0  MCV 94.3  PLT 329   Basic Metabolic Panel: Recent Labs  Lab 09/25/18 0915  NA 124*  K 4.4  CL 89*  CO2 23  GLUCOSE 115*  BUN 9  CREATININE 0.81  CALCIUM 8.3*   GFR: CrCl cannot be calculated (Unknown ideal weight.). Liver Function Tests: Recent Labs  Lab 09/25/18 0915  AST 36  ALT 22  ALKPHOS 47  BILITOT 0.8  PROT 7.9  ALBUMIN 3.3*   No results for input(s): LIPASE, AMYLASE in the last 168 hours. No results for input(s): AMMONIA in the last 168 hours. Coagulation Profile: No results for input(s): INR, PROTIME in the last 168 hours. Cardiac Enzymes: No results for input(s): CKTOTAL, CKMB, CKMBINDEX, TROPONINI in the last 168 hours. BNP (last 3 results) No results for input(s): PROBNP in the last 8760 hours. HbA1C: No results for input(s): HGBA1C in the last 72 hours. CBG: No results for input(s): GLUCAP in the last 168 hours. Lipid Profile: No results for input(s): CHOL, HDL, LDLCALC, TRIG, CHOLHDL, LDLDIRECT in the last 72 hours. Thyroid Function Tests: No results  for input(s): TSH, T4TOTAL, FREET4, T3FREE, THYROIDAB in the last 72 hours. Anemia Panel: No results for input(s): VITAMINB12, FOLATE, FERRITIN, TIBC, IRON, RETICCTPCT in the last 72 hours. Urine analysis:    Component Value Date/Time   COLORURINE YELLOW 11/05/2013 1332   APPEARANCEUR CLEAR 11/05/2013 1332   LABSPEC 1.011 11/05/2013 1332   PHURINE 7.0 11/05/2013 1332   GLUCOSEU NEGATIVE 11/05/2013 1332   HGBUR SMALL (A) 11/05/2013 1332   BILIRUBINUR NEGATIVE 11/05/2013 1332   KETONESUR 15 (A) 11/05/2013 1332   PROTEINUR 30 (A) 11/05/2013 1332   UROBILINOGEN 0.2 11/05/2013 1332   NITRITE NEGATIVE 11/05/2013 1332   LEUKOCYTESUR NEGATIVE 11/05/2013 1332    Radiological Exams on Admission: Dg Chest 2 View  Result Date: 09/25/2018 CLINICAL DATA:  Cough 1 week which shortness-of-breath this morning and chest pressure. EXAM: CHEST - 2 VIEW COMPARISON:  11/05/2013 FINDINGS: Lungs are adequately inflated without consolidation or effusion. Mild stable cardiomegaly. Stable mild compression deformity over the region of the upper lumbar spine. IMPRESSION: No acute cardiopulmonary disease. Mild stable cardiomegaly. Electronically Signed   By: Marin Olp M.D.   On: 09/25/2018 09:18   Assessment/Plan  Shortness of breath with chest congestion, cough: unclear etiology.She does not have overt leg edema however endorses orthopnea symptoms. BNP is elevated 307, chest x-ray cardiomegaly without any PULM edema or effusion.  Will order echocardiogram, serial troponin.Patient is reluctant regarding lasix. D dimer borderline high at 0.7 and troponin came back positive at 0.12. She does have history of PE in the past.  I discussed with the patient about doing further work-up including CT scan or VQ scan patient prefers to have CT Scan (explained minimal risk of nephropathy, but her creat clearance is fairly normal)  Hyponatremia at 124.  Patient and patient husband at the bedside reports patient has had low  sodium for a while, last blood work in the system was 4 years ago with sodium as low as 129 however patient has had recent blood work-up done which is outside of the epic and husband states it runs "low".  Check urine sodium/osmole and repeat BMP in the morning.  Essential hypertension, benign: Pressure poorly controlled, add  PRN BP meds,resume her home losartan amlodipine metoprolol with holding parameters.  Noted bradycardia in 50s.  CAD- severe LAD and RCA disease with collaterals from CFX Sept 2009 on Medical Rx: Given her symptoms check serial troponin, echocardiogram as #1.  Hypothyroidism: Continue Synthroid.  Check TSH.  Elevated troponin, troponin came back 0.12: I have consulted cardiology and discussed with on-call cardiology/fellow. Cont to trend troponin.  Continue her aspirin, Plavix, beta-blocker and statin.  Severity of Illness: The appropriate patient status for this patient is OBSERVATION. Observation status is judged to be reasonable and necessary in order to provide the required intensity of service to ensure the patient's safety. The patient's presenting symptoms, physical exam findings, and initial radiographic and laboratory data in the context of their medical condition is felt to place them at decreased risk for further clinical deterioration. Furthermore, it is anticipated that the patient will be medically stable for discharge from the hospital within 2 midnights of admission.    DVT prophylaxis: Lovenox Code Status: DNR per patient Family Communication: Admission, patients condition and plan of care including tests being ordered have been discussed with the patient and husband, who indicate understanding and agree with the plan and Code Status.  Consults called:  Antonieta Pert MD [facility] Pager [phone]  If 7PM-7AM, please contact night-coverage [email or web address] Password [facility]  09/25/2018, 2:45 PM

## 2018-09-25 NOTE — ED Notes (Signed)
RN ambulated the patient in the hall with no problems or complaints.

## 2018-09-25 NOTE — ED Notes (Signed)
Pt ambulated in the hallway. Pt's O2 was between 96%-100%. Pt's O2 primarily at 99%. Pt's HR was 66-67. Pt's BP was elevated, after ambulation (209/74). Informed Chelsea - RN.

## 2018-09-25 NOTE — ED Notes (Signed)
Family at bedside. 

## 2018-09-25 NOTE — ED Notes (Signed)
Lab called stating that cbc was clotted and needed to be redrawn

## 2018-09-25 NOTE — Discharge Instructions (Signed)
Follow-up with your cardiologist.  Return to the ED if symptoms worsen.  Fill antibiotics if not feeling better after 2 days.  But please return to the ED if symptoms worsen.

## 2018-09-25 NOTE — ED Notes (Signed)
Admitting MD at bedside.

## 2018-09-25 NOTE — ED Provider Notes (Addendum)
[place] EMERGENCY DEPARTMENT Provider Note   CSN: [id number] Arrival date & time: 09/25/18  3474     History   Chief Complaint Chief Complaint  Patient presents with  . Shortness of Breath    HPI [name] is a 82 y.o. adult.  The history is provided by the patient.  Shortness of Breath  This is a new problem. The problem occurs continuously.The current episode started more than 2 days ago. The problem has been gradually worsening. Associated symptoms include cough and chest pain. Pertinent negatives include no fever, no headaches, no coryza, no rhinorrhea, no sore throat, no swollen glands, no ear pain, no neck pain, no sputum production, no orthopnea, no vomiting, no abdominal pain, no rash and no leg swelling. It is unknown what precipitated the problem. He has tried nothing for the symptoms. The treatment provided no relief. He has had prior ED visits. Associated medical issues include PE (1980s after surgery) and CAD. Associated medical issues do not include asthma, COPD, heart failure or recent surgery.    Past Medical History:  Diagnosis Date  . CAD, UNSPECIFIED SITE    2009. 99% proximal RCA stenosis followed by 80% distal stenosis. The LAD has a 100% stenosis in the midsegment. The circumflex is patent with distal occlusion. She has been managed medically. Her EF was well-preserved.  Marland Kitchen CAROTID STENOSIS   . DIVERTICULAR DISEASE   . Essential hypertension, benign   . Gallstones 2009  . Gastritis   . H/O blood clots 1984   DVT and PE  . Hx of degenerative disc disease   . HYPERLIPIDEMIA   . MACULAR DEGENERATION   . OSTEOPOROSIS   . Rheumatoid arthritis(714.0)   . Thyroid cancer [facility]) 2008    Patient Active Problem List   Diagnosis Date Noted  . Shortness of breath 09/25/2018  . Hypothyroidism 10/30/2013  . TIA (transient ischemic attack)- remote 10/30/2013  . Leukopenia 10/29/2013  . Elevated troponin- 0.58 in setting of PSVT  10/29/2013  . Community acquired pneumonia 10/28/2013  . Dyspnea 10/28/2013  . PSVT (paroxysmal supraventricular tachycardia) ([place]) 10/28/2013  . Personal history of pulmonary embolism- postop in the 80's 10/28/2013  . Hyponatremia 10/28/2013  . Osteoporosis   . Essential hypertension, benign 07/11/2009  . HYPERLIPIDEMIA 01/10/2009  . CAROTID STENOSIS- moderate 01/10/2009  . MACULAR DEGENERATION 01/09/2009  . CAD- severe LAD and RCA disease with collaterals from CFX Sept 2009. Medical Rx 01/09/2009  . DIVERTICULAR DISEASE 01/09/2009  . Rheumatoid arthritis(714.0) 01/09/2009  . OSTEOPOROSIS 01/09/2009    Past Surgical History:  Procedure Laterality Date  . ABDOMINAL HYSTERECTOMY  1984  . APPENDECTOMY  1959  . BREAST CYST EXCISION     left  . CATARACT EXTRACTION, BILATERAL    . CHOLECYSTECTOMY  2009  . COLONOSCOPY    . ESOPHAGOGASTRODUODENOSCOPY    . IM NAILING FEMORAL SHAFT RETROGRADE  2010   left  . TONSILLECTOMY    . WRIST SURGERY  2004   Left     OB History   No obstetric history on file.      Home Medications    Prior to Admission medications   Medication Sig Start Date End Date Taking? Authorizing Provider  Acetaminophen (TYLENOL PO) Take 500 mg by mouth every 6 (six) hours as needed (pain).    Yes [provider]  amLODipine (NORVASC) 5 MG tablet Take 1 tablet (5 mg total) by mouth daily. 07/21/18 10/19/18 Yes Minus Breeding, MD  aspirin 81 MG  tablet Take 81 mg by mouth daily.     Yes [provider]  atorvastatin (LIPITOR) 40 MG tablet Take 40 mg by mouth daily.     Yes [provider]  Calcium Carbonate-Vitamin D (CALCIUM + D PO) Take 1 tablet by mouth 2 (two) times daily.    Yes [provider]  clopidogrel (PLAVIX) 75 MG tablet Take 75 mg by mouth daily.     Yes [provider]  fish oil-omega-3 fatty acids 1000 MG capsule Take 1 g by mouth daily.    Yes [provider]  isosorbide mononitrate (IMDUR) 30  MG 24 hr tablet Take 30 mg by mouth daily.     Yes [provider]  levothyroxine (SYNTHROID, LEVOTHROID) 88 MCG tablet Take 1 tablet (88 mcg total) by mouth daily before breakfast. 11/02/13  Yes Reynold Bowen, MD  losartan (COZAAR) 50 MG tablet Take 1 tablet (50 mg total) by mouth 2 (two) times daily. 11/16/17  Yes Minus Breeding, MD  metoprolol succinate (TOPROL-XL) 100 MG 24 hr tablet Take 100 mg by mouth 2 (two) times daily. Take with or immediately following a meal.   Yes [provider]  metoprolol tartrate (LOPRESSOR) 25 MG tablet Take 1 tablet (25 mg total) by mouth as needed. 01/14/18 09/25/18 Yes Minus Breeding, MD  Multiple Vitamins-Minerals (CENTRUM SILVER 50+WOMEN) TABS Take 1 tablet by mouth daily.   Yes [provider]  Multiple Vitamins-Minerals (OCUVITE PRESERVISION PO) Take by mouth 2 (two) times daily.     Yes [provider]  nitroGLYCERIN (NITROSTAT) 0.4 MG SL tablet Place 1 tablet (0.4 mg total) under the tongue every 5 (five) minutes as needed. 05/26/12  Yes Minus Breeding, MD  Tetrahydrozoline-Zn Sulfate (EYE DROPS A/C OP) Place 1 drop into both eyes every 6 (six) hours as needed (for dry eyes).    Yes [provider]  doxycycline (VIBRAMYCIN) 100 MG capsule Take 1 capsule (100 mg total) by mouth 2 (two) times daily for 7 days. 09/25/18 10/02/18  Lennice Sites, DO    Family History Family History  Problem Relation Age of Onset  . Heart attack Mother   . Clotting disorder Mother   . Heart disease Father   . Diabetes Father   . Ovarian cancer Maternal Aunt   . Lung cancer Brother   . CAD Sister     Social History Social History   Tobacco Use  . Smoking status: Never Smoker  . Smokeless tobacco: Never Used  Substance Use Topics  . Alcohol use: No  . Drug use: No     Allergies   Hydrocodone and Prednisone   Review of Systems Review of Systems  Constitutional: Negative for chills and fever.  HENT: Negative for  ear pain, rhinorrhea and sore throat.   Eyes: Negative for pain and visual disturbance.  Respiratory: Positive for cough and shortness of breath. Negative for sputum production.   Cardiovascular: Positive for chest pain. Negative for palpitations, orthopnea and leg swelling.  Gastrointestinal: Negative for abdominal pain and vomiting.  Genitourinary: Negative for dysuria and hematuria.  Musculoskeletal: Negative for arthralgias, back pain and neck pain.  Skin: Negative for color change and rash.  Neurological: Negative for seizures, syncope and headaches.  All other systems reviewed and are negative.    Physical Exam Updated Vital Signs  ED Triage Vitals  Enc Vitals Group     BP --      Pulse Rate 09/25/18 0825 80     Resp 09/25/18  0825 12     Temp 09/25/18 0825 97.8 F (36.6 C)     Temp Source 09/25/18 0825 Oral     SpO2 --      Weight --      Height --      Head Circumference --      Peak Flow --      Pain Score 09/25/18 0826 6     Pain Loc --      Pain Edu? --      Excl. in [place]? --     Physical Exam Vitals signs and nursing note reviewed.  Constitutional:      General: He is not in acute distress.    Appearance: He is well-developed.  HENT:     Head: Normocephalic and atraumatic.     Mouth/Throat:     Mouth: Mucous membranes are moist.  Eyes:     Extraocular Movements: Extraocular movements intact.     Conjunctiva/sclera: Conjunctivae normal.     Pupils: Pupils are equal, round, and reactive to light.  Neck:     Musculoskeletal: Normal range of motion and neck supple.  Cardiovascular:     Rate and Rhythm: Normal rate and regular rhythm.     Pulses: Normal pulses. No decreased pulses.     Heart sounds: Normal heart sounds. No murmur.  Pulmonary:     Effort: Pulmonary effort is normal. No respiratory distress.     Breath sounds: Decreased breath sounds, wheezing and rhonchi present. No rales.  Abdominal:     Palpations: Abdomen is soft.     Tenderness: There  is no abdominal tenderness.  Musculoskeletal:     Right lower leg: No edema.     Left lower leg: No edema.  Skin:    General: Skin is warm and dry.     Capillary Refill: Capillary refill takes less than 2 seconds.  Neurological:     General: No focal deficit present.     Mental Status: He is alert.      ED Treatments / Results  Labs (all labs ordered are listed, but only abnormal results are displayed) Labs Reviewed  D-DIMER, QUANTITATIVE (NOT AT [facility]) - Abnormal; Notable for the following components:      Result Value   D-Dimer, Quant 0.72 (*)    All other components within normal limits  BRAIN NATRIURETIC PEPTIDE - Abnormal; Notable for the following components:   B Natriuretic Peptide 307.5 (*)    All other components within normal limits  CBC WITH DIFFERENTIAL/PLATELET - Abnormal; Notable for the following components:   WBC 3.6 (*)    Lymphs Abs 0.3 (*)    All other components within normal limits  COMPREHENSIVE METABOLIC PANEL - Abnormal; Notable for the following components:   Sodium 124 (*)    Chloride 89 (*)    Glucose, Bld 115 (*)    Calcium 8.3 (*)    Albumin 3.3 (*)    All other components within normal limits  CBC WITH DIFFERENTIAL/PLATELET  TROPONIN I  TROPONIN I  TROPONIN I  I-STAT TROPONIN, ED    EKG EKG Interpretation  Date/Time:  Sunday September 25 2018 08:27:29 EST Ventricular Rate:  62 PR Interval:    QRS Duration: 85 QT Interval:  388 QTC Calculation: 394 R Axis:   54 Text Interpretation:  Sinus rhythm Confirmed by Lennice Sites [phone]) on 09/25/2018 8:30:22 AM Also confirmed by Lennice Sites [phone]), editor Philomena Doheny [phone])  on 09/25/2018 9:20:19 AM   Radiology Dg  Chest 2 View  Result Date: 09/25/2018 CLINICAL DATA:  Cough 1 week which shortness-of-breath this morning and chest pressure. EXAM: CHEST - 2 VIEW COMPARISON:  11/05/2013 FINDINGS: Lungs are adequately inflated without consolidation or effusion. Mild stable  cardiomegaly. Stable mild compression deformity over the region of the upper lumbar spine. IMPRESSION: No acute cardiopulmonary disease. Mild stable cardiomegaly. Electronically Signed   By: Marin Olp M.D.   On: 09/25/2018 09:18    Procedures Procedures (including critical care time)  Medications Ordered in ED Medications  hydrALAZINE (APRESOLINE) tablet 25 mg (has no administration in time range)     Initial Impression / Assessment and Plan / ED Course  I have reviewed the triage vital signs and the nursing notes.  Pertinent labs & imaging results that were available during my care of the patient were reviewed by me and considered in my medical decision making (see chart for details).     ANIZA SHOR is an 82 year old female with history of CAD, high cholesterol who presents to the ED with shortness of breath.  Patient with symptoms for the last several days.  Patient has had upper respiratory symptoms with cough, mild sputum production.  Has some coarse breath sounds on exam.  No signs of obvious volume overload on exam with no JVD, no peripheral edema.  Patient states that chest pain only when she coughs.  Patient with unremarkable vitals.  No fever.  Will evaluate with labs, chest x-ray, troponin, EKG, BNP.  Patient with EKG that shows sinus rhythm.  No new ischemic changes.  Troponin within normal limits.  Chest x-ray shows no signs of pneumonia, pneumothorax, pleural effusion.  Patient with mild hyponatremia which she states is chronic.  Otherwise patient with no significant electrolyte abnormalities, kidney injury.  BNP mildly elevated.  Patient was able to ambulate without any drop in her pulse ox.  Concern for possible viral process versus mild CHF. Less likely ACS. Patient does not take a fluid pill.  No recent echocardiograms in the system.  Given concern for new heart failure, patient to be admitted to hospitalist for further work-up.  Remained hemodynamically stable  throughout my care.  This chart was dictated using voice recognition software.  Despite best efforts to proofread,  errors can occur which can change the documentation meaning.   Final Clinical Impressions(s) / ED Diagnoses   Final diagnoses:  SOB (shortness of breath)     Lennice Sites, DO 09/25/18 1317    Huberta Tompkins, DO 09/25/18 1520

## 2018-09-25 NOTE — ED Notes (Signed)
Lab called stating that patient's cmp was also clotted and needed to be redrawn. lavender and light green tubes sent to lab

## 2018-09-25 NOTE — ED Notes (Signed)
Patient transported to X-ray 

## 2018-09-25 NOTE — Consult Note (Signed)
CARDIOLOGY CONSULT NOTE   Referring Physician: Dr. Antonieta Pert Primary Physician: Dr. Forde Dandy Primary Cardiologist: Dr. Percival Spanish Reason for Consultation: cough, shortness of breath  HPI: [name] is a 82 y.o. adult w/ history of CAD (last cath 2009; medically managed), post-operative PE (not on AC), HLD, RA who presents with cough and SOB.   The patient describes symptoms of cough and shortness of breath for approximately the past 2 days.  She states that her husband has been sick lately with an upper respiratory infection and she thinks she might have caught his illness.  She denies any recent symptoms of fevers, chills, rigors.  She has had a dry cough for the past 2 days now and has gotten to the point that she is coughing frequently enough that she has developed chest pain.  She believes that the pain in her chest is only associated with coughing.  There is no exertional component to her pain.  She also describes shortness of breath with her frequent coughing as well.  She has a well-documented history of chronic exertional shortness of breath.  She does not feel as though this symptom has gotten worse lately, but rather her new worsening shortness of breath is strictly related to her coughing spells that she has been having quite frequently.   The patient was seen in the emergency department earlier today for the symptoms.  An ECG revealed no ischemic changes.  Her chest x-ray was without acute abnormality.  An ambulatory O2 sat was normal.  Her symptoms were thought to be due to an upper respiratory infection.  Her laboratory studies were however significant for hyponatremia with a sodium of 124, and elevated BNP at 307, and an initial negative troponin at 0.07 that then trended upward to 0.12.  Given her laboratory abnormalities and uncertainty as to the etiology of her presentation, she was admitted for further work-up and management.  Of note, the patient does not have any history of  recurrent hospitalization for heart failure.  She has had no recent weight gain, in fact has been losing weight over the past several months (between 5 to 10 pounds).  She has had no recent changes in her medications, her diet, or her fluid intake.  She is a never smoker  Review of Systems:     Cardiac Review of Systems: {Y] = yes [ ]  = no  Chest Pain [Y   ]  Resting SOB [   ] Exertional SOB  [Y]  Orthopnea [  ]   Pedal Edema [   ]    Palpitations [  ] Syncope  [  ]   Presyncope [   ]  General Review of Systems: [Y] = yes [  ]=no Constitional: recent weight change [Y]; anorexia [  ]; fatigue [Y]; nausea [  ]; night sweats [  ]; fever [  ]; or chills [  ];                                                                     Eyes : blurred vision [  ]; diplopia [   ]; vision changes [  ];  Amaurosis fugax[  ]; Resp: cough [Y];  wheezing[  ];  hemoptysis[  ];  PND [  ];  GI:  gallstones[  ], vomiting[  ];  dysphagia[  ]; melena[  ];  hematochezia [  ]; heartburn[  ];   GU: kidney stones [  ]; hematuria[  ];   dysuria [  ];  nocturia[  ]; incontinence [  ];             Skin: rash, swelling[  ];, hair loss[  ];  peripheral edema[  ];  or itching[  ]; Musculosketetal: myalgias[  ];  joint swelling[  ];  joint erythema[  ];  joint pain[  ];  back pain[  ];  Heme/Lymph: bruising[  ];  bleeding[  ];  anemia[  ];  Neuro: TIA[  ];  headaches[  ];  stroke[  ];  vertigo[  ];  seizures[  ];   paresthesias[  ];  difficulty walking[  ];  Psych:depression[  ]; anxiety[  ];  Endocrine: diabetes[  ];  thyroid dysfunction[Y];  Other:  Past Medical History:  Diagnosis Date  . CAD, UNSPECIFIED SITE    2009. 99% proximal RCA stenosis followed by 80% distal stenosis. The LAD has a 100% stenosis in the midsegment. The circumflex is patent with distal occlusion. She has been managed medically. Her EF was well-preserved.  Marland Kitchen CAROTID STENOSIS   . DIVERTICULAR DISEASE   . Essential hypertension, benign   . Gallstones  2009  . Gastritis   . H/O blood clots 1984   DVT and PE  . Hx of degenerative disc disease   . HYPERLIPIDEMIA   . MACULAR DEGENERATION   . OSTEOPOROSIS   . Rheumatoid arthritis(714.0)   . Thyroid cancer ([place]) 2008    Medications Prior to Admission  Medication Sig Dispense Refill  . Acetaminophen (TYLENOL PO) Take 500 mg by mouth every 6 (six) hours as needed (pain).     Marland Kitchen amLODipine (NORVASC) 5 MG tablet Take 1 tablet (5 mg total) by mouth daily. 90 tablet 3  . aspirin 81 MG tablet Take 81 mg by mouth daily.      Marland Kitchen atorvastatin (LIPITOR) 40 MG tablet Take 40 mg by mouth daily.      . Calcium Carbonate-Vitamin D (CALCIUM + D PO) Take 1 tablet by mouth 2 (two) times daily.     . clopidogrel (PLAVIX) 75 MG tablet Take 75 mg by mouth daily.      . fish oil-omega-3 fatty acids 1000 MG capsule Take 1 g by mouth daily.     . isosorbide mononitrate (IMDUR) 30 MG 24 hr tablet Take 30 mg by mouth daily.      Marland Kitchen levothyroxine (SYNTHROID, LEVOTHROID) 88 MCG tablet Take 1 tablet (88 mcg total) by mouth daily before breakfast. 30 tablet 6  . losartan (COZAAR) 50 MG tablet Take 1 tablet (50 mg total) by mouth 2 (two) times daily. 180 tablet 1  . metoprolol succinate (TOPROL-XL) 100 MG 24 hr tablet Take 100 mg by mouth 2 (two) times daily. Take with or immediately following a meal.    . metoprolol tartrate (LOPRESSOR) 25 MG tablet Take 1 tablet (25 mg total) by mouth as needed. 30 tablet 6  . Multiple Vitamins-Minerals (CENTRUM SILVER 50+WOMEN) TABS Take 1 tablet by mouth daily.    . Multiple Vitamins-Minerals (OCUVITE PRESERVISION PO) Take by mouth 2 (two) times daily.      . nitroGLYCERIN (NITROSTAT) 0.4 MG SL tablet Place 1 tablet (0.4 mg total) under the tongue every 5 (five) minutes as needed. 25 tablet 6  .  Tetrahydrozoline-Zn Sulfate (EYE DROPS A/C OP) Place 1 drop into both eyes every 6 (six) hours as needed (for dry eyes).        Derrill Memo ON 09/26/2018] amLODipine  5 mg Oral Daily  . [START  ON 09/26/2018] aspirin  81 mg Oral Daily  . atorvastatin  40 mg Oral q1800  . calcium-vitamin D  1 tablet Oral BID WC  . [START ON 09/26/2018] clopidogrel  75 mg Oral Daily  . enoxaparin (LOVENOX) injection  40 mg Subcutaneous Q24H  . hydrALAZINE  25 mg Oral Q8H  . isosorbide mononitrate  30 mg Oral Daily  . [START ON 09/26/2018] levothyroxine  88 mcg Oral QAC breakfast  . losartan  50 mg Oral BID  . metoprolol succinate  100 mg Oral BID  . multivitamin with minerals  1 tablet Oral Daily  . [START ON 09/26/2018] omega-3 acid ethyl esters  1 g Oral Daily    Infusions:   Allergies  Allergen Reactions  . Hydrocodone Other (See Comments)    "feels weird"  . Prednisone Nausea And Vomiting    Oral    Social History   Socioeconomic History  . Marital status: Married    Spouse name: Not on file  . Number of children: 1  . Years of education: Not on file  . Highest education level: Not on file  Occupational History  . Occupation: retired    Comment: Network engineer  Social Needs  . Financial resource strain: Not on file  . Food insecurity:    Worry: Not on file    Inability: Not on file  . Transportation needs:    Medical: Not on file    Non-medical: Not on file  Tobacco Use  . Smoking status: Never Smoker  . Smokeless tobacco: Never Used  Substance and Sexual Activity  . Alcohol use: No  . Drug use: No  . Sexual activity: Not on file  Lifestyle  . Physical activity:    Days per week: Not on file    Minutes per session: Not on file  . Stress: Not on file  Relationships  . Social connections:    Talks on phone: Not on file    Gets together: Not on file    Attends religious service: Not on file    Active member of club or organization: Not on file    Attends meetings of clubs or organizations: Not on file    Relationship status: Not on file  . Intimate partner violence:    Fear of current or ex partner: Not on file    Emotionally abused: Not on file    Physically  abused: Not on file    Forced sexual activity: Not on file  Other Topics Concern  . Not on file  Social History Narrative   Married, retired   1 daughter    Family History  Problem Relation Age of Onset  . Heart attack Mother   . Clotting disorder Mother   . Heart disease Father   . Diabetes Father   . Ovarian cancer Maternal Aunt   . Lung cancer Brother   . CAD Sister     PHYSICAL EXAM: Vitals:   09/25/18 1519 09/25/18 1610  BP: (!) 195/60 (!) 174/93  Pulse:  (!) 55  Resp:  18  Temp:  98.7 F (37.1 C)  SpO2:  96%     Intake/Output Summary (Last 24 hours) at 09/25/2018 1911 Last data filed at 09/25/2018 1615 Gross per 24 hour  Intake 240 ml  Output -  Net 240 ml    General:  Well appearing.  Frail.  No respiratory difficulty HEENT: normal Neck: supple.  JVP within normal limits at approximately 4 cm of water.  Cor: PMI nondisplaced. Regular rate & rhythm.  2 out of 6 early systolic murmur heard at the left upper sternal border. Lungs: clear Abdomen: soft, nontender, nondistended. No hepatosplenomegaly. No bruits or masses. Good bowel sounds. Extremities: no edema.  Scattered ecchymoses and superficial varicosities bilaterally. Neuro: alert & oriented x 3, cranial nerves grossly intact. moves all 4 extremities w/o difficulty. Affect pleasant.  ECG: Normal sinus rhythm, normal axis, normal intervals, normal morphologies, no Q waves, no ST abnormalities, plus T wave inversion in aVL but otherwise no T wave abnormalities, no suggestion of acute ischemia.  Results for orders placed or performed during the hospital encounter of 09/25/18 (from the past 24 hour(s))  D-dimer, quantitative (not at [facility])     Status: Abnormal   Collection Time: 09/25/18  8:30 AM  Result Value Ref Range   D-Dimer, Quant 0.72 (H) 0.00 - 0.50 ug/mL-FEU  I-stat troponin, ED  (not at [facility], [facility])     Status: None   Collection Time: 09/25/18  8:34 AM  Result Value Ref Range   Troponin i, poc  0.07 0.00 - 0.08 ng/mL   Comment 3          Brain natriuretic peptide     Status: Abnormal   Collection Time: 09/25/18  9:15 AM  Result Value Ref Range   B Natriuretic Peptide 307.5 (H) 0.0 - 100.0 pg/mL  CBC with Differential/Platelet     Status: Abnormal   Collection Time: 09/25/18  9:15 AM  Result Value Ref Range   WBC 3.6 (L) 4.0 - 10.5 K/uL   RBC 4.03 3.87 - 5.11 MIL/uL   Hemoglobin 12.9 12.0 - 15.0 g/dL   HCT 38.0 36.0 - 46.0 %   MCV 94.3 80.0 - 100.0 fL   MCH 32.0 26.0 - 34.0 pg   MCHC 33.9 30.0 - 36.0 g/dL   RDW 12.8 11.5 - 15.5 %   Platelets 177 150 - 400 K/uL   nRBC 0.0 0.0 - 0.2 %   Neutrophils Relative % 75 %   Neutro Abs 2.7 1.7 - 7.7 K/uL   Lymphocytes Relative 9 %   Lymphs Abs 0.3 (L) 0.7 - 4.0 K/uL   Monocytes Relative 16 %   Monocytes Absolute 0.6 0.1 - 1.0 K/uL   Eosinophils Relative 0 %   Eosinophils Absolute 0.0 0.0 - 0.5 K/uL   Basophils Relative 0 %   Basophils Absolute 0.0 0.0 - 0.1 K/uL   Immature Granulocytes 0 %   Abs Immature Granulocytes 0.01 0.00 - 0.07 K/uL  Comprehensive metabolic panel     Status: Abnormal   Collection Time: 09/25/18  9:15 AM  Result Value Ref Range   Sodium 124 (L) 135 - 145 mmol/L   Potassium 4.4 3.5 - 5.1 mmol/L   Chloride 89 (L) 98 - 111 mmol/L   CO2 23 22 - 32 mmol/L   Glucose, Bld 115 (H) 70 - 99 mg/dL   BUN 9 8 - 23 mg/dL   Creatinine, Ser 0.81 0.44 - 1.00 mg/dL   Calcium 8.3 (L) 8.9 - 10.3 mg/dL   Total Protein 7.9 6.5 - 8.1 g/dL   Albumin 3.3 (L) 3.5 - 5.0 g/dL   AST 36 15 - 41 U/L   ALT 22 0 - 44 U/L  Alkaline Phosphatase 47 38 - 126 U/L   Total Bilirubin 0.8 0.3 - 1.2 mg/dL   GFR calc non Af Amer >60 >60 mL/min   GFR calc Af Amer >60 >60 mL/min   Anion gap 12 5 - 15  Troponin I - Now Then Q6H     Status: Abnormal   Collection Time: 09/25/18  3:35 PM  Result Value Ref Range   Troponin I 0.12 (HH) <0.03 ng/mL   Dg Chest 2 View  Result Date: 09/25/2018 CLINICAL DATA:  Cough 1 week which  shortness-of-breath this morning and chest pressure. EXAM: CHEST - 2 VIEW COMPARISON:  11/05/2013 FINDINGS: Lungs are adequately inflated without consolidation or effusion. Mild stable cardiomegaly. Stable mild compression deformity over the region of the upper lumbar spine. IMPRESSION: No acute cardiopulmonary disease. Mild stable cardiomegaly. Electronically Signed   By: Marin Olp M.D.   On: 09/25/2018 09:18    ASSESSMENT: [name] is a 82 y.o. adult w/ history of CAD (last cath 2009; medically managed), post-operative PE (not on AC), HLD, RA who presents with cough and SOB.    From a heart failure standpoint, the patient is remarkably well compensated on exam and has no evidence whatsoever of volume overload.  Her most recent echocardiogram was from nearly 10 years ago, but showed a normal systolic function at that time.  Despite the fact that she was found to have an elevated BNP, there are no other physical examination or laboratory signs that would suggest that heart failure is playing a role in her current presentation.  Regarding her troponin, the patient is well known to have multivessel coronary disease with several total occlusions and high-grade distal disease.  Her last heart catheterization that I have been able to find is in May 2012.  It does not appear as though she is ever undergone PCI for her disease and has not had issues with angina (though one could describe her exertional dyspnea as an anginal equivalent).  Nonetheless, her symptoms today are not suggestive of acute coronary syndrome and are more likely attributable to type II MI in the setting of an active respiratory infection.  Unless she begins describing symptoms of more typical chest pain or worsening shortness of breath, I would favor not treating her as a type I NSTEMI at this time.  She may however benefit from outpatient stress testing at some point in time in the future.  She should follow-up with her  cardiologist, Dr. Percival Spanish, to discuss this issue.  PLAN/DISCUSSION: - infectious workup per primary team (consider obtaining flu swab, procalcitonin) - check TSH - no evidence of acute decompensated heart failure at this time - no need for initiation of diuretic therapy at this time - would not start heparin drip unless patient describes more typical symptoms of angina or worsening shortness of breath - check troponin q6h for 2 more draws to trend - continue aspirin and plavix at outpatient doses - continue Imdur, losartan, metoprolol, and amlodipine at outpatient doses - order echocardiogram - please ensure that patient sees Dr. Percival Spanish in follow up within 1 month of discharge - further workup for patient's symptoms per primary team  Marcie Mowers, MD Cardiology Fellow, PGY-6

## 2018-09-26 ENCOUNTER — Other Ambulatory Visit: Payer: Self-pay

## 2018-09-26 ENCOUNTER — Observation Stay (HOSPITAL_BASED_OUTPATIENT_CLINIC_OR_DEPARTMENT_OTHER): Payer: Medicare Other

## 2018-09-26 DIAGNOSIS — E039 Hypothyroidism, unspecified: Secondary | ICD-10-CM | POA: Diagnosis not present

## 2018-09-26 DIAGNOSIS — J440 Chronic obstructive pulmonary disease with acute lower respiratory infection: Secondary | ICD-10-CM | POA: Diagnosis present

## 2018-09-26 DIAGNOSIS — M069 Rheumatoid arthritis, unspecified: Secondary | ICD-10-CM | POA: Diagnosis present

## 2018-09-26 DIAGNOSIS — H3530 Unspecified macular degeneration: Secondary | ICD-10-CM | POA: Diagnosis present

## 2018-09-26 DIAGNOSIS — E785 Hyperlipidemia, unspecified: Secondary | ICD-10-CM | POA: Diagnosis present

## 2018-09-26 DIAGNOSIS — I10 Essential (primary) hypertension: Secondary | ICD-10-CM

## 2018-09-26 DIAGNOSIS — Z85850 Personal history of malignant neoplasm of thyroid: Secondary | ICD-10-CM | POA: Diagnosis not present

## 2018-09-26 DIAGNOSIS — Z66 Do not resuscitate: Secondary | ICD-10-CM | POA: Diagnosis present

## 2018-09-26 DIAGNOSIS — I739 Peripheral vascular disease, unspecified: Secondary | ICD-10-CM | POA: Diagnosis present

## 2018-09-26 DIAGNOSIS — E871 Hypo-osmolality and hyponatremia: Secondary | ICD-10-CM | POA: Diagnosis present

## 2018-09-26 DIAGNOSIS — M810 Age-related osteoporosis without current pathological fracture: Secondary | ICD-10-CM | POA: Diagnosis present

## 2018-09-26 DIAGNOSIS — I2510 Atherosclerotic heart disease of native coronary artery without angina pectoris: Secondary | ICD-10-CM

## 2018-09-26 DIAGNOSIS — Z885 Allergy status to narcotic agent status: Secondary | ICD-10-CM | POA: Diagnosis not present

## 2018-09-26 DIAGNOSIS — Z79899 Other long term (current) drug therapy: Secondary | ICD-10-CM | POA: Diagnosis not present

## 2018-09-26 DIAGNOSIS — R0602 Shortness of breath: Secondary | ICD-10-CM | POA: Diagnosis not present

## 2018-09-26 DIAGNOSIS — I248 Other forms of acute ischemic heart disease: Secondary | ICD-10-CM | POA: Diagnosis present

## 2018-09-26 DIAGNOSIS — E890 Postprocedural hypothyroidism: Secondary | ICD-10-CM | POA: Diagnosis present

## 2018-09-26 DIAGNOSIS — Z7982 Long term (current) use of aspirin: Secondary | ICD-10-CM | POA: Diagnosis not present

## 2018-09-26 DIAGNOSIS — J209 Acute bronchitis, unspecified: Secondary | ICD-10-CM | POA: Diagnosis present

## 2018-09-26 DIAGNOSIS — Z86711 Personal history of pulmonary embolism: Secondary | ICD-10-CM | POA: Diagnosis not present

## 2018-09-26 DIAGNOSIS — Z888 Allergy status to other drugs, medicaments and biological substances status: Secondary | ICD-10-CM | POA: Diagnosis not present

## 2018-09-26 DIAGNOSIS — Z7902 Long term (current) use of antithrombotics/antiplatelets: Secondary | ICD-10-CM | POA: Diagnosis not present

## 2018-09-26 LAB — COMPREHENSIVE METABOLIC PANEL
ALT: 22 U/L (ref 0–44)
ANION GAP: 12 (ref 5–15)
AST: 36 U/L (ref 15–41)
Albumin: 3.7 g/dL (ref 3.5–5.0)
Alkaline Phosphatase: 54 U/L (ref 38–126)
BILIRUBIN TOTAL: 0.9 mg/dL (ref 0.3–1.2)
BUN: 9 mg/dL (ref 8–23)
CO2: 24 mmol/L (ref 22–32)
Calcium: 8.3 mg/dL — ABNORMAL LOW (ref 8.9–10.3)
Chloride: 87 mmol/L — ABNORMAL LOW (ref 98–111)
Creatinine, Ser: 0.7 mg/dL (ref 0.44–1.00)
GFR calc Af Amer: >60 mL/min (ref >60)
GFR calc non Af Amer: >60 mL/min (ref >60)
GLUCOSE: 96 mg/dL (ref 70–99)
Potassium: 4.2 mmol/L (ref 3.5–5.1)
Sodium: 123 mmol/L — ABNORMAL LOW (ref 135–145)
TOTAL PROTEIN: 8.1 g/dL (ref 6.5–8.1)

## 2018-09-26 LAB — OSMOLALITY: Osmolality: 262 mOsm/kg — ABNORMAL LOW (ref 275–295)

## 2018-09-26 LAB — TROPONIN I: Troponin I: 0.05 ng/mL (ref <0.03)

## 2018-09-26 LAB — ECHOCARDIOGRAM COMPLETE
Height: 62 in
Weight: 1957.68 oz

## 2018-09-26 LAB — TSH: TSH: 0.349 u[IU]/mL — ABNORMAL LOW (ref 0.350–4.500)

## 2018-09-26 MED ORDER — SODIUM CHLORIDE 0.9 % IV BOLUS
500.0000 mL | Freq: Once | INTRAVENOUS | Status: AC
  Administered 2018-09-26: 500 mL via INTRAVENOUS

## 2018-09-26 MED ORDER — ENSURE ENLIVE PO LIQD
237.0000 mL | Freq: Two times a day (BID) | ORAL | Status: DC
  Administered 2018-09-26 – 2018-09-27 (×3): 237 mL via ORAL

## 2018-09-26 MED ORDER — FUROSEMIDE 10 MG/ML IJ SOLN
40.0000 mg | Freq: Two times a day (BID) | INTRAMUSCULAR | Status: DC
  Administered 2018-09-26 (×2): 40 mg via INTRAVENOUS
  Filled 2018-09-26 (×2): qty 4

## 2018-09-26 NOTE — Progress Notes (Signed)
Echo shows normal systolic and diastolic function. Minimal troponin elevation without s/s of CHF - mild BNP elevation. Consult note reviewed. Agree with recommendations. No further cardiac testing at this time. Follow-up with Dr. Percival Spanish after discharge.  [facility] will sign off.   Medication Recommendations:  No changes Other recommendations (labs, testing, etc):  none Follow up as an outpatient:  Dr. Ellender Hose, MD, [facility], [place] Director of the [facility] Lipid Disorders &  Card[facility] Diplomate of the American Board of Clinical Lipidology Attending Cardiologist  Direct Dial: [phone]  Fax: [phone]  Website:  [email or web address]

## 2018-09-26 NOTE — Progress Notes (Signed)
  Echocardiogram 2D Echocardiogram has been performed.  [name] 09/26/2018, 10:46 AM

## 2018-09-26 NOTE — Consult Note (Signed)
Referring Provider: No ref. provider found Primary Care Physician:  Reynold Bowen, MD Primary Nephrologist:     Reason for Consultation:   Euvolemic hyponatremia.  HPI: This is an 82 year old lady who is very pleasant.  She has a history of coronary artery disease and has had a cardiac catheterization revealing an RCA stenosis 80% distal LAD 100% stenosis in 2009 very well preserved ejection fraction.  She also has peripheral vascular disease with carotid stenosis.  No history of hypertension and hyperlipidemia.  She also had a thyroidectomy in the past for thyroid cancer.  She was admitted with shortness of breath and cough for 2 days.  There is no evidence of decompensated heart failure at time of admission.  She is recommended to continue on her aspirin and Plavix.  For hypertension she was treated with losartan, Imdur, metoprolol and amlodipine.  2D echo is actually pending at this time.  Labs sodium 123 potassium 4.2 chloride 87 BUN 9 creatinine 0.7 AST 36 ALT 22 TSH 0.34.  2D echo EF 60 to 65%.  Urine osmolality 326 urine potassium 19 and sodium 108  CT chest revealed no evidence of any pulmonary embolus with mild dilatation of the ascending aorta.  She has emphysema and atherosclerosis  Last CT scan of head was unremarkable in 2015    Past Medical History:  Diagnosis Date  . CAD, UNSPECIFIED SITE    2009. 99% proximal RCA stenosis followed by 80% distal stenosis. The LAD has a 100% stenosis in the midsegment. The circumflex is patent with distal occlusion. She has been managed medically. Her EF was well-preserved.  Marland Kitchen CAROTID STENOSIS   . DIVERTICULAR DISEASE   . Essential hypertension, benign   . Gallstones 2009  . Gastritis   . H/O blood clots 1984   DVT and PE  . Hx of degenerative disc disease   . HYPERLIPIDEMIA   . MACULAR DEGENERATION   . OSTEOPOROSIS   . Rheumatoid arthritis(714.0)   . Thyroid cancer ([place]) 2008    Past Surgical History:  Procedure Laterality Date   . ABDOMINAL HYSTERECTOMY  1984  . APPENDECTOMY  1959  . BREAST CYST EXCISION     left  . CATARACT EXTRACTION, BILATERAL    . CHOLECYSTECTOMY  2009  . COLONOSCOPY    . ESOPHAGOGASTRODUODENOSCOPY    . IM NAILING FEMORAL SHAFT RETROGRADE  2010   left  . TONSILLECTOMY    . WRIST SURGERY  2004   Left    Prior to Admission medications   Medication Sig Start Date End Date Taking? Authorizing Provider  Acetaminophen (TYLENOL PO) Take 500 mg by mouth every 6 (six) hours as needed (pain).    Yes [provider]  amLODipine (NORVASC) 5 MG tablet Take 1 tablet (5 mg total) by mouth daily. 07/21/18 10/19/18 Yes Minus Breeding, MD  aspirin 81 MG tablet Take 81 mg by mouth daily.     Yes [provider]  atorvastatin (LIPITOR) 40 MG tablet Take 40 mg by mouth daily.     Yes [provider]  Calcium Carbonate-Vitamin D (CALCIUM + D PO) Take 1 tablet by mouth 2 (two) times daily.    Yes [provider]  clopidogrel (PLAVIX) 75 MG tablet Take 75 mg by mouth daily.     Yes [provider]  fish oil-omega-3 fatty acids 1000 MG capsule Take 1 g by mouth daily.    Yes [provider]  isosorbide mononitrate (IMDUR) 30 MG 24 hr tablet Take 30 mg  by mouth daily.     Yes [provider]  levothyroxine (SYNTHROID, LEVOTHROID) 88 MCG tablet Take 1 tablet (88 mcg total) by mouth daily before breakfast. 11/02/13  Yes Reynold Bowen, MD  losartan (COZAAR) 50 MG tablet Take 1 tablet (50 mg total) by mouth 2 (two) times daily. 11/16/17  Yes Minus Breeding, MD  metoprolol succinate (TOPROL-XL) 100 MG 24 hr tablet Take 100 mg by mouth 2 (two) times daily. Take with or immediately following a meal.   Yes [provider]  metoprolol tartrate (LOPRESSOR) 25 MG tablet Take 1 tablet (25 mg total) by mouth as needed. 01/14/18 09/25/18 Yes Minus Breeding, MD  Multiple Vitamins-Minerals (CENTRUM SILVER 50+WOMEN) TABS Take 1 tablet by mouth daily.   Yes  [provider]  Multiple Vitamins-Minerals (OCUVITE PRESERVISION PO) Take by mouth 2 (two) times daily.     Yes [provider]  nitroGLYCERIN (NITROSTAT) 0.4 MG SL tablet Place 1 tablet (0.4 mg total) under the tongue every 5 (five) minutes as needed. 05/26/12  Yes Minus Breeding, MD  Tetrahydrozoline-Zn Sulfate (EYE DROPS A/C OP) Place 1 drop into both eyes every 6 (six) hours as needed (for dry eyes).    Yes [provider]  doxycycline (VIBRAMYCIN) 100 MG capsule Take 1 capsule (100 mg total) by mouth 2 (two) times daily for 7 days. 09/25/18 10/02/18  Lennice Sites, DO    Current Facility-Administered Medications  Medication Dose Route Frequency Provider Last Rate Last Dose  . amLODipine (NORVASC) tablet 5 mg  5 mg Oral Daily Kc, Ramesh, MD   5 mg at 09/26/18 1048  . aspirin chewable tablet 81 mg  81 mg Oral Daily Kc, Ramesh, MD   81 mg at 09/26/18 1048  . atorvastatin (LIPITOR) tablet 40 mg  40 mg Oral q1800 Antonieta Pert, MD   40 mg at 09/25/18 1931  . calcium-vitamin D (OSCAL WITH D) 500-200 MG-UNIT per tablet 1 tablet  1 tablet Oral BID WC Antonieta Pert, MD   1 tablet at 09/26/18 0621  . clopidogrel (PLAVIX) tablet 75 mg  75 mg Oral Daily Kc, Ramesh, MD   75 mg at 09/26/18 1048  . enoxaparin (LOVENOX) injection 40 mg  40 mg Subcutaneous Q24H Kc, Ramesh, MD   40 mg at 09/25/18 1931  . furosemide (LASIX) injection 40 mg  40 mg Intravenous BID Jonetta Osgood, MD   40 mg at 09/26/18 1056  . hydrALAZINE (APRESOLINE) injection 5 mg  5 mg Intravenous Q6H PRN Kc, Ramesh, MD      . hydrALAZINE (APRESOLINE) tablet 25 mg  25 mg Oral Q8H Kc, Ramesh, MD   25 mg at 09/26/18 2202  . isosorbide mononitrate (IMDUR) 24 hr tablet 30 mg  30 mg Oral Daily Kc, Ramesh, MD   30 mg at 09/26/18 1048  . levothyroxine (SYNTHROID, LEVOTHROID) tablet 88 mcg  88 mcg Oral QAC breakfast Antonieta Pert, MD   88 mcg at 09/26/18 0622  . losartan (COZAAR) tablet 50 mg  50 mg Oral BID Kc, Ramesh, MD    50 mg at 09/26/18 1048  . metoprolol succinate (TOPROL-XL) 24 hr tablet 100 mg  100 mg Oral BID Kc, Ramesh, MD   100 mg at 09/26/18 1048  . multivitamin with minerals tablet 1 tablet  1 tablet Oral Daily Antonieta Pert, MD   1 tablet at 09/26/18 1048  . nitroGLYCERIN (NITROSTAT) SL tablet 0.4 mg  0.4 mg Sublingual Q5 min PRN Antonieta Pert, MD      .  omega-3 acid ethyl esters (LOVAZA) capsule 1 g  1 g Oral Daily Kc, Ramesh, MD   1 g at 09/26/18 1048    Allergies as of 09/25/2018 - Review Complete 09/25/2018  Allergen Reaction Noted  . Hydrocodone Other (See Comments)   . Prednisone Nausea And Vomiting     Family History  Problem Relation Age of Onset  . Heart attack Mother   . Clotting disorder Mother   . Heart disease Father   . Diabetes Father   . Ovarian cancer Maternal Aunt   . Lung cancer Brother   . CAD Sister     Social History   Socioeconomic History  . Marital status: Married    Spouse name: Not on file  . Number of children: 1  . Years of education: Not on file  . Highest education level: Not on file  Occupational History  . Occupation: retired    Comment: Network engineer  Social Needs  . Financial resource strain: Not on file  . Food insecurity:    Worry: Not on file    Inability: Not on file  . Transportation needs:    Medical: Not on file    Non-medical: Not on file  Tobacco Use  . Smoking status: Never Smoker  . Smokeless tobacco: Never Used  Substance and Sexual Activity  . Alcohol use: No  . Drug use: No  . Sexual activity: Not on file  Lifestyle  . Physical activity:    Days per week: Not on file    Minutes per session: Not on file  . Stress: Not on file  Relationships  . Social connections:    Talks on phone: Not on file    Gets together: Not on file    Attends religious service: Not on file    Active member of club or organization: Not on file    Attends meetings of clubs or organizations: Not on file    Relationship status: Not on file  . Intimate  partner violence:    Fear of current or ex partner: Not on file    Emotionally abused: Not on file    Physically abused: Not on file    Forced sexual activity: Not on file  Other Topics Concern  . Not on file  Social History Narrative   Married, retired   1 daughter    Review of Systems: Gen: Denies any fever, chills, sweats, anorexia, fatigue, weakness, malaise, weight loss, and sleep disorder HEENT: No visual complaints, No history of Retinopathy. Normal external appearance No Epistaxis or Sore throat. No sinusitis.   CV: Denies chest pain, angina, palpitations, syncope, orthopnea, PND, peripheral edema, and claudication. Resp: Denies dyspnea at rest, dyspnea with exercise, cough, sputum, wheezing, coughing up blood, and pleurisy. GI: Denies vomiting blood, jaundice, and fecal incontinence.   Denies dysphagia or odynophagia. GU : Denies urinary burning, blood in urine, urinary frequency, urinary hesitancy, nocturnal urination, and urinary incontinence.  No renal calculi. MS: Denies joint pain, limitation of movement, and swelling, stiffness, low back pain, extremity pain. Denies muscle weakness, cramps, atrophy.  No use of non steroidal antiinflammatory drugs. Derm: Denies rash, itching, dry skin, hives, moles, warts, or unhealing ulcers.  Psych: Denies depression, anxiety, memory loss, suicidal ideation, hallucinations, paranoia, and confusion. Heme: Denies bruising, bleeding, and enlarged lymph nodes. Neuro: No headache.  No diplopia. No dysarthria.  No dysphasia.  No history of CVA.  No Seizures. No paresthesias.  No weakness. Endocrine No DM.  No Thyroid disease.  No Adrenal disease.  Physical Exam: Vital signs in last 24 hours: Temp:  [98.2 F (36.8 C)-99.1 F (37.3 C)] 98.2 F (36.8 C) (12/23 0912) Pulse Rate:  [50-75] 66 (12/23 0912) Resp:  [14-20] 16 (12/23 0912) BP: (146-195)/(47-93) 182/67 (12/23 0912) SpO2:  [95 %-98 %] 98 % (12/23 0551) Weight:  [55.5 kg-56.2 kg]  55.5 kg (12/23 0551) Last BM Date: 09/26/18 General:   Elderly debilitated lady lying in bed Head:  Normocephalic and atraumatic. Eyes:  Sclera clear, no icterus.   Conjunctiva pink. Ears:  Normal auditory acuity. Nose:  No deformity, discharge,  or lesions. Mouth:  No deformity or lesions, dentition normal. Neck:  Supple; no masses or thyromegaly. JVP not elevated Lungs:  Clear throughout to auscultation.   No wheezes, crackles, or rhonchi. No acute distress. Heart:  Regular rate and rhythm; no murmurs, clicks, rubs,  or gallops. Abdomen:  Soft, nontender and nondistended. No masses, hepatosplenomegaly or hernias noted. Normal bowel sounds, without guarding, and without rebound.   Msk:  Symmetrical without gross deformities. Normal posture. Pulses:  No carotid, renal, femoral bruits. DP and PT symmetrical and equal Extremities:  Without clubbing or edema. Neurologic:  Alert and  oriented x4;  grossly normal neurologically. Skin:  Intact without significant lesions or rashes. Cervical Nodes:  No significant cervical adenopathy. Psych:  Alert and cooperative. Normal mood and affect.  Intake/Output from previous day: 12/22 0701 - 12/23 0700 In: 600 [P.O.:600] Out: 800 [Urine:800] Intake/Output this shift: Total I/O In: 240 [P.O.:240] Out: 250 [Urine:250]  Lab Results: Recent Labs    09/25/18 0915  WBC 3.6*  HGB 12.9  HCT 38.0  PLT 177   BMET Recent Labs    09/25/18 0915 09/26/18 0457  NA 124* 123*  K 4.4 4.2  CL 89* 87*  CO2 23 24  GLUCOSE 115* 96  BUN 9 9  CREATININE 0.81 0.70  CALCIUM 8.3* 8.3*   LFT Recent Labs    09/26/18 0457  PROT 8.1  ALBUMIN 3.7  AST 36  ALT 22  ALKPHOS 54  BILITOT 0.9   PT/INR No results for input(s): LABPROT, INR in the last 72 hours. Hepatitis Panel No results for input(s): HEPBSAG, HCVAB, HEPAIGM, HEPBIGM in the last 72 hours.  Studies/Results: Dg Chest 2 View  Result Date: 09/25/2018 CLINICAL DATA:  Cough 1 week which  shortness-of-breath this morning and chest pressure. EXAM: CHEST - 2 VIEW COMPARISON:  11/05/2013 FINDINGS: Lungs are adequately inflated without consolidation or effusion. Mild stable cardiomegaly. Stable mild compression deformity over the region of the upper lumbar spine. IMPRESSION: No acute cardiopulmonary disease. Mild stable cardiomegaly. Electronically Signed   By: Marin Olp M.D.   On: 09/25/2018 09:18   Ct Angio Chest Pe W Or Wo Contrast  Result Date: 09/25/2018 CLINICAL DATA:  Cough and shortness of breath EXAM: CT ANGIOGRAPHY CHEST WITH CONTRAST TECHNIQUE: Multidetector CT imaging of the chest was performed using the standard protocol during bolus administration of intravenous contrast. Multiplanar CT image reconstructions and MIPs were obtained to evaluate the vascular anatomy. CONTRAST:  182mL ISOVUE-370 IOPAMIDOL (ISOVUE-370) INJECTION 76% COMPARISON:  Plain film from earlier in the same day, CT from 10/28/2013. FINDINGS: Cardiovascular: Thoracic aorta demonstrates significant atherosclerotic calcification. Dilatation of the ascending aorta is noted to 3.4 cm. No evidence of dissection is seen. No cardiac enlargement is noted. Diffuse coronary calcifications are seen. Pulmonary artery is well visualized within normal branching pattern. No filling defects to suggest pulmonary embolism are identified. Mediastinum/Nodes: No hilar  or mediastinal adenopathy is noted. Esophagus appears within normal limits. The thoracic inlet shows changes consistent with prior thyroidectomy. Lungs/Pleura: Lungs are well aerated bilaterally with mild emphysematous changes. Scarring is noted in the left lower lobe posteriorly. No focal infiltrate or sizable effusion is seen. No parenchymal nodules are noted. Upper Abdomen: Visualized upper abdomen demonstrates fullness of the biliary tree related to the post cholecystectomy state. No other abnormality in the upper abdomen is seen. Musculoskeletal: Degenerative changes  of the thoracic spine are noted. Bilateral breast calcifications are seen similar to that noted on prior exam. Review of the MIP images confirms the above findings. IMPRESSION: No evidence of pulmonary emboli. COPD. Mild dilatation of the ascending aorta 3.4 cm. Changes consistent with prior cholecystectomy. Aortic Atherosclerosis (ICD10-I70.0) and Emphysema (ICD10-J43.9). Electronically Signed   By: Inez Catalina M.D.   On: 09/25/2018 21:05    Assessment/Plan:  Euvolemic hyponatremia.  Urine studies more consistent with mixed picture of volume depletion, possibly syndrome of inappropriate antidiuretic hormone though the urine osmolality would not suggest this.  I believe she is more likely to have a deficiency of electrolyte and would need to increase her salt intake.  I would decrease her water intake.  I think Lasix is reasonable to lower her urine osmolality and I will bolus her with 500 cc of normal saline  LOS: 0 Sherril Croon @TODAY @11 :44 AM

## 2018-09-26 NOTE — Progress Notes (Signed)
Initial Nutrition Assessment  DOCUMENTATION CODES:   Not applicable  INTERVENTION:   -Ensure Enlive po BID, each supplement provides 350 kcal and 20 grams of protein -MVI with minerals daily  NUTRITION DIAGNOSIS:   Inadequate oral intake related to decreased appetite as evidenced by meal completion < 50%.  GOAL:   Patient will meet greater than or equal to 90% of their needs  MONITOR:   PO intake, Supplement acceptance, Labs, Weight trends, Skin, I & O's  REASON FOR ASSESSMENT:   Malnutrition Screening Tool    ASSESSMENT:   [name] is a 82 y.o. adult w/ history of CAD (last cath 2009; medically managed), post-operative PE (not on AC), HLD, RA who presents with cough and SOB.   Pt admitted with hyponatremia and SOB.   Spoke with pt and husband at bedside. Pt reports a decreased appetite with a 5-6# wt loss over th past 3 weeks, which she attributes mainly to stress. Pt and husband share that pt husband was in the hospital for heart catheterization about one month ago and pt daughter is going through a divorce, which has had a major impact on them. Due to this, pt reports eating a lot less than she should. Typical intake is 3 meals per day and one HS snacks (Breakfast: sausage biscuit; Lunch: soup; Dinner: salad; HS snack: chocolate, nuts, or crackers).   Pt reports that she has lost about 5-6# over the past one and approximately 8 pounds over the past 3 months. Reviewed wt hx, which reveals a 8% wt loss over the past 4 months, which while not significant for time frame, is concerning when coupled with poor oral intake. Pt also suspects fluid loss from diuretics may also be contributing to wt loss.   Pt estimates she consumed about 1/3 of her chef salad this afternoon. Discussed importance of good meal and supplement intake to promote healing. She has not consumed Ensure supplements PTA, but is willing to try to improve nutritional intake.   Labs reviewed: Na: 123.    NUTRITION - FOCUSED PHYSICAL EXAM:    Most Recent Value  Orbital Region  No depletion  Upper Arm Region  Mild depletion  Thoracic and Lumbar Region  No depletion  Buccal Region  No depletion  Temple Region  No depletion  Clavicle Bone Region  Mild depletion  Clavicle and Acromion Bone Region  No depletion  Scapular Bone Region  No depletion  Dorsal Hand  No depletion  Patellar Region  No depletion  Anterior Thigh Region  No depletion  Posterior Calf Region  Mild depletion  Edema (RD Assessment)  Mild  Hair  Reviewed  Eyes  Reviewed  Mouth  Reviewed  Skin  Reviewed  Nails  Reviewed       Diet Order:   Diet Order            Diet Heart Room service appropriate? Yes; Fluid consistency: Thin  Diet effective now              EDUCATION NEEDS:   Education needs have been addressed  Skin:  Skin Assessment: Reviewed RN Assessment  Last BM:  09/26/18  Height:   Ht Readings from Last 1 Encounters:  09/25/18 5\' 2"  (1.575 m)    Weight:   Wt Readings from Last 1 Encounters:  09/26/18 55.5 kg    Ideal Body Weight:  50 kg  BMI:  Body mass index is 22.38 kg/m.  Estimated Nutritional Needs:   Kcal:  1400-1600  Protein:  65-80 grams  Fluid:  1.4-1.6 L    Madissen Wyse A. Jimmye Norman, RD, LDN, CDE Pager: [phone] After hours Pager: [phone]

## 2018-09-26 NOTE — Progress Notes (Signed)
PROGRESS NOTE        PATIENT DETAILS Name: [name] Age: 82 y.o. Sex: adult Date of Birth: [birth year] Admit Date: 09/25/2018 Admitting Physician Antonieta Pert, MD [id number], Annie Main, MD  Brief Narrative: Patient is a 82 y.o. adult with history of CAD, hypertension, dyslipidemia presenting with "chest congestion"-and shortness of breath.  She was found to have hyponatremia of 124 and admitted to the hospitalist service.  Subjective: Denies any nausea, vomiting or diarrhea.  Claims her mouth has been dry for the past few days and has been "sitting" on water.  Assessment/Plan: Hyponatremia: She is not dry-she does have trace lower extremity edema (has varicose veins)-her urine osmolality is higher than her serum osmolality.  Concerned about SIADH pathophysiology-hence have started IV Lasix.  However patient very concerned that she was told by her cardiologist yesterday that she does not require IV Lasix-have gotten her a second opinion with nephrologist-we will await nephrology evaluation.  Will place on fluid restriction as well.  Although she is asymptomatic, worried that with downtrending sodium levels-she will start having symptoms soon.  URI symptoms: Complains of chest/nasal congestion-CT chest yesterday did not did not demonstrate any infiltrates.  Supportive care.  Elevated troponin: Trend is flat-not consistent with ACS.  Likely demand ischemia.  Suspect best served by medical management.  Cardiology following.  CAD: Denies any anginal symptoms-see above regarding elevated troponins.  Continue dual antiplatelets, beta-blocker and statin.  Hypertension: Blood pressure fluctuating-continue metoprolol, Imdur and losartan-adding Lasix.  Follow and adjust accordingly   Hypothyroidism: Continue Synthroid  DVT Prophylaxis: Prophylactic Lovenox   Code Status: Full code   Family Communication: None at bedside  Disposition Plan: Remain  inpatient-requires several more days of hospitalization before consideration of discharge.  Antimicrobial agents: Anti-infectives (From admission, onward)   Start     Dose/Rate Route Frequency Ordered Stop   09/25/18 0000  doxycycline (VIBRAMYCIN) 100 MG capsule     100 mg Oral 2 times daily 09/25/18 1205 10/02/18 2359      Procedures: None  CONSULTS:  cardiology and nephrology  Time spent: 25- minutes-Greater than 50% of this time was spent in counseling, explanation of diagnosis, planning of further management, and coordination of care.  MEDICATIONS: Scheduled Meds: . amLODipine  5 mg Oral Daily  . aspirin  81 mg Oral Daily  . atorvastatin  40 mg Oral q1800  . calcium-vitamin D  1 tablet Oral BID WC  . clopidogrel  75 mg Oral Daily  . enoxaparin (LOVENOX) injection  40 mg Subcutaneous Q24H  . furosemide  40 mg Intravenous BID  . hydrALAZINE  25 mg Oral Q8H  . isosorbide mononitrate  30 mg Oral Daily  . levothyroxine  88 mcg Oral QAC breakfast  . losartan  50 mg Oral BID  . metoprolol succinate  100 mg Oral BID  . multivitamin with minerals  1 tablet Oral Daily  . omega-3 acid ethyl esters  1 g Oral Daily   Continuous Infusions: . sodium chloride     PRN Meds:.hydrALAZINE, nitroGLYCERIN   PHYSICAL EXAM: Vital signs: Vitals:   09/25/18 2059 09/26/18 0029 09/26/18 0551 09/26/18 0912  BP: (!) 160/79 (!) 146/69 (!) 148/79 (!) 182/67  Pulse: 64 68 75 66  Resp: 16 14 14 16   Temp: 98.9 F (37.2 C) 99.1 F (37.3 C) 99.1 F (37.3 C) 98.2 F (36.8 C)  TempSrc: Oral Oral Oral Oral  SpO2: 95% 97% 98%   Weight:   55.5 kg   Height:       Filed Weights   09/25/18 1610 09/26/18 0551  Weight: 56.2 kg 55.5 kg   Body mass index is 22.38 kg/m.   General appearance :Awake, alert, not in any distress. Speech Clear. Eyes:Pink conjunctiva HEENT: Atraumatic and Normocephalic Neck: supple, no JVD. No cervical lymphadenopathy. No thyromegaly Resp:Good air entry  bilaterally CVS: S1 S2 regular GI: Bowel sounds present, Non tender and not distended with no gaurding, rigidity or rebound.No organomegaly Extremities: B/L Lower Ext shows + edema, both legs are warm to touch Neurology:  speech clear,Non focal, sensation is grossly intact. Psychiatric: Normal judgment and insight. Alert and oriented x 3. Normal mood. Musculoskeletal:No digital cyanosis Skin:No Rash, warm and dry Wounds:N/A  I have personally reviewed following labs and imaging studies  LABORATORY DATA: CBC: Recent Labs  Lab 09/25/18 0915  WBC 3.6*  NEUTROABS 2.7  HGB 12.9  HCT 38.0  MCV 94.3  PLT 767    Basic Metabolic Panel: Recent Labs  Lab 09/25/18 0915 09/26/18 0457  NA 124* 123*  K 4.4 4.2  CL 89* 87*  CO2 23 24  GLUCOSE 115* 96  BUN 9 9  CREATININE 0.81 0.70  CALCIUM 8.3* 8.3*    GFR: Estimated Creatinine Clearance (by C-G formula based on SCr of 0.7 mg/dL) Female: 44.4 mL/min Female: 56.9 mL/min  Liver Function Tests: Recent Labs  Lab 09/25/18 0915 09/26/18 0457  AST 36 36  ALT 22 22  ALKPHOS 47 54  BILITOT 0.8 0.9  PROT 7.9 8.1  ALBUMIN 3.3* 3.7   No results for input(s): LIPASE, AMYLASE in the last 168 hours. No results for input(s): AMMONIA in the last 168 hours.  Coagulation Profile: No results for input(s): INR, PROTIME in the last 168 hours.  Cardiac Enzymes: Recent Labs  Lab 09/25/18 1535 09/25/18 2137 09/26/18 0457  TROPONINI 0.12* 0.07* 0.05*    BNP (last 3 results) No results for input(s): PROBNP in the last 8760 hours.  HbA1C: No results for input(s): HGBA1C in the last 72 hours.  CBG: No results for input(s): GLUCAP in the last 168 hours.  Lipid Profile: No results for input(s): CHOL, HDL, LDLCALC, TRIG, CHOLHDL, LDLDIRECT in the last 72 hours.  Thyroid Function Tests: Recent Labs    09/26/18 0457  TSH 0.349*    Anemia Panel: No results for input(s): VITAMINB12, FOLATE, FERRITIN, TIBC, IRON, RETICCTPCT in  the last 72 hours.  Urine analysis:    Component Value Date/Time   COLORURINE YELLOW 11/05/2013 1332   APPEARANCEUR CLEAR 11/05/2013 1332   LABSPEC 1.011 11/05/2013 1332   PHURINE 7.0 11/05/2013 1332   GLUCOSEU NEGATIVE 11/05/2013 1332   HGBUR SMALL (A) 11/05/2013 1332   BILIRUBINUR NEGATIVE 11/05/2013 1332   KETONESUR 15 (A) 11/05/2013 1332   PROTEINUR 30 (A) 11/05/2013 1332   UROBILINOGEN 0.2 11/05/2013 1332   NITRITE NEGATIVE 11/05/2013 1332   LEUKOCYTESUR NEGATIVE 11/05/2013 1332    Sepsis Labs: Lactic Acid, Venous    Component Value Date/Time   LATICACIDVEN 1.67 10/28/2013 0936    MICROBIOLOGY: No results found for this or any previous visit (from the past 240 hour(s)).  RADIOLOGY STUDIES/RESULTS: Dg Chest 2 View  Result Date: 09/25/2018 CLINICAL DATA:  Cough 1 week which shortness-of-breath this morning and chest pressure. EXAM: CHEST - 2 VIEW COMPARISON:  11/05/2013 FINDINGS: Lungs are adequately inflated without consolidation or effusion. Mild stable cardiomegaly.  Stable mild compression deformity over the region of the upper lumbar spine. IMPRESSION: No acute cardiopulmonary disease. Mild stable cardiomegaly. Electronically Signed   By: Marin Olp M.D.   On: 09/25/2018 09:18   Ct Angio Chest Pe W Or Wo Contrast  Result Date: 09/25/2018 CLINICAL DATA:  Cough and shortness of breath EXAM: CT ANGIOGRAPHY CHEST WITH CONTRAST TECHNIQUE: Multidetector CT imaging of the chest was performed using the standard protocol during bolus administration of intravenous contrast. Multiplanar CT image reconstructions and MIPs were obtained to evaluate the vascular anatomy. CONTRAST:  111mL ISOVUE-370 IOPAMIDOL (ISOVUE-370) INJECTION 76% COMPARISON:  Plain film from earlier in the same day, CT from 10/28/2013. FINDINGS: Cardiovascular: Thoracic aorta demonstrates significant atherosclerotic calcification. Dilatation of the ascending aorta is noted to 3.4 cm. No evidence of dissection is  seen. No cardiac enlargement is noted. Diffuse coronary calcifications are seen. Pulmonary artery is well visualized within normal branching pattern. No filling defects to suggest pulmonary embolism are identified. Mediastinum/Nodes: No hilar or mediastinal adenopathy is noted. Esophagus appears within normal limits. The thoracic inlet shows changes consistent with prior thyroidectomy. Lungs/Pleura: Lungs are well aerated bilaterally with mild emphysematous changes. Scarring is noted in the left lower lobe posteriorly. No focal infiltrate or sizable effusion is seen. No parenchymal nodules are noted. Upper Abdomen: Visualized upper abdomen demonstrates fullness of the biliary tree related to the post cholecystectomy state. No other abnormality in the upper abdomen is seen. Musculoskeletal: Degenerative changes of the thoracic spine are noted. Bilateral breast calcifications are seen similar to that noted on prior exam. Review of the MIP images confirms the above findings. IMPRESSION: No evidence of pulmonary emboli. COPD. Mild dilatation of the ascending aorta 3.4 cm. Changes consistent with prior cholecystectomy. Aortic Atherosclerosis (ICD10-I70.0) and Emphysema (ICD10-J43.9). Electronically Signed   By: Inez Catalina M.D.   On: 09/25/2018 21:05     LOS: 0 days   Oren Binet, MD  [facility]  If 7PM-7AM, please contact night-coverage  Please page via [email or web address]-Password [email or web address]-click on MD name and type text message  09/26/2018, 12:11 PM

## 2018-09-26 NOTE — Progress Notes (Signed)
  Echocardiogram 2D Echocardiogram has been performed.  [name] 09/26/2018, 10:47 AM

## 2018-09-27 LAB — BASIC METABOLIC PANEL
Anion gap: 13 (ref 5–15)
Anion gap: 12 (ref 5–15)
BUN: 14 mg/dL (ref 8–23)
BUN: 24 mg/dL — ABNORMAL HIGH (ref 8–23)
CHLORIDE: 84 mmol/L — AB (ref 98–111)
CO2: 25 mmol/L (ref 22–32)
CO2: 29 mmol/L (ref 22–32)
CREATININE: 0.84 mg/dL (ref 0.44–1.00)
Calcium: 8.2 mg/dL — ABNORMAL LOW (ref 8.9–10.3)
Calcium: 8.5 mg/dL — ABNORMAL LOW (ref 8.9–10.3)
Chloride: 84 mmol/L — ABNORMAL LOW (ref 98–111)
Creatinine, Ser: 0.79 mg/dL (ref 0.44–1.00)
GFR calc Af Amer: >60 mL/min (ref >60)
GFR calc Af Amer: >60 mL/min (ref >60)
GFR calc non Af Amer: >60 mL/min (ref >60)
GFR calc non Af Amer: >60 mL/min (ref >60)
GLUCOSE: 99 mg/dL (ref 70–99)
Glucose, Bld: 132 mg/dL — ABNORMAL HIGH (ref 70–99)
Potassium: 3.7 mmol/L (ref 3.5–5.1)
Potassium: 4.2 mmol/L (ref 3.5–5.1)
Sodium: 122 mmol/L — ABNORMAL LOW (ref 135–145)
Sodium: 125 mmol/L — ABNORMAL LOW (ref 135–145)

## 2018-09-27 MED ORDER — BENZONATATE 100 MG PO CAPS
100.0000 mg | ORAL_CAPSULE | Freq: Three times a day (TID) | ORAL | Status: DC
  Administered 2018-09-27 – 2018-09-28 (×4): 100 mg via ORAL
  Filled 2018-09-27 (×4): qty 1

## 2018-09-27 MED ORDER — ALBUTEROL SULFATE (2.5 MG/3ML) 0.083% IN NEBU
2.5000 mg | INHALATION_SOLUTION | RESPIRATORY_TRACT | Status: DC | PRN
  Administered 2018-09-27: 2.5 mg via RESPIRATORY_TRACT
  Filled 2018-09-27: qty 3

## 2018-09-27 MED ORDER — GUAIFENESIN ER 600 MG PO TB12
600.0000 mg | ORAL_TABLET | Freq: Two times a day (BID) | ORAL | Status: DC
  Administered 2018-09-27 – 2018-09-28 (×3): 600 mg via ORAL
  Filled 2018-09-27 (×3): qty 1

## 2018-09-27 MED ORDER — TOLVAPTAN 15 MG PO TABS
15.0000 mg | ORAL_TABLET | Freq: Once | ORAL | Status: AC
  Administered 2018-09-27: 15 mg via ORAL
  Filled 2018-09-27: qty 1

## 2018-09-27 MED ORDER — AMOXICILLIN-POT CLAVULANATE 875-125 MG PO TABS
1.0000 | ORAL_TABLET | Freq: Two times a day (BID) | ORAL | Status: DC
  Administered 2018-09-27 – 2018-09-28 (×3): 1 via ORAL
  Filled 2018-09-27 (×3): qty 1

## 2018-09-27 MED ORDER — FLUTICASONE PROPIONATE 50 MCG/ACT NA SUSP
1.0000 | Freq: Every day | NASAL | Status: DC
  Filled 2018-09-27: qty 16

## 2018-09-27 NOTE — Plan of Care (Signed)
  Problem: Education: Goal: Knowledge of General Education information will improve Description: Including pain rating scale, medication(s)/side effects and non-pharmacologic comfort measures Outcome: Progressing   Problem: Clinical Measurements: Goal: Respiratory complications will improve Outcome: Progressing Goal: Cardiovascular complication will be avoided Outcome: Progressing   

## 2018-09-27 NOTE — Plan of Care (Signed)

## 2018-09-27 NOTE — Progress Notes (Signed)
[facility]                                                                              Patient Demographics  [name], is a 82 y.o. adult, DOB - Apr 03, 1936, WYO:[id number]  Admit date - 09/25/2018   Admitting Physician Antonieta Pert, MD  Outpatient Primary MD for the patient is Reynold Bowen, MD  Outpatient specialists:   LOS - 1  days   Medical records reviewed and are as summarized below:    Chief Complaint  Patient presents with  . Shortness of Breath       Brief summary   Patient is a 82 year old female with history of CAD, hypertension, dyslipidemia, does admit to have chronic hyponatremia " sodium runs low" presented with shortness of breath, coughing and chest congestion.  Patient was found to have hyponatremia of 124.  She was admitted for further work-up.  Assessment & Plan   Principal problem Acute on chronic hyponatremia -Na still trended down low to 122 today.  Baseline sodium 130-135 -Serum osmolarity at the time of admission to 62, TSH 0.34, urine osmolality 326 with urine sodium 108 -No clear evidence of CHF exacerbation, currently no JVD or peripheral edema.  Studies more consistent with mixed picture of volume depletion and possibly SIADH although urine osmolarity is not significantly high.  Patient and husband did admit to having significant stress at home due to family issues and patient has not been eating well -will discontinue IV Lasix, patient was given IV fluid bolus yesterday, place on regular diet to improve p.o. intake -Currently euvolemic, will try 1 dose of tolvaptan and recheck bmet at 4 PM, discussed with nephrology   Active problems:  Acute bronchitis -Still somewhat congested, coughing and shortness of breath -Patient had CT angiogram of the chest which showed no PE, has underlying COPD, mild dilatation of ascending aorta 3.4 cm - will place on albuterol neb, Tessalon Perles, Mucinex, Augmentin, Flonase  spray.  Elevated troponin likely demand ischemia in the setting of CAD -Cardiology was consulted, 2D echo showed normal systolic and diastolic function -Recommended no further cardiac testing at this time and follow-up with Dr. Percival Spanish outpatient  Essential hypertension -Currently BP bit elevated, patient also has chest congestion and anxiety -For now continue amlodipine, losartan, hydralazine, Imdur   Hypothyroidism Continue Synthroid, TSH 0.3  Code Status: Full CODE STATUS DVT Prophylaxis:  Lovenox  Family Communication: Discussed in detail with the patient, all imaging results, lab results explained to the patient and husband at the bedside   Disposition Plan: Recheck bmet today, if trending in the right direction, hoping to DC home in a.m.  Time Spent in minutes 35 minutes  Procedures:  2D echo  Consultants:   Cardiology Nephrology  Antimicrobials:      Medications  Scheduled Meds: . amLODipine  5 mg Oral Daily  . amoxicillin-clavulanate  1 tablet Oral Q12H  . aspirin  81 mg Oral Daily  . atorvastatin  40 mg Oral q1800  . benzonatate  100 mg Oral TID  . calcium-vitamin D  1 tablet Oral BID WC  . clopidogrel  75 mg Oral Daily  .  enoxaparin (LOVENOX) injection  40 mg Subcutaneous Q24H  . feeding supplement (ENSURE ENLIVE)  237 mL Oral BID BM  . fluticasone  1 spray Each Nare Daily  . guaiFENesin  600 mg Oral BID  . hydrALAZINE  25 mg Oral Q8H  . isosorbide mononitrate  30 mg Oral Daily  . levothyroxine  88 mcg Oral QAC breakfast  . losartan  50 mg Oral BID  . metoprolol succinate  100 mg Oral BID  . multivitamin with minerals  1 tablet Oral Daily  . omega-3 acid ethyl esters  1 g Oral Daily  . tolvaptan  15 mg Oral Once   Continuous Infusions: PRN Meds:.albuterol, hydrALAZINE, nitroGLYCERIN   Antibiotics   Anti-infectives (From admission, onward)   Start     Dose/Rate Route Frequency Ordered Stop   09/27/18 1000  amoxicillin-clavulanate (AUGMENTIN)  875-125 MG per tablet 1 tablet     1 tablet Oral Every 12 hours 09/27/18 0906                    Subjective:   [name] was seen and examined today.  Visibly frustrated with the sodium issue.  Currently no dizziness or lightheadedness, nausea vomiting.  Still feels short of breath and congested, coughing.  Patient denies chest pain, abdominal pain, N/V/D/C, new weakness, numbess, tingling. No acute events overnight.    Objective:   Vitals:   09/26/18 1936 09/27/18 0105 09/27/18 0503 09/27/18 0929  BP: (!) 149/77 (!) 165/79 (!) 151/66   Pulse: 74 72 61   Resp: 18 18 18    Temp: 97.6 F (36.4 C) 98.7 F (37.1 C) 98.8 F (37.1 C)   TempSrc:  Oral Oral   SpO2: 92% 91% 95% 95%  Weight:  55.4 kg    Height:        Intake/Output Summary (Last 24 hours) at 09/27/2018 0939 Last data filed at 09/27/2018 0350 Gross per 24 hour  Intake 360 ml  Output 2350 ml  Net -1990 ml     Wt Readings from Last 3 Encounters:  09/27/18 55.4 kg  05/17/18 60.3 kg  04/05/18 60.8 kg     Exam  General: Alert and oriented x 3, NAD  Eyes:   HEENT: No JVD  Cardiovascular: S1 S2 auscultated, Regular rate and rhythm.  Respiratory: Mild scattered wheezing  Gastrointestinal: Soft, nontender, nondistended, + bowel sounds  Ext: no pedal edema bilaterally  Neuro: No neuro deficits  Musculoskeletal: No digital cyanosis, clubbing  Skin: No rashes  Psych: Normal affect and demeanor, alert and oriented x3    Data Reviewed:  I have personally reviewed following labs and imaging studies  Micro Results No results found for this or any previous visit (from the past 240 hour(s)).  Radiology Reports Dg Chest 2 View  Result Date: 09/25/2018 CLINICAL DATA:  Cough 1 week which shortness-of-breath this morning and chest pressure. EXAM: CHEST - 2 VIEW COMPARISON:  11/05/2013 FINDINGS: Lungs are adequately inflated without consolidation or effusion. Mild stable cardiomegaly. Stable mild  compression deformity over the region of the upper lumbar spine. IMPRESSION: No acute cardiopulmonary disease. Mild stable cardiomegaly. Electronically Signed   By: Marin Olp M.D.   On: 09/25/2018 09:18   Ct Angio Chest Pe W Or Wo Contrast  Result Date: 09/25/2018 CLINICAL DATA:  Cough and shortness of breath EXAM: CT ANGIOGRAPHY CHEST WITH CONTRAST TECHNIQUE: Multidetector CT imaging of the chest was performed using the standard protocol during bolus administration of intravenous contrast. Multiplanar CT image  reconstructions and MIPs were obtained to evaluate the vascular anatomy. CONTRAST:  150mL ISOVUE-370 IOPAMIDOL (ISOVUE-370) INJECTION 76% COMPARISON:  Plain film from earlier in the same day, CT from 10/28/2013. FINDINGS: Cardiovascular: Thoracic aorta demonstrates significant atherosclerotic calcification. Dilatation of the ascending aorta is noted to 3.4 cm. No evidence of dissection is seen. No cardiac enlargement is noted. Diffuse coronary calcifications are seen. Pulmonary artery is well visualized within normal branching pattern. No filling defects to suggest pulmonary embolism are identified. Mediastinum/Nodes: No hilar or mediastinal adenopathy is noted. Esophagus appears within normal limits. The thoracic inlet shows changes consistent with prior thyroidectomy. Lungs/Pleura: Lungs are well aerated bilaterally with mild emphysematous changes. Scarring is noted in the left lower lobe posteriorly. No focal infiltrate or sizable effusion is seen. No parenchymal nodules are noted. Upper Abdomen: Visualized upper abdomen demonstrates fullness of the biliary tree related to the post cholecystectomy state. No other abnormality in the upper abdomen is seen. Musculoskeletal: Degenerative changes of the thoracic spine are noted. Bilateral breast calcifications are seen similar to that noted on prior exam. Review of the MIP images confirms the above findings. IMPRESSION: No evidence of pulmonary emboli.  COPD. Mild dilatation of the ascending aorta 3.4 cm. Changes consistent with prior cholecystectomy. Aortic Atherosclerosis (ICD10-I70.0) and Emphysema (ICD10-J43.9). Electronically Signed   By: Inez Catalina M.D.   On: 09/25/2018 21:05    Lab Data:  CBC: Recent Labs  Lab 09/25/18 0915  WBC 3.6*  NEUTROABS 2.7  HGB 12.9  HCT 38.0  MCV 94.3  PLT 409   Basic Metabolic Panel: Recent Labs  Lab 09/25/18 0915 09/26/18 0457 09/27/18 0428  NA 124* 123* 122*  K 4.4 4.2 3.7  CL 89* 87* 84*  CO2 23 24 25   GLUCOSE 115* 96 99  BUN 9 9 14   CREATININE 0.81 0.70 0.79  CALCIUM 8.3* 8.3* 8.2*   GFR: Estimated Creatinine Clearance (by C-G formula based on SCr of 0.79 mg/dL) Female: 44.4 mL/min Female: 56.9 mL/min Liver Function Tests: Recent Labs  Lab 09/25/18 0915 09/26/18 0457  AST 36 36  ALT 22 22  ALKPHOS 47 54  BILITOT 0.8 0.9  PROT 7.9 8.1  ALBUMIN 3.3* 3.7   No results for input(s): LIPASE, AMYLASE in the last 168 hours. No results for input(s): AMMONIA in the last 168 hours. Coagulation Profile: No results for input(s): INR, PROTIME in the last 168 hours. Cardiac Enzymes: Recent Labs  Lab 09/25/18 1535 09/25/18 2137 09/26/18 0457  TROPONINI 0.12* 0.07* 0.05*   BNP (last 3 results) No results for input(s): PROBNP in the last 8760 hours. HbA1C: No results for input(s): HGBA1C in the last 72 hours. CBG: No results for input(s): GLUCAP in the last 168 hours. Lipid Profile: No results for input(s): CHOL, HDL, LDLCALC, TRIG, CHOLHDL, LDLDIRECT in the last 72 hours. Thyroid Function Tests: Recent Labs    09/26/18 0457  TSH 0.349*   Anemia Panel: No results for input(s): VITAMINB12, FOLATE, FERRITIN, TIBC, IRON, RETICCTPCT in the last 72 hours. Urine analysis:    Component Value Date/Time   COLORURINE YELLOW 11/05/2013 1332   APPEARANCEUR CLEAR 11/05/2013 1332   LABSPEC 1.011 11/05/2013 1332   PHURINE 7.0 11/05/2013 1332   GLUCOSEU NEGATIVE 11/05/2013 1332    HGBUR SMALL (A) 11/05/2013 1332   BILIRUBINUR NEGATIVE 11/05/2013 1332   KETONESUR 15 (A) 11/05/2013 1332   PROTEINUR 30 (A) 11/05/2013 1332   UROBILINOGEN 0.2 11/05/2013 1332   NITRITE NEGATIVE 11/05/2013 1332   LEUKOCYTESUR NEGATIVE 11/05/2013 1332  Estill Cotta M.D. [facility] 09/27/2018, 9:39 AM  Pager: [phone] Between 7am to 7pm - call Pager - [phone]  After 7pm go to [email or web address] - password [email or web address]  Call night coverage person covering after 7pm

## 2018-09-27 NOTE — Progress Notes (Signed)
[facility] ROUNDING NOTE   Subjective:   Feels well this morning has no complaints.  Still very poor appetite  Blood pressure 151/66 pulse 61 temperature 98.8 O2 sats 95% room air  Sodium 122 potassium 3.7 chloride 85 CO2 25 BUN 14 creatinine 0.79 glucose 99 calcium 8.2  Objective:  Vital signs in last 24 hours:  Temp:  [97.6 F (36.4 C)-98.8 F (37.1 C)] 98.8 F (37.1 C) (12/24 0503) Pulse Rate:  [61-74] 61 (12/24 0503) Resp:  [16-18] 18 (12/24 0503) BP: (139-182)/(66-79) 151/66 (12/24 0503) SpO2:  [91 %-98 %] 95 % (12/24 0503) Weight:  [55.4 kg] 55.4 kg (12/24 0105)  Weight change: -0.862 kg Filed Weights   09/25/18 1610 09/26/18 0551 09/27/18 0105  Weight: 56.2 kg 55.5 kg 55.4 kg    Intake/Output: I/O last 3 completed shifts: In: 840 [P.O.:840] Out: 3050 [Urine:3050]   Intake/Output this shift:  Total I/O In: -  Out: 100 [Urine:100]  CVS- RRR no murmurs rubs gallops RS- CTA no wheezes rales ABD- BS present soft non-distended EXT- no edema   Basic Metabolic Panel: Recent Labs  Lab 09/25/18 0915 09/26/18 0457 09/27/18 0428  NA 124* 123* 122*  K 4.4 4.2 3.7  CL 89* 87* 84*  CO2 23 24 25   GLUCOSE 115* 96 99  BUN 9 9 14   CREATININE 0.81 0.70 0.79  CALCIUM 8.3* 8.3* 8.2*    Liver Function Tests: Recent Labs  Lab 09/25/18 0915 09/26/18 0457  AST 36 36  ALT 22 22  ALKPHOS 47 54  BILITOT 0.8 0.9  PROT 7.9 8.1  ALBUMIN 3.3* 3.7   No results for input(s): LIPASE, AMYLASE in the last 168 hours. No results for input(s): AMMONIA in the last 168 hours.  CBC: Recent Labs  Lab 09/25/18 0915  WBC 3.6*  NEUTROABS 2.7  HGB 12.9  HCT 38.0  MCV 94.3  PLT 177    Cardiac Enzymes: Recent Labs  Lab 09/25/18 1535 09/25/18 2137 09/26/18 0457  TROPONINI 0.12* 0.07* 0.05*    BNP: Invalid input(s): POCBNP  CBG: No results for input(s): GLUCAP in the last 168 hours.  Microbiology: Results for orders placed or performed during  the hospital encounter of 10/28/13  Urine culture     Status: None   Collection Time: 10/28/13  9:46 AM  Result Value Ref Range Status   Specimen Description URINE, RANDOM  Final   Special Requests NONE  Final   Culture  Setup Time   Final    10/28/2013 19:49 Performed at [place]   Final    45,000 COLONIES/ML Performed at [place] Performed at [facility]   Report Status 10/31/2013 FINAL  Final   Organism ID, Bacteria PROTEUS MIRABILIS  Final      Susceptibility   Proteus mirabilis - MIC*    AMPICILLIN <=2 SENSITIVE Sensitive     CEFAZOLIN 8 SENSITIVE Sensitive     CEFTRIAXONE <=1 SENSITIVE Sensitive     CIPROFLOXACIN <=0.25 SENSITIVE Sensitive     GENTAMICIN <=1 SENSITIVE Sensitive     LEVOFLOXACIN <=0.12 SENSITIVE Sensitive     NITROFURANTOIN 256 RESISTANT Resistant     TOBRAMYCIN <=1 SENSITIVE Sensitive     TRIMETH/SULFA <=20 SENSITIVE Sensitive     PIP/TAZO <=4 SENSITIVE Sensitive     * PROTEUS MIRABILIS    Coagulation Studies: No results for input(s): LABPROT, INR in the last 72  hours.  Urinalysis: No results for input(s): COLORURINE, LABSPEC, PHURINE, GLUCOSEU, HGBUR, BILIRUBINUR, KETONESUR, PROTEINUR, UROBILINOGEN, NITRITE, LEUKOCYTESUR in the last 72 hours.  Invalid input(s): APPERANCEUR    Imaging: Dg Chest 2 View  Result Date: 09/25/2018 CLINICAL DATA:  Cough 1 week which shortness-of-breath this morning and chest pressure. EXAM: CHEST - 2 VIEW COMPARISON:  11/05/2013 FINDINGS: Lungs are adequately inflated without consolidation or effusion. Mild stable cardiomegaly. Stable mild compression deformity over the region of the upper lumbar spine. IMPRESSION: No acute cardiopulmonary disease. Mild stable cardiomegaly. Electronically Signed   By: Marin Olp M.D.   On: 09/25/2018 09:18   Ct Angio Chest Pe W Or Wo Contrast  Result Date: 09/25/2018 CLINICAL DATA:  Cough and  shortness of breath EXAM: CT ANGIOGRAPHY CHEST WITH CONTRAST TECHNIQUE: Multidetector CT imaging of the chest was performed using the standard protocol during bolus administration of intravenous contrast. Multiplanar CT image reconstructions and MIPs were obtained to evaluate the vascular anatomy. CONTRAST:  142mL ISOVUE-370 IOPAMIDOL (ISOVUE-370) INJECTION 76% COMPARISON:  Plain film from earlier in the same day, CT from 10/28/2013. FINDINGS: Cardiovascular: Thoracic aorta demonstrates significant atherosclerotic calcification. Dilatation of the ascending aorta is noted to 3.4 cm. No evidence of dissection is seen. No cardiac enlargement is noted. Diffuse coronary calcifications are seen. Pulmonary artery is well visualized within normal branching pattern. No filling defects to suggest pulmonary embolism are identified. Mediastinum/Nodes: No hilar or mediastinal adenopathy is noted. Esophagus appears within normal limits. The thoracic inlet shows changes consistent with prior thyroidectomy. Lungs/Pleura: Lungs are well aerated bilaterally with mild emphysematous changes. Scarring is noted in the left lower lobe posteriorly. No focal infiltrate or sizable effusion is seen. No parenchymal nodules are noted. Upper Abdomen: Visualized upper abdomen demonstrates fullness of the biliary tree related to the post cholecystectomy state. No other abnormality in the upper abdomen is seen. Musculoskeletal: Degenerative changes of the thoracic spine are noted. Bilateral breast calcifications are seen similar to that noted on prior exam. Review of the MIP images confirms the above findings. IMPRESSION: No evidence of pulmonary emboli. COPD. Mild dilatation of the ascending aorta 3.4 cm. Changes consistent with prior cholecystectomy. Aortic Atherosclerosis (ICD10-I70.0) and Emphysema (ICD10-J43.9). Electronically Signed   By: Inez Catalina M.D.   On: 09/25/2018 21:05     Medications:    . amLODipine  5 mg Oral Daily  .  aspirin  81 mg Oral Daily  . atorvastatin  40 mg Oral q1800  . calcium-vitamin D  1 tablet Oral BID WC  . clopidogrel  75 mg Oral Daily  . enoxaparin (LOVENOX) injection  40 mg Subcutaneous Q24H  . feeding supplement (ENSURE ENLIVE)  237 mL Oral BID BM  . hydrALAZINE  25 mg Oral Q8H  . isosorbide mononitrate  30 mg Oral Daily  . levothyroxine  88 mcg Oral QAC breakfast  . losartan  50 mg Oral BID  . metoprolol succinate  100 mg Oral BID  . multivitamin with minerals  1 tablet Oral Daily  . omega-3 acid ethyl esters  1 g Oral Daily  . tolvaptan  15 mg Oral Once   hydrALAZINE, nitroGLYCERIN  Assessment/ Plan:   Hyponatremia euvolemic.  This appears to be chronic although we have a few data points.  He does not appear to be volume overloaded.  SIADH would be in the differential.  We could try tolvaptan to see if this promotes further free water diuresis.  She had an impressive diuresis yesterday with Lasix and I am  disappointed that her sodium did not increase.  We shall see how she responds to tolvaptan 15 mg today   LOS: [place] @TODAY @8 :34 AM

## 2018-09-28 LAB — BASIC METABOLIC PANEL
Anion gap: 16 — ABNORMAL HIGH (ref 5–15)
BUN: 21 mg/dL (ref 8–23)
CO2: 25 mmol/L (ref 22–32)
Calcium: 8.6 mg/dL — ABNORMAL LOW (ref 8.9–10.3)
Chloride: 88 mmol/L — ABNORMAL LOW (ref 98–111)
Creatinine, Ser: 0.85 mg/dL (ref 0.44–1.00)
Glucose, Bld: 101 mg/dL — ABNORMAL HIGH (ref 70–99)
Potassium: 5 mmol/L (ref 3.5–5.1)
SODIUM: 129 mmol/L — AB (ref 135–145)

## 2018-09-28 MED ORDER — HYDRALAZINE HCL 25 MG PO TABS: 25.0000 mg | ORAL_TABLET | Freq: Three times a day (TID) | ORAL | 1 refills | Status: DC

## 2018-09-28 MED ORDER — BENZONATATE 100 MG PO CAPS: 100.0000 mg | ORAL_CAPSULE | Freq: Three times a day (TID) | ORAL | 0 refills | Status: DC | PRN

## 2018-09-28 MED ORDER — GUAIFENESIN ER 600 MG PO TB12: 600.0000 mg | ORAL_TABLET | Freq: Two times a day (BID) | ORAL | 3 refills | Status: DC

## 2018-09-28 MED ORDER — AMOXICILLIN-POT CLAVULANATE 875-125 MG PO TABS: 1.0000 | ORAL_TABLET | Freq: Two times a day (BID) | ORAL | 0 refills | Status: AC

## 2018-09-28 MED ORDER — FLUTICASONE PROPIONATE 50 MCG/ACT NA SUSP: 1.0000 | Freq: Every day | NASAL | 2 refills | Status: DC | PRN

## 2018-09-28 NOTE — Discharge Summary (Signed)
Physician Discharge Summary   Patient ID: [name] MRN: [id number] DOB/AGE: 80-Aug-1937 80 y.o.  Admit date: 09/25/2018 Discharge date: 09/28/2018  Primary Care Physician:  Reynold Bowen, MD   Recommendations for Outpatient Follow-up:  1. Follow up with PCP in 1-2 weeks 2. Please obtain bmet in 1 week to check sodium level  Home Health: None  Equipment/Devices:   Discharge Condition: stable  CODE STATUS: DNR Diet recommendation: Regular diet   Discharge Diagnoses:      Acute on chronic hyponatremia  Acute bronchitis  Elevated troponin likely demand ischemia . Hypothyroidism . CAD- severe LAD and RCA disease with collaterals from CFX Sept 2009.    Medical Rx . Essential hypertension, benign    Consults:   Cardiology Nephrology    Allergies:   Allergies  Allergen Reactions  . Hydrocodone Other (See Comments)    "feels weird"  . Prednisone Nausea And Vomiting    Oral     DISCHARGE MEDICATIONS: Allergies as of 09/28/2018      Reactions   Hydrocodone Other (See Comments)   "feels weird"   Prednisone Nausea And Vomiting   Oral      Medication List    STOP taking these medications   metoprolol tartrate 25 MG tablet Commonly known as:  LOPRESSOR     TAKE these medications   amLODipine 5 MG tablet Commonly known as:  NORVASC Take 1 tablet (5 mg total) by mouth daily.   amoxicillin-clavulanate 875-125 MG tablet Commonly known as:  AUGMENTIN Take 1 tablet by mouth 2 (two) times daily for 7 days.   aspirin 81 MG tablet Take 81 mg by mouth daily.   atorvastatin 40 MG tablet Commonly known as:  LIPITOR Take 40 mg by mouth daily.   benzonatate 100 MG capsule Commonly known as:  TESSALON Take 1 capsule (100 mg total) by mouth 3 (three) times daily as needed for cough.   CALCIUM + D PO Take 1 tablet by mouth 2 (two) times daily.   clopidogrel 75 MG tablet Commonly known as:  PLAVIX Take 75 mg by mouth daily.   EYE DROPS A/C  OP Place 1 drop into both eyes every 6 (six) hours as needed (for dry eyes).   fish oil-omega-3 fatty acids 1000 MG capsule Take 1 g by mouth daily.   fluticasone 50 MCG/ACT nasal spray Commonly known as:  FLONASE Place 1 spray into both nostrils daily as needed for allergies or rhinitis.   guaiFENesin 600 MG 12 hr tablet Commonly known as:  MUCINEX Take 1 tablet (600 mg total) by mouth 2 (two) times daily.   hydrALAZINE 25 MG tablet Commonly known as:  APRESOLINE Take 1 tablet (25 mg total) by mouth 3 (three) times daily.   isosorbide mononitrate 30 MG 24 hr tablet Commonly known as:  IMDUR Take 30 mg by mouth daily.   levothyroxine 88 MCG tablet Commonly known as:  SYNTHROID, LEVOTHROID Take 1 tablet (88 mcg total) by mouth daily before breakfast.   losartan 50 MG tablet Commonly known as:  COZAAR Take 1 tablet (50 mg total) by mouth 2 (two) times daily.   metoprolol succinate 100 MG 24 hr tablet Commonly known as:  TOPROL-XL Take 100 mg by mouth 2 (two) times daily. Take with or immediately following a meal.   nitroGLYCERIN 0.4 MG SL tablet Commonly known as:  NITROSTAT Place 1 tablet (0.4 mg total) under the tongue every 5 (five) minutes as needed.   CENTRUM SILVER 50+WOMEN Tabs Take  1 tablet by mouth daily.   OCUVITE PRESERVISION PO Take by mouth 2 (two) times daily.   TYLENOL PO Take 500 mg by mouth every 6 (six) hours as needed (pain).        Brief H and P: For complete details please refer to admission H and P, but in brief Patient is a 82 year old female with history of CAD, hypertension, dyslipidemia, does admit to have chronic hyponatremia " sodium runs low" presented with shortness of breath, coughing and chest congestion.  Patient was found to have hyponatremia of 124.  She was admitted for further work-up.  Hospital Course:   Acute on chronic hyponatremia -Serum osmolarity at the time of admission to 62, TSH 0.34, urine osmolality 326 with urine  sodium 108.  Baseline sodium 130-135 -No clear evidence of CHF exacerbation, currently no JVD or peripheral edema.  Studies more consistent with mixed picture of volume depletion and possibly SIADH although urine osmolarity is not significantly high.  Patient and husband did admit to having significant stress at home due to family issues and patient had not been eating well -Patient was initially placed on IV Lasix for mild peripheral edema.  Nephrology consult was also obtained, who felt patient was euvolemic and gave a small fluid bolus.  -No improvement in the sodium level and trended down to 122.  -Patient was euvolemic, given 1 dose of tolvaptan, BMET checked, sodium improved to 129 today -Feels back to her baseline, wants to be discharged home due to Christmas.  She will follow-up with her PCP, Dr. Forde Dandy, obtain BMET to assess the sodium level in 1 week   Acute bronchitis -Patient's presenting complaint was chest congestion with URI symptoms, mild wheezing and sinusitis. -Patient had CT angiogram of the chest which showed no PE, has underlying COPD, mild dilatation of ascending aorta 3.4 cm - Patient was placed on Tessalon Perles, Mucinex, Augmentin for 7 days, Flonase spray.  She received 1 breathing treatment. Symptoms improving.  Elevated troponin likely demand ischemia in the setting of CAD -Cardiology was consulted, 2D echo showed normal systolic and diastolic function -Recommended no further cardiac testing at this time and follow-up with Dr. Percival Spanish outpatient  Essential hypertension -BP was bit elevated hence patient was started on hydralazine.   -For now continue amlodipine, losartan, hydralazine, Imdur   Hypothyroidism Continue Synthroid, TSH 0.3  Day of Discharge S: Feels a lot better, happy that her sodium is improving, husband at bedside, feels back to baseline.  BP (!) 141/63   Pulse 71   Temp 98 F (36.7 C) (Oral)   Resp 18   Ht 5\' 2"  (1.575 m)   Wt 54.9  kg   SpO2 93%   BMI 22.15 kg/m   Physical Exam: General: Alert and awake oriented x3 not in any acute distress. HEENT: anicteric sclera, pupils reactive to light and accommodation CVS: S1-S2 clear no murmur rubs or gallops Chest: Mild scattered wheezing Abdomen: soft nontender, nondistended, normal bowel sounds Extremities: no cyanosis, clubbing or edema noted bilaterally Neuro: Cranial nerves II-XII intact, no focal neurological deficits   The results of significant diagnostics from this hospitalization (including imaging, microbiology, ancillary and laboratory) are listed below for reference.      Procedures/Studies:  Dg Chest 2 View  Result Date: 09/25/2018 CLINICAL DATA:  Cough 1 week which shortness-of-breath this morning and chest pressure. EXAM: CHEST - 2 VIEW COMPARISON:  11/05/2013 FINDINGS: Lungs are adequately inflated without consolidation or effusion. Mild stable cardiomegaly. Stable mild compression deformity  over the region of the upper lumbar spine. IMPRESSION: No acute cardiopulmonary disease. Mild stable cardiomegaly. Electronically Signed   By: Marin Olp M.D.   On: 09/25/2018 09:18   Ct Angio Chest Pe W Or Wo Contrast  Result Date: 09/25/2018 CLINICAL DATA:  Cough and shortness of breath EXAM: CT ANGIOGRAPHY CHEST WITH CONTRAST TECHNIQUE: Multidetector CT imaging of the chest was performed using the standard protocol during bolus administration of intravenous contrast. Multiplanar CT image reconstructions and MIPs were obtained to evaluate the vascular anatomy. CONTRAST:  138mL ISOVUE-370 IOPAMIDOL (ISOVUE-370) INJECTION 76% COMPARISON:  Plain film from earlier in the same day, CT from 10/28/2013. FINDINGS: Cardiovascular: Thoracic aorta demonstrates significant atherosclerotic calcification. Dilatation of the ascending aorta is noted to 3.4 cm. No evidence of dissection is seen. No cardiac enlargement is noted. Diffuse coronary calcifications are seen. Pulmonary  artery is well visualized within normal branching pattern. No filling defects to suggest pulmonary embolism are identified. Mediastinum/Nodes: No hilar or mediastinal adenopathy is noted. Esophagus appears within normal limits. The thoracic inlet shows changes consistent with prior thyroidectomy. Lungs/Pleura: Lungs are well aerated bilaterally with mild emphysematous changes. Scarring is noted in the left lower lobe posteriorly. No focal infiltrate or sizable effusion is seen. No parenchymal nodules are noted. Upper Abdomen: Visualized upper abdomen demonstrates fullness of the biliary tree related to the post cholecystectomy state. No other abnormality in the upper abdomen is seen. Musculoskeletal: Degenerative changes of the thoracic spine are noted. Bilateral breast calcifications are seen similar to that noted on prior exam. Review of the MIP images confirms the above findings. IMPRESSION: No evidence of pulmonary emboli. COPD. Mild dilatation of the ascending aorta 3.4 cm. Changes consistent with prior cholecystectomy. Aortic Atherosclerosis (ICD10-I70.0) and Emphysema (ICD10-J43.9). Electronically Signed   By: Inez Catalina M.D.   On: 09/25/2018 21:05       LAB RESULTS: Basic Metabolic Panel: Recent Labs  Lab 09/27/18 1602 09/28/18 0508  NA 125* 129*  K 4.2 5.0  CL 84* 88*  CO2 29 25  GLUCOSE 132* 101*  BUN 24* 21  CREATININE 0.84 0.85  CALCIUM 8.5* 8.6*   Liver Function Tests: Recent Labs  Lab 09/25/18 0915 09/26/18 0457  AST 36 36  ALT 22 22  ALKPHOS 47 54  BILITOT 0.8 0.9  PROT 7.9 8.1  ALBUMIN 3.3* 3.7   No results for input(s): LIPASE, AMYLASE in the last 168 hours. No results for input(s): AMMONIA in the last 168 hours. CBC: Recent Labs  Lab 09/25/18 0915  WBC 3.6*  NEUTROABS 2.7  HGB 12.9  HCT 38.0  MCV 94.3  PLT 177   Cardiac Enzymes: Recent Labs  Lab 09/25/18 2137 09/26/18 0457  TROPONINI 0.07* 0.05*   BNP: Invalid input(s): POCBNP CBG: No  results for input(s): GLUCAP in the last 168 hours.    Disposition and Follow-up: Discharge Instructions    Diet general   Complete by:  As directed    Increase activity slowly   Complete by:  As directed        DISPOSITION: [place], [place]. Schedule an appointment as soon as possible for a visit in 2 week(s).   Specialty:  Endocrinology Why:  please have the labs checked for BMET (sodium level) Contact information: [place] [address] [phone]        [facility]. Schedule an appointment as soon as possible for a visit in 2 week(s).  Why:  as needed Contact information: [place] [address]           Time coordinating discharge:  35 minutes  Signed:   Estill Cotta M.D. [facility] 09/28/2018, 11:34 AM Pager: [phone]

## 2018-09-29 ENCOUNTER — Encounter: Payer: Self-pay | Admitting: Internal Medicine

## 2018-10-10 ENCOUNTER — Other Ambulatory Visit (INDEPENDENT_AMBULATORY_CARE_PROVIDER_SITE_OTHER): Payer: Medicare Other

## 2018-10-10 ENCOUNTER — Ambulatory Visit: Payer: Medicare Other | Admitting: Nurse Practitioner

## 2018-10-10 ENCOUNTER — Encounter: Payer: Self-pay | Admitting: Nurse Practitioner

## 2018-10-10 VITALS — BP 120/50 | HR 60 | Ht 61.0 in | Wt 128.4 lb

## 2018-10-10 DIAGNOSIS — K625 Hemorrhage of anus and rectum: Secondary | ICD-10-CM | POA: Diagnosis not present

## 2018-10-10 LAB — CBC
HCT: 35.9 % — ABNORMAL LOW (ref 36.0–46.0)
Hemoglobin: 12.4 g/dL (ref 12.0–15.0)
MCHC: 34.6 g/dL (ref 30.0–36.0)
MCV: 94 fl (ref 78.0–100.0)
Platelets: 326 10*3/uL (ref 150.0–400.0)
RBC: 3.82 Mil/uL — ABNORMAL LOW (ref 3.87–5.11)
RDW: 12.8 % (ref 11.5–15.5)
WBC: 6 10*3/uL (ref 4.0–10.5)

## 2018-10-10 MED ORDER — HYDROCORTISONE 2.5 % RE CREA: 1.0000 "application " | TOPICAL_CREAM | Freq: Every day | RECTAL | 0 refills | Status: DC

## 2018-10-10 NOTE — Progress Notes (Addendum)
Agree with Ms. 's assessment and plan. Hard to know significance of Hemosure + stool in setting of chronic rectal bleedng Hgb was NL Gatha Mayer, MD, [facility]                ASSESSMENT / PLAN:   83 year old female with chronic intermittent painless rectal bleeding (on plavix + ASA) and known history of hemorrhoids.  Bleeding dates back to when patient was pregnant.  Patient says she is actually here because PCP found blood in stool on a stool test.  Requested records from PCP while patient in office and she had a positive Hemosure.  -No polyps on last colonoscopy but that dates was back to 2004.  -Hemorrhoidal bleeding can interfere with Hemosure results but of course colon neoplasm cannot be excluded and patient aware of this. We discussed colonoscopy but at this point she would like to hold off.  -of note, hemoglobin was normal during recent hospital admission though it was lower than the one in EMR in 2015.  With fluids and lab draws in the hospital hemoglobin can vary so will recheck that today -Her weight loss is concerning but patient attributes it to significant stress surrounding daughter's divorce with young children.  Her husband has also lost 10 pounds during this stressful time.  -She hasn't tried any treatment for hemorrhoids.Trial of Anusol cream nightly for a week  -Patient does fairly well managing chronic constipation.  Stools are soft. Prunes always help. -Will call patient with CBC results in a few days.   HPI:    Chief Complaint:   Positive IFOBT  Patient is an 83 year old female with CAD, hypertension, hypothyroidism, hx of DVT / PE. She is known to Dr. Carlean Purl from a screening colonoscopy in 2004.  She had a screening colonoscopy 2004 with findings of moderately to severe sigmoid diverticulosis, internal and external hemorrhoids. Patient returned in 2014 to discuss with her there was a need to continue with screening colonoscopies.  We recommended proceeding  with repeat screening colonoscopy after holding Plavix versus annual IFOBTs.  She apparently opted not to pursue colonoscopy.    Patient gives a history of chronic intermittent painless rectal  bleeding dating back to pregnancies.  She has been on Plavix for 20 years and also takes a baby aspirin. She has known internal and external hemorrhoids based on colonoscopy in 2004.  The bleeding is intermittent that she may bleed for a few days and then not bleed again for several days.    She was recently hospitalized with acute on chronic hyponatremia as well as bronchitis   Data reviewed:  Sodium at time of discharge was 129, up from 122.  Renal function was normal.  Hemoglobin 12.9 - only comparative was from 2015 at which time hemoglobin was 14.5  Overall patient feels okay but does admit to a 15 pound weight loss over the last several months.  Her appetite is poor.  She attributes the weight loss to stress.  Her daughter who has three young kids is midst of  bitter divorce and patient has 3 grandkids.  Husband is here and says that he is also lost about 10 pounds from the stress .  Patient is drinking 2 ensures a day, her weight has plateaued, possibly even gained a pound or two.    Past Medical History:  Diagnosis Date  . CAD, UNSPECIFIED SITE    2009. 99% proximal RCA stenosis followed by 80% distal stenosis. The LAD has a  100% stenosis in the midsegment. The circumflex is patent with distal occlusion. She has been managed medically. Her EF was well-preserved.  Marland Kitchen CAROTID STENOSIS   . DIVERTICULAR DISEASE   . Essential hypertension, benign   . Gallstones 2009  . Gastritis   . H/O blood clots 1984   DVT and PE  . Hx of degenerative disc disease   . HYPERLIPIDEMIA   . MACULAR DEGENERATION   . OSTEOPOROSIS   . Pneumonia   . Rheumatoid arthritis(714.0)   . Thyroid cancer ([place]) 2008     Past Surgical History:  Procedure Laterality Date  . ABDOMINAL HYSTERECTOMY  1984  . APPENDECTOMY   1959  . BREAST CYST EXCISION     left  . CATARACT EXTRACTION, BILATERAL    . CHOLECYSTECTOMY  2009  . COLONOSCOPY    . ESOPHAGOGASTRODUODENOSCOPY    . IM NAILING FEMORAL SHAFT RETROGRADE  2010   left  . TONSILLECTOMY    . WRIST SURGERY  2004   Left   Family History  Problem Relation Age of Onset  . Heart attack Mother   . Clotting disorder Mother   . Heart disease Father   . Diabetes Father   . Ovarian cancer Maternal Aunt   . Lung cancer Brother   . CAD Sister   . Diabetes Niece   . Lung cancer Maternal Uncle    Social History   Tobacco Use  . Smoking status: Never Smoker  . Smokeless tobacco: Never Used  Substance Use Topics  . Alcohol use: No  . Drug use: No   Current Outpatient Medications  Medication Sig Dispense Refill  . Acetaminophen (TYLENOL PO) Take 500 mg by mouth every 6 (six) hours as needed (pain).     Marland Kitchen amLODipine (NORVASC) 5 MG tablet Take 1 tablet (5 mg total) by mouth daily. 90 tablet 3  . aspirin 81 MG tablet Take 81 mg by mouth daily.      Marland Kitchen atorvastatin (LIPITOR) 40 MG tablet Take 40 mg by mouth daily.      . Calcium Carbonate-Vitamin D (CALCIUM + D PO) Take 1 tablet by mouth 2 (two) times daily.     . clopidogrel (PLAVIX) 75 MG tablet Take 75 mg by mouth daily.      . fish oil-omega-3 fatty acids 1000 MG capsule Take 1 g by mouth daily.     . hydrALAZINE (APRESOLINE) 25 MG tablet Take 1 tablet (25 mg total) by mouth 3 (three) times daily. 90 tablet 1  . isosorbide mononitrate (IMDUR) 30 MG 24 hr tablet Take 30 mg by mouth daily.      Marland Kitchen levothyroxine (SYNTHROID, LEVOTHROID) 88 MCG tablet Take 1 tablet (88 mcg total) by mouth daily before breakfast. 30 tablet 6  . losartan (COZAAR) 50 MG tablet Take 1 tablet (50 mg total) by mouth 2 (two) times daily. 180 tablet 1  . metoprolol succinate (TOPROL-XL) 100 MG 24 hr tablet Take 100 mg by mouth 2 (two) times daily. Take with or immediately following a meal.    . Multiple Vitamins-Minerals (CENTRUM SILVER  50+WOMEN) TABS Take 1 tablet by mouth daily.    . Multiple Vitamins-Minerals (OCUVITE PRESERVISION PO) Take by mouth 2 (two) times daily.      . nitroGLYCERIN (NITROSTAT) 0.4 MG SL tablet Place 1 tablet (0.4 mg total) under the tongue every 5 (five) minutes as needed. 25 tablet 6  . Tetrahydrozoline-Zn Sulfate (EYE DROPS A/C OP) Place 1 drop into both eyes every 6 (six)  hours as needed (for dry eyes).      No current facility-administered medications for this visit.    Allergies  Allergen Reactions  . Hydrocodone Other (See Comments)    "feels weird"  . Prednisone Nausea And Vomiting    Oral     Review of Systems: All systems reviewed and negative except where noted in HPI.   Serum creatinine: 0.85 mg/dL 09/28/18 1833 Estimated creatinine clearance: 43.3 mL/min (Female) Estimated creatinine clearance: 51.3 mL/min (Female)   Physical Exam:    Wt Readings from Last 3 Encounters:  10/10/18 128 lb 6 oz (58.2 kg)  09/28/18 121 lb 1.6 oz (54.9 kg)  05/17/18 133 lb (60.3 kg)    BP (!) 120/50 (BP Location: Left Arm, Patient Position: Sitting, Cuff Size: Normal)   Pulse 60   Ht 5\' 1"  (1.549 m) Comment: height measured without shoes  Wt 128 lb 6 oz (58.2 kg)   BMI 24.26 kg/m  Constitutional:  Pleasant female in no acute distress. Psychiatric: Normal mood and affect. Behavior is normal. EENT: Pupils normal.  Conjunctivae are normal. No scleral icterus. Neck supple.  Cardiovascular: Normal rate, regular rhythm, + murmur. No edema Pulmonary/chest: Effort normal and breath sounds normal. No wheezing, rales or rhonchi. Abdominal: Soft, nondistended, nontender. Bowel sounds active throughout. There are no masses palpable. No hepatomegaly. Neurological: Alert and oriented to person place and time. Skin: Skin is warm and dry. No rashes noted.  Tye Savoy, NP  10/10/2018, 10:16 AM  Cc: Reynold Bowen, MD

## 2018-10-10 NOTE — Patient Instructions (Signed)
If you are age 81 or older, your body mass index should be between 23-30. Your Body mass index is 24.26 kg/m. If this is out of the aforementioned range listed, please consider follow up with your Primary Care Provider.  If you are age 50 or younger, your body mass index should be between 19-25. Your Body mass index is 24.26 kg/m. If this is out of the aformentioned range listed, please consider follow up with your Primary Care Provider.   Your provider has requested that you go to the basement level for lab work before leaving today. Press "B" on the elevator. The lab is located at the first door on the left as you exit the elevator.  We have sent the following medications to your pharmacy for you to pick up at your convenience: Anusol Cream  We will call you with results. Thank you for choosing me and [facility].   Tye Savoy, NP

## 2018-10-11 ENCOUNTER — Ambulatory Visit: Payer: Medicare Other | Admitting: Adult Health

## 2018-10-11 ENCOUNTER — Encounter: Payer: Self-pay | Admitting: Adult Health

## 2018-10-11 VITALS — BP 160/62 | HR 59 | Ht 61.0 in | Wt 129.4 lb

## 2018-10-11 DIAGNOSIS — I10 Essential (primary) hypertension: Secondary | ICD-10-CM | POA: Diagnosis not present

## 2018-10-11 DIAGNOSIS — I358 Other nonrheumatic aortic valve disorders: Secondary | ICD-10-CM

## 2018-10-11 DIAGNOSIS — I2510 Atherosclerotic heart disease of native coronary artery without angina pectoris: Secondary | ICD-10-CM

## 2018-10-11 DIAGNOSIS — F418 Other specified anxiety disorders: Secondary | ICD-10-CM | POA: Diagnosis not present

## 2018-10-11 NOTE — Progress Notes (Signed)
Cardiology Office Note   Date:  10/11/2018   ID:  [name], DOB 1936/06/03, MRN [id number]  PCP:  Reynold Bowen, MD  Cardiologist:  [facility]   Chief Complaint  Patient presents with  . Hypertension     History of Present Illness: [name] is a 83 y.o. adult who presents for ongoing assessment and management of CAD, managed medically, (cath in 2009 revealed 99% stenosis of the proximal RCA, 80% distal stenosis, with LAD 100% mid-segment stenosis,,  negative stress test in 2016.  Other history includes HTN which was not well controlled on last office visit in 05/2018 . Amlodipine 2.5 was added.   Since being seen last she was hospitalized for bronchitis and significant dyspnea. She was found to be hypertensive. She was started on hydralazine 25 mg TID, increased on amlodipine to 5 mg daily, continued on isosorbide at 30 mg daily and losartan. Started on course of Augmentin which she has finished. She was also found to be hyponatremic, without signs of CHF. She was found to be dehydrated and given IV fluids.   The patient and her husband speak at length about personal stressors within their family which are significantly impacting their health. Their daughter is going through a bitter divorce with a custody issue causing them to be under significant stress concerning the daughter and their grandchildren. They are losing weight, not sleeping and have felt run down.  They are taking MVI and trying to get rest but the stress is very prominently affecting them.    Past Medical History:  Diagnosis Date  . CAD, UNSPECIFIED SITE    2009. 99% proximal RCA stenosis followed by 80% distal stenosis. The LAD has a 100% stenosis in the midsegment. The circumflex is patent with distal occlusion. She has been managed medically. Her EF was well-preserved.  Marland Kitchen CAROTID STENOSIS   . DIVERTICULAR DISEASE   . Essential hypertension, benign   . Gallstones 2009  . Gastritis   . H/O blood clots 1984     DVT and PE  . Hx of degenerative disc disease   . HYPERLIPIDEMIA   . MACULAR DEGENERATION   . OSTEOPOROSIS   . Pneumonia   . Rheumatoid arthritis(714.0)   . Thyroid cancer ([place]) 2008    Past Surgical History:  Procedure Laterality Date  . ABDOMINAL HYSTERECTOMY  1984  . APPENDECTOMY  1959  . BREAST CYST EXCISION     left  . CATARACT EXTRACTION, BILATERAL    . CHOLECYSTECTOMY  2009  . COLONOSCOPY    . ESOPHAGOGASTRODUODENOSCOPY    . IM NAILING FEMORAL SHAFT RETROGRADE  2010   left  . TONSILLECTOMY    . WRIST SURGERY  2004   Left     Current Outpatient Medications  Medication Sig Dispense Refill  . Acetaminophen (TYLENOL PO) Take 500 mg by mouth every 6 (six) hours as needed (pain).     Marland Kitchen amLODipine (NORVASC) 5 MG tablet Take 1 tablet (5 mg total) by mouth daily. 90 tablet 3  . aspirin 81 MG tablet Take 81 mg by mouth daily.      Marland Kitchen atorvastatin (LIPITOR) 40 MG tablet Take 40 mg by mouth daily.      . Calcium Carbonate-Vitamin D (CALCIUM + D PO) Take 1 tablet by mouth 2 (two) times daily.     . clopidogrel (PLAVIX) 75 MG tablet Take 75 mg by mouth daily.      . fish oil-omega-3 fatty acids 1000 MG capsule Take 1 g  by mouth daily.     . hydrALAZINE (APRESOLINE) 25 MG tablet Take 1 tablet (25 mg total) by mouth 3 (three) times daily. 90 tablet 1  . hydrocortisone (ANUSOL-HC) 2.5 % rectal cream Place 1 application rectally at bedtime. Use inside rectum at bedtime for 7 days 30 g 0  . isosorbide mononitrate (IMDUR) 30 MG 24 hr tablet Take 30 mg by mouth daily.      Marland Kitchen levothyroxine (SYNTHROID, LEVOTHROID) 88 MCG tablet Take 1 tablet (88 mcg total) by mouth daily before breakfast. 30 tablet 6  . losartan (COZAAR) 50 MG tablet Take 1 tablet (50 mg total) by mouth 2 (two) times daily. 180 tablet 1  . metoprolol succinate (TOPROL-XL) 100 MG 24 hr tablet Take 100 mg by mouth 2 (two) times daily. Take with or immediately following a meal.    . Multiple Vitamins-Minerals (CENTRUM  SILVER 50+WOMEN) TABS Take 1 tablet by mouth daily.    . Multiple Vitamins-Minerals (OCUVITE PRESERVISION PO) Take by mouth 2 (two) times daily.      . nitroGLYCERIN (NITROSTAT) 0.4 MG SL tablet Place 1 tablet (0.4 mg total) under the tongue every 5 (five) minutes as needed. 25 tablet 6  . Tetrahydrozoline-Zn Sulfate (EYE DROPS A/C OP) Place 1 drop into both eyes every 6 (six) hours as needed (for dry eyes).      No current facility-administered medications for this visit.     Allergies:   Hydrocodone and Prednisone    Social History:  The patient  reports that he has never smoked. He has never used smokeless tobacco. He reports that he does not drink alcohol or use drugs.   Family History:  The patient's family history includes CAD in his sister; Clotting disorder in his mother; Diabetes in his father and niece; Heart attack in his mother; Heart disease in his father; Lung cancer in his brother and maternal uncle; Ovarian cancer in his maternal aunt.    ROS: All other systems are reviewed and negative. Unless otherwise mentioned in H&P    PHYSICAL EXAM: VS:  BP (!) 160/62   Pulse (!) 59   Ht 5\' 1"  (1.549 m)   Wt 129 lb 6.4 oz (58.7 kg)   SpO2 97%   BMI 24.45 kg/m  , BMI Body mass index is 24.45 kg/m. GEN: Well nourished, well developed, in no acute distress HEENT: normal Neck: no JVD, carotid bruits, or masses Cardiac: RRR; 2/6 systolic murmurs, rubs, or gallops,no edema  Respiratory:  Clear to auscultation bilaterally, normal work of breathing GI: soft, nontender, nondistended, + BS MS: no deformity or atrophy Skin: warm and dry, no rash Neuro:  Strength and sensation are intact Psych: euthymic mood, full affect   EKG:  Not completed.  Recent Labs: 09/25/2018: B Natriuretic Peptide 307.5 09/26/2018: ALT 22; TSH 0.349 09/28/2018: BUN 21; Creatinine, Ser 0.85; Potassium 5.0; Sodium 129 10/10/2018: Hemoglobin 12.4; Platelets 326.0    Lipid Panel    Component Value  Date/Time   CHOL 157 07/04/2015 0856   TRIG 84 07/04/2015 0856   HDL 79 07/04/2015 0856   CHOLHDL 2.0 07/04/2015 0856   VLDL 17 07/04/2015 0856   LDLCALC 61 07/04/2015 0856      Wt Readings from Last 3 Encounters:  10/11/18 129 lb 6.4 oz (58.7 kg)  10/10/18 128 lb 6 oz (58.2 kg)  09/28/18 121 lb 1.6 oz (54.9 kg)      Other studies Reviewed: Echocardiogram August 24, 2009 Left ventricle: The cavity size was normal. Systolic function  was normal. The estimated ejection fraction was in the range of 55% to 60%. Wall motion was normal; there were no regional wall motion abnormalities. Aortic valve: Trileaflet; normal thickness leaflets. Mobility was not restricted. Doppler: Transvalvular velocity was within the normal range. There was no stenosis. No regurgitation. Aorta: Aortic root: The aortic root was normal in size. Mitral valve: Structurally normal valve. Mobility was not restricted. Doppler: Transvalvular velocity was within the normal range. There was no evidence for stenosis. No regurgitation. Peak gradient: 61mm Hg (D). Left atrium: The atrium was normal in size. Right ventricle: The cavity size was normal. Wall thickness was normal. Systolic function was normal. Pulmonic valve: Doppler: Transvalvular velocity was within the normal range. There was no evidence for stenosis. Tricuspid valve: Structurally normal valve. Doppler: Transvalvular velocity was within the normal range. No regurgitation. Pulmonary artery: The main pulmonary artery was normal-sized. Systolic pressure was within the normal range. Right atrium: The atrium was normal in size. Pericardium: There was no pericardial effusion. Systemic veins: Inferior vena cava: The vessel was normal in size.  ASSESSMENT AND PLAN:  1. Hypertension: BP is elevated today despite multiple medications. She is very stressed today. I think that the BP elevation is related to this. She was controlled  while hospitalized. This will need to be monitored at home with titration of medication if necessary.   2. AoV sclerosis: Consider repeating echo on next visit. Last echo was in 2010.   3. CAD:   Severe LAD and RCA disease with collateral circulation from the Cx. She is being treated medically. She is not having chest pain, and is on nitrates and BB. Can consider repeat ischemic work up if BP is not better controlled on next visit.   4. Situation stress and anxiety: They are seeing PCP tomorrow. I have asked her to talk to them about low dose Ativan to assist with anxiety and helping with sleep. This may help for improvement in ability to cope with their family stress temporarily. Will defer to PCP.    Current medicines are reviewed at length with the patient today.    Labs/ tests ordered today include:None  Phill Myron. [place] Pugh, ANP, AACC   10/11/2018 2:23 PM    [address] Office [phone] Fax [phone]

## 2018-10-11 NOTE — Patient Instructions (Signed)
Follow-Up: You will need a follow up appointment in 3 months.  You may see Minus Breeding, MD Jory Sims, DNP, [place] or one of the following Advanced Practice Providers on your designated Care Team:  Jory Sims, DNP, AACC   Rosaria Ferries, PA-C  Medication Instructions:  NO CHANGES- Your physician recommends that you continue on your current medications as directed. Please refer to the Current Medication list given to you today. If you need a refill on your cardiac medications before your next appointment, please call your pharmacy. Labwork: When you have labs (blood work) and your tests are completely normal, you will receive your results ONLY by [place] (if you have MyChart) -OR- A paper copy in the mail. At [facility], you and your health needs are our priority.  As part of our continuing mission to provide you with exceptional heart care, we have created designated Provider Care Teams.  These Care Teams include your primary Cardiologist (physician) and Advanced Practice Providers (APPs -  Physician Assistants and Nurse Practitioners) who all work together to provide you with the care you need, when you need it.  Thank you for choosing [facility] at [facility]!!

## 2018-10-12 DIAGNOSIS — E871 Hypo-osmolality and hyponatremia: Secondary | ICD-10-CM | POA: Diagnosis not present

## 2018-10-12 DIAGNOSIS — I10 Essential (primary) hypertension: Secondary | ICD-10-CM | POA: Diagnosis not present

## 2018-10-12 DIAGNOSIS — R7989 Other specified abnormal findings of blood chemistry: Secondary | ICD-10-CM | POA: Diagnosis not present

## 2018-10-12 DIAGNOSIS — J40 Bronchitis, not specified as acute or chronic: Secondary | ICD-10-CM | POA: Diagnosis not present

## 2018-10-13 NOTE — Progress Notes (Signed)
I would not be inclined to do a colonoscopy in her situation as she has had bleeding hemorrhoids and that can make the hemosure +  Also not anemic  She was not inclined to do a colonoscopy and so unless she changes her mind would stick with that plan.

## 2018-10-14 DIAGNOSIS — I10 Essential (primary) hypertension: Secondary | ICD-10-CM | POA: Diagnosis not present

## 2018-10-14 DIAGNOSIS — E871 Hypo-osmolality and hyponatremia: Secondary | ICD-10-CM | POA: Diagnosis not present

## 2018-10-14 DIAGNOSIS — Z6822 Body mass index (BMI) 22.0-22.9, adult: Secondary | ICD-10-CM | POA: Diagnosis not present

## 2018-10-26 ENCOUNTER — Telehealth: Payer: Self-pay

## 2018-10-26 NOTE — Telephone Encounter (Signed)
Spoke to patient about recent email.She stated when she was in hospital recently metoprolol was stopped and was started on hydralazine.Stated hydralazine has made her tremors worse.Stated she has trouble holding a fork or a spoon.She also has been having fast heart beat at times.Advised I will make Dr.Hochrein aware.Appointment scheduled with Dr.Hochrein 11/01/18 at 7:40 am.

## 2018-10-28 NOTE — Telephone Encounter (Signed)
I will discuss at her appt.

## 2018-10-30 NOTE — Progress Notes (Signed)
HPI The patient presents for followup of her coronary artery disease.  She was in the hospital in Dec with bronchitis.  She was found to have significant hypertension as well.  She was started on hydralazine and amlodipine was increased.  On reviewing her hospital discharge her beta-blocker was discontinued.  However, she called recently and said that she had increasing tremors. .  Of note during that hospitalization she was found to have an elevated troponin.  2D echo showed no new wall motion abnormalities and this was thought to be demand ischemia.  She was managed medically.   She says she feels poorly since she went home.  She has been weak.  She has tremors.  She has not been able to do any activities.  She is not having any chest pressure, neck or arm discomfort.  She feels she is recovered from her bronchitis.   Allergies  Allergen Reactions  . Hydrocodone Other (See Comments)    "feels weird"  . Prednisone Nausea And Vomiting    Oral    Current Outpatient Medications  Medication Sig Dispense Refill  . Acetaminophen (TYLENOL PO) Take 500 mg by mouth every 6 (six) hours as needed (pain).     Marland Kitchen amLODipine (NORVASC) 5 MG tablet Take 1 tablet (5 mg total) by mouth daily. 90 tablet 3  . aspirin 81 MG tablet Take 81 mg by mouth daily.      Marland Kitchen atorvastatin (LIPITOR) 40 MG tablet Take 40 mg by mouth daily.      . Calcium Carbonate-Vitamin D (CALCIUM + D PO) Take 1 tablet by mouth 2 (two) times daily.     . clopidogrel (PLAVIX) 75 MG tablet Take 75 mg by mouth daily.      . fish oil-omega-3 fatty acids 1000 MG capsule Take 1 g by mouth daily.     . hydrALAZINE (APRESOLINE) 25 MG tablet Take 1 tablet (25 mg total) by mouth 3 (three) times daily. 90 tablet 1  . isosorbide mononitrate (IMDUR) 30 MG 24 hr tablet Take 30 mg by mouth daily.      Marland Kitchen levothyroxine (SYNTHROID, LEVOTHROID) 88 MCG tablet Take 1 tablet (88 mcg total) by mouth daily before breakfast. 30 tablet 6  . losartan (COZAAR)  50 MG tablet Take 1 tablet (50 mg total) by mouth 2 (two) times daily. 180 tablet 1  . metoprolol succinate (TOPROL-XL) 100 MG 24 hr tablet Take 100 mg by mouth 2 (two) times daily. Take with or immediately following a meal.    . Multiple Vitamins-Minerals (CENTRUM SILVER 50+WOMEN) TABS Take 1 tablet by mouth daily.    . Multiple Vitamins-Minerals (OCUVITE PRESERVISION PO) Take by mouth 2 (two) times daily.      . nitroGLYCERIN (NITROSTAT) 0.4 MG SL tablet Place 1 tablet (0.4 mg total) under the tongue every 5 (five) minutes as needed. 25 tablet 6  . Tetrahydrozoline-Zn Sulfate (EYE DROPS A/C OP) Place 1 drop into both eyes every 6 (six) hours as needed (for dry eyes).      No current facility-administered medications for this visit.     Past Medical History:  Diagnosis Date  . CAD, UNSPECIFIED SITE    2009. 99% proximal RCA stenosis followed by 80% distal stenosis. The LAD has a 100% stenosis in the midsegment. The circumflex is patent with distal occlusion. She has been managed medically. Her EF was well-preserved.  Marland Kitchen CAROTID STENOSIS   . DIVERTICULAR DISEASE   . Essential hypertension, benign   .  Gallstones 2009  . Gastritis   . H/O blood clots 1984   DVT and PE  . Hx of degenerative disc disease   . HYPERLIPIDEMIA   . MACULAR DEGENERATION   . OSTEOPOROSIS   . Pneumonia   . Rheumatoid arthritis(714.0)   . Thyroid cancer ([place]) 2008    Past Surgical History:  Procedure Laterality Date  . ABDOMINAL HYSTERECTOMY  1984  . APPENDECTOMY  1959  . BREAST CYST EXCISION     left  . CATARACT EXTRACTION, BILATERAL    . CHOLECYSTECTOMY  2009  . COLONOSCOPY    . ESOPHAGOGASTRODUODENOSCOPY    . IM NAILING FEMORAL SHAFT RETROGRADE  2010   left  . TONSILLECTOMY    . WRIST SURGERY  2004   Left    ROS:   As stated in the HPI and negative for all other systems.   PHYSICAL EXAM BP 138/60   Pulse 63   Ht 5\' 2"  (1.575 m)   Wt 127 lb 3.2 oz (57.7 kg)   SpO2 97%   BMI 23.27 kg/m     GENERAL:  Well appearing NECK:  No jugular venous distention, waveform within normal limits, carotid upstroke brisk and symmetric, no bruits, no thyromegaly LUNGS:  Clear to auscultation bilaterally CHEST:  Unremarkable HEART:  PMI not displaced or sustained,S1 and S2 within normal limits, no S3, no S4, no clicks, no rubs, no murmurs ABD:  Flat, positive bowel sounds normal in frequency in pitch, no bruits, no rebound, no guarding, no midline pulsatile mass, no hepatomegaly, no splenomegaly EXT:  2 plus pulses throughout, no edema, no cyanosis no clubbing NEURO:  Resting tremot   EKG:    NA    ASSESSMENT AND PLAN   CAD:     The patient has no new sypmtoms.  No further cardiovascular testing is indicated.  We will continue with aggressive risk reduction and meds as listed.  The elevated troponin was likely secondary to demand ischemia.  HTN:    She reports her blood pressures been better controlled on hydralazine.  However, she is also convinced that this has caused most of her symptoms of fatigue and increased tremors.  She wants to stop the hydralazine and we will do this.  She will keep a close by in case she needs a as needed dose.  I will increase her Norvasc to 7.5 mg daily.  We could consider using propranolol instead of metoprolol.  I also noticed that her last TSH was slightly low and I have asked her to discuss this with Dr. Forde Dandy.    HYPERLIPIDEMIA:       LDL was 65.  She will continue with meds as listed.

## 2018-10-31 DIAGNOSIS — I10 Essential (primary) hypertension: Secondary | ICD-10-CM | POA: Diagnosis not present

## 2018-10-31 DIAGNOSIS — Z6821 Body mass index (BMI) 21.0-21.9, adult: Secondary | ICD-10-CM | POA: Diagnosis not present

## 2018-10-31 DIAGNOSIS — G250 Essential tremor: Secondary | ICD-10-CM | POA: Diagnosis not present

## 2018-10-31 DIAGNOSIS — E871 Hypo-osmolality and hyponatremia: Secondary | ICD-10-CM | POA: Diagnosis not present

## 2018-10-31 NOTE — Telephone Encounter (Signed)
OV 01/28

## 2018-11-01 ENCOUNTER — Ambulatory Visit: Payer: Medicare Other | Admitting: Cardiology

## 2018-11-01 ENCOUNTER — Encounter: Payer: Self-pay | Admitting: Cardiology

## 2018-11-01 VITALS — BP 138/60 | HR 63 | Ht 62.0 in | Wt 127.2 lb

## 2018-11-01 DIAGNOSIS — I2510 Atherosclerotic heart disease of native coronary artery without angina pectoris: Secondary | ICD-10-CM

## 2018-11-01 DIAGNOSIS — R778 Other specified abnormalities of plasma proteins: Secondary | ICD-10-CM

## 2018-11-01 DIAGNOSIS — I10 Essential (primary) hypertension: Secondary | ICD-10-CM

## 2018-11-01 DIAGNOSIS — R251 Tremor, unspecified: Secondary | ICD-10-CM | POA: Diagnosis not present

## 2018-11-01 DIAGNOSIS — R7989 Other specified abnormal findings of blood chemistry: Secondary | ICD-10-CM | POA: Diagnosis not present

## 2018-11-01 MED ORDER — AMLODIPINE BESYLATE 5 MG PO TABS: 7.5000 mg | ORAL_TABLET | Freq: Every day | ORAL | 3 refills | Status: DC

## 2018-11-01 NOTE — Patient Instructions (Signed)
Medication Instructions:  INCREASE- Amlodipine 7.5 mg daily STOP- Hydralazine  If you need a refill on your cardiac medications before your next appointment, please call your pharmacy.  Labwork: None Ordered   Take the provided lab slips with you to the lab for your blood draw.   When you have your labs (blood work) drawn today and your tests are completely normal, you will receive your results only by MyChart Message (if you have MyChart) -OR-  A paper copy in the mail.  If you have any lab test that is abnormal or we need to change your treatment, we will call you to review these results.  Testing/Procedures: None Ordered  Follow-Up: . You will need a follow up appointment in 2 months with Dr Percival Spanish  At [facility], you and your health needs are our priority.  As part of our continuing mission to provide you with exceptional heart care, we have created designated Provider Care Teams.  These Care Teams include your primary Cardiologist (physician) and Advanced Practice Providers (APPs -  Physician Assistants and Nurse Practitioners) who all work together to provide you with the care you need, when you need it.  Thank you for choosing [facility] at [facility]!!

## 2018-11-14 DIAGNOSIS — I10 Essential (primary) hypertension: Secondary | ICD-10-CM | POA: Diagnosis not present

## 2018-11-14 DIAGNOSIS — Z6822 Body mass index (BMI) 22.0-22.9, adult: Secondary | ICD-10-CM | POA: Diagnosis not present

## 2018-11-14 DIAGNOSIS — E871 Hypo-osmolality and hyponatremia: Secondary | ICD-10-CM | POA: Diagnosis not present

## 2018-12-12 DIAGNOSIS — N182 Chronic kidney disease, stage 2 (mild): Secondary | ICD-10-CM | POA: Diagnosis not present

## 2018-12-12 DIAGNOSIS — E871 Hypo-osmolality and hyponatremia: Secondary | ICD-10-CM | POA: Diagnosis not present

## 2018-12-12 DIAGNOSIS — I129 Hypertensive chronic kidney disease with stage 1 through stage 4 chronic kidney disease, or unspecified chronic kidney disease: Secondary | ICD-10-CM | POA: Diagnosis not present

## 2018-12-12 DIAGNOSIS — R5383 Other fatigue: Secondary | ICD-10-CM | POA: Diagnosis not present

## 2018-12-13 ENCOUNTER — Other Ambulatory Visit: Payer: Self-pay | Admitting: Dermatology

## 2018-12-13 DIAGNOSIS — L720 Epidermal cyst: Secondary | ICD-10-CM | POA: Diagnosis not present

## 2018-12-13 DIAGNOSIS — L570 Actinic keratosis: Secondary | ICD-10-CM | POA: Diagnosis not present

## 2018-12-13 DIAGNOSIS — D0439 Carcinoma in situ of skin of other parts of face: Secondary | ICD-10-CM | POA: Diagnosis not present

## 2018-12-13 DIAGNOSIS — D229 Melanocytic nevi, unspecified: Secondary | ICD-10-CM | POA: Diagnosis not present

## 2018-12-13 DIAGNOSIS — D0462 Carcinoma in situ of skin of left upper limb, including shoulder: Secondary | ICD-10-CM | POA: Diagnosis not present

## 2018-12-13 DIAGNOSIS — D485 Neoplasm of uncertain behavior of skin: Secondary | ICD-10-CM | POA: Diagnosis not present

## 2018-12-29 ENCOUNTER — Other Ambulatory Visit: Payer: Self-pay

## 2018-12-30 ENCOUNTER — Ambulatory Visit (INDEPENDENT_AMBULATORY_CARE_PROVIDER_SITE_OTHER): Payer: Medicare Other | Admitting: Cardiology

## 2018-12-30 ENCOUNTER — Encounter: Payer: Self-pay | Admitting: Cardiology

## 2018-12-30 VITALS — BP 133/68 | HR 56 | Ht 62.0 in | Wt 128.0 lb

## 2018-12-30 DIAGNOSIS — I10 Essential (primary) hypertension: Secondary | ICD-10-CM | POA: Diagnosis not present

## 2018-12-30 DIAGNOSIS — R251 Tremor, unspecified: Secondary | ICD-10-CM | POA: Diagnosis not present

## 2018-12-30 DIAGNOSIS — I2510 Atherosclerotic heart disease of native coronary artery without angina pectoris: Secondary | ICD-10-CM | POA: Diagnosis not present

## 2018-12-30 DIAGNOSIS — E785 Hyperlipidemia, unspecified: Secondary | ICD-10-CM

## 2018-12-30 NOTE — Progress Notes (Signed)
Virtual Visit via Video Note    Evaluation Performed:  Follow-up visit  This visit type was conducted due to national recommendations for restrictions regarding the COVID-19 Pandemic (e.g. social distancing).  This format is felt to be most appropriate for this patient at this time.  All issues noted in this document were discussed and addressed.  No physical exam was performed (except for noted visual exam findings with Video Visits).  Please refer to the patient's chart (MyChart message for video visits and phone note for telephone visits) for the patient's consent to telehealth for [facility].  Date:  12/30/2018   ID:  [name], DOB 1936/09/08, MRN [id number]  Patient Location:  [place], [place] [address]    Provider location:   [facility]  PCP:  Reynold Bowen, MD  Cardiologist:  Minus Breeding, MD  Electrophysiologist:  None   Chief Complaint:  Tremor and weaknes  History of Present Illness:    [name] is a 83 y.o. adult who presents via audio/video conferencing for a telehealth visit today.    The patient was admitted to the hospital in December with bronchitis.  She had an elevated troponin which was thought to be demand ischemia.  She had a low sodium.  At the last visit she was very tremulous and weak and she thought for sure this was related to the hydralazine.  We discontinued this and I increased her Norvasc.  She says she feels much better.  Her sodium is much improved.  She denies any chest pressure, neck or arm discomfort.  She is not having the weakness and tremulousness that she was having.  She was just going to start going back to the gym when the virus started.  However, she is now looking for things to do at home.  She cannot go walking outside because she is got allergies.  She denies any new shortness of breath, PND or orthopnea.  She had no palpitations, presyncope or syncope.  The patient does not symptoms concerning for  COVID-19 infection (fever, chills, cough, or new SHORTNESS OF BREATH).    Prior CV studies:   The following studies were reviewed today:  Previous hospital records.   Past Medical History:  Diagnosis Date  . CAD, UNSPECIFIED SITE    2009. 99% proximal RCA stenosis followed by 80% distal stenosis. The LAD has a 100% stenosis in the midsegment. The circumflex is patent with distal occlusion. She has been managed medically. Her EF was well-preserved.  Marland Kitchen CAROTID STENOSIS   . DIVERTICULAR DISEASE   . Essential hypertension, benign   . Gallstones 2009  . Gastritis   . H/O blood clots 1984   DVT and PE  . Hx of degenerative disc disease   . HYPERLIPIDEMIA   . MACULAR DEGENERATION   . OSTEOPOROSIS   . Pneumonia   . Rheumatoid arthritis(714.0)   . Thyroid cancer ([place]) 2008   Past Surgical History:  Procedure Laterality Date  . ABDOMINAL HYSTERECTOMY  1984  . APPENDECTOMY  1959  . BREAST CYST EXCISION     left  . CATARACT EXTRACTION, BILATERAL    . CHOLECYSTECTOMY  2009  . COLONOSCOPY    . ESOPHAGOGASTRODUODENOSCOPY    . IM NAILING FEMORAL SHAFT RETROGRADE  2010   left  . TONSILLECTOMY    . WRIST SURGERY  2004   Left     Current Meds  Medication Sig  . Acetaminophen (TYLENOL PO) Take 500 mg by mouth every  6 (six) hours as needed (pain).   Marland Kitchen amLODipine (NORVASC) 5 MG tablet Take 1.5 tablets (7.5 mg total) by mouth daily.  Marland Kitchen aspirin 81 MG tablet Take 81 mg by mouth daily.    Marland Kitchen atorvastatin (LIPITOR) 40 MG tablet Take 40 mg by mouth daily.    . Calcium Carbonate-Vitamin D (CALCIUM + D PO) Take 1 tablet by mouth 2 (two) times daily.   . clopidogrel (PLAVIX) 75 MG tablet Take 75 mg by mouth daily.    . fish oil-omega-3 fatty acids 1000 MG capsule Take 1,200 g by mouth daily.   . isosorbide mononitrate (IMDUR) 30 MG 24 hr tablet Take 30 mg by mouth daily.    Marland Kitchen levothyroxine (SYNTHROID, LEVOTHROID) 88 MCG tablet Take 1 tablet (88 mcg total) by mouth daily before breakfast.  .  losartan (COZAAR) 50 MG tablet Take 1 tablet (50 mg total) by mouth 2 (two) times daily.  . metoprolol succinate (TOPROL-XL) 100 MG 24 hr tablet Take 100 mg by mouth 2 (two) times daily. Take with or immediately following a meal.  . Multiple Vitamins-Minerals (CENTRUM SILVER 50+WOMEN) TABS Take 1 tablet by mouth daily.  . Multiple Vitamins-Minerals (OCUVITE PRESERVISION PO) Take by mouth 2 (two) times daily.    . nitroGLYCERIN (NITROSTAT) 0.4 MG SL tablet Place 1 tablet (0.4 mg total) under the tongue every 5 (five) minutes as needed.  Bethann Humble Sulfate (EYE DROPS A/C OP) Place 1 drop into both eyes every 6 (six) hours as needed (for dry eyes).      Allergies:   Hydrocodone and Prednisone   Social History   Tobacco Use  . Smoking status: Never Smoker  . Smokeless tobacco: Never Used  Substance Use Topics  . Alcohol use: No  . Drug use: No     Family Hx: The patient's family history includes CAD in his sister; Clotting disorder in his mother; Diabetes in his father and niece; Heart attack in his mother; Heart disease in his father; Lung cancer in his brother and maternal uncle; Ovarian cancer in his maternal aunt.  ROS:   Please see the history of present illness.    As stated in the HPI and negative for all other systems.   Labs/Other Tests and Data Reviewed:    Recent Labs: 09/25/2018: B Natriuretic Peptide 307.5 09/26/2018: ALT 22; TSH 0.349 09/28/2018: BUN 21; Creatinine, Ser 0.85; Potassium 5.0; Sodium 129 10/10/2018: Hemoglobin 12.4; Platelets 326.0   Recent Lipid Panel Lab Results  Component Value Date/Time   CHOL 157 07/04/2015 08:56 AM   TRIG 84 07/04/2015 08:56 AM   HDL 79 07/04/2015 08:56 AM   CHOLHDL 2.0 07/04/2015 08:56 AM   LDLCALC 61 07/04/2015 08:56 AM    Wt Readings from Last 3 Encounters:  12/30/18 128 lb (58.1 kg)  11/01/18 127 lb 3.2 oz (57.7 kg)  10/11/18 129 lb 6.4 oz (58.7 kg)     Exam:    Vital Signs:  BP 133/68   Pulse (!) 56    Ht 5\' 2"  (1.575 m)   Wt 128 lb (58.1 kg)   BMI 23.41 kg/m    Well nourished, well developed adult in no acute distress. She looked well and she had no abnormal physical findings that she wanted to show me on camera today.   ASSESSMENT & PLAN:    CAD:  The patient has no new sypmtoms.  No further cardiovascular testing is indicated.  We will continue with aggressive risk reduction and meds as listed.  HTN:    I reviewed her blood pressure diary and her blood pressure is well controlled.  She is feeling much better off the hydralazine and tolerating the increased dose of Norvasc.  No change in therapy.  HYPERLIPIDEMIA:       LDL was 65.  She will continue with meds as listed.   COVID-19 Education: The signs and symptoms of COVID-19 were discussed with the patient and how to seek care for testing (follow up with PCP or arrange E-visit).  We spent quite a bit of time talking about the symptoms and therapies and situation around this.  The importance of social distancing was discussed today.  Patient Risk:   After full review of this patients clinical status, I feel that they are at least moderate risk at this time.  Time:   Today, I have spent 20 minutes with the patient with telehealth technology discussing .     Medication Adjustments/Labs and Tests Ordered: Current medicines are reviewed at length with the patient today.  Concerns regarding medicines are outlined above.  Tests Ordered: No orders of the defined types were placed in this encounter.  Medication Changes: No orders of the defined types were placed in this encounter.   Disposition:  in 3 month(s)  Signed, Minus Breeding, MD  12/30/2018 12:28 PM    [facility]

## 2019-01-01 ENCOUNTER — Encounter: Payer: Self-pay | Admitting: Cardiology

## 2019-01-01 DIAGNOSIS — I2510 Atherosclerotic heart disease of native coronary artery without angina pectoris: Secondary | ICD-10-CM | POA: Insufficient documentation

## 2019-01-02 ENCOUNTER — Telehealth: Payer: Self-pay | Admitting: Cardiology

## 2019-01-02 ENCOUNTER — Telehealth: Payer: Medicare Other | Admitting: Cardiology

## 2019-01-02 NOTE — Telephone Encounter (Signed)
Spoke with patient and she has been taking Metoprolol Tart 100 mg twice a day. She did look back and found that it was changed when she was d/c from hospital in December. She never made the change but refill for Metoprolol Succ 100 mg twice a day sent to mail order, sent in by PCP. She did receive Succ but wants to find out from Dr Percival Spanish what she should be taking. Will forward for review

## 2019-01-02 NOTE — Telephone Encounter (Signed)
New Message   Pt c/o medication issue:  1. Name of Medication: Metoprolol Succinate Metoprolol Tartrate   2. How are you currently taking this medication (dosage and times per day)? 100 mg 2x daily   3. Are you having a reaction (difficulty breathing--STAT)? no  4. What is your medication issue? Pt says she has been getting Metoprolol Succinate but is suppose to get Metoprolol Tartrate and she is not sure why they gave her the Succinate  Please call

## 2019-01-09 DIAGNOSIS — E871 Hypo-osmolality and hyponatremia: Secondary | ICD-10-CM | POA: Diagnosis not present

## 2019-01-09 NOTE — Telephone Encounter (Signed)
Call and spoke with pt she stated he in now taking metoprolol succ, advised her to continue to take Metoprolol Succinate 100 mg twice a day. Pt voice understanding and thanks

## 2019-01-09 NOTE — Telephone Encounter (Signed)
Either is fine.  If she has succinate continue with this.

## 2019-01-16 ENCOUNTER — Ambulatory Visit: Payer: Medicare Other | Admitting: Cardiology

## 2019-02-06 DIAGNOSIS — N182 Chronic kidney disease, stage 2 (mild): Secondary | ICD-10-CM | POA: Diagnosis not present

## 2019-02-06 DIAGNOSIS — E871 Hypo-osmolality and hyponatremia: Secondary | ICD-10-CM | POA: Diagnosis not present

## 2019-02-06 DIAGNOSIS — I129 Hypertensive chronic kidney disease with stage 1 through stage 4 chronic kidney disease, or unspecified chronic kidney disease: Secondary | ICD-10-CM | POA: Diagnosis not present

## 2019-03-02 ENCOUNTER — Encounter

## 2019-03-02 ENCOUNTER — Ambulatory Visit: Payer: Medicare Other | Admitting: Neurology

## 2019-03-08 DIAGNOSIS — H0102A Squamous blepharitis right eye, upper and lower eyelids: Secondary | ICD-10-CM | POA: Diagnosis not present

## 2019-03-08 DIAGNOSIS — H35372 Puckering of macula, left eye: Secondary | ICD-10-CM | POA: Diagnosis not present

## 2019-03-08 DIAGNOSIS — H353132 Nonexudative age-related macular degeneration, bilateral, intermediate dry stage: Secondary | ICD-10-CM | POA: Diagnosis not present

## 2019-03-08 DIAGNOSIS — H0102B Squamous blepharitis left eye, upper and lower eyelids: Secondary | ICD-10-CM | POA: Diagnosis not present

## 2019-03-09 DIAGNOSIS — I129 Hypertensive chronic kidney disease with stage 1 through stage 4 chronic kidney disease, or unspecified chronic kidney disease: Secondary | ICD-10-CM | POA: Diagnosis not present

## 2019-03-13 ENCOUNTER — Ambulatory Visit: Payer: Medicare Other | Admitting: Neurology

## 2019-03-27 DIAGNOSIS — E871 Hypo-osmolality and hyponatremia: Secondary | ICD-10-CM | POA: Diagnosis not present

## 2019-03-27 DIAGNOSIS — C73 Malignant neoplasm of thyroid gland: Secondary | ICD-10-CM | POA: Diagnosis not present

## 2019-03-27 DIAGNOSIS — E890 Postprocedural hypothyroidism: Secondary | ICD-10-CM | POA: Diagnosis not present

## 2019-03-27 DIAGNOSIS — I2510 Atherosclerotic heart disease of native coronary artery without angina pectoris: Secondary | ICD-10-CM | POA: Diagnosis not present

## 2019-03-29 DIAGNOSIS — Z1231 Encounter for screening mammogram for malignant neoplasm of breast: Secondary | ICD-10-CM | POA: Diagnosis not present

## 2019-04-06 ENCOUNTER — Ambulatory Visit: Payer: Medicare Other | Admitting: Neurology

## 2019-04-10 ENCOUNTER — Ambulatory Visit: Payer: Medicare Other | Admitting: Neurology

## 2019-05-01 DIAGNOSIS — H04123 Dry eye syndrome of bilateral lacrimal glands: Secondary | ICD-10-CM | POA: Diagnosis not present

## 2019-05-01 DIAGNOSIS — H43811 Vitreous degeneration, right eye: Secondary | ICD-10-CM | POA: Diagnosis not present

## 2019-05-01 DIAGNOSIS — H353131 Nonexudative age-related macular degeneration, bilateral, early dry stage: Secondary | ICD-10-CM | POA: Diagnosis not present

## 2019-05-01 DIAGNOSIS — Z9889 Other specified postprocedural states: Secondary | ICD-10-CM | POA: Diagnosis not present

## 2019-05-02 ENCOUNTER — Other Ambulatory Visit: Payer: Self-pay | Admitting: Physician Assistant

## 2019-05-02 DIAGNOSIS — L570 Actinic keratosis: Secondary | ICD-10-CM | POA: Diagnosis not present

## 2019-05-02 DIAGNOSIS — D045 Carcinoma in situ of skin of trunk: Secondary | ICD-10-CM | POA: Diagnosis not present

## 2019-05-02 DIAGNOSIS — C44622 Squamous cell carcinoma of skin of right upper limb, including shoulder: Secondary | ICD-10-CM | POA: Diagnosis not present

## 2019-05-02 DIAGNOSIS — D485 Neoplasm of uncertain behavior of skin: Secondary | ICD-10-CM | POA: Diagnosis not present

## 2019-05-11 DIAGNOSIS — H3562 Retinal hemorrhage, left eye: Secondary | ICD-10-CM | POA: Diagnosis not present

## 2019-05-11 DIAGNOSIS — H34832 Tributary (branch) retinal vein occlusion, left eye, with macular edema: Secondary | ICD-10-CM | POA: Diagnosis not present

## 2019-05-11 DIAGNOSIS — H353131 Nonexudative age-related macular degeneration, bilateral, early dry stage: Secondary | ICD-10-CM | POA: Diagnosis not present

## 2019-05-11 DIAGNOSIS — H353212 Exudative age-related macular degeneration, right eye, with inactive choroidal neovascularization: Secondary | ICD-10-CM | POA: Diagnosis not present

## 2019-05-17 NOTE — Progress Notes (Signed)
Error

## 2019-05-18 ENCOUNTER — Encounter: Payer: Self-pay | Admitting: Cardiology

## 2019-05-18 ENCOUNTER — Telehealth (INDEPENDENT_AMBULATORY_CARE_PROVIDER_SITE_OTHER): Payer: Medicare Other | Admitting: Cardiology

## 2019-05-18 VITALS — BP 109/54 | HR 62 | Ht 63.0 in | Wt 139.0 lb

## 2019-05-18 DIAGNOSIS — I2510 Atherosclerotic heart disease of native coronary artery without angina pectoris: Secondary | ICD-10-CM | POA: Diagnosis not present

## 2019-05-18 DIAGNOSIS — E785 Hyperlipidemia, unspecified: Secondary | ICD-10-CM

## 2019-05-18 DIAGNOSIS — I10 Essential (primary) hypertension: Secondary | ICD-10-CM

## 2019-05-18 NOTE — Patient Instructions (Signed)
Medication Instructions:  Your physician recommends that you continue on your current medications as directed. Please refer to the Current Medication list given to you today.  If you need a refill on your cardiac medications before your next appointment, please call your pharmacy.   Lab work: NONE  Testing/Procedures: NONE  Follow-Up: At [facility], you and your health needs are our priority.  As part of our continuing mission to provide you with exceptional heart care, we have created designated Provider Care Teams.  These Care Teams include your primary Cardiologist (physician) and Advanced Practice Providers (APPs -  Physician Assistants and Nurse Practitioners) who all work together to provide you with the care you need, when you need it. You will need a follow up appointment in 6 months.  Please call our office 2 months in advance to schedule this appointment.  You may see James Hochrein, MD or one of the following Advanced Practice Providers on your designated Care Team:   Rhonda Barrett, PA-C Kathryn Lawrence, DNP, ANP       

## 2019-05-18 NOTE — Progress Notes (Signed)
Virtual Visit via Video Note    Evaluation Performed:  Follow-up visit  This visit type was conducted due to national recommendations for restrictions regarding the COVID-19 Pandemic (e.g. social distancing).  This format is felt to be most appropriate for this patient at this time.  All issues noted in this document were discussed and addressed.  No physical exam was performed (except for noted visual exam findings with Video Visits).  Please refer to the patient's chart (MyChart message for video visits and phone note for telephone visits) for the patient's consent to telehealth for [facility].  Date:  05/18/2019   ID:  [name], DOB September 05, 1936, MRN [id number]  Patient Location:  [place], [place] [address]    Provider location:   [facility]  PCP:  Reynold Bowen, MD  Cardiologist:  Minus Breeding, MD  Electrophysiologist:  None   Chief Complaint:  Weakness  History of Present Illness:    [name] is a 83 y.o. adult who presents via audio/video conferencing for a telehealth visit today.    The patient was admitted to the hospital in December with bronchitis.  She had an elevated troponin which was thought to be demand ischemia.  She had a low sodium.  At the last visit she was very tremulous and weak and she thought for sure this was related to the hydralazine.  We discontinued this and I increased her Norvasc.    Since I last saw her she is been doing quite well.  She denies any cardiovascular symptoms.  She says that her sodium is back up.  She is not having any weakness or dizziness.  She is having knee and eye problems.  The patient does not symptoms concerning for COVID-19 infection (fever, chills, cough, or new SHORTNESS OF BREATH).    Prior CV studies:   The following studies were reviewed today: None  Past Medical History:  Diagnosis Date  . CAD, UNSPECIFIED SITE    2009. 99% proximal RCA stenosis followed by 80% distal stenosis. The  LAD has a 100% stenosis in the midsegment. The circumflex is patent with distal occlusion. She has been managed medically. Her EF was well-preserved.  Marland Kitchen CAROTID STENOSIS   . DIVERTICULAR DISEASE   . Essential hypertension, benign   . Gallstones 2009  . Gastritis   . H/O blood clots 1984   DVT and PE  . Hx of degenerative disc disease   . HYPERLIPIDEMIA   . MACULAR DEGENERATION   . OSTEOPOROSIS   . Pneumonia   . Rheumatoid arthritis(714.0)   . Thyroid cancer ([place]) 2008   Past Surgical History:  Procedure Laterality Date  . ABDOMINAL HYSTERECTOMY  1984  . APPENDECTOMY  1959  . BREAST CYST EXCISION     left  . CATARACT EXTRACTION, BILATERAL    . CHOLECYSTECTOMY  2009  . COLONOSCOPY    . ESOPHAGOGASTRODUODENOSCOPY    . IM NAILING FEMORAL SHAFT RETROGRADE  2010   left  . TONSILLECTOMY    . WRIST SURGERY  2004   Left     Current Meds  Medication Sig  . Acetaminophen (TYLENOL PO) Take 500 mg by mouth every 6 (six) hours as needed (pain).   Marland Kitchen aspirin 81 MG tablet Take 81 mg by mouth daily.    Marland Kitchen atorvastatin (LIPITOR) 40 MG tablet Take 40 mg by mouth daily.    . Calcium Carbonate-Vitamin D (CALCIUM + D PO) Take 1 tablet by mouth 2 (two) times daily.   Marland Kitchen  clopidogrel (PLAVIX) 75 MG tablet Take 75 mg by mouth daily.    . fish oil-omega-3 fatty acids 1000 MG capsule Take 1,200 g by mouth daily.   . isosorbide mononitrate (IMDUR) 30 MG 24 hr tablet Take 30 mg by mouth daily.    Marland Kitchen levothyroxine (SYNTHROID, LEVOTHROID) 88 MCG tablet Take 1 tablet (88 mcg total) by mouth daily before breakfast.  . losartan (COZAAR) 50 MG tablet Take 1 tablet (50 mg total) by mouth 2 (two) times daily.  . metoprolol succinate (TOPROL-XL) 100 MG 24 hr tablet Take 100 mg by mouth 2 (two) times daily. Take with or immediately following a meal.  . Multiple Vitamins-Minerals (CENTRUM SILVER 50+WOMEN) TABS Take 1 tablet by mouth daily.  . Multiple Vitamins-Minerals (OCUVITE PRESERVISION PO) Take by mouth 2  (two) times daily.    . nitroGLYCERIN (NITROSTAT) 0.4 MG SL tablet Place 1 tablet (0.4 mg total) under the tongue every 5 (five) minutes as needed.  Bethann Humble Sulfate (EYE DROPS A/C OP) Place 1 drop into both eyes every 6 (six) hours as needed (for dry eyes).      Allergies:   Hydrocodone and Prednisone   Social History   Tobacco Use  . Smoking status: Never Smoker  . Smokeless tobacco: Never Used  Substance Use Topics  . Alcohol use: No  . Drug use: No     Family Hx: The patient's family history includes CAD in his sister; Clotting disorder in his mother; Diabetes in his father and niece; Heart attack in his mother; Heart disease in his father; Lung cancer in his brother and maternal uncle; Ovarian cancer in his maternal aunt.  ROS:   As stated in the HPI and negative for all other systems.       Labs/Other Tests and Data Reviewed:    Recent Labs: 09/25/2018: B Natriuretic Peptide 307.5 09/26/2018: ALT 22; TSH 0.349 09/28/2018: BUN 21; Creatinine, Ser 0.85; Potassium 5.0; Sodium 129 10/10/2018: Hemoglobin 12.4; Platelets 326.0   Recent Lipid Panel Lab Results  Component Value Date/Time   CHOL 157 07/04/2015 08:56 AM   TRIG 84 07/04/2015 08:56 AM   HDL 79 07/04/2015 08:56 AM   CHOLHDL 2.0 07/04/2015 08:56 AM   LDLCALC 61 07/04/2015 08:56 AM    Wt Readings from Last 3 Encounters:  05/18/19 139 lb (63 kg)  12/30/18 128 lb (58.1 kg)  11/01/18 127 lb 3.2 oz (57.7 kg)     Exam:    Vital Signs:  BP (!) 109/54   Pulse 62   Ht 5\' 3"  (1.6 m)   Wt 139 lb (63 kg)   BMI 24.62 kg/m   NA  ASSESSMENT & PLAN:    CAD:   The patient has no new sypmtoms.  No further cardiovascular testing is indicated.  We will continue with aggressive risk reduction and meds as listed.  HTN:    The blood pressure is at target. No change in medications is indicated. We will continue with therapeutic lifestyle changes (TLC).  HYPERLIPIDEMIA:       LDL was 65 in Dec.  No change  in therapy.   COVID-19 Education: The signs and symptoms of COVID-19 were discussed with the patient and how to seek care for testing (follow up with PCP or arrange E-visit).  We spent quite a bit of time talking about the symptoms and therapies and situation around this.  The importance of social distancing was discussed today.  Patient Risk:   After full review of this patients  clinical status, I feel that they are at least moderate risk at this time.  Time:   Today, I have spent 16 minutes with the patient with telehealth technology discussing .     Medication Adjustments/Labs and Tests Ordered: Current medicines are reviewed at length with the patient today.  Concerns regarding medicines are outlined above.  Tests Ordered: No orders of the defined types were placed in this encounter.  Medication Changes: No orders of the defined types were placed in this encounter.   Disposition:  See in the office or virtual (whatever she prefers) in six months.   Signed, Minus Breeding, MD  05/18/2019 9:48 AM     [facility]

## 2019-05-26 DIAGNOSIS — Z23 Encounter for immunization: Secondary | ICD-10-CM | POA: Diagnosis not present

## 2019-06-15 DIAGNOSIS — H34832 Tributary (branch) retinal vein occlusion, left eye, with macular edema: Secondary | ICD-10-CM | POA: Diagnosis not present

## 2019-06-15 DIAGNOSIS — H3562 Retinal hemorrhage, left eye: Secondary | ICD-10-CM | POA: Diagnosis not present

## 2019-06-26 DIAGNOSIS — I129 Hypertensive chronic kidney disease with stage 1 through stage 4 chronic kidney disease, or unspecified chronic kidney disease: Secondary | ICD-10-CM | POA: Diagnosis not present

## 2019-07-04 DIAGNOSIS — Z23 Encounter for immunization: Secondary | ICD-10-CM | POA: Diagnosis not present

## 2019-07-05 DIAGNOSIS — Z0120 Encounter for dental examination and cleaning without abnormal findings: Secondary | ICD-10-CM | POA: Diagnosis not present

## 2019-07-27 DIAGNOSIS — H353212 Exudative age-related macular degeneration, right eye, with inactive choroidal neovascularization: Secondary | ICD-10-CM | POA: Diagnosis not present

## 2019-07-27 DIAGNOSIS — H353131 Nonexudative age-related macular degeneration, bilateral, early dry stage: Secondary | ICD-10-CM | POA: Diagnosis not present

## 2019-07-27 DIAGNOSIS — H43811 Vitreous degeneration, right eye: Secondary | ICD-10-CM | POA: Diagnosis not present

## 2019-07-27 DIAGNOSIS — H34832 Tributary (branch) retinal vein occlusion, left eye, with macular edema: Secondary | ICD-10-CM | POA: Diagnosis not present

## 2019-09-14 DIAGNOSIS — H353131 Nonexudative age-related macular degeneration, bilateral, early dry stage: Secondary | ICD-10-CM | POA: Diagnosis not present

## 2019-09-14 DIAGNOSIS — H43811 Vitreous degeneration, right eye: Secondary | ICD-10-CM | POA: Diagnosis not present

## 2019-09-14 DIAGNOSIS — H34832 Tributary (branch) retinal vein occlusion, left eye, with macular edema: Secondary | ICD-10-CM | POA: Diagnosis not present

## 2019-09-15 ENCOUNTER — Encounter: Payer: Self-pay | Admitting: Cardiology

## 2019-09-15 DIAGNOSIS — I10 Essential (primary) hypertension: Secondary | ICD-10-CM | POA: Diagnosis not present

## 2019-09-15 DIAGNOSIS — E7849 Other hyperlipidemia: Secondary | ICD-10-CM | POA: Diagnosis not present

## 2019-09-15 DIAGNOSIS — M810 Age-related osteoporosis without current pathological fracture: Secondary | ICD-10-CM | POA: Diagnosis not present

## 2019-09-15 DIAGNOSIS — R82998 Other abnormal findings in urine: Secondary | ICD-10-CM | POA: Diagnosis not present

## 2019-09-22 DIAGNOSIS — C73 Malignant neoplasm of thyroid gland: Secondary | ICD-10-CM | POA: Diagnosis not present

## 2019-09-22 DIAGNOSIS — I471 Supraventricular tachycardia: Secondary | ICD-10-CM | POA: Diagnosis not present

## 2019-09-22 DIAGNOSIS — E890 Postprocedural hypothyroidism: Secondary | ICD-10-CM | POA: Diagnosis not present

## 2019-09-22 DIAGNOSIS — Z0000 Encounter for general adult medical examination without abnormal findings: Secondary | ICD-10-CM | POA: Diagnosis not present

## 2019-11-29 DIAGNOSIS — Z7189 Other specified counseling: Secondary | ICD-10-CM | POA: Insufficient documentation

## 2019-11-29 NOTE — Progress Notes (Signed)
Cardiology Office Note   Date:  11/30/2019   ID:  [name], DOB 1935/12/23, MRN [id number]  PCP:  Reynold Bowen, MD  Cardiologist:   Minus Breeding, MD   Chief Complaint  Patient presents with  . Dizziness      History of Present Illness: [name] is a 84 y.o. adult who presents for follow up of CAD.  She was admitted to the hospital in December 2019 with bronchitis.  She had an elevated troponin which was thought to be demand ischemia.  Since that saw her she has had 2 episodes of lightheadedness or feeling like the room is spinning.  She had to close her eyes and sit down.  This lasted for a brief while.  She does have occasional palpitations but these are not occurring when she is having these lightheaded episodes.  She has not had any frank syncope.  She is try to be active.  She denies any chest discomfort, neck or arm discomfort.  She has had no new shortness of breath, PND or orthopnea.  She has had no weight gain or edema.   Past Medical History:  Diagnosis Date  . CAD, UNSPECIFIED SITE    2009. 99% proximal RCA stenosis followed by 80% distal stenosis. The LAD has a 100% stenosis in the midsegment. The circumflex is patent with distal occlusion. She has been managed medically. Her EF was well-preserved.  Marland Kitchen CAROTID STENOSIS   . DIVERTICULAR DISEASE   . Essential hypertension, benign   . Gallstones 2009  . Gastritis   . H/O blood clots 1984   DVT and PE  . Hx of degenerative disc disease   . HYPERLIPIDEMIA   . MACULAR DEGENERATION   . OSTEOPOROSIS   . Pneumonia   . Rheumatoid arthritis(714.0)   . Thyroid cancer ([place]) 2008    Past Surgical History:  Procedure Laterality Date  . ABDOMINAL HYSTERECTOMY  1984  . APPENDECTOMY  1959  . BREAST CYST EXCISION     left  . CATARACT EXTRACTION, BILATERAL    . CHOLECYSTECTOMY  2009  . COLONOSCOPY    . ESOPHAGOGASTRODUODENOSCOPY    . IM NAILING FEMORAL SHAFT RETROGRADE  2010   left  . TONSILLECTOMY     . WRIST SURGERY  2004   Left     Current Outpatient Medications  Medication Sig Dispense Refill  . Acetaminophen (TYLENOL PO) Take 500 mg by mouth every 6 (six) hours as needed (pain).     Marland Kitchen aspirin 81 MG tablet Take 81 mg by mouth daily.      Marland Kitchen atorvastatin (LIPITOR) 40 MG tablet Take 40 mg by mouth daily.      . Calcium Carbonate-Vitamin D (CALCIUM + D PO) Take 1 tablet by mouth 2 (two) times daily.     . clopidogrel (PLAVIX) 75 MG tablet Take 75 mg by mouth daily.      . fish oil-omega-3 fatty acids 1000 MG capsule Take 1,200 g by mouth daily.     . isosorbide mononitrate (IMDUR) 30 MG 24 hr tablet Take 30 mg by mouth daily.      Marland Kitchen levothyroxine (SYNTHROID, LEVOTHROID) 88 MCG tablet Take 1 tablet (88 mcg total) by mouth daily before breakfast. 30 tablet 6  . losartan (COZAAR) 50 MG tablet Take 1 tablet (50 mg total) by mouth 2 (two) times daily. 180 tablet 1  . metoprolol succinate (TOPROL-XL) 100 MG 24 hr tablet Take 100 mg by mouth 2 (two) times daily.  Take with or immediately following a meal.    . Multiple Vitamins-Minerals (CENTRUM SILVER 50+WOMEN) TABS Take 1 tablet by mouth daily.    . Multiple Vitamins-Minerals (OCUVITE PRESERVISION PO) Take by mouth 2 (two) times daily.      . nitroGLYCERIN (NITROSTAT) 0.4 MG SL tablet Place 1 tablet (0.4 mg total) under the tongue every 5 (five) minutes as needed. 25 tablet 6  . Tetrahydrozoline-Zn Sulfate (EYE DROPS A/C OP) Place 1 drop into both eyes every 6 (six) hours as needed (for dry eyes).     Marland Kitchen amLODipine (NORVASC) 5 MG tablet Take 1.5 tablets (7.5 mg total) by mouth daily. 135 tablet 3   No current facility-administered medications for this visit.    Allergies:   Hydrocodone and Prednisone     ROS:  Please see the history of present illness.   Otherwise, review of systems are positive for none.   All other systems are reviewed and negative.    PHYSICAL EXAM: VS:  BP 132/74   Pulse 66   Temp 97.7 F (36.5 C)   Ht 5\' 3"   (1.6 m)   Wt 143 lb 6.4 oz (65 kg)   SpO2 96%   BMI 25.40 kg/m  , BMI Body mass index is 25.4 kg/m. GENERAL:  Well appearing NECK:  No jugular venous distention, waveform within normal limits, carotid upstroke brisk and symmetric, no bruits, no thyromegaly LUNGS:  Clear to auscultation bilaterally CHEST:  Unremarkable HEART:  PMI not displaced or sustained,S1 and S2 within normal limits, no S3, no S4, no clicks, no rubs, no murmurs ABD:  Flat, positive bowel sounds normal in frequency in pitch, no bruits, no rebound, no guarding, no midline pulsatile mass, no hepatomegaly, no splenomegaly EXT:  2 plus pulses throughout, no edema, no cyanosis no clubbin    EKG:  EKG is ordered today. The ekg ordered today demonstrates sinus rhythm, rate 66, axis within normal limits, intervals within normal limits, no acute ST-T wave changes.   Recent Labs: No results found for requested labs within last 8760 hours.    Lipid Panel    Component Value Date/Time   CHOL 157 07/04/2015 0856   TRIG 84 07/04/2015 0856   HDL 79 07/04/2015 0856   CHOLHDL 2.0 07/04/2015 0856   VLDL 17 07/04/2015 0856   LDLCALC 61 07/04/2015 0856      Wt Readings from Last 3 Encounters:  11/30/19 143 lb 6.4 oz (65 kg)  05/18/19 139 lb (63 kg)  12/30/18 128 lb (58.1 kg)      Other studies Reviewed: Additional studies/ records that were reviewed today include: Labs. Review of the above records demonstrates:  Please see elsewhere in the note.     ASSESSMENT AND PLAN:   CAD:  The patient has no new sypmtoms.  No further cardiovascular testing is indicated.  We will continue with aggressive risk reduction and meds as listed.  HTN: The blood pressure is at target.  No change in therapy.  HYPERLIPIDEMIA: LDL was  60 with HDL 63.  No change in therapy.   COVID EDUCATION:   She has had both doses of her vaccine.  DIZZINESS: She is worried about her carotids.  She has no significant plaque in 2015 but  we have not checked in a while.  I doubt that this is related to the dizziness but will go ahead and order carotid Dopplers.  She will follow up with her primary provider as this is most likely was related.  Current medicines are reviewed at length with the patient today.  The patient does not have concerns regarding medicines.  The following changes have been made:  no change  Labs/ tests ordered today include:   Orders Placed This Encounter  Procedures  . EKG 12-Lead  . VAS US CAROTID     Disposition:   FU with me in one year.     Signed, Minus Breeding, MD  11/30/2019 8:16 AM    [facility]

## 2019-11-30 ENCOUNTER — Other Ambulatory Visit: Payer: Self-pay

## 2019-11-30 ENCOUNTER — Ambulatory Visit: Payer: Medicare Other | Admitting: Cardiology

## 2019-11-30 ENCOUNTER — Encounter: Payer: Self-pay | Admitting: Cardiology

## 2019-11-30 VITALS — BP 132/74 | HR 66 | Temp 97.7°F | Ht 63.0 in | Wt 143.4 lb

## 2019-11-30 DIAGNOSIS — R110 Nausea: Secondary | ICD-10-CM | POA: Diagnosis not present

## 2019-11-30 DIAGNOSIS — I2510 Atherosclerotic heart disease of native coronary artery without angina pectoris: Secondary | ICD-10-CM | POA: Diagnosis not present

## 2019-11-30 DIAGNOSIS — Z7189 Other specified counseling: Secondary | ICD-10-CM

## 2019-11-30 DIAGNOSIS — G4733 Obstructive sleep apnea (adult) (pediatric): Secondary | ICD-10-CM | POA: Diagnosis not present

## 2019-11-30 DIAGNOSIS — E785 Hyperlipidemia, unspecified: Secondary | ICD-10-CM

## 2019-11-30 DIAGNOSIS — I10 Essential (primary) hypertension: Secondary | ICD-10-CM

## 2019-11-30 DIAGNOSIS — R42 Dizziness and giddiness: Secondary | ICD-10-CM

## 2019-11-30 NOTE — Patient Instructions (Signed)
Medication Instructions:  No changes *If you need a refill on your cardiac medications before your next appointment, please call your pharmacy*  Lab Work: None  Testing/Procedures: Your physician has requested that you have a carotid duplex. This test is an ultrasound of the carotid arteries in your neck. It looks at blood flow through these arteries that supply the brain with blood. Allow one hour for this exam. There are no restrictions or special instructions.   Follow-Up: At [facility], you and your health needs are our priority.  As part of our continuing mission to provide you with exceptional heart care, we have created designated Provider Care Teams.  These Care Teams include your primary Cardiologist (physician) and Advanced Practice Providers (APPs -  Physician Assistants and Nurse Practitioners) who all work together to provide you with the care you need, when you need it.  Your next appointment:   1 year(s)  The format for your next appointment:   In Person  Provider:   Minus Breeding, MD

## 2019-12-11 ENCOUNTER — Other Ambulatory Visit: Payer: Self-pay | Admitting: Cardiology

## 2019-12-11 DIAGNOSIS — R42 Dizziness and giddiness: Secondary | ICD-10-CM

## 2019-12-13 ENCOUNTER — Other Ambulatory Visit: Payer: Self-pay

## 2019-12-13 ENCOUNTER — Ambulatory Visit (HOSPITAL_COMMUNITY)
Admission: RE | Admit: 2019-12-13 | Discharge: 2019-12-13 | Disposition: A | Discharge status: Home / Self Care | Payer: Medicare Other | Source: Ambulatory Visit | Attending: Cardiovascular Disease | Admitting: Cardiovascular Disease

## 2019-12-13 ENCOUNTER — Telehealth: Payer: Self-pay

## 2019-12-13 DIAGNOSIS — R42 Dizziness and giddiness: Secondary | ICD-10-CM | POA: Insufficient documentation

## 2019-12-13 MED ORDER — AMLODIPINE BESYLATE 5 MG PO TABS: 7.5000 mg | ORAL_TABLET | Freq: Every day | ORAL | 3 refills | Status: DC

## 2019-12-13 NOTE — Telephone Encounter (Signed)
Pt here for carotid US and told teck that she needs a redill Refill sent

## 2019-12-14 DIAGNOSIS — H353131 Nonexudative age-related macular degeneration, bilateral, early dry stage: Secondary | ICD-10-CM | POA: Diagnosis not present

## 2019-12-14 DIAGNOSIS — H34832 Tributary (branch) retinal vein occlusion, left eye, with macular edema: Secondary | ICD-10-CM | POA: Diagnosis not present

## 2019-12-14 DIAGNOSIS — H04123 Dry eye syndrome of bilateral lacrimal glands: Secondary | ICD-10-CM | POA: Diagnosis not present

## 2019-12-14 DIAGNOSIS — H43811 Vitreous degeneration, right eye: Secondary | ICD-10-CM | POA: Diagnosis not present

## 2020-03-12 DIAGNOSIS — H1045 Other chronic allergic conjunctivitis: Secondary | ICD-10-CM | POA: Diagnosis not present

## 2020-03-12 DIAGNOSIS — H353132 Nonexudative age-related macular degeneration, bilateral, intermediate dry stage: Secondary | ICD-10-CM | POA: Diagnosis not present

## 2020-03-12 DIAGNOSIS — H00025 Hordeolum internum left lower eyelid: Secondary | ICD-10-CM | POA: Diagnosis not present

## 2020-03-12 DIAGNOSIS — H348322 Tributary (branch) retinal vein occlusion, left eye, stable: Secondary | ICD-10-CM | POA: Diagnosis not present

## 2020-03-21 DIAGNOSIS — H00025 Hordeolum internum left lower eyelid: Secondary | ICD-10-CM | POA: Diagnosis not present

## 2020-03-22 DIAGNOSIS — E890 Postprocedural hypothyroidism: Secondary | ICD-10-CM | POA: Diagnosis not present

## 2020-03-22 DIAGNOSIS — C73 Malignant neoplasm of thyroid gland: Secondary | ICD-10-CM | POA: Diagnosis not present

## 2020-03-22 DIAGNOSIS — H348122 Central retinal vein occlusion, left eye, stable: Secondary | ICD-10-CM | POA: Diagnosis not present

## 2020-03-22 DIAGNOSIS — M25561 Pain in right knee: Secondary | ICD-10-CM | POA: Diagnosis not present

## 2020-03-22 DIAGNOSIS — E871 Hypo-osmolality and hyponatremia: Secondary | ICD-10-CM | POA: Diagnosis not present

## 2020-03-28 ENCOUNTER — Ambulatory Visit: Payer: Self-pay

## 2020-03-28 ENCOUNTER — Ambulatory Visit (INDEPENDENT_AMBULATORY_CARE_PROVIDER_SITE_OTHER): Payer: Medicare Other | Admitting: Orthopaedic Surgery

## 2020-03-28 ENCOUNTER — Encounter: Payer: Self-pay | Admitting: Orthopaedic Surgery

## 2020-03-28 ENCOUNTER — Telehealth: Payer: Self-pay

## 2020-03-28 ENCOUNTER — Other Ambulatory Visit: Payer: Self-pay

## 2020-03-28 DIAGNOSIS — M25561 Pain in right knee: Secondary | ICD-10-CM | POA: Diagnosis not present

## 2020-03-28 DIAGNOSIS — M1711 Unilateral primary osteoarthritis, right knee: Secondary | ICD-10-CM | POA: Diagnosis not present

## 2020-03-28 MED ORDER — METHYLPREDNISOLONE ACETATE 40 MG/ML IJ SUSP
40.0000 mg | INTRAMUSCULAR | Status: AC | PRN
  Administered 2020-03-28: 40 mg via INTRA_ARTICULAR

## 2020-03-28 MED ORDER — LIDOCAINE HCL 1 % IJ SOLN
3.0000 mL | INTRAMUSCULAR | Status: AC | PRN
  Administered 2020-03-28: 3 mL

## 2020-03-28 NOTE — Telephone Encounter (Signed)
Right knee gel injections 

## 2020-03-28 NOTE — Progress Notes (Signed)
Office Visit Note   Patient: [name]           Date of Birth: May 22, 1936           MRN: [id number] Visit Date: 03/28/2020              Requested by: Reynold Bowen, MD [place],  [place] [address] PCP: Reynold Bowen, MD   Assessment & Plan: Visit Diagnoses:  1. Right knee pain, unspecified chronicity   2. Unilateral primary osteoarthritis, right knee     Plan: She is certainly having some type of inflammation within her right knee.  This is still likely arthritic in nature.  I offered her steroid injection in her right knee today and explained the rationale behind this as well as the risk and benefits and she agrees with this.  She does have some arthritic changes in the knee so I do feel that she is a perfect candidate for hyaluronic acid.  Of also recommended topical Voltaren gel.  All questions and concerns were answered and addressed.  We will see her back in 4 weeks to hopefully place hyaluronic acid into the right knee.  She can resume her treadmill tomorrow.  Follow-Up Instructions: Return in about 4 weeks (around 04/25/2020).   Orders:  Orders Placed This Encounter  Procedures  . Large Joint Inj  . XR Knee 1-2 Views Right   No orders of the defined types were placed in this encounter.     Procedures: Large Joint Inj: R knee on 03/28/2020 3:46 PM Indications: diagnostic evaluation and pain Details: 22 G 1.5 in needle, superolateral approach  Arthrogram: No  Medications: 3 mL lidocaine 1 %; 40 mg methylPREDNISolone acetate 40 MG/ML Outcome: tolerated well, no immediate complications Procedure, treatment alternatives, risks and benefits explained, specific risks discussed. Consent was given by the patient. Immediately prior to procedure a time out was called to verify the correct patient, procedure, equipment, support staff and site/side marked as required. Patient was prepped and draped in the usual sterile fashion.       Clinical Data: No  additional findings.   Subjective: Chief Complaint  Patient presents with  . Right Knee - Pain  The patient is a very pleasant and active 84 years old who comes in with a several year history of right knee pain.  Is been getting worse than for the last 2 to 3 months.  She has had no known injury.  The knee does not really swell but it really hurts bad when she is in bed and painful with activities.  She does get on the treadmill regularly.  She has never had surgery on that right knee.  She is never injured it.  She has had no acute change in her medical status.  She points to the medial joint line as the source of her pain with her right knee.  HPI  Review of Systems She currently denies any headache, chest pain, shortness of breath, fever, chills, nausea, vomiting  Objective: Vital Signs: There were no vitals taken for this visit.  Physical Exam She is alert and orient x3 and in no acute distress Ortho Exam Examination of her right knee shows full range of motion no effusion.  The knee is ligamentously stable but painful along the medial joint line and over the pes bursa area. Specialty Comments:  No specialty comments available.  Imaging: XR Knee 1-2 Views Right  Result Date: 03/28/2020 2 views of the right knee show  no acute findings.  The joint space is still well-maintained and the alignment is well-maintained.    PMFS History: Patient Active Problem List   Diagnosis Date Noted  . Dizziness 11/30/2019  . Educated about COVID-19 virus infection 11/29/2019  . Dyslipidemia 05/18/2019  . Coronary artery disease involving native coronary artery of native heart without angina pectoris 01/01/2019  . Tremor 11/01/2018  . Shortness of breath 09/25/2018  . Hypothyroidism 10/30/2013  . TIA (transient ischemic attack)- remote 10/30/2013  . Leukopenia 10/29/2013  . Elevated troponin- 0.58 in setting of PSVT 10/29/2013  . Community acquired pneumonia 10/28/2013  . Dyspnea  10/28/2013  . PSVT (paroxysmal supraventricular tachycardia) ([place]) 10/28/2013  . Personal history of pulmonary embolism- postop in the 80's 10/28/2013  . Hyponatremia 10/28/2013  . Osteoporosis   . Essential hypertension, benign 07/11/2009  . HYPERLIPIDEMIA 01/10/2009  . CAROTID STENOSIS- moderate 01/10/2009  . MACULAR DEGENERATION 01/09/2009  . CAD- severe LAD and RCA disease with collaterals from CFX Sept 2009. Medical Rx 01/09/2009  . DIVERTICULAR DISEASE 01/09/2009  . Rheumatoid arthritis(714.0) 01/09/2009  . OSTEOPOROSIS 01/09/2009   Past Medical History:  Diagnosis Date  . CAD, UNSPECIFIED SITE    2009. 99% proximal RCA stenosis followed by 80% distal stenosis. The LAD has a 100% stenosis in the midsegment. The circumflex is patent with distal occlusion. She has been managed medically. Her EF was well-preserved.  Marland Kitchen CAROTID STENOSIS   . DIVERTICULAR DISEASE   . Essential hypertension, benign   . Gallstones 2009  . Gastritis   . H/O blood clots 1984   DVT and PE  . Hx of degenerative disc disease   . HYPERLIPIDEMIA   . MACULAR DEGENERATION   . OSTEOPOROSIS   . Pneumonia   . Rheumatoid arthritis(714.0)   . Thyroid cancer ([place]) 2008    Family History  Problem Relation Age of Onset  . Heart attack Mother   . Clotting disorder Mother   . Heart disease Father   . Diabetes Father   . Ovarian cancer Maternal Aunt   . Lung cancer Brother   . CAD Sister   . Diabetes Niece   . Lung cancer Maternal Uncle     Past Surgical History:  Procedure Laterality Date  . ABDOMINAL HYSTERECTOMY  1984  . APPENDECTOMY  1959  . BREAST CYST EXCISION     left  . CATARACT EXTRACTION, BILATERAL    . CHOLECYSTECTOMY  2009  . COLONOSCOPY    . ESOPHAGOGASTRODUODENOSCOPY    . IM NAILING FEMORAL SHAFT RETROGRADE  2010   left  . TONSILLECTOMY    . WRIST SURGERY  2004   Left   Social History   Occupational History  . Occupation: retired    Comment: Network engineer  Tobacco Use  . Smoking  status: Never Smoker  . Smokeless tobacco: Never Used  Vaping Use  . Vaping Use: Never used  Substance and Sexual Activity  . Alcohol use: No  . Drug use: No  . Sexual activity: Not on file

## 2020-03-29 NOTE — Telephone Encounter (Signed)
Noted  

## 2020-04-03 DIAGNOSIS — Z1231 Encounter for screening mammogram for malignant neoplasm of breast: Secondary | ICD-10-CM | POA: Diagnosis not present

## 2020-04-10 ENCOUNTER — Telehealth: Payer: Self-pay

## 2020-04-10 NOTE — Telephone Encounter (Signed)
Submitted VOB, SynviscOne, right knee. 

## 2020-04-12 ENCOUNTER — Telehealth: Payer: Self-pay

## 2020-04-12 NOTE — Telephone Encounter (Signed)
Approved, SynviscOne, right knee. [place] Patient will be responsible for 20% OOP. No Co-pay No PA required

## 2020-04-15 ENCOUNTER — Encounter (INDEPENDENT_AMBULATORY_CARE_PROVIDER_SITE_OTHER): Payer: Medicare Other | Admitting: Ophthalmology

## 2020-04-16 ENCOUNTER — Encounter (INDEPENDENT_AMBULATORY_CARE_PROVIDER_SITE_OTHER): Payer: Self-pay | Admitting: Ophthalmology

## 2020-04-16 ENCOUNTER — Ambulatory Visit (INDEPENDENT_AMBULATORY_CARE_PROVIDER_SITE_OTHER): Payer: Medicare Other | Admitting: Ophthalmology

## 2020-04-16 ENCOUNTER — Other Ambulatory Visit: Payer: Self-pay

## 2020-04-16 DIAGNOSIS — H348312 Tributary (branch) retinal vein occlusion, right eye, stable: Secondary | ICD-10-CM | POA: Insufficient documentation

## 2020-04-16 DIAGNOSIS — H353131 Nonexudative age-related macular degeneration, bilateral, early dry stage: Secondary | ICD-10-CM | POA: Diagnosis not present

## 2020-04-16 DIAGNOSIS — H34832 Tributary (branch) retinal vein occlusion, left eye, with macular edema: Secondary | ICD-10-CM | POA: Diagnosis not present

## 2020-04-16 DIAGNOSIS — H04123 Dry eye syndrome of bilateral lacrimal glands: Secondary | ICD-10-CM | POA: Diagnosis not present

## 2020-04-16 DIAGNOSIS — H353132 Nonexudative age-related macular degeneration, bilateral, intermediate dry stage: Secondary | ICD-10-CM | POA: Insufficient documentation

## 2020-04-16 MED ORDER — FLUORESCEIN SODIUM 10 % IV SOLN
500.0000 mg | INTRAVENOUS | Status: AC | PRN
  Administered 2020-04-16: 500 mg via INTRAVENOUS

## 2020-04-16 NOTE — Assessment & Plan Note (Signed)
About 4 months status post recent intravitreal Avastin, CME OS is stable yet there is mild thickening on OCT.  Patient also complains of mild distortion.  Will need fluorescein angiography to establish new baseline so that we can observe knowing what her current functioning and perfusion status of the macula is

## 2020-04-16 NOTE — Progress Notes (Signed)
04/16/2020     CHIEF COMPLAINT Patient presents for Retina Follow Up   HISTORY OF PRESENT ILLNESS: [name] is a 84 y.o. adult who presents to the clinic today for:   HPI    Retina Follow Up    Patient presents with  CRVO/BRVO.  In both eyes.  Duration of 4 months.  Since onset it is stable.          Comments    4 month follow up - OCT OU Patient states that overall her vision has been stable but c/o seeing a curve in the left eye.       Last edited by Gerda Diss on 04/16/2020  8:50 AM. (History)      Referring physician: Reynold Bowen, MD [place],  [place] [address]  HISTORICAL INFORMATION:   Selected notes from the MEDICAL RECORD NUMBER       CURRENT MEDICATIONS: Current Outpatient Medications (Ophthalmic Drugs)  Medication Sig  . Tetrahydrozoline-Zn Sulfate (EYE DROPS A/C OP) Place 1 drop into both eyes every 6 (six) hours as needed (for dry eyes).    No current facility-administered medications for this visit. (Ophthalmic Drugs)   Current Outpatient Medications (Other)  Medication Sig  . Acetaminophen (TYLENOL PO) Take 500 mg by mouth every 6 (six) hours as needed (pain).   Marland Kitchen amLODipine (NORVASC) 5 MG tablet Take 1.5 tablets (7.5 mg total) by mouth daily.  Marland Kitchen aspirin 81 MG tablet Take 81 mg by mouth daily.    Marland Kitchen atorvastatin (LIPITOR) 40 MG tablet Take 40 mg by mouth daily.    . Calcium Carbonate-Vitamin D (CALCIUM + D PO) Take 1 tablet by mouth 2 (two) times daily.   . clopidogrel (PLAVIX) 75 MG tablet Take 75 mg by mouth daily.    . fish oil-omega-3 fatty acids 1000 MG capsule Take 1,200 g by mouth daily.   . isosorbide mononitrate (IMDUR) 30 MG 24 hr tablet Take 30 mg by mouth daily.    Marland Kitchen levothyroxine (SYNTHROID, LEVOTHROID) 88 MCG tablet Take 1 tablet (88 mcg total) by mouth daily before breakfast.  . losartan (COZAAR) 50 MG tablet Take 1 tablet (50 mg total) by mouth 2 (two) times daily.  . metoprolol succinate (TOPROL-XL) 100 MG  24 hr tablet Take 100 mg by mouth 2 (two) times daily. Take with or immediately following a meal.  . Multiple Vitamins-Minerals (CENTRUM SILVER 50+WOMEN) TABS Take 1 tablet by mouth daily.  . Multiple Vitamins-Minerals (OCUVITE PRESERVISION PO) Take by mouth 2 (two) times daily.    . nitroGLYCERIN (NITROSTAT) 0.4 MG SL tablet Place 1 tablet (0.4 mg total) under the tongue every 5 (five) minutes as needed.   No current facility-administered medications for this visit. (Other)      REVIEW OF SYSTEMS:    ALLERGIES Allergies  Allergen Reactions  . Hydrocodone Other (See Comments)    "feels weird"  . Prednisone Nausea And Vomiting    Oral    PAST MEDICAL HISTORY Past Medical History:  Diagnosis Date  . CAD, UNSPECIFIED SITE    2009. 99% proximal RCA stenosis followed by 80% distal stenosis. The LAD has a 100% stenosis in the midsegment. The circumflex is patent with distal occlusion. She has been managed medically. Her EF was well-preserved.  Marland Kitchen CAROTID STENOSIS   . DIVERTICULAR DISEASE   . Essential hypertension, benign   . Gallstones 2009  . Gastritis   . H/O blood clots 1984   DVT and PE  .  Hx of degenerative disc disease   . HYPERLIPIDEMIA   . MACULAR DEGENERATION   . OSTEOPOROSIS   . Pneumonia   . Rheumatoid arthritis(714.0)   . Thyroid cancer ([place]) 2008   Past Surgical History:  Procedure Laterality Date  . ABDOMINAL HYSTERECTOMY  1984  . APPENDECTOMY  1959  . BREAST CYST EXCISION     left  . CATARACT EXTRACTION, BILATERAL    . CHOLECYSTECTOMY  2009  . COLONOSCOPY    . ESOPHAGOGASTRODUODENOSCOPY    . IM NAILING FEMORAL SHAFT RETROGRADE  2010   left  . TONSILLECTOMY    . WRIST SURGERY  2004   Left    FAMILY HISTORY Family History  Problem Relation Age of Onset  . Heart attack Mother   . Clotting disorder Mother   . Heart disease Father   . Diabetes Father   . Ovarian cancer Maternal Aunt   . Lung cancer Brother   . CAD Sister   . Diabetes Niece     . Lung cancer Maternal Uncle     SOCIAL HISTORY Social History   Tobacco Use  . Smoking status: Never Smoker  . Smokeless tobacco: Never Used  Vaping Use  . Vaping Use: Never used  Substance Use Topics  . Alcohol use: No  . Drug use: No         OPHTHALMIC EXAM:  Base Eye Exam    Visual Acuity (Snellen - Linear)      Right Left   Dist cc 20/30-1 20/30-2   Dist ph cc NI NI   Correction: Glasses       Tonometry (Tonopen, 8:55 AM)      Right Left   Pressure 16 15       Pupils      Pupils Dark Light Shape React APD   Right PERRL 4 3 Round Slow None   Left PERRL 4 3 Round Slow None       Visual Fields (Counting fingers)      Left Right    Full Full       Extraocular Movement      Right Left    Full Full       Neuro/Psych    Oriented x3: Yes   Mood/Affect: Normal       Dilation    Both eyes: 2.5% Phenylephrine, 1.0% Mydriacyl @ 8:55 AM        Slit Lamp and Fundus Exam    External Exam      Right Left   External Normal Normal       Slit Lamp Exam      Right Left   Lids/Lashes Normal Normal   Conjunctiva/Sclera White and quiet White and quiet   Cornea Clear Clear   Anterior Chamber Deep and quiet Deep and quiet   Iris Round and reactive Round and reactive   Lens Posterior chamber intraocular lens Posterior chamber intraocular lens   Anterior Vitreous Normal Normal       Fundus Exam      Right Left   Disc Normal Normal   C/D Ratio 0.4 0.45   Macula Hard drusen, no macular thickening Microaneurysms, no macular thickening, Hard drusen   Vessels Normal old brvo inf macula   Periphery Normal Normal          IMAGING AND PROCEDURES  Imaging and Procedures for 04/16/20  OCT, Retina - OU - Both Eyes       Right Eye Quality was good. Scan locations  included subfoveal. Central Foveal Thickness: 279. Progression has been stable.   Left Eye Quality was good. Scan locations included subfoveal. Central Foveal Thickness: 359. Progression  has improved.   Notes Minor thickening left eye persists yet overall improved now 4 months status post intravitreal Avastin for branch retinal vein occlusion.  Stable       Color Fundus Photography Optos - OU - Both Eyes       Right Eye Progression has been stable. Disc findings include normal observations. Macula : drusen, geographic atrophy, retinal pigment epithelium abnormalities.   Left Eye Progression has been stable. Disc findings include normal observations. Macula : drusen, retinal pigment epithelium abnormalities, microaneurysms.   Notes OD with areas of geographic atrophy, hard drusen, no active CNVM.  Posterior vitreous detachment noted incidentally.  OS, old branch retinal vein occlusion, no evidence of collateralization.  Good clear view of the       Fluorescein Angiography Optos (Transit OS)       Injection:  500 mg Fluorescein Sodium 10 % injection   NDC: [phone]   Route: IntravenousRight Eye   Progression has been stable. Mid/Late phase findings include normal observations, staining, window defect. Choroidal neovascularization is not present.   Left Eye   Progression has been stable. Early phase findings include normal observations. Mid/Late phase findings include normal observations, window defect. Choroidal neovascularization is not present.   Notes OS transit views with normal AV transit, no late leakage, no active CME, no evidence of collateralization, resolved BRVO with no regions of nonperfusion                  ASSESSMENT/PLAN:  Branch retinal vein occlusion with macular edema of left eye About 4 months status post recent intravitreal Avastin, CME OS is stable yet there is mild thickening on OCT.  Patient also complains of mild distortion.  Will need fluorescein angiography to establish new baseline so that we can observe knowing what her current functioning and perfusion status of the macula is      ICD-10-CM   1. Branch  retinal vein occlusion with macular edema of left eye  H34.8320 OCT, Retina - OU - Both Eyes    Color Fundus Photography Optos - OU - Both Eyes    Fluorescein Angiography Optos (Transit OS)    Fluorescein Sodium 10 % injection 500 mg  2. Dry eyes  H04.123   3. Early stage nonexudative age-related macular degeneration of both eyes  H35.3131 OCT, Retina - OU - Both Eyes    Color Fundus Photography Optos - OU - Both Eyes    1.  2.  3.  Ophthalmic Meds Ordered this visit:  Meds ordered this encounter  Medications  . Fluorescein Sodium 10 % injection 500 mg       Return in about 4 months (around 08/17/2020) for DILATE OU, OCT.  There are no Patient Instructions on file for this visit.   Explained the diagnoses, plan, and follow up with the patient and they expressed understanding.  Patient expressed understanding of the importance of proper follow up care.   Clent Demark Kristiann Noyce M.D. Diseases & Surgery of the Retina and Vitreous [facility] [place] 04/16/20     Abbreviations: M myopia (nearsighted); A astigmatism; H hyperopia (farsighted); P presbyopia; Mrx spectacle prescription;  CTL contact lenses; OD right eye; OS left eye; OU both eyes  XT exotropia; ET esotropia; PEK punctate epithelial keratitis; PEE punctate epithelial erosions; DES dry eye syndrome; MGD meibomian gland  dysfunction; ATs artificial tears; PFAT's preservative free artificial tears; [place] nuclear sclerotic cataract; PSC posterior subcapsular cataract; ERM epi-retinal membrane; PVD posterior vitreous detachment; RD retinal detachment; DM diabetes mellitus; DR diabetic retinopathy; NPDR non-proliferative diabetic retinopathy; PDR proliferative diabetic retinopathy; CSME clinically significant macular edema; DME diabetic macular edema; dbh dot blot hemorrhages; CWS cotton wool spot; POAG primary open angle glaucoma; C/D cup-to-disc ratio; HVF humphrey visual field; GVF goldmann visual field; OCT optical coherence  tomography; IOP intraocular pressure; BRVO Branch retinal vein occlusion; CRVO central retinal vein occlusion; CRAO central retinal artery occlusion; BRAO branch retinal artery occlusion; RT retinal tear; SB scleral buckle; PPV pars plana vitrectomy; VH Vitreous hemorrhage; PRP panretinal laser photocoagulation; IVK intravitreal kenalog; VMT vitreomacular traction; MH Macular hole;  NVD neovascularization of the disc; NVE neovascularization elsewhere; AREDS age related eye disease study; ARMD age related macular degeneration; POAG primary open angle glaucoma; EBMD epithelial/anterior basement membrane dystrophy; ACIOL anterior chamber intraocular lens; IOL intraocular lens; PCIOL posterior chamber intraocular lens; Phaco/IOL phacoemulsification with intraocular lens placement; [place] photorefractive keratectomy; LASIK laser assisted in situ keratomileusis; HTN hypertension; DM diabetes mellitus; COPD chronic obstructive pulmonary disease

## 2020-05-06 ENCOUNTER — Ambulatory Visit (INDEPENDENT_AMBULATORY_CARE_PROVIDER_SITE_OTHER): Payer: Medicare Other | Admitting: Orthopaedic Surgery

## 2020-05-06 ENCOUNTER — Other Ambulatory Visit: Payer: Self-pay

## 2020-05-06 ENCOUNTER — Encounter: Payer: Self-pay | Admitting: Orthopaedic Surgery

## 2020-05-06 VITALS — Ht 63.0 in | Wt 143.0 lb

## 2020-05-06 DIAGNOSIS — M1711 Unilateral primary osteoarthritis, right knee: Secondary | ICD-10-CM

## 2020-05-06 MED ORDER — HYLAN G-F 20 48 MG/6ML IX SOSY
48.0000 mg | PREFILLED_SYRINGE | INTRA_ARTICULAR | Status: AC | PRN
  Administered 2020-05-06: 48 mg via INTRA_ARTICULAR

## 2020-05-06 MED ORDER — TIZANIDINE HCL 4 MG PO TABS
4.0000 mg | ORAL_TABLET | Freq: Three times a day (TID) | ORAL | 0 refills | Status: DC | PRN
Start: 2020-05-06 — End: 2020-11-29

## 2020-05-06 MED ORDER — TRAMADOL HCL 50 MG PO TABS: 50.0000 mg | ORAL_TABLET | Freq: Four times a day (QID) | ORAL | 0 refills | Status: DC | PRN

## 2020-05-06 NOTE — Progress Notes (Signed)
   Procedure Note  Patient: [name]             Date of Birth: [birth year]           MRN: [id number]             Visit Date: 05/06/2020  Procedures: Visit Diagnoses:  1. Unilateral primary osteoarthritis, right knee     Large Joint Inj: R knee on 05/06/2020 1:51 PM Indications: pain and diagnostic evaluation Details: 22 G 1.5 in needle, superolateral approach  Arthrogram: No  Medications: 48 mg Hylan 48 MG/6ML Outcome: tolerated well, no immediate complications Procedure, treatment alternatives, risks and benefits explained, specific risks discussed. Consent was given by the patient. Immediately prior to procedure a time out was called to verify the correct patient, procedure, equipment, support staff and site/side marked as required. Patient was prepped and draped in the usual sterile fashion.    The patient comes in today for scheduled Synvisc 1 injection into her right knee to treat the pain from osteoarthritis.  She would like for me to see her for her back today but she understands we are running so far behind in back clinic is to carotid for me to fully evaluate this.  I did talk to her about trying to send in some medications that would help.  Her right knee shows no effusion.  She understands that she is getting Synvisc 1 injection to help with the pain long-term from osteoarthritis.  She cannot take traditional anti-inflammatories.  She cannot take prednisone either because she is allergic to this.  She did tolerate Synvisc 1 easily without difficulty.  I showed her some stretching exercises to try for the back and I will have her try combination of Voltaren gel which will be safe for her as well as tramadol and Zanaflex.  We will see her back next week to evaluate her back.

## 2020-05-14 ENCOUNTER — Ambulatory Visit: Payer: Medicare Other | Admitting: Orthopaedic Surgery

## 2020-06-18 ENCOUNTER — Ambulatory Visit: Payer: Medicare Other | Admitting: Orthopaedic Surgery

## 2020-06-18 ENCOUNTER — Encounter: Payer: Self-pay | Admitting: Orthopaedic Surgery

## 2020-06-18 DIAGNOSIS — M1711 Unilateral primary osteoarthritis, right knee: Secondary | ICD-10-CM | POA: Diagnosis not present

## 2020-06-18 DIAGNOSIS — M25561 Pain in right knee: Secondary | ICD-10-CM | POA: Diagnosis not present

## 2020-06-18 DIAGNOSIS — G8929 Other chronic pain: Secondary | ICD-10-CM | POA: Diagnosis not present

## 2020-06-18 MED ORDER — LIDOCAINE HCL 1 % IJ SOLN
3.0000 mL | INTRAMUSCULAR | Status: AC | PRN
  Administered 2020-06-18: 3 mL

## 2020-06-18 MED ORDER — METHYLPREDNISOLONE ACETATE 40 MG/ML IJ SUSP
40.0000 mg | INTRAMUSCULAR | Status: AC | PRN
Start: 2020-06-18 — End: 2020-06-18
  Administered 2020-06-18: 40 mg via INTRA_ARTICULAR

## 2020-06-18 NOTE — Progress Notes (Signed)
Office Visit Note   Patient: [name]           Date of Birth: [birth year]           MRN: [id number] Visit Date: 06/18/2020              Requested by: Reynold Bowen, MD [place],  [place] [address] PCP: Reynold Bowen, MD   Assessment & Plan: Visit Diagnoses:  1. Unilateral primary osteoarthritis, right knee   2. Chronic pain of right knee     Plan: I did speak to her today about the possibility of physical therapy for her knee.  She does wish to least try a steroid injection today and would like to hold off on therapy for now.  She understands the risk and benefits of steroid injections.  She did tolerate a steroid injection well in her right knee today.  She would like to follow-up as needed.  I told her that if things do not improve that my neck step would be ordering formal physical therapy for her right knee.  She stated that she understands this.  Follow-Up Instructions: Return in about 4 weeks (around 07/16/2020), or if symptoms worsen or fail to improve.   Orders:  Orders Placed This Encounter  Procedures  . Large Joint Inj   No orders of the defined types were placed in this encounter.     Procedures: Large Joint Inj: R knee on 06/18/2020 8:55 AM Indications: diagnostic evaluation and pain Details: 22 G 1.5 in needle, superolateral approach  Arthrogram: No  Medications: 3 mL lidocaine 1 %; 40 mg methylPREDNISolone acetate 40 MG/ML Outcome: tolerated well, no immediate complications Procedure, treatment alternatives, risks and benefits explained, specific risks discussed. Consent was given by the patient. Immediately prior to procedure a time out was called to verify the correct patient, procedure, equipment, support staff and site/side marked as required. Patient was prepped and draped in the usual sterile fashion.       Clinical Data: No additional findings.   Subjective: Chief Complaint  Patient presents with  . Right Knee - Pain    The patient comes in with continued right knee pain.  She is 84 years old and does ambulate using a cane.  She points to the joint line medially and laterally as a source of her pain.  We have provided 1 steroid injection for that knee and more recently about 6 weeks ago placed a hyaluronic acid injection into the right knee.  She says that the steroid injection did well but the hyaluronic acid is really not helped.  Surprisingly her plain films do not show severe arthritic changes in the knee at the medial or lateral compartments.  She said no other acute change in her medical status.  She is trying to avoid any surgical treatments for this.  HPI  Review of Systems She currently denies any headache, chest pain, shortness of breath, fever, chills, nausea, vomiting  Objective: Vital Signs: There were no vitals taken for this visit.  Physical Exam She is alert and orient x3 and in no acute distress Ortho Exam Examination of her right knee shows some global tenderness with slight varus malalignment.  There is venous stasis changes in both shins. Specialty Comments:  No specialty comments available.  Imaging: No results found. I reviewed previous x-rays of her right knee showing only mild arthritic changes.  PMFS History: Patient Active Problem List   Diagnosis Date Noted  . Branch retinal vein  occlusion with macular edema of left eye 04/16/2020  . Dry eyes 04/16/2020  . Early stage nonexudative age-related macular degeneration of both eyes 04/16/2020  . Dizziness 11/30/2019  . Educated about COVID-19 virus infection 11/29/2019  . Dyslipidemia 05/18/2019  . Coronary artery disease involving native coronary artery of native heart without angina pectoris 01/01/2019  . Tremor 11/01/2018  . Shortness of breath 09/25/2018  . Hypothyroidism 10/30/2013  . TIA (transient ischemic attack)- remote 10/30/2013  . Leukopenia 10/29/2013  . Elevated troponin- 0.58 in setting of PSVT 10/29/2013  .  Community acquired pneumonia 10/28/2013  . Dyspnea 10/28/2013  . PSVT (paroxysmal supraventricular tachycardia) ([place]) 10/28/2013  . Personal history of pulmonary embolism- postop in the 80's 10/28/2013  . Hyponatremia 10/28/2013  . Osteoporosis   . Essential hypertension, benign 07/11/2009  . HYPERLIPIDEMIA 01/10/2009  . CAROTID STENOSIS- moderate 01/10/2009  . MACULAR DEGENERATION 01/09/2009  . CAD- severe LAD and RCA disease with collaterals from CFX Sept 2009. Medical Rx 01/09/2009  . DIVERTICULAR DISEASE 01/09/2009  . Rheumatoid arthritis(714.0) 01/09/2009  . OSTEOPOROSIS 01/09/2009   Past Medical History:  Diagnosis Date  . CAD, UNSPECIFIED SITE    2009. 99% proximal RCA stenosis followed by 80% distal stenosis. The LAD has a 100% stenosis in the midsegment. The circumflex is patent with distal occlusion. She has been managed medically. Her EF was well-preserved.  Marland Kitchen CAROTID STENOSIS   . DIVERTICULAR DISEASE   . Essential hypertension, benign   . Gallstones 2009  . Gastritis   . H/O blood clots 1984   DVT and PE  . Hx of degenerative disc disease   . HYPERLIPIDEMIA   . MACULAR DEGENERATION   . OSTEOPOROSIS   . Pneumonia   . Rheumatoid arthritis(714.0)   . Thyroid cancer ([place]) 2008    Family History  Problem Relation Age of Onset  . Heart attack Mother   . Clotting disorder Mother   . Heart disease Father   . Diabetes Father   . Ovarian cancer Maternal Aunt   . Lung cancer Brother   . CAD Sister   . Diabetes Niece   . Lung cancer Maternal Uncle     Past Surgical History:  Procedure Laterality Date  . ABDOMINAL HYSTERECTOMY  1984  . APPENDECTOMY  1959  . BREAST CYST EXCISION     left  . CATARACT EXTRACTION, BILATERAL    . CHOLECYSTECTOMY  2009  . COLONOSCOPY    . ESOPHAGOGASTRODUODENOSCOPY    . IM NAILING FEMORAL SHAFT RETROGRADE  2010   left  . TONSILLECTOMY    . WRIST SURGERY  2004   Left   Social History   Occupational History  . Occupation:  retired    Comment: Network engineer  Tobacco Use  . Smoking status: Never Smoker  . Smokeless tobacco: Never Used  Vaping Use  . Vaping Use: Never used  Substance and Sexual Activity  . Alcohol use: No  . Drug use: No  . Sexual activity: Not on file

## 2020-06-19 DIAGNOSIS — Z0120 Encounter for dental examination and cleaning without abnormal findings: Secondary | ICD-10-CM | POA: Diagnosis not present

## 2020-07-15 ENCOUNTER — Ambulatory Visit: Payer: Medicare Other | Admitting: Orthopaedic Surgery

## 2020-07-15 ENCOUNTER — Encounter: Payer: Self-pay | Admitting: Orthopaedic Surgery

## 2020-07-15 ENCOUNTER — Other Ambulatory Visit: Payer: Self-pay

## 2020-07-15 DIAGNOSIS — G8929 Other chronic pain: Secondary | ICD-10-CM

## 2020-07-15 DIAGNOSIS — M25561 Pain in right knee: Secondary | ICD-10-CM | POA: Diagnosis not present

## 2020-07-15 NOTE — Progress Notes (Signed)
The patient continues to complain of right knee pain.  She ambulates with a rolling walker.  This is been getting progressively worse over 6 months.  She has had a least 2 steroid injections in the right knee.  Her plain films show still decently maintained joint space.  She points to the medial lateral tibial plateau as a source of her pain.  She does have a history of osteoporosis and has had stress fractures before.  She had a left hip fracture due to a stress fracture that broke through.  This was a subtrochanteric fracture that required an intramedullary nail.  She has had a calcaneus fracture on the left side and a back fracture that were all stress fractures.  She wonders if this is the case.  Steroid injections have not helped at all.  She has had no other acute change in medical status.  She currently denies any headache, chest pain, shortness of breath, fever, chills, nausea, vomiting.  She has been trying to limit her weightbearing.  On examination of her right knee there is slight varus malalignment.  There is medial lateral joint line tenderness and tenderness in the metaphyseal area of the tibia.  There is no knee joint effusion.  She has good range of motion of that knee.  At this point a MRI of the right knee is warranted to assess the cartilage and rule out a stress fracture given the failure of conservative treatment.  She is worked on Forensic scientist exercises on her own as well.  With all the conservative modalities failing and the worry for stress fracture given her previous history, this MRI is medically warranted.  We will see her back after the MRI.  All questions and concerns were answered and addressed.

## 2020-07-17 ENCOUNTER — Ambulatory Visit: Payer: Medicare Other | Admitting: Orthopedic Surgery

## 2020-07-20 DIAGNOSIS — Z23 Encounter for immunization: Secondary | ICD-10-CM | POA: Diagnosis not present

## 2020-07-29 ENCOUNTER — Ambulatory Visit: Payer: Medicare Other | Admitting: Orthopaedic Surgery

## 2020-08-04 ENCOUNTER — Ambulatory Visit
Admission: RE | Admit: 2020-08-04 | Discharge: 2020-08-04 | Disposition: A | Discharge status: Home / Self Care | Payer: Medicare Other | Source: Ambulatory Visit | Attending: Orthopaedic Surgery | Admitting: Orthopaedic Surgery

## 2020-08-04 ENCOUNTER — Other Ambulatory Visit: Payer: Self-pay

## 2020-08-04 DIAGNOSIS — S83241A Other tear of medial meniscus, current injury, right knee, initial encounter: Secondary | ICD-10-CM | POA: Diagnosis not present

## 2020-08-04 DIAGNOSIS — M1711 Unilateral primary osteoarthritis, right knee: Secondary | ICD-10-CM | POA: Diagnosis not present

## 2020-08-04 DIAGNOSIS — G8929 Other chronic pain: Secondary | ICD-10-CM

## 2020-08-06 ENCOUNTER — Encounter: Payer: Self-pay | Admitting: Orthopaedic Surgery

## 2020-08-06 ENCOUNTER — Ambulatory Visit: Payer: Medicare Other | Admitting: Orthopaedic Surgery

## 2020-08-06 DIAGNOSIS — M25561 Pain in right knee: Secondary | ICD-10-CM | POA: Diagnosis not present

## 2020-08-06 DIAGNOSIS — G8929 Other chronic pain: Secondary | ICD-10-CM

## 2020-08-06 MED ORDER — LIDOCAINE 5 % EX PTCH: 1.0000 | MEDICATED_PATCH | CUTANEOUS | 0 refills | Status: DC

## 2020-08-06 NOTE — Progress Notes (Signed)
The patient is a very pleasant 84 year old female who comes in today to go over an MRI of her right knee.  She has chronic knee pain in that knee and tried a knee sleeve and other activities to try to get it calmed down.  She does use a rolling walker.  She denies any swelling or locking and catching.  On examination her pain is distal to the knee joint medially and laterally of the right knee.  It hurts to palpation.  There is prominent varicose veins on both sides of her leg in this area.  There is no knee effusion today and her Lachman's McMurray's are negative and she has good range of motion and no posterior knee pain.  MRI of her knee shows intact cartilage throughout the knee with only mild arthritis.  There is a small medial meniscal tear that is incomplete but this is not where she is symptomatic.  I see no stress response in the bone on the medial or lateral aspects of the proximal tibia.  This may be just painful varicose veins that she is dealing with.  We will see if I can send in some lidocaine or Lidoderm patches to see if this will help but I have no other recommendations for her from an orthopedic surgery standpoint.  All questions and concerns were answered and addressed.

## 2020-08-19 ENCOUNTER — Encounter (INDEPENDENT_AMBULATORY_CARE_PROVIDER_SITE_OTHER): Payer: Self-pay | Admitting: Ophthalmology

## 2020-08-19 ENCOUNTER — Other Ambulatory Visit: Payer: Self-pay

## 2020-08-19 ENCOUNTER — Ambulatory Visit (INDEPENDENT_AMBULATORY_CARE_PROVIDER_SITE_OTHER): Payer: Medicare Other | Admitting: Ophthalmology

## 2020-08-19 DIAGNOSIS — H34832 Tributary (branch) retinal vein occlusion, left eye, with macular edema: Secondary | ICD-10-CM | POA: Diagnosis not present

## 2020-08-19 DIAGNOSIS — H353131 Nonexudative age-related macular degeneration, bilateral, early dry stage: Secondary | ICD-10-CM | POA: Diagnosis not present

## 2020-08-19 DIAGNOSIS — H353212 Exudative age-related macular degeneration, right eye, with inactive choroidal neovascularization: Secondary | ICD-10-CM | POA: Diagnosis not present

## 2020-08-19 DIAGNOSIS — H348322 Tributary (branch) retinal vein occlusion, left eye, stable: Secondary | ICD-10-CM | POA: Diagnosis not present

## 2020-08-19 NOTE — Assessment & Plan Note (Signed)
No active disease left eye

## 2020-08-19 NOTE — Progress Notes (Signed)
08/19/2020     CHIEF COMPLAINT Patient presents for Retina Follow Up   HISTORY OF PRESENT ILLNESS: [name] is a 84 y.o. adult who presents to the clinic today for:   HPI    Retina Follow Up    Patient presents with  CRVO/BRVO.  In left eye.  This started 4 months ago.  Severity is mild.  Duration of 4 months.  Since onset it is stable.          Comments    4 mo F/U OU  Stable vision, no F/F, no pain. Pt started Pataday per [place].       Last edited by Nichola Sizer D on 08/19/2020 10:07 AM. (History)      Referring physician: Reynold Bowen, MD [place],  [place] [address]  HISTORICAL INFORMATION:   Selected notes from the MEDICAL RECORD NUMBER       CURRENT MEDICATIONS: Current Outpatient Medications (Ophthalmic Drugs)  Medication Sig  . Tetrahydrozoline-Zn Sulfate (EYE DROPS A/C OP) Place 1 drop into both eyes every 6 (six) hours as needed (for dry eyes).    No current facility-administered medications for this visit. (Ophthalmic Drugs)   Current Outpatient Medications (Other)  Medication Sig  . Acetaminophen (TYLENOL PO) Take 500 mg by mouth every 6 (six) hours as needed (pain).   Marland Kitchen amLODipine (NORVASC) 5 MG tablet Take 1.5 tablets (7.5 mg total) by mouth daily.  Marland Kitchen aspirin 81 MG tablet Take 81 mg by mouth daily.    Marland Kitchen atorvastatin (LIPITOR) 40 MG tablet Take 40 mg by mouth daily.    . Calcium Carbonate-Vitamin D (CALCIUM + D PO) Take 1 tablet by mouth 2 (two) times daily.   . clopidogrel (PLAVIX) 75 MG tablet Take 75 mg by mouth daily.    . fish oil-omega-3 fatty acids 1000 MG capsule Take 1,200 g by mouth daily.   . isosorbide mononitrate (IMDUR) 30 MG 24 hr tablet Take 30 mg by mouth daily.    Marland Kitchen levothyroxine (SYNTHROID, LEVOTHROID) 88 MCG tablet Take 1 tablet (88 mcg total) by mouth daily before breakfast.  . lidocaine (LIDODERM) 5 % Place 1 patch onto the skin daily. Remove & Discard patch within 12 hours or as directed by MD  .  losartan (COZAAR) 50 MG tablet Take 1 tablet (50 mg total) by mouth 2 (two) times daily.  . metoprolol succinate (TOPROL-XL) 100 MG 24 hr tablet Take 100 mg by mouth 2 (two) times daily. Take with or immediately following a meal.  . Multiple Vitamins-Minerals (CENTRUM SILVER 50+WOMEN) TABS Take 1 tablet by mouth daily.  . Multiple Vitamins-Minerals (OCUVITE PRESERVISION PO) Take by mouth 2 (two) times daily.    . nitroGLYCERIN (NITROSTAT) 0.4 MG SL tablet Place 1 tablet (0.4 mg total) under the tongue every 5 (five) minutes as needed.  Marland Kitchen tiZANidine (ZANAFLEX) 4 MG tablet Take 1 tablet (4 mg total) by mouth every 8 (eight) hours as needed for muscle spasms.  . traMADol (ULTRAM) 50 MG tablet Take 1-2 tablets (50-100 mg total) by mouth every 6 (six) hours as needed.   No current facility-administered medications for this visit. (Other)      REVIEW OF SYSTEMS:    ALLERGIES Allergies  Allergen Reactions  . Hydrocodone Other (See Comments)    "feels weird"  . Prednisone Nausea And Vomiting    Oral    PAST MEDICAL HISTORY Past Medical History:  Diagnosis Date  . CAD, UNSPECIFIED SITE    2009.  99% proximal RCA stenosis followed by 80% distal stenosis. The LAD has a 100% stenosis in the midsegment. The circumflex is patent with distal occlusion. She has been managed medically. Her EF was well-preserved.  Marland Kitchen CAROTID STENOSIS   . DIVERTICULAR DISEASE   . Essential hypertension, benign   . Gallstones 2009  . Gastritis   . H/O blood clots 1984   DVT and PE  . Hx of degenerative disc disease   . HYPERLIPIDEMIA   . MACULAR DEGENERATION   . OSTEOPOROSIS   . Pneumonia   . Rheumatoid arthritis(714.0)   . Thyroid cancer ([place]) 2008   Past Surgical History:  Procedure Laterality Date  . ABDOMINAL HYSTERECTOMY  1984  . APPENDECTOMY  1959  . BREAST CYST EXCISION     left  . CATARACT EXTRACTION, BILATERAL    . CHOLECYSTECTOMY  2009  . COLONOSCOPY    . ESOPHAGOGASTRODUODENOSCOPY    . IM  NAILING FEMORAL SHAFT RETROGRADE  2010   left  . TONSILLECTOMY    . WRIST SURGERY  2004   Left    FAMILY HISTORY Family History  Problem Relation Age of Onset  . Heart attack Mother   . Clotting disorder Mother   . Heart disease Father   . Diabetes Father   . Ovarian cancer Maternal Aunt   . Lung cancer Brother   . CAD Sister   . Diabetes Niece   . Lung cancer Maternal Uncle     SOCIAL HISTORY Social History   Tobacco Use  . Smoking status: Never Smoker  . Smokeless tobacco: Never Used  Vaping Use  . Vaping Use: Never used  Substance Use Topics  . Alcohol use: No  . Drug use: No         OPHTHALMIC EXAM:  Base Eye Exam    Visual Acuity (ETDRS)      Right Left   Dist cc 20/40 -1 20/30 -1   Dist ph cc NI NI   Correction: Glasses       Tonometry (Tonopen, 10:08 AM)      Right Left   Pressure 9 10       Pupils      Pupils Dark Light Shape React APD   Right PERRL 4 3 Round Slow None   Left PERRL 4 3 Round Slow None       Visual Fields (Counting fingers)      Left Right    Full Full       Extraocular Movement      Right Left    Full Full       Neuro/Psych    Oriented x3: Yes   Mood/Affect: Normal       Dilation    Both eyes: 1.0% Mydriacyl, 2.5% Phenylephrine @ 10:10 AM        Slit Lamp and Fundus Exam    External Exam      Right Left   External Normal Normal       Slit Lamp Exam      Right Left   Lids/Lashes Normal Normal   Conjunctiva/Sclera White and quiet White and quiet   Cornea Clear Clear   Anterior Chamber Deep and quiet Deep and quiet   Iris Round and reactive Round and reactive   Lens Posterior chamber intraocular lens Posterior chamber intraocular lens   Anterior Vitreous Normal Normal       Fundus Exam      Right Left   Disc Normal Normal  C/D Ratio 0.4 0.45   Macula Hard drusen, no macular thickening Microaneurysms, no macular thickening, Hard drusen   Vessels Normal old brvo inf macula   Periphery Normal  Normal          IMAGING AND PROCEDURES  Imaging and Procedures for 08/19/20  OCT, Retina - OU - Both Eyes       Right Eye Quality was good. Scan locations included subfoveal. Central Foveal Thickness: 275. Progression has improved. Findings include abnormal foveal contour.   Left Eye Quality was good. Central Foveal Thickness: 356. Findings include abnormal foveal contour.   Notes Much less macular edema the right eye, old BRVO  No active maculopathy OS although some minor thickening present                ASSESSMENT/PLAN:  Early stage nonexudative age-related macular degeneration of both eyes The nature of age--related macular degeneration was discussed with the patient as well as the distinction between dry and wet types. Checking an Amsler Grid daily with advice to return immediately should a distortion develop, was given to the patient. The patient 's smoking status now and in the past was determined and advice based on the AREDS study was provided regarding the consumption of antioxidant supplements. AREDS 2 vitamin formulation was recommended. Consumption of dark leafy vegetables and fresh fruits of various colors was recommended. Treatment modalities for wet macular degeneration particularly the use of intravitreal injections of anti-blood vessel growth factors was discussed with the patient. Avastin, Lucentis, and Eylea are the available options. On occasion, therapy includes the use of photodynamic therapy and thermal laser. Stressed to the patient do not rub eyes.  Patient was advised to check Amsler Grid daily and return immediately if changes are noted. Instructions on using the grid were given to the patient. All patient questions were answered.  Stable branch retinal vein occlusion of left eye No active disease left eye  Exudative age-related macular degeneration of right eye with inactive choroidal neovascularization (HCC) History of CNVM OD in the past, now  stable and resolved      ICD-10-CM   1. Branch retinal vein occlusion with macular edema of left eye  H34.8320 OCT, Retina - OU - Both Eyes  2. Early stage nonexudative age-related macular degeneration of both eyes  H35.3131   3. Stable branch retinal vein occlusion of left eye  H34.8322   4. Exudative age-related macular degeneration of right eye with inactive choroidal neovascularization ([place])  H35.3212     1.  History of BRVO OS in the past, no active disease  2.  History of wet AMD OD in the past, no active disease  3.  Ophthalmic Meds Ordered this visit:  No orders of the defined types were placed in this encounter.      Return in about 6 months (around 02/16/2021) for DILATE OU, COLOR FP, OCT.  There are no Patient Instructions on file for this visit.   Explained the diagnoses, plan, and follow up with the patient and they expressed understanding.  Patient expressed understanding of the importance of proper follow up care.   Clent Demark Nachelle Negrette M.D. Diseases & Surgery of the Retina and Vitreous [facility] [place] 08/19/20     Abbreviations: M myopia (nearsighted); A astigmatism; H hyperopia (farsighted); P presbyopia; Mrx spectacle prescription;  CTL contact lenses; OD right eye; OS left eye; OU both eyes  XT exotropia; ET esotropia; PEK punctate epithelial keratitis; PEE punctate epithelial erosions; DES dry eye  syndrome; MGD meibomian gland dysfunction; ATs artificial tears; PFAT's preservative free artificial tears; [place] nuclear sclerotic cataract; PSC posterior subcapsular cataract; ERM epi-retinal membrane; PVD posterior vitreous detachment; RD retinal detachment; DM diabetes mellitus; DR diabetic retinopathy; NPDR non-proliferative diabetic retinopathy; PDR proliferative diabetic retinopathy; CSME clinically significant macular edema; DME diabetic macular edema; dbh dot blot hemorrhages; CWS cotton wool spot; POAG primary open angle glaucoma; C/D cup-to-disc  ratio; HVF humphrey visual field; GVF goldmann visual field; OCT optical coherence tomography; IOP intraocular pressure; BRVO Branch retinal vein occlusion; CRVO central retinal vein occlusion; CRAO central retinal artery occlusion; BRAO branch retinal artery occlusion; RT retinal tear; SB scleral buckle; PPV pars plana vitrectomy; VH Vitreous hemorrhage; PRP panretinal laser photocoagulation; IVK intravitreal kenalog; VMT vitreomacular traction; MH Macular hole;  NVD neovascularization of the disc; NVE neovascularization elsewhere; AREDS age related eye disease study; ARMD age related macular degeneration; POAG primary open angle glaucoma; EBMD epithelial/anterior basement membrane dystrophy; ACIOL anterior chamber intraocular lens; IOL intraocular lens; PCIOL posterior chamber intraocular lens; Phaco/IOL phacoemulsification with intraocular lens placement; [place] photorefractive keratectomy; LASIK laser assisted in situ keratomileusis; HTN hypertension; DM diabetes mellitus; COPD chronic obstructive pulmonary disease

## 2020-08-19 NOTE — Assessment & Plan Note (Signed)

## 2020-08-19 NOTE — Assessment & Plan Note (Signed)
History of CNVM OD in the past, now stable and resolved

## 2020-10-02 DIAGNOSIS — E785 Hyperlipidemia, unspecified: Secondary | ICD-10-CM | POA: Diagnosis not present

## 2020-10-02 DIAGNOSIS — M810 Age-related osteoporosis without current pathological fracture: Secondary | ICD-10-CM | POA: Diagnosis not present

## 2020-10-02 DIAGNOSIS — E890 Postprocedural hypothyroidism: Secondary | ICD-10-CM | POA: Diagnosis not present

## 2020-10-08 DIAGNOSIS — R779 Abnormality of plasma protein, unspecified: Secondary | ICD-10-CM | POA: Diagnosis not present

## 2020-10-08 DIAGNOSIS — I700 Atherosclerosis of aorta: Secondary | ICD-10-CM | POA: Diagnosis not present

## 2020-10-08 DIAGNOSIS — E890 Postprocedural hypothyroidism: Secondary | ICD-10-CM | POA: Diagnosis not present

## 2020-10-08 DIAGNOSIS — R82998 Other abnormal findings in urine: Secondary | ICD-10-CM | POA: Diagnosis not present

## 2020-10-08 DIAGNOSIS — E785 Hyperlipidemia, unspecified: Secondary | ICD-10-CM | POA: Diagnosis not present

## 2020-10-08 DIAGNOSIS — Z0000 Encounter for general adult medical examination without abnormal findings: Secondary | ICD-10-CM | POA: Diagnosis not present

## 2020-11-05 IMAGING — CT CT ANGIO CHEST
2 of 13 series · 19 of 46 positions shown · IV contrast (iopamidol)
Comparison: Plain film from earlier in the same day, CT from
10/28/2013.

CLINICAL DATA: Cough and shortness of breath

EXAM:
CT ANGIOGRAPHY CHEST WITH CONTRAST
TECHNIQUE: Multidetector CT imaging of the chest was performed using the
standard protocol during bolus administration of intravenous
contrast. Multiplanar CT image reconstructions and MIPs were
obtained to evaluate the vascular anatomy.
CONTRAST:  100mL NE0RO9-H5V IOPAMIDOL (NE0RO9-H5V) INJECTION 76%

[Series 13: thins · axial · 0.63mm/px · z∈[+1125,+1372]mm · 17 of 277 slices shown]
[im 15/277  lung]
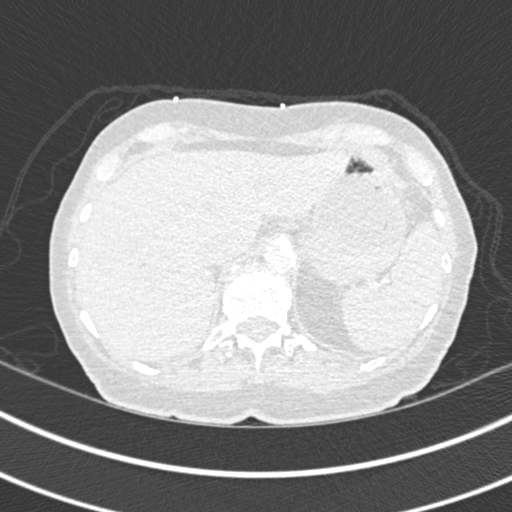
[im 30/277  soft-tissue]
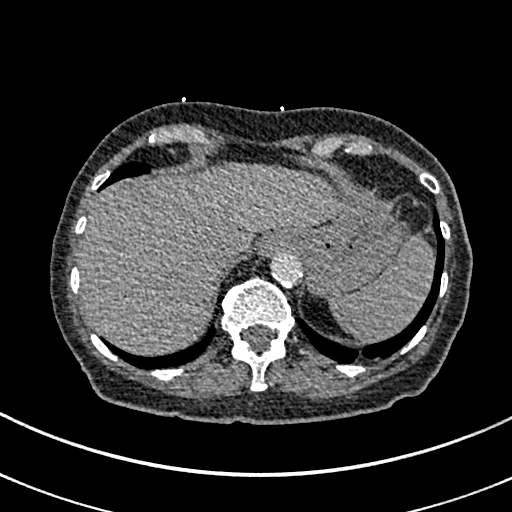
[im 44/277  lung]
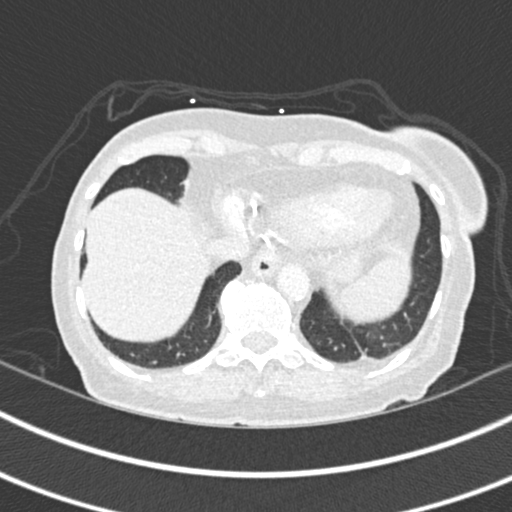
[im 59/277  soft-tissue]
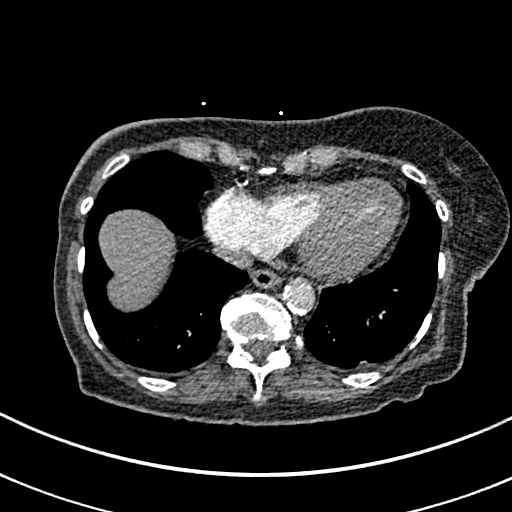
[im 73/277  lung]
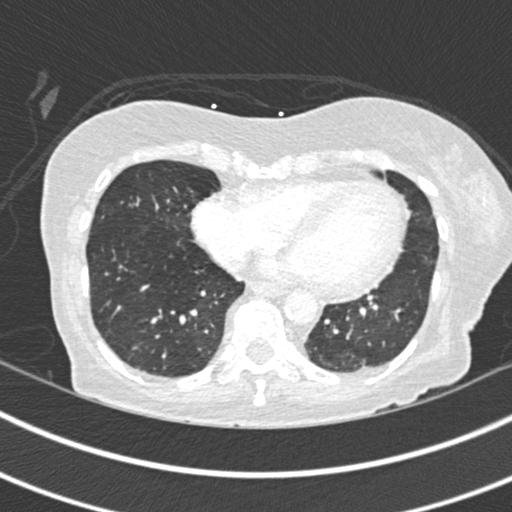
[im 88/277  soft-tissue]
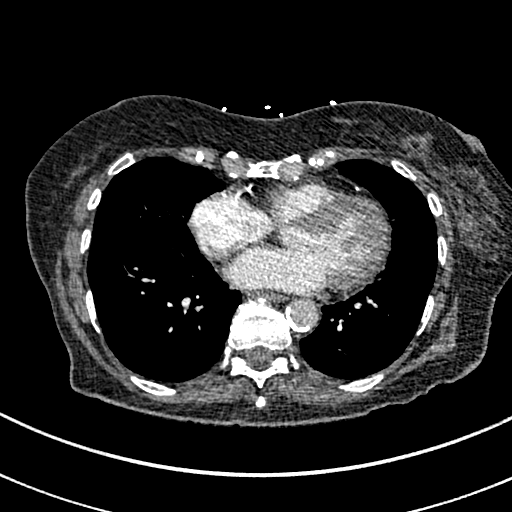
[im 102/277  lung]
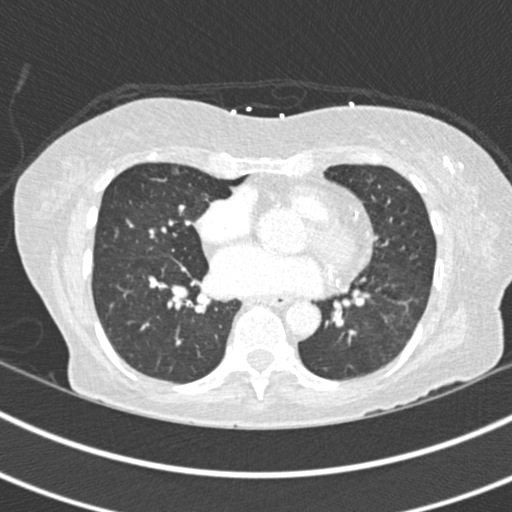
[im 117/277  soft-tissue]
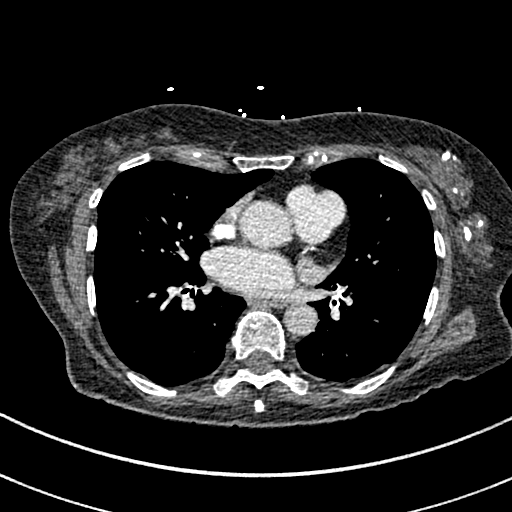
[im 146/277  lung]
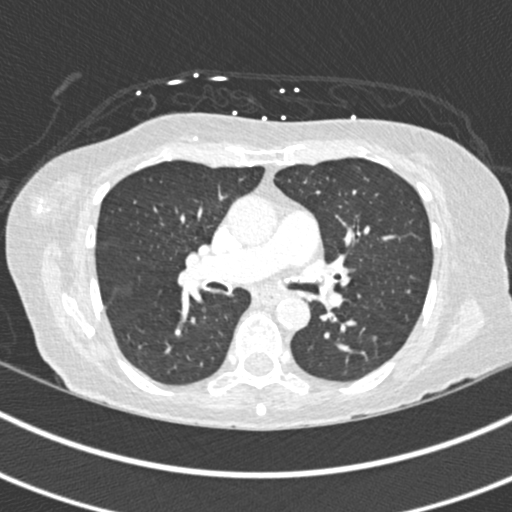
[im 160/277  soft-tissue]
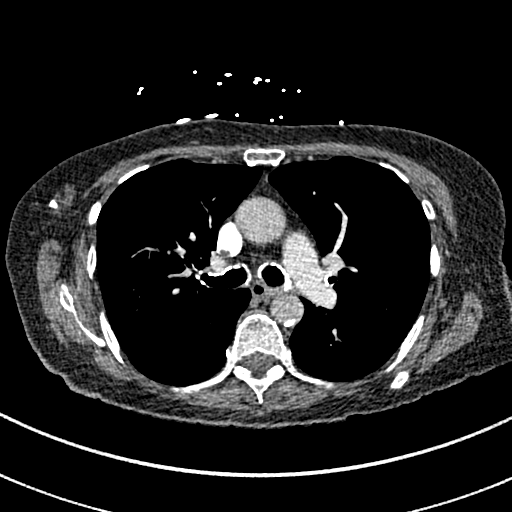
[im 175/277  lung]
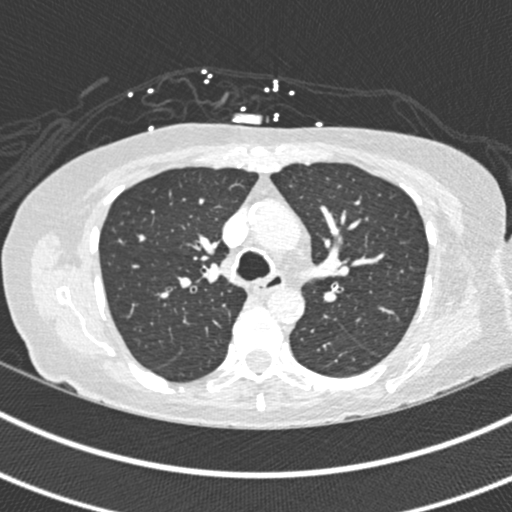
[im 189/277  soft-tissue]
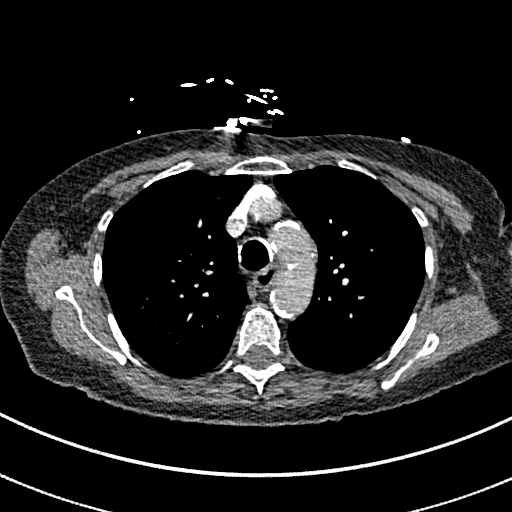
[im 204/277  lung]
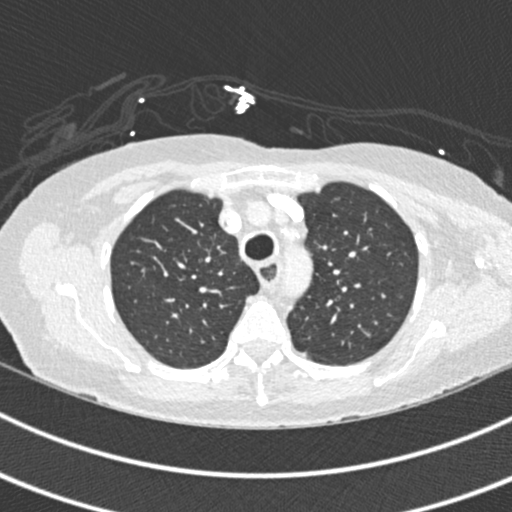
[im 218/277  soft-tissue]
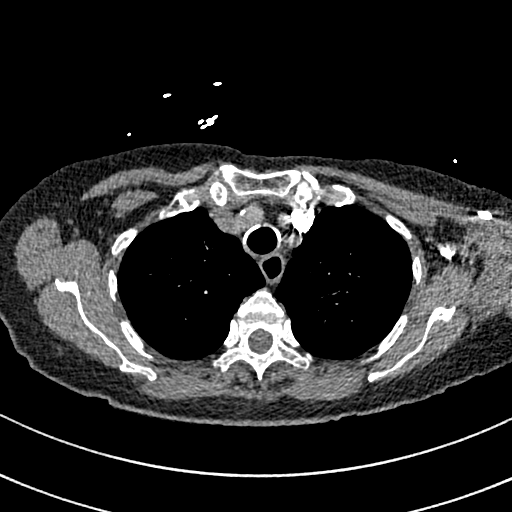
[im 233/277  lung]
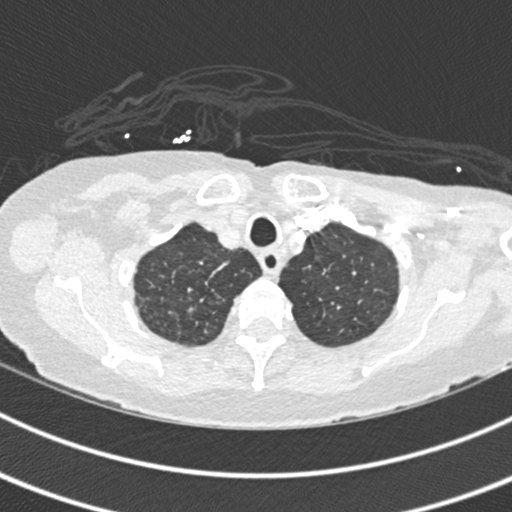
[im 247/277  soft-tissue]
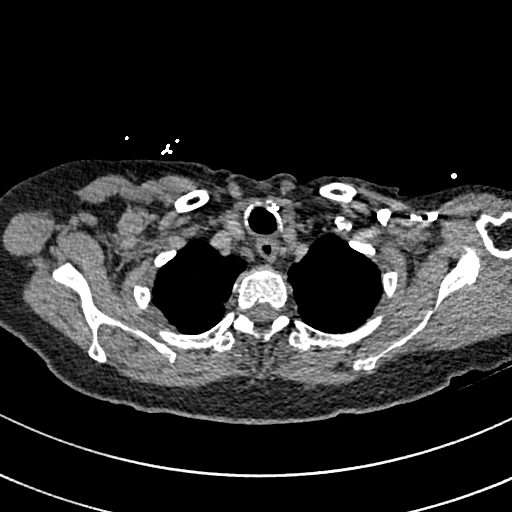
[im 262/277  lung]
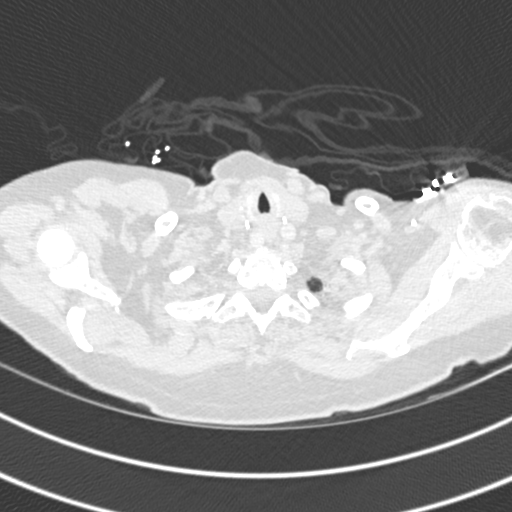

[Series 15: coronal mpr · coronal · 0.57mm/px · 2 of 101 slices shown]
[im 34/101  soft-tissue]
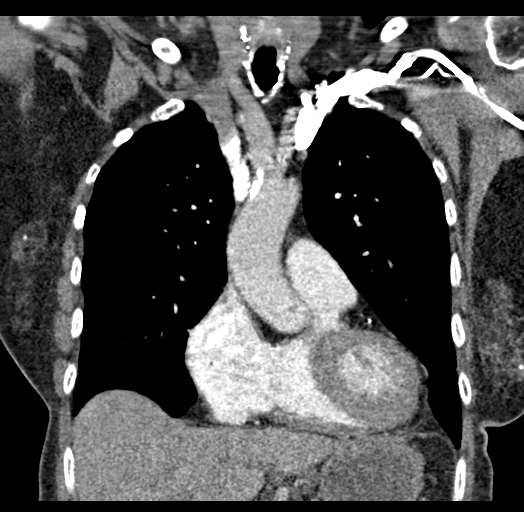
[im 67/101  soft-tissue]
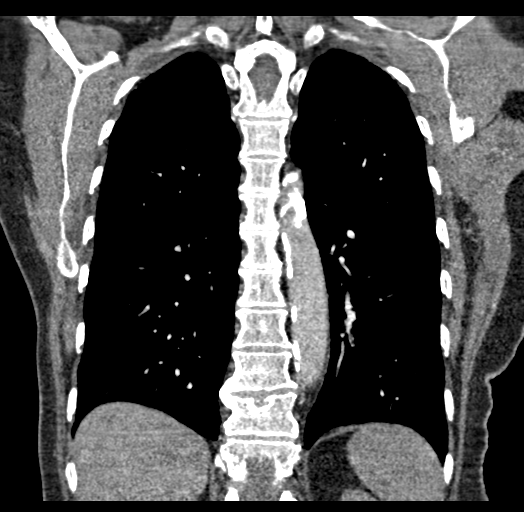

[19 of 46 positions shown; findings below may reference images not displayed]

FINDINGS: Cardiovascular: Thoracic aorta demonstrates significant
atherosclerotic calcification. Dilatation of the ascending aorta is
noted to 3.4 cm. No evidence of dissection is seen. No cardiac
enlargement is noted. Diffuse coronary calcifications are seen.
Pulmonary artery is well visualized within normal branching pattern.
No filling defects to suggest pulmonary embolism are identified.

Mediastinum/Nodes: No hilar or mediastinal adenopathy is noted.
Esophagus appears within normal limits. The thoracic inlet shows
changes consistent with prior thyroidectomy.

Lungs/Pleura: Lungs are well aerated bilaterally with mild
emphysematous changes. Scarring is noted in the left lower lobe
posteriorly. No focal infiltrate or sizable effusion is seen. No
parenchymal nodules are noted.

Upper Abdomen: Visualized upper abdomen demonstrates fullness of the
biliary tree related to the post cholecystectomy state. No other
abnormality in the upper abdomen is seen.

Musculoskeletal: Degenerative changes of the thoracic spine are
noted. Bilateral breast calcifications are seen similar to that
noted on prior exam.

Review of the MIP images confirms the above findings.
IMPRESSION: No evidence of pulmonary emboli.

COPD.

Mild dilatation of the ascending aorta 3.4 cm.

Changes consistent with prior cholecystectomy.

Aortic Atherosclerosis (CDRPT-AQE.E) and Emphysema (CDRPT-476.K).

## 2020-11-24 ENCOUNTER — Other Ambulatory Visit: Payer: Self-pay | Admitting: Cardiology

## 2020-11-27 NOTE — Progress Notes (Unsigned)
Cardiology Office Note   Date:  11/29/2020   ID:  [name], DOB Jul 27, 1936, MRN [id number]  PCP:  Reynold Bowen, MD  Cardiologist:   Minus Breeding, MD   Chief Complaint  Patient presents with  . Coronary Artery Disease      History of Present Illness: [name] is a 85 y.o. adult who presents for follow up o If CAD.  She was admitted to the hospital in December 2019 with bronchitis.  She had an elevated troponin which was thought to be demand ischemia.  Since I last saw her she has done well.  The patient denies any new symptoms such as chest discomfort, neck or arm discomfort. There has been no new shortness of breath, PND or orthopnea. There have been no reported palpitations, presyncope or syncope.  She has some knee trouble but still does her household chores.    Past Medical History:  Diagnosis Date  . CAD, UNSPECIFIED SITE    2009. 99% proximal RCA stenosis followed by 80% distal stenosis. The LAD has a 100% stenosis in the midsegment. The circumflex is patent with distal occlusion. She has been managed medically. Her EF was well-preserved.  Marland Kitchen CAROTID STENOSIS   . DIVERTICULAR DISEASE   . Essential hypertension, benign   . Gallstones 2009  . Gastritis   . H/O blood clots 1984   DVT and PE  . Hx of degenerative disc disease   . HYPERLIPIDEMIA   . MACULAR DEGENERATION   . OSTEOPOROSIS   . Pneumonia   . Rheumatoid arthritis(714.0)   . Thyroid cancer ([place]) 2008    Past Surgical History:  Procedure Laterality Date  . ABDOMINAL HYSTERECTOMY  1984  . APPENDECTOMY  1959  . BREAST CYST EXCISION     left  . CATARACT EXTRACTION, BILATERAL    . CHOLECYSTECTOMY  2009  . COLONOSCOPY    . ESOPHAGOGASTRODUODENOSCOPY    . IM NAILING FEMORAL SHAFT RETROGRADE  2010   left  . TONSILLECTOMY    . WRIST SURGERY  2004   Left     Current Outpatient Medications  Medication Sig Dispense Refill  . Acetaminophen (TYLENOL PO) Take 500 mg by mouth every 6  (six) hours as needed (pain).    Marland Kitchen amLODipine (NORVASC) 5 MG tablet TAKE 1 AND 1/2 TABLETS(7.5 MG) BY MOUTH DAILY 60 tablet 3  . aspirin 81 MG tablet Take 81 mg by mouth daily.    Marland Kitchen atorvastatin (LIPITOR) 40 MG tablet Take 40 mg by mouth daily.    . Calcium Carbonate-Vitamin D (CALCIUM + D PO) Take 1 tablet by mouth 2 (two) times daily.    . clopidogrel (PLAVIX) 75 MG tablet Take 75 mg by mouth daily.    . fish oil-omega-3 fatty acids 1000 MG capsule Take 1,200 g by mouth daily.    . isosorbide mononitrate (IMDUR) 30 MG 24 hr tablet Take 30 mg by mouth daily.    Marland Kitchen levothyroxine (SYNTHROID, LEVOTHROID) 88 MCG tablet Take 1 tablet (88 mcg total) by mouth daily before breakfast. 30 tablet 6  . losartan (COZAAR) 50 MG tablet Take 1 tablet (50 mg total) by mouth 2 (two) times daily. 180 tablet 1  . metoprolol succinate (TOPROL-XL) 100 MG 24 hr tablet Take 100 mg by mouth 2 (two) times daily. Take with or immediately following a meal.    . Multiple Vitamins-Minerals (CENTRUM SILVER 50+WOMEN) TABS Take 1 tablet by mouth daily.    . Multiple Vitamins-Minerals (OCUVITE PRESERVISION  PO) Take by mouth 2 (two) times daily.    . nitroGLYCERIN (NITROSTAT) 0.4 MG SL tablet Place 1 tablet (0.4 mg total) under the tongue every 5 (five) minutes as needed. 25 tablet 6  . Tetrahydrozoline-Zn Sulfate (EYE DROPS A/C OP) Place 1 drop into both eyes every 6 (six) hours as needed (for dry eyes).     No current facility-administered medications for this visit.    Allergies:   Hydrocodone and Prednisone     ROS:  Please see the history of present illness.   Otherwise, review of systems are positive for tremor, knee pain, varicose veins.   All other systems are reviewed and negative.    PHYSICAL EXAM: VS:  BP 130/64 (BP Location: Left Arm, Patient Position: Sitting)   Pulse (!) 53   Ht 5\' 3"  (1.6 m)   Wt 136 lb 6.4 oz (61.9 kg)   SpO2 94%   BMI 24.16 kg/m  , BMI Body mass index is 24.16 kg/m. GENERAL:  Well  appearing NECK:  No jugular venous distention, waveform within normal limits, carotid upstroke brisk and symmetric, no bruits, no thyromegaly LUNGS:  Clear to auscultation bilaterally CHEST:  Unremarkable HEART:  PMI not displaced or sustained,S1 and S2 within normal limits, no S3, no S4, no clicks, no rubs, soft apical systolic murmur nonradiating, no diastolic murmurs ABD:  Flat, positive bowel sounds normal in frequency in pitch, no bruits, no rebound, no guarding, no midline pulsatile mass, no hepatomegaly, no splenomegaly EXT:  2 plus pulses throughout, no edema, no cyanosis no clubbing     EKG:  EKG is  ordered today. The ekg ordered today demonstrates sinus rhythm, rate 53, axis within normal limits, intervals within normal limits, no acute ST-T wave changes.   Recent Labs: No results found for requested labs within last 8760 hours.    Lipid Panel    Component Value Date/Time   CHOL 157 07/04/2015 0856   TRIG 84 07/04/2015 0856   HDL 79 07/04/2015 0856   CHOLHDL 2.0 07/04/2015 0856   VLDL 17 07/04/2015 0856   LDLCALC 61 07/04/2015 0856      Wt Readings from Last 3 Encounters:  11/29/20 136 lb 6.4 oz (61.9 kg)  05/06/20 143 lb (64.9 kg)  11/30/19 143 lb 6.4 oz (65 kg)      Other studies Reviewed: Additional studies/ records that were reviewed today include: Labs. Review of the above records demonstrates:  Please see elsewhere in the note.     ASSESSMENT AND PLAN:   CAD: The patient has no new sypmtoms.  No further cardiovascular testing is indicated.  We will continue with aggressive risk reduction and meds as listed.  HTN: The blood pressure is at target.  No change in therapy.   HYPERLIPIDEMIA: LDL was 56 with HDL of 65.  No change in therapy.   DIZZINESS:   She is no longer having this.  No further work up.    Current medicines are reviewed at length with the patient today.  The patient does not have concerns regarding medicines.  The  following changes have been made:  no change  Labs/ tests ordered today include:   Orders Placed This Encounter  Procedures  . EKG 12-Lead     Disposition:   FU with me in one year.     Signed, Minus Breeding, MD  11/29/2020 8:26 AM    [facility]

## 2020-11-29 ENCOUNTER — Encounter: Payer: Self-pay | Admitting: Cardiology

## 2020-11-29 ENCOUNTER — Ambulatory Visit: Payer: Medicare Other | Admitting: Cardiology

## 2020-11-29 ENCOUNTER — Other Ambulatory Visit: Payer: Self-pay

## 2020-11-29 VITALS — BP 130/64 | HR 53 | Ht 63.0 in | Wt 136.4 lb

## 2020-11-29 DIAGNOSIS — E785 Hyperlipidemia, unspecified: Secondary | ICD-10-CM | POA: Diagnosis not present

## 2020-11-29 DIAGNOSIS — I2510 Atherosclerotic heart disease of native coronary artery without angina pectoris: Secondary | ICD-10-CM | POA: Diagnosis not present

## 2020-11-29 DIAGNOSIS — I10 Essential (primary) hypertension: Secondary | ICD-10-CM

## 2020-11-29 NOTE — Patient Instructions (Signed)
Medication Instructions:  No changes *If you need a refill on your cardiac medications before your next appointment, please call your pharmacy*  Follow-Up: At [facility], you and your health needs are our priority.  As part of our continuing mission to provide you with exceptional heart care, we have created designated Provider Care Teams.  These Care Teams include your primary Cardiologist (physician) and Advanced Practice Providers (APPs -  Physician Assistants and Nurse Practitioners) who all work together to provide you with the care you need, when you need it.  Your next appointment:   12 month(s) You will receive a reminder letter in the mail two months in advance. If you don't receive a letter, please call our office to schedule the follow-up appointment.  The format for your next appointment:   In Person  Provider:   James Hochrein, MD   

## 2020-12-16 ENCOUNTER — Ambulatory Visit: Payer: Medicare Other | Admitting: Dermatology

## 2020-12-23 ENCOUNTER — Other Ambulatory Visit: Payer: Self-pay

## 2020-12-23 ENCOUNTER — Ambulatory Visit: Payer: Medicare Other | Admitting: Dermatology

## 2020-12-23 ENCOUNTER — Encounter: Payer: Self-pay | Admitting: Dermatology

## 2020-12-23 DIAGNOSIS — Z85828 Personal history of other malignant neoplasm of skin: Secondary | ICD-10-CM | POA: Diagnosis not present

## 2020-12-23 DIAGNOSIS — L570 Actinic keratosis: Secondary | ICD-10-CM

## 2020-12-23 DIAGNOSIS — D0439 Carcinoma in situ of skin of other parts of face: Secondary | ICD-10-CM | POA: Diagnosis not present

## 2020-12-23 DIAGNOSIS — Z8589 Personal history of malignant neoplasm of other organs and systems: Secondary | ICD-10-CM

## 2020-12-23 DIAGNOSIS — C44629 Squamous cell carcinoma of skin of left upper limb, including shoulder: Secondary | ICD-10-CM | POA: Diagnosis not present

## 2020-12-23 DIAGNOSIS — D485 Neoplasm of uncertain behavior of skin: Secondary | ICD-10-CM

## 2020-12-23 DIAGNOSIS — Z86018 Personal history of other benign neoplasm: Secondary | ICD-10-CM | POA: Diagnosis not present

## 2020-12-23 DIAGNOSIS — Z1283 Encounter for screening for malignant neoplasm of skin: Secondary | ICD-10-CM

## 2020-12-23 DIAGNOSIS — Z86007 Personal history of in-situ neoplasm of skin: Secondary | ICD-10-CM

## 2020-12-23 DIAGNOSIS — L821 Other seborrheic keratosis: Secondary | ICD-10-CM | POA: Diagnosis not present

## 2020-12-23 NOTE — Patient Instructions (Signed)

## 2020-12-30 ENCOUNTER — Telehealth: Payer: Self-pay | Admitting: Dermatology

## 2020-12-30 NOTE — Telephone Encounter (Signed)
Patient is calling for pathology results from last visit with Stuart Tafeen, MD 

## 2020-12-30 NOTE — Telephone Encounter (Signed)
Called to let patient know that pathology results aren't not back; try again next week.

## 2021-01-01 ENCOUNTER — Telehealth: Payer: Self-pay | Admitting: *Deleted

## 2021-01-01 NOTE — Telephone Encounter (Signed)
-----   Message from Lavonna Monarch, MD sent at 01/01/2021  4:38 AM EDT ----- Schedule surgery with Dr. Darene Lamer

## 2021-01-01 NOTE — Telephone Encounter (Signed)
Left message for patient to return our phone call.

## 2021-01-02 ENCOUNTER — Telehealth: Payer: Self-pay | Admitting: *Deleted

## 2021-01-02 NOTE — Telephone Encounter (Signed)
-----   Message from Lavonna Monarch, MD sent at 01/01/2021  4:38 AM EDT ----- Schedule surgery with Dr. Darene Lamer

## 2021-01-02 NOTE — Telephone Encounter (Signed)
Pathology to patient-surgery appointment scheduled.  

## 2021-01-03 ENCOUNTER — Encounter: Payer: Self-pay | Admitting: Dermatology

## 2021-01-03 NOTE — Progress Notes (Signed)
Follow-Up Visit   Subjective  [name] is a 85 y.o. adult who presents for the following: Annual Exam (Spots on arms, legs, neck and under right breast ).  Multiple spots to check Location:  Duration:  Quality:  Associated Signs/Symptoms: Modifying Factors:  Severity:  Timing: Context: She had multiple skin cancers  Objective  Well appearing patient in no apparent distress; mood and affect are within normal limits. Objective  Head - Anterior (Face): Waist up skin examination, no atypical pigmented lesions.  Objective  Mid Nose: No sign recurrence  Objective  Left Chest, Left Hand - Posterior: No sign recurrence  Objective  Right Wrist - Posterior: No sign recurrence  Objective  Right Shoulder - Posterior: No sign recurrence  Objective  Left 3rd Finger Metacarpophalangeal Joint: Verrucous 7 mm crust       Objective  Right Buccal Cheek: 6 mm waxy pink crust with focal erosion       Objective  Left Forearm - Posterior: 8 mm thick pink crust       Objective  Right Inframammary Fold: 1 cm flat pink crust, rule out superficial BCC       All skin waist up examined.  Areas under undergarments not fully examined.   Assessment & Plan    Skin exam for malignant neoplasm Head - Anterior (Face)  Yearly skin check  History of basal cell carcinoma (BCC) Mid Nose  Recheck as needed  History of squamous cell carcinoma in situ (SCCIS) of skin (2) Left Hand - Posterior; Left Chest  Recheck as needed  History of squamous cell carcinoma Right Wrist - Posterior  History of atypical skin mole Right Shoulder - Posterior  Neoplasm of uncertain behavior of skin (4) Left 3rd Finger Metacarpophalangeal Joint  Skin / nail biopsy Type of biopsy: tangential   Informed consent: discussed and consent obtained   Timeout: patient name, date of birth, surgical site, and procedure verified   Procedure prep:  Patient was prepped and  draped in usual sterile fashion (Non sterile) Prep type:  Chlorhexidine Anesthesia: the lesion was anesthetized in a standard fashion   Anesthetic:  1% lidocaine w/ epinephrine 1-100,000 local infiltration Instrument used: flexible razor blade   Hemostasis achieved with: ferric subsulfate   Outcome: patient tolerated procedure well   Post-procedure details: sterile dressing applied and wound care instructions given   Dressing type: bandage and pressure dressing    Specimen 1 - Surgical pathology Differential Diagnosis: R/O BCC vs SCC  Check Margins: No  Right Buccal Cheek  Skin / nail biopsy Type of biopsy: tangential   Informed consent: discussed and consent obtained   Timeout: patient name, date of birth, surgical site, and procedure verified   Procedure prep:  Patient was prepped and draped in usual sterile fashion (Non sterile) Prep type:  Chlorhexidine Anesthesia: the lesion was anesthetized in a standard fashion   Anesthetic:  1% lidocaine w/ epinephrine 1-100,000 local infiltration Instrument used: flexible razor blade   Hemostasis achieved with: ferric subsulfate   Outcome: patient tolerated procedure well   Post-procedure details: sterile dressing applied and wound care instructions given   Dressing type: petrolatum and bandage    Specimen 2 - Surgical pathology Differential Diagnosis: R/O BCC vs SCC  Check Margins: No  Left Forearm - Posterior  Skin / nail biopsy Type of biopsy: tangential   Informed consent: discussed and consent obtained   Timeout: patient name, date of birth, surgical site, and procedure verified   Procedure prep:  Patient was prepped and draped in usual sterile fashion (Non sterile) Prep type:  Chlorhexidine Anesthesia: the lesion was anesthetized in a standard fashion   Anesthetic:  1% lidocaine w/ epinephrine 1-100,000 local infiltration Instrument used: flexible razor blade   Hemostasis achieved with: ferric subsulfate   Outcome:  patient tolerated procedure well   Post-procedure details: sterile dressing applied and wound care instructions given   Dressing type: bandage and petrolatum    Specimen 3 - Surgical pathology Differential Diagnosis: R/O BCC vs SCC  Check Margins: No  Right Inframammary Fold  Skin / nail biopsy Type of biopsy: tangential   Informed consent: discussed and consent obtained   Timeout: patient name, date of birth, surgical site, and procedure verified   Procedure prep:  Patient was prepped and draped in usual sterile fashion (Non sterile) Prep type:  Chlorhexidine Anesthesia: the lesion was anesthetized in a standard fashion   Anesthetic:  1% lidocaine w/ epinephrine 1-100,000 local infiltration Instrument used: flexible razor blade   Hemostasis achieved with: ferric subsulfate   Outcome: patient tolerated procedure well   Post-procedure details: sterile dressing applied and wound care instructions given   Dressing type: bandage and petrolatum    Specimen 4 - Surgical pathology Differential Diagnosis: R/O BCC vs SCC  Check Margins: No      I, Lavonna Monarch, MD, have reviewed all documentation for this visit.  The documentation on 01/03/21 for the exam, diagnosis, procedures, and orders are all accurate and complete.

## 2021-01-22 ENCOUNTER — Encounter: Payer: Self-pay | Admitting: Dermatology

## 2021-01-22 ENCOUNTER — Ambulatory Visit (INDEPENDENT_AMBULATORY_CARE_PROVIDER_SITE_OTHER): Payer: Medicare Other | Admitting: Dermatology

## 2021-01-22 ENCOUNTER — Other Ambulatory Visit: Payer: Self-pay

## 2021-01-22 DIAGNOSIS — D099 Carcinoma in situ, unspecified: Secondary | ICD-10-CM

## 2021-01-22 DIAGNOSIS — C4492 Squamous cell carcinoma of skin, unspecified: Secondary | ICD-10-CM

## 2021-01-22 DIAGNOSIS — D0439 Carcinoma in situ of skin of other parts of face: Secondary | ICD-10-CM

## 2021-01-22 DIAGNOSIS — C44629 Squamous cell carcinoma of skin of left upper limb, including shoulder: Secondary | ICD-10-CM

## 2021-01-22 NOTE — Patient Instructions (Signed)

## 2021-02-01 ENCOUNTER — Encounter: Payer: Self-pay | Admitting: Dermatology

## 2021-02-01 NOTE — Progress Notes (Signed)
   Follow-Up Visit   Subjective  [name] is a 85 y.o. adult who presents for the following: Procedure (Here for treatment- right buccal cheek & left forearm posterior- scc x 2).  Skin cancers right cheek and left arm Location:  Duration:  Quality:  Associated Signs/Symptoms: Modifying Factors:  Severity:  Timing: Context: For treatment  Objective  Well appearing patient in no apparent distress; mood and affect are within normal limits. Objective  Right Buccal Cheek: Lesion identified by Dr.Rovena Hearld and nurse in room.    Objective  Left Forearm - Posterior: Biopsy site identified by nurse, patient, and me.    A focused examination was performed including Head, neck, arms. Relevant physical exam findings are noted in the Assessment and Plan.   Assessment & Plan    Squamous cell carcinoma in situ Right Buccal Cheek  Destruction of lesion Complexity: simple   Destruction method: electrodesiccation and curettage   Informed consent: discussed and consent obtained   Timeout:  patient name, date of birth, surgical site, and procedure verified Anesthesia: the lesion was anesthetized in a standard fashion   Anesthetic:  1% lidocaine w/ epinephrine 1-100,000 local infiltration Curettage performed in three different directions: Yes   Electrodesiccation performed over the curetted area: Yes   Curettage cycles:  3 Lesion length (cm):  2.3 Lesion width (cm):  2.3 Margin per side (cm):  0 Final wound size (cm):  2.3 Hemostasis achieved with:  ferric subsulfate Outcome: patient tolerated procedure well with no complications   Additional details:  Wound innoculated with 5 fluorouracil solution.  SCCA (squamous cell carcinoma) of skin Left Forearm - Posterior  Destruction of lesion Complexity: simple   Destruction method: electrodesiccation and curettage   Informed consent: discussed and consent obtained   Timeout:  patient name, date of birth, surgical site, and  procedure verified Anesthesia: the lesion was anesthetized in a standard fashion   Anesthetic:  1% lidocaine w/ epinephrine 1-100,000 local infiltration Curettage performed in three different directions: Yes   Electrodesiccation performed over the curetted area: Yes   Curettage cycles:  3 Lesion length (cm):  2 Lesion width (cm):  2 Margin per side (cm):  0 Final wound size (cm):  2 Hemostasis achieved with:  ferric subsulfate Outcome: patient tolerated procedure well with no complications   Additional details:  Wound innoculated with 5 fluorouracil solution.      I, Lavonna Monarch, MD, have reviewed all documentation for this visit.  The documentation on 02/01/21 for the exam, diagnosis, procedures, and orders are all accurate and complete.

## 2021-02-04 ENCOUNTER — Telehealth: Payer: Self-pay | Admitting: Dermatology

## 2021-02-04 NOTE — Telephone Encounter (Signed)
Patient is calling with a 3 week progress report.  Patient says that the surgical sites are healing well with no problems.

## 2021-02-13 ENCOUNTER — Encounter: Payer: Medicare Other | Admitting: Dermatology

## 2021-02-17 ENCOUNTER — Encounter (INDEPENDENT_AMBULATORY_CARE_PROVIDER_SITE_OTHER): Payer: Medicare Other | Admitting: Ophthalmology

## 2021-02-22 ENCOUNTER — Other Ambulatory Visit: Payer: Self-pay | Admitting: Cardiology

## 2021-03-04 NOTE — Telephone Encounter (Signed)
Patient called 02/04/2021 to let us know that surgical sites healing well.

## 2021-03-05 ENCOUNTER — Ambulatory Visit (INDEPENDENT_AMBULATORY_CARE_PROVIDER_SITE_OTHER): Payer: Medicare Other | Admitting: Ophthalmology

## 2021-03-05 ENCOUNTER — Encounter (INDEPENDENT_AMBULATORY_CARE_PROVIDER_SITE_OTHER): Payer: Self-pay | Admitting: Ophthalmology

## 2021-03-05 ENCOUNTER — Other Ambulatory Visit: Payer: Self-pay

## 2021-03-05 DIAGNOSIS — H353131 Nonexudative age-related macular degeneration, bilateral, early dry stage: Secondary | ICD-10-CM | POA: Diagnosis not present

## 2021-03-05 DIAGNOSIS — H353132 Nonexudative age-related macular degeneration, bilateral, intermediate dry stage: Secondary | ICD-10-CM

## 2021-03-05 DIAGNOSIS — H34832 Tributary (branch) retinal vein occlusion, left eye, with macular edema: Secondary | ICD-10-CM

## 2021-03-05 DIAGNOSIS — H348322 Tributary (branch) retinal vein occlusion, left eye, stable: Secondary | ICD-10-CM

## 2021-03-05 NOTE — Assessment & Plan Note (Addendum)
History of BRVO OS with CME,, no signs of recurrences will observe

## 2021-03-05 NOTE — Assessment & Plan Note (Signed)

## 2021-03-05 NOTE — Progress Notes (Signed)
03/05/2021     CHIEF COMPLAINT Patient presents for Retina Evaluation (Follow-up for history of branch retinal vein occlusion with macular edema left eye, with history of prior treatment yet no active disease on last follow-up examination 6 months previous months, as well as no active disease noted on fluorescein angiography 10 months previous)   HISTORY OF PRESENT ILLNESS: [name] is a 85 y.o. adult who presents to the clinic today for:   HPI    Retina Evaluation    Laterality: left eye   Onset: years ago   Comments: Follow-up for history of branch retinal vein occlusion with macular edema left eye, with history of prior treatment yet no active disease on last follow-up examination 6 months previous months, as well as no active disease noted on fluorescein angiography 10 months previous       Last edited by Hurman Horn, MD on 03/05/2021 10:22 AM. (History)      Referring physician: Reynold Bowen, MD [place],  [place] [address]  HISTORICAL INFORMATION:   Selected notes from the [facility]: Current Outpatient Medications (Ophthalmic Drugs)  Medication Sig  . Tetrahydrozoline-Zn Sulfate (EYE DROPS A/C OP) Place 1 drop into both eyes every 6 (six) hours as needed (for dry eyes).   No current facility-administered medications for this visit. (Ophthalmic Drugs)   Current Outpatient Medications (Other)  Medication Sig  . Acetaminophen (TYLENOL PO) Take 500 mg by mouth every 6 (six) hours as needed (pain).  Marland Kitchen amLODipine (NORVASC) 5 MG tablet TAKE 1 AND 1/2 TABLETS(7.5 MG) BY MOUTH DAILY  . aspirin 81 MG tablet Take 81 mg by mouth daily.  Marland Kitchen atorvastatin (LIPITOR) 40 MG tablet Take 40 mg by mouth daily.  . Calcium Carbonate-Vitamin D (CALCIUM + D PO) Take 1 tablet by mouth 2 (two) times daily.  . clopidogrel (PLAVIX) 75 MG tablet Take 75 mg by mouth daily.  . fish oil-omega-3 fatty acids 1000 MG capsule Take 1,200 g by  mouth daily.  . isosorbide mononitrate (IMDUR) 30 MG 24 hr tablet Take 30 mg by mouth daily.  Marland Kitchen levothyroxine (SYNTHROID, LEVOTHROID) 88 MCG tablet Take 1 tablet (88 mcg total) by mouth daily before breakfast.  . losartan (COZAAR) 50 MG tablet Take 1 tablet (50 mg total) by mouth 2 (two) times daily.  . metoprolol succinate (TOPROL-XL) 100 MG 24 hr tablet Take 100 mg by mouth 2 (two) times daily. Take with or immediately following a meal.  . Multiple Vitamins-Minerals (CENTRUM SILVER 50+WOMEN) TABS Take 1 tablet by mouth daily.  . Multiple Vitamins-Minerals (OCUVITE PRESERVISION PO) Take by mouth 2 (two) times daily.  . nitroGLYCERIN (NITROSTAT) 0.4 MG SL tablet Place 1 tablet (0.4 mg total) under the tongue every 5 (five) minutes as needed.   No current facility-administered medications for this visit. (Other)      REVIEW OF SYSTEMS:    ALLERGIES Allergies  Allergen Reactions  . Hydrocodone Other (See Comments)    "feels weird"  . Prednisone Nausea And Vomiting    Oral    PAST MEDICAL HISTORY Past Medical History:  Diagnosis Date  . CAD, UNSPECIFIED SITE    2009. 99% proximal RCA stenosis followed by 80% distal stenosis. The LAD has a 100% stenosis in the midsegment. The circumflex is patent with distal occlusion. She has been managed medically. Her EF was well-preserved.  Marland Kitchen CAROTID STENOSIS   . DIVERTICULAR DISEASE   . Essential  hypertension, benign   . Gallstones 2009  . Gastritis   . H/O blood clots 1984   DVT and PE  . Hx of degenerative disc disease   . HYPERLIPIDEMIA   . MACULAR DEGENERATION   . OSTEOPOROSIS   . Pneumonia   . Rheumatoid arthritis(714.0)   . Thyroid cancer ([place]) 2008   Past Surgical History:  Procedure Laterality Date  . ABDOMINAL HYSTERECTOMY  1984  . APPENDECTOMY  1959  . BREAST CYST EXCISION     left  . CATARACT EXTRACTION, BILATERAL    . CHOLECYSTECTOMY  2009  . COLONOSCOPY    . ESOPHAGOGASTRODUODENOSCOPY    . IM NAILING FEMORAL  SHAFT RETROGRADE  2010   left  . TONSILLECTOMY    . WRIST SURGERY  2004   Left    FAMILY HISTORY Family History  Problem Relation Age of Onset  . Heart attack Mother   . Clotting disorder Mother   . Heart disease Father   . Diabetes Father   . Ovarian cancer Maternal Aunt   . Lung cancer Brother   . CAD Sister   . Diabetes Niece   . Lung cancer Maternal Uncle     SOCIAL HISTORY Social History   Tobacco Use  . Smoking status: Never Smoker  . Smokeless tobacco: Never Used  Vaping Use  . Vaping Use: Never used  Substance Use Topics  . Alcohol use: No  . Drug use: No         OPHTHALMIC EXAM: Base Eye Exam    Visual Acuity (ETDRS)      Right Left   Dist [place] 20/40 +1 20/25 -1   Dist ph [place] 20/25 -1 NI       Tonometry (Tonopen, 10:21 AM)      Right Left   Pressure 12 12       Neuro/Psych    Oriented x3: Yes   Mood/Affect: Normal       Dilation    Both eyes: 1.0% Mydriacyl, 2.5% Phenylephrine @ 10:21 AM        Slit Lamp and Fundus Exam    External Exam      Right Left   External Normal Normal       Slit Lamp Exam      Right Left   Lids/Lashes Normal Normal   Conjunctiva/Sclera White and quiet White and quiet   Cornea Clear Clear   Anterior Chamber Deep and quiet Deep and quiet   Iris Round and reactive Round and reactive   Lens Posterior chamber intraocular lens, Centered posterior chamber intraocular lens Posterior chamber intraocular lens, Centered posterior chamber intraocular lens   Anterior Vitreous Normal Normal       Fundus Exam      Right Left   Posterior Vitreous Normal Normal   Disc Normal Normal   C/D Ratio 0.4 0.45   Macula no macular thickening, Drusen, Hard drusen, no hemorrhage, Geographic atrophy Microaneurysms, no macular thickening, Hard drusen   Vessels Normal old brvo inf macula   Periphery Normal Normal          IMAGING AND PROCEDURES  Imaging and Procedures for 03/05/21  Color Fundus Photography Optos - OU - Both  Eyes       Right Eye Progression has been stable. Disc findings include normal observations. Macula : drusen, geographic atrophy, retinal pigment epithelium abnormalities.   Left Eye Progression has been stable. Disc findings include normal observations. Macula : drusen, retinal pigment epithelium abnormalities, microaneurysms.  Notes OD with areas of geographic atrophy, hard drusen, no active CNVM.  Posterior vitreous detachment noted incidentally.  OS, old branch retinal vein occlusion, no evidence of collateralization.  Good clear view of the                ASSESSMENT/PLAN:  Stable branch retinal vein occlusion of left eye History of BRVO OS with CME,, no signs of recurrences will observe  Intermediate stage nonexudative age-related macular degeneration of both eyes The nature of age--related macular degeneration was discussed with the patient as well as the distinction between dry and wet types. Checking an Amsler Grid daily with advice to return immediately should a distortion develop, was given to the patient. The patient 's smoking status now and in the past was determined and advice based on the AREDS study was provided regarding the consumption of antioxidant supplements. AREDS 2 vitamin formulation was recommended. Consumption of dark leafy vegetables and fresh fruits of various colors was recommended. Treatment modalities for wet macular degeneration particularly the use of intravitreal injections of anti-blood vessel growth factors was discussed with the patient. Avastin, Lucentis, and Eylea are the available options. On occasion, therapy includes the use of photodynamic therapy and thermal laser. Stressed to the patient do not rub eyes.  Patient was advised to check Amsler Grid daily and return immediately if changes are noted. Instructions on using the grid were given to the patient. All patient questions were answered.      ICD-10-CM   1. Branch retinal vein occlusion  with macular edema of left eye  H34.8320 Color Fundus Photography Optos - OU - Both Eyes  2. Early stage nonexudative age-related macular degeneration of both eyes  H35.3131 Color Fundus Photography Optos - OU - Both Eyes  3. Stable branch retinal vein occlusion of left eye  H34.8322   4. Intermediate stage nonexudative age-related macular degeneration of both eyes  H35.3132     1.  No signs of active maculopathy OU.  Mild to moderate ARMD OU.  2.  History of BRVO OS, quiescent disease no active complications observed  3.  Ophthalmic Meds Ordered this visit:  No orders of the defined types were placed in this encounter.      Return in about 6 months (around 09/04/2021) for DILATE OU, OCT.  There are no Patient Instructions on file for this visit.   Explained the diagnoses, plan, and follow up with the patient and they expressed understanding.  Patient expressed understanding of the importance of proper follow up care.   Clent Demark Raylei Losurdo M.D. Diseases & Surgery of the Retina and Vitreous [facility]ina [facility] Diabetic [facility] 03/05/21     Abbreviations: M myopia (nearsighted); A astigmatism; H hyperopia (farsighted); P presbyopia; Mrx spectacle prescription;  CTL contact lenses; OD right eye; OS left eye; OU both eyes  XT exotropia; ET esotropia; PEK punctate epithelial keratitis; PEE punctate epithelial erosions; DES dry eye syndrome; MGD meibomian gland dysfunction; ATs artificial tears; PFAT's preservative free artificial tears; [place] nuclear sclerotic cataract; PSC posterior subcapsular cataract; ERM epi-retinal membrane; PVD posterior vitreous detachment; RD retinal detachment; DM diabetes mellitus; DR diabetic retinopathy; NPDR non-proliferative diabetic retinopathy; PDR proliferative diabetic retinopathy; CSME clinically significant macular edema; DME diabetic macular edema; dbh dot blot hemorrhages; CWS cotton wool spot; POAG primary open angle glaucoma; C/D cup-to-disc ratio; HVF humphrey  visual field; GVF goldmann visual field; OCT optical coherence tomography; IOP intraocular pressure; BRVO Branch retinal vein occlusion; CRVO central retinal vein occlusion; CRAO central retinal  artery occlusion; BRAO branch retinal artery occlusion; RT retinal tear; SB scleral buckle; PPV pars plana vitrectomy; VH Vitreous hemorrhage; PRP panretinal laser photocoagulation; IVK intravitreal kenalog; VMT vitreomacular traction; MH Macular hole;  NVD neovascularization of the disc; NVE neovascularization elsewhere; AREDS age related eye disease study; ARMD age related macular degeneration; POAG primary open angle glaucoma; EBMD epithelial/anterior basement membrane dystrophy; ACIOL anterior chamber intraocular lens; IOL intraocular lens; PCIOL posterior chamber intraocular lens; Phaco/IOL phacoemulsification with intraocular lens placement; [place] photorefractive keratectomy; LASIK laser assisted in situ keratomileusis; HTN hypertension; DM diabetes mellitus; COPD chronic obstructive pulmonary disease

## 2021-03-27 ENCOUNTER — Telehealth: Payer: Self-pay | Admitting: Cardiology

## 2021-03-27 NOTE — Telephone Encounter (Signed)
   [facility] Pre-operative Risk Assessment    Request for surgical clearance:  What type of surgery is being performed? Tooth attraction   When is this surgery scheduled? 03/31/2021   What type of clearance is required (medical clearance vs. Pharmacy clearance to hold med vs. Both)? medical  Are there any medications that need to be held prior to surgery and how long? Plavix 2 days prior   Practice name and name of physician performing surgery? Dr. Marnee Spring   What is your office phone number [phone]    7.   What is your office fax number [phone]  8.   Anesthesia type (None, local, MAC, general) ? local   Milbert Coulter 03/27/2021, 3:23 PM  _________________________________________________________________   (provider comments below)

## 2021-03-28 ENCOUNTER — Telehealth: Payer: Self-pay | Admitting: General Practice

## 2021-03-28 NOTE — Telephone Encounter (Signed)
   Primary Cardiologist: Minus Breeding, MD  Chart reviewed as part of pre-operative protocol coverage. Simple dental extractions are considered low risk procedures per guidelines and generally do not require any specific cardiac clearance. It is also generally accepted that for simple extractions and dental cleanings, there is no need to interrupt blood thinner therapy.   SBE prophylaxis is not required for the patient.  I will route this recommendation to the requesting party via Epic fax function and remove from pre-op pool.  Please call with questions.  Deberah Pelton, NP 03/28/2021, 7:00 AM

## 2021-03-28 NOTE — Telephone Encounter (Signed)
New dental surgery details discussed with DOD (Dr. Percival Spanish).  He has approved 2-day Plavix hold but would like to have aspirin continued through procedure.  I will addend previous preoperative cardiac evaluation and recent to request in office.

## 2021-03-28 NOTE — Telephone Encounter (Signed)
       Primary Cardiologist: Minus Breeding, MD  Chart reviewed as part of pre-operative protocol coverage. Given past medical history and time since last visit, based on ACC/AHA guidelines, [name] would be at acceptable risk for the planned procedure without further cardiovascular testing.   Her Plavix may be held for 2 days prior to her procedure.  Please resume Plavix as soon as hemostasis is achieved.  Dr. Percival Spanish would like her to continue her aspirin throughout her procedure.  I will route this recommendation to the requesting party via Epic fax function and remove from pre-op pool.  Please call with questions.  Jossie Ng. Breylan Lefevers NP-C    03/28/2021, 1:03 PM [address] Office [phone] Fax [phone]

## 2021-03-28 NOTE — Telephone Encounter (Signed)
Follow Up:      Margreta Journey from Dr Jetta Lout 's office need you to call her, concerning pt's clearance.

## 2021-04-09 DIAGNOSIS — I700 Atherosclerosis of aorta: Secondary | ICD-10-CM | POA: Diagnosis not present

## 2021-04-09 DIAGNOSIS — Z1231 Encounter for screening mammogram for malignant neoplasm of breast: Secondary | ICD-10-CM | POA: Diagnosis not present

## 2021-04-09 DIAGNOSIS — E871 Hypo-osmolality and hyponatremia: Secondary | ICD-10-CM | POA: Diagnosis not present

## 2021-04-09 DIAGNOSIS — C73 Malignant neoplasm of thyroid gland: Secondary | ICD-10-CM | POA: Diagnosis not present

## 2021-04-09 DIAGNOSIS — R779 Abnormality of plasma protein, unspecified: Secondary | ICD-10-CM | POA: Diagnosis not present

## 2021-04-09 DIAGNOSIS — E785 Hyperlipidemia, unspecified: Secondary | ICD-10-CM | POA: Diagnosis not present

## 2021-04-09 DIAGNOSIS — E890 Postprocedural hypothyroidism: Secondary | ICD-10-CM | POA: Diagnosis not present

## 2021-04-15 ENCOUNTER — Encounter: Payer: Self-pay | Admitting: Neurology

## 2021-04-17 DIAGNOSIS — H348322 Tributary (branch) retinal vein occlusion, left eye, stable: Secondary | ICD-10-CM | POA: Diagnosis not present

## 2021-04-17 DIAGNOSIS — H1045 Other chronic allergic conjunctivitis: Secondary | ICD-10-CM | POA: Diagnosis not present

## 2021-04-17 DIAGNOSIS — H353132 Nonexudative age-related macular degeneration, bilateral, intermediate dry stage: Secondary | ICD-10-CM | POA: Diagnosis not present

## 2021-04-17 DIAGNOSIS — H11823 Conjunctivochalasis, bilateral: Secondary | ICD-10-CM | POA: Diagnosis not present

## 2021-04-22 DIAGNOSIS — M810 Age-related osteoporosis without current pathological fracture: Secondary | ICD-10-CM | POA: Diagnosis not present

## 2021-05-01 NOTE — Progress Notes (Signed)
Assessment/Plan:   1.  Essential Tremor.  -This is evidenced by the symmetrical nature and longstanding hx of gradually getting worse.  We discussed nature and pathophysiology.  We discussed that this can continue to gradually get worse with time.  We discussed that some medications can worsen this, as can caffeine use.  We discussed medication therapy as well as surgical therapy.  We discussed in detail primidone along with risks and benefits.  She is already on metoprolol.  She is on a fairly good dose of metoprolol.  We discussed that potentially changing this to propranolol could help, but I would certainly leave that to the discretion of her other physicians.  Ultimately, after some discussion, she decided that she really did not want to add or change things.  She has developed a rest component to the tremor, but has no other features of Parkinson's disease.  She can certainly let us know if she needs [place] in the future and we would be happy to see her back.  Patient education provided today.   Subjective:   [name] was seen in consultation in the movement disorder clinic at the request of Reynold Bowen, MD.  The evaluation is for tremor.  Medical records from primary care reviewed.  Pt with husband who supplements the history.  I saw the patient in 2019 for the same.  Patient's tremor has been going on over 5 years.  At that point in time, she really did not want to take any further medications. Pt states that tremor has gotten worse with time and "its continuous." Tremor is most noticeable when eating, writing.   Tremor is better in the AM than later in the day.  Both hands shake equally.  There is a family hx of tremor in her sister.    Affected by caffeine:  No. (Doesn't drink enough to know) Affected by alcohol: Unknown, does not drink alcohol Affected by stress:  Yes.   Affected by fatigue:  may be Spills soup if on spoon:  No. But barely and has been tempted to use cup for  soup Affects ADL's (tying shoes, brushing teeth, etc):  No.  Current/Previously tried tremor medications: On metoprolol and cannot go higher on beta-blocker therapy  Current medications that may exacerbate tremor:  n/a  Outside reports reviewed: historical medical records, office notes, and referral letter/letters.  Allergies  Allergen Reactions   Hydrocodone Other (See Comments)    "feels weird"   Prednisone Nausea And Vomiting    Oral    Current Outpatient Medications  Medication Instructions   Acetaminophen (TYLENOL PO) 500 mg, Oral, Every 6 hours PRN   amLODipine (NORVASC) 5 MG tablet TAKE 1 AND 1/2 TABLETS(7.5 MG) BY MOUTH DAILY   aspirin 81 mg, Oral, Daily,     atorvastatin (LIPITOR) 40 mg, Oral, Daily,     Calcium Carbonate-Vitamin D (CALCIUM + D PO) 1 tablet, Oral, 2 times daily   clopidogrel (PLAVIX) 75 mg, Oral, Daily,     CYSTINE PO Oral   fish oil-omega-3 fatty acids 1,200 g, Oral, Daily   isosorbide mononitrate (IMDUR) 30 mg, Oral, Daily,     levothyroxine (SYNTHROID) 88 mcg, Oral, Daily before breakfast   losartan (COZAAR) 50 mg, Oral, 2 times daily   metoprolol succinate (TOPROL-XL) 100 mg, Oral, 2 times daily, Take with or immediately following a meal.    Multiple Vitamins-Minerals (CENTRUM SILVER 50+WOMEN) TABS 1 tablet, Oral, Daily   Multiple Vitamins-Minerals (OCUVITE PRESERVISION PO) Oral, 2 times daily,  nitroGLYCERIN (NITROSTAT) 0.4 mg, Sublingual, Every 5 min PRN   olopatadine (PATADAY) 0.1 % ophthalmic solution 1 drop, 2 times daily   Tetrahydrozoline-Zn Sulfate (EYE DROPS A/C OP) 1 drop, Every 6 hours PRN     Objective:   VITALS:   Vitals:   05/06/21 0833  BP: 127/74  Pulse: 74  SpO2: 98%  Weight: 133 lb (60.3 kg)  Height: '5\' 3"'$  (1.6 m)   Gen:  Appears stated age and in NAD. HEENT:  Normocephalic, atraumatic. The mucous membranes are moist. The superficial temporal arteries are without ropiness or tenderness. Cardiovascular: Regular rate  and rhythm. Lungs: Clear to auscultation bilaterally. Neck: There are no carotid bruits noted bilaterally.  NEUROLOGICAL:  Orientation:  The patient is alert and oriented x 3.   Cranial nerves: There is good facial symmetry. Extraocular muscles are intact and visual fields are full to confrontational testing. Speech is fluent and clear. Soft palate rises symmetrically and there is no tongue deviation. Hearing is intact to conversational tone. Tone: Tone is good throughout. Sensation: Sensation is intact to light touch touch throughout (facial, trunk, extremities). Vibration is intact at the bilateral big toe. There is no extinction with double simultaneous stimulation. There is no sensory dermatomal level identified. Coordination:  The patient has no dysdiadichokinesia or dysmetria. Motor: Strength is 5/5 in the bilateral upper and lower extremities.  Shoulder shrug is equal bilaterally.  There is no pronator drift.  There are no fasciculations noted. Gait and Station: The patient is able to ambulate without difficulty. The patient is able to heel toe walk without any difficulty. The patient is able to ambulate in a tandem fashion. The patient is able to stand in the Romberg position.   MOVEMENT EXAM: Tremor:  There is nl tremor in the UE, noted most significantly with action.  The left is worse than the right.  There is tremor with Archimedes spirals, but it does not look worse than in 2019 (actually a little better on the left).   There is a minor and intermittent tremor at rest in the L>R hand.  This is not worse with distraction.  She does have some trouble pouring water from 1 glass to another, but really is able to negotiate that fairly well.  She does not spill a lot.    I have reviewed and interpreted the following labs independently   Chemistry      Component Value Date/Time   NA 129 (L) 09/28/2018 0508   K 5.0 09/28/2018 0508   CL 88 (L) 09/28/2018 0508   CO2 25 09/28/2018 0508    BUN 21 09/28/2018 0508   CREATININE 0.85 09/28/2018 0508      Component Value Date/Time   CALCIUM 8.6 (L) 09/28/2018 0508   ALKPHOS 54 09/26/2018 0457   AST 36 09/26/2018 0457   ALT 22 09/26/2018 0457   BILITOT 0.9 09/26/2018 0457      Lab Results  Component Value Date   WBC 6.0 10/10/2018   HGB 12.4 10/10/2018   HCT 35.9 (L) 10/10/2018   MCV 94.0 10/10/2018   PLT 326.0 10/10/2018   Lab Results  Component Value Date   TSH 0.349 (L) 09/26/2018   Lab work done by primary care on October 08, 2020.  AST 32, ALT 25.  October 02, 2020, creatinine 0.8, sodium 141, potassium 5.3, chloride 105, CO2 24, BUN 16, TSH 0.57   Total time spent on today's visit was 22mnutes, including both face-to-face time and nonface-to-face time.  Time  included that spent on review of records (prior notes available to me/labs/imaging if pertinent), discussing treatment and goals, answering patient's questions and coordinating care.  CC:  Reynold Bowen, MD

## 2021-05-06 ENCOUNTER — Encounter: Payer: Self-pay | Admitting: Neurology

## 2021-05-06 ENCOUNTER — Ambulatory Visit: Payer: Medicare Other | Admitting: Neurology

## 2021-05-06 ENCOUNTER — Other Ambulatory Visit: Payer: Self-pay

## 2021-05-06 VITALS — BP 127/74 | HR 74 | Ht 63.0 in | Wt 133.0 lb

## 2021-05-06 DIAGNOSIS — G250 Essential tremor: Secondary | ICD-10-CM | POA: Diagnosis not present

## 2021-05-06 NOTE — Patient Instructions (Signed)
Good to see you today!  We discussed primidone and propranolol, but the propranolol is very similar to the metoprolol.  We decided to hold on any medication changes/additions for now.  The physicians and staff at [facility] are committed to providing excellent care. You may receive a survey requesting feedback about your experience at our office. We strive to receive "very good" responses to the survey questions. If you feel that your experience would prevent you from giving the office a "very good " response, please contact our office to try to remedy the situation. We may be reached at [phone]. Thank you for taking the time out of your busy day to complete the survey. Essential Tremor A tremor is trembling or shaking that a person cannot control. Most tremors affect the hands or arms. Tremors can also affect the head, vocal cords, legs, and other parts of the body. Essential tremor is a tremor without a known cause. Usually, it occurs while a person is trying to perform an action. Ittends to get worse gradually as a person ages. What are the causes? The cause of this condition is not known. What increases the risk? You are more likely to develop this condition if: You have a family member with essential tremor. You are age 83 or older. You take certain medicines. What are the signs or symptoms? The main sign of a tremor is a rhythmic shaking of certain parts of your body that is uncontrolled and unintentional. You may: Have difficulty eating with a spoon or fork. Have difficulty writing. Nod your head up and down or side to side. Have a quivering voice. The shaking may: Get worse over time. Come and go. Be more noticeable on one side of your body. Get worse due to stress, fatigue, caffeine, and extreme heat or cold. How is this diagnosed? This condition may be diagnosed based on: Your symptoms and medical history. A physical exam. There is no single test to diagnose an  essential tremor. However, your health care provider may order tests to rule out other causes of your condition. These may include: Blood and urine tests. Imaging studies of your brain, such as CT scan and MRI. A test that measures involuntary muscle movement (electromyogram). How is this treated? Treatment for essential tremor depends on the severity of the condition. Some tremors may go away without treatment. Mild tremors may not need treatment if they do not affect your day-to-day life. Severe tremors may need to be treated using one or more of the following options: Medicines. Lifestyle changes. Occupational or physical therapy. Follow these instructions at home: Lifestyle  Do not use any products that contain nicotine or tobacco, such as cigarettes and e-cigarettes. If you need help quitting, ask your health care provider. Limit your caffeine intake as told by your health care provider. Try to get 8 hours of sleep each night. Find ways to manage your stress that fits your lifestyle and personality. Consider trying meditation or yoga. Try to anticipate stressful situations and allow extra time to manage them. If you are struggling emotionally with the effects of your tremor, consider working with a mental health provider.  General instructions Take over-the-counter and prescription medicines only as told by your health care provider. Avoid extreme heat and extreme cold. Keep all follow-up visits as told by your health care provider. This is important. Visits may include physical therapy visits. Contact a health care provider if: You experience any changes in the location or intensity of your tremors.  You start having a tremor after starting a new medicine. You have tremor with other symptoms, such as: Numbness. Tingling. Pain. Weakness. Your tremor gets worse. Your tremor interferes with your daily life. You feel down, blue, or sad for at least 2 weeks in a row. Worrying  about your tremor and what other people think about you interferes with your everyday life functions, including relationships, work, or school. Summary Essential tremor is a tremor without a known cause. Usually, it occurs when you are trying to perform an action. You are more likely to develop this condition if you have a family member with essential tremor. The main sign of a tremor is a rhythmic shaking of certain parts of your body that is uncontrolled and unintentional. Treatment for essential tremor depends on the severity of the condition. This information is not intended to replace advice given to you by your health care provider. Make sure you discuss any questions you have with your healthcare provider. Document Revised: 06/14/2020 Document Reviewed: 06/14/2020 Elsevier Patient Education  2022 [employer].

## 2021-05-23 ENCOUNTER — Other Ambulatory Visit: Payer: Self-pay | Admitting: Cardiology

## 2021-06-12 DIAGNOSIS — Z23 Encounter for immunization: Secondary | ICD-10-CM | POA: Diagnosis not present

## 2021-07-02 ENCOUNTER — Telehealth: Payer: Self-pay | Admitting: Cardiology

## 2021-07-02 NOTE — Telephone Encounter (Signed)
Pt c/o Shortness Of Breath: STAT if SOB developed within the last 24 hours or pt is noticeably SOB on the phone  1. Are you currently SOB (can you hear that pt is SOB on the phone)?  YES FOR ABOUT 3 MONTHS. PT IS CURRENTLY SITTING DOWN AND SHE IS NOT NOTICEABLY SOB.  2. How long have you been experiencing SOB? 3 MONTHS  3. Are you SOB when sitting or when up moving around? UP MOVING AROUND  4. Are you currently experiencing any other symptoms? PT IS NOT EXPERIENCING ANY OTHER SYMPTOMS THAT SHE KNOWS ABOUT. PT HAD RECENT DENTAL WORK WITHIN THE LAST 3 MONTHS.

## 2021-07-02 NOTE — Telephone Encounter (Signed)
Appointment made for 10/6 with Dr.Hochrein. Patient agreeable

## 2021-07-02 NOTE — Telephone Encounter (Signed)
Called and spoke with the patient concerning her shortness of breath. She stated that she has been having shortness of breath for the past 3 months. She stated that the shortness of breath is with exertion only. She cannot do daily activities without having to stop and rest. She denies chest pain and denies swelling. She does wear compression stockings during the day.  She was offered an appointment with an APP but stated that she would prefer Dr. Percival Spanish.

## 2021-07-09 NOTE — Progress Notes (Signed)
Cardiology Office Note   Date:  07/10/2021   ID:  [name], DOB November 12, 1935, MRN [id number]  PCP:  Reynold Bowen, MD  Cardiologist:   Minus Breeding, MD   Chief Complaint  Patient presents with   Shortness of Breath      History of Present Illness: [name] is a 85 y.o. female who presents for follow up of CAD.  She was admitted to the hospital in December 2019 with bronchitis.  She had an elevated troponin which was thought to be demand ischemia.  She called recently with shortness of breath.  She is very anxious.  There is a great deal of stress going on at home.  She says that for months she has been getting worsening shortness of breath.  She gets dyspneic with mild activity.  She is not describing PND or orthopnea but she sleeps in a recliner secondary to varicose veins in her legs.  This has been chronic.  She is not having any new chest pressure, neck or arm discomfort.  She has no weight gain or edema.  Has had weight loss.  She is an increased frailty.  It looks like she is having trouble with her gait.  When she came in today she is noted to be in atrial fibrillation but she has not felt palpitations.  She has not had any presyncope or syncope.  She has not had this rhythm before that we can see.   Past Medical History:  Diagnosis Date   CAD, UNSPECIFIED SITE    2009. 99% proximal RCA stenosis followed by 80% distal stenosis. The LAD has a 100% stenosis in the midsegment. The circumflex is patent with distal occlusion. She has been managed medically. Her EF was well-preserved.   CAROTID STENOSIS    DIVERTICULAR DISEASE    Essential hypertension, benign    Gallstones 2009   Gastritis    H/O blood clots 1984   DVT and PE   Hx of degenerative disc disease    HYPERLIPIDEMIA    MACULAR DEGENERATION    OSTEOPOROSIS    Pneumonia    Rheumatoid arthritis(714.0)    Thyroid cancer ([place]) 2008    Past Surgical History:  Procedure Laterality Date    ABDOMINAL HYSTERECTOMY  1984   APPENDECTOMY  1959   BREAST CYST EXCISION     left   CATARACT EXTRACTION, BILATERAL     CHOLECYSTECTOMY  2009   COLONOSCOPY     ESOPHAGOGASTRODUODENOSCOPY     IM NAILING FEMORAL SHAFT RETROGRADE  2010   left   TONSILLECTOMY     WRIST SURGERY  2004   Left     Current Outpatient Medications  Medication Sig Dispense Refill   Acetaminophen (TYLENOL PO) Take 500 mg by mouth every 6 (six) hours as needed (pain).     amLODipine (NORVASC) 5 MG tablet TAKE 1 AND 1/2 TABLETS(7.5 MG) BY MOUTH DAILY 45 tablet 5   apixaban (ELIQUIS) 2.5 MG TABS tablet Take 1 tablet (2.5 mg total) by mouth 2 (two) times daily. 180 tablet 1   aspirin 81 MG tablet Take 81 mg by mouth daily.     atorvastatin (LIPITOR) 40 MG tablet Take 40 mg by mouth daily.     Calcium Carbonate-Vitamin D (CALCIUM + D PO) Take 1 tablet by mouth 2 (two) times daily.     CYSTINE PO Take by mouth.     fish oil-omega-3 fatty acids 1000 MG capsule Take 1,200 g by mouth daily.  isosorbide mononitrate (IMDUR) 30 MG 24 hr tablet Take 30 mg by mouth daily.     levothyroxine (SYNTHROID, LEVOTHROID) 88 MCG tablet Take 1 tablet (88 mcg total) by mouth daily before breakfast. 30 tablet 6   losartan (COZAAR) 50 MG tablet Take 1 tablet (50 mg total) by mouth 2 (two) times daily. 180 tablet 1   metoprolol succinate (TOPROL-XL) 100 MG 24 hr tablet Take 100 mg by mouth 2 (two) times daily. Take with or immediately following a meal.     Multiple Vitamins-Minerals (CENTRUM SILVER 50+WOMEN) TABS Take 1 tablet by mouth daily.     Multiple Vitamins-Minerals (OCUVITE PRESERVISION PO) Take by mouth 2 (two) times daily.     nitroGLYCERIN (NITROSTAT) 0.4 MG SL tablet Place 1 tablet (0.4 mg total) under the tongue every 5 (five) minutes as needed. 25 tablet 6   olopatadine (PATANOL) 0.1 % ophthalmic solution 1 drop 2 (two) times daily.     Tetrahydrozoline-Zn Sulfate (EYE DROPS A/C OP) Place 1 drop into both eyes every 6 (six)  hours as needed (for dry eyes).     No current facility-administered medications for this visit.    Allergies:   Hydrocodone and Prednisone     ROS:  Please see the history of present illness.   Otherwise, review of systems are positive for hemorrhoids.   All other systems are reviewed and negative.    PHYSICAL EXAM: VS:  BP (!) 150/78   Pulse 95   Ht 5\' 3"  (1.6 m)   Wt 131 lb 6.4 oz (59.6 kg)   SpO2 95%   BMI 23.28 kg/m  , BMI Body mass index is 23.28 kg/m. GENERAL:  Frail-appearing NECK:  No jugular venous distention, waveform within normal limits, carotid upstroke brisk and symmetric, no bruits, no thyromegaly LUNGS:  Few basilar crackles.  CHEST:  Unremarkable HEART:  PMI not displaced or sustained,S1 and S2 within normal limits, no S3, no S4, no clicks, no rubs, no murmurs, irregular  ABD:  Flat, positive bowel sounds normal in frequency in pitch, no bruits, no rebound, no guarding, no midline pulsatile mass, no hepatomegaly, no splenomegaly EXT:  2 plus pulses throughout, mild right greater than left lower extremity edema, no cyanosis no clubbing, varicose veins NEURO: Resting tremor    EKG:  EKG is  ordered today. The ekg ordered today demonstrates atrial fibrillation, rate 95, axis within normal limits, intervals within normal limits, no acute ST-T wave changes.   Recent Labs: No results found for requested labs within last 8760 hours.    Lipid Panel    Component Value Date/Time   CHOL 157 07/04/2015 0856   TRIG 84 07/04/2015 0856   HDL 79 07/04/2015 0856   CHOLHDL 2.0 07/04/2015 0856   VLDL 17 07/04/2015 0856   LDLCALC 61 07/04/2015 0856      Wt Readings from Last 3 Encounters:  07/10/21 131 lb 6.4 oz (59.6 kg)  05/06/21 133 lb (60.3 kg)  11/29/20 136 lb 6.4 oz (61.9 kg)      Other studies Reviewed: Additional studies/ records that were reviewed today include: None. Review of the above records demonstrates:  NA   ASSESSMENT AND PLAN:    CAD:     The patient has significant coronary artery disease as described above.  She is not having any angina but never had this.  This certainly could be contributing to her new onset shortness of breath that is likely the arrhythmia.  I will continue to manage this medically.  Consider further ischemia evaluation in the future.    ATRIAL FIB: This is new onset.  I am going to check a 3-day monitor.  She will stop her Plavix and start Eliquis.  I will then consider cardioversion.  HTN:    The blood pressure is mildly elevated.  I will manage this in the context of any changes we need to make for her rate control.    HYPERLIPIDEMIA:       LDL was previously 56 and I will defer to [place], MD   SOB:   She did not drop her oxygen saturation walking around the office today but she did increase her heart rate was 120s.  I suspect her fibrillation is the etiology of her shortness of breath but I will be checking a BNP level and an echocardiogram.  She might eventually need chest x-ray.  She likely will need cardioversion.    Current medicines are reviewed at length with the patient today.  The patient does not have concerns regarding medicines.  The following changes have been made:  None  Labs/ tests ordered today include:   Orders Placed This Encounter  Procedures   CBC with Differential/Platelet   TSH   Comprehensive metabolic panel   B Nat Peptide   LONG TERM MONITOR (3-14 DAYS)   EKG 12-Lead   ECHOCARDIOGRAM COMPLETE      Disposition:   FU with me or APP in a couple of weeks.   Signed, Minus Breeding, MD  07/10/2021 11:12 AM    [place]

## 2021-07-10 ENCOUNTER — Encounter: Payer: Self-pay | Admitting: Cardiology

## 2021-07-10 ENCOUNTER — Other Ambulatory Visit: Payer: Self-pay

## 2021-07-10 ENCOUNTER — Ambulatory Visit (INDEPENDENT_AMBULATORY_CARE_PROVIDER_SITE_OTHER): Payer: Medicare Other

## 2021-07-10 ENCOUNTER — Ambulatory Visit: Payer: Medicare Other | Admitting: Cardiology

## 2021-07-10 VITALS — BP 150/78 | HR 95 | Ht 63.0 in | Wt 131.4 lb

## 2021-07-10 DIAGNOSIS — I4891 Unspecified atrial fibrillation: Secondary | ICD-10-CM

## 2021-07-10 DIAGNOSIS — E785 Hyperlipidemia, unspecified: Secondary | ICD-10-CM | POA: Diagnosis not present

## 2021-07-10 DIAGNOSIS — R0602 Shortness of breath: Secondary | ICD-10-CM

## 2021-07-10 DIAGNOSIS — I10 Essential (primary) hypertension: Secondary | ICD-10-CM

## 2021-07-10 DIAGNOSIS — I2510 Atherosclerotic heart disease of native coronary artery without angina pectoris: Secondary | ICD-10-CM

## 2021-07-10 MED ORDER — APIXABAN 2.5 MG PO TABS: 2.5000 mg | ORAL_TABLET | Freq: Two times a day (BID) | ORAL | 1 refills | Status: DC

## 2021-07-10 NOTE — Patient Instructions (Addendum)
Medication Instructions:  STOP CLOPIDOGREL   START ELIQUIS 2.5 MG TWICE A DAY   *If you need a refill on your cardiac medications before your next appointment, please call your pharmacy*  Lab Work: TSH/CBC/CMET/BNP TODAY   If you have labs (blood work) drawn today and your tests are completely normal, you will receive your results only by: [place] (if you have MyChart) OR A paper copy in the mail If you have any lab test that is abnormal or we need to change your treatment, we will call you to review the results.  Testing/Procedures: Your physician has recommended that you wear an event monitor. Event monitors are medical devices that record the heart's electrical activity. Doctors most often [place] these monitors to diagnose arrhythmias. Arrhythmias are problems with the speed or rhythm of the heartbeat. The monitor is a small, portable device. You can wear one while you do your normal daily activities. This is usually used to diagnose what is causing palpitations/syncope (passing out). [facility] [place] [address]   3 DAY ZIO MONITOR, THIS WILL BE MAILED TO YOU   Follow-Up: At [facility], you and your health needs are our priority.  As part of our continuing mission to provide you with exceptional heart care, we have created designated Provider Care Teams.  These Care Teams include your primary Cardiologist (physician) and Advanced Practice Providers (APPs -  Physician Assistants and Nurse Practitioners) who all work together to provide you with the care you need, when you need it.  We recommend signing up for the patient portal called "MyChart".  Sign up information is provided on this After Visit Summary.  MyChart is used to connect with patients for Virtual Visits (Telemedicine).  Patients are able to view lab/test results, encounter notes, upcoming appointments, etc.  Non-urgent messages can be sent to your provider as well.   To learn more about what you can do  with MyChart, go to [email or web address].    Your next appointment:   AFTER MONITOR RESULTS AND ECHO   The format for your next appointment:   In Person  Provider:   You may see Minus Breeding, MD or one of the following Advanced Practice Providers on your designated Care Team:   Rosaria Ferries, PA-C Caron Presume, PA-C Jory Sims, DNP, ANP   Other Instructions  Bryn Gulling- Long Term Monitor Instructions  Your physician has requested you wear a ZIO patch monitor for 3 days.  This is a single patch monitor. [employer] supplies one patch monitor per enrollment. Additional stickers are not available. Please do not apply patch if you will be having a Nuclear Stress Test,  Echocardiogram, Cardiac CT, MRI, or Chest Xray during the period you would be wearing the  monitor. The patch cannot be worn during these tests. You cannot remove and re-apply the  ZIO XT patch monitor.  Your ZIO patch monitor will be mailed 3 day USPS to your address on file. It may take 3-5 days  to receive your monitor after you have been enrolled.  Once you have received your monitor, please review the enclosed instructions. Your monitor  has already been registered assigning a specific monitor serial # to you.  Billing and Patient Assistance Program Information  We have supplied [employer] with any of your insurance information on file for billing purposes. [employer] offers a sliding scale Patient Assistance Program for patients that do not have  insurance, or whose insurance does not completely cover the cost of  the ZIO monitor.  You must apply for the Patient Assistance Program to qualify for this discounted rate.  To apply, please call [employer] at [phone], select option 4, select option 2, ask to apply for  Patient Assistance Program. Theodore Demark will ask your household income, and how many people  are in your household. They will quote your out-of-pocket cost based on that information.  [employer] will also  be able to set up a 4-month, interest-free payment plan if needed.  Applying the monitor   Shave hair from upper left chest.  Hold abrader disc by orange tab. Rub abrader in 40 strokes over the upper left chest as  indicated in your monitor instructions.  Clean area with 4 enclosed alcohol pads. Let dry.  Apply patch as indicated in monitor instructions. Patch will be placed under collarbone on left  side of chest with arrow pointing upward.  Rub patch adhesive wings for 2 minutes. Remove white label marked "1". Remove the white  label marked "2". Rub patch adhesive wings for 2 additional minutes.  While looking in a mirror, press and release button in center of patch. A small green light will  flash 3-4 times. This will be your only indicator that the monitor has been turned on.  Do not shower for the first 24 hours. You may shower after the first 24 hours.  Press the button if you feel a symptom. You will hear a small click. Record Date, Time and  Symptom in the Patient Logbook.  When you are ready to remove the patch, follow instructions on the last 2 pages of Patient  Logbook. Stick patch monitor onto the last page of Patient Logbook.  Place Patient Logbook in the blue and white box. Use locking tab on box and tape box closed  securely. The blue and white box has prepaid postage on it. Please place it in the mailbox as  soon as possible. Your physician should have your test results approximately 7 days after the  monitor has been mailed back to [facility].  Call [facility] at [phone] if you have questions regarding  your ZIO XT patch monitor. Call them immediately if you see an orange light blinking on your  monitor.  If your monitor falls off in less than 4 days, contact our Monitor department at [phone].  If your monitor becomes loose or falls off after 4 days call [employer] at [phone] for  suggestions on securing your monitor

## 2021-07-10 NOTE — Progress Notes (Unsigned)
Patient enrolled for [employer] to mail a 3 day ZIO XT monitor to her address on file.

## 2021-07-11 LAB — COMPREHENSIVE METABOLIC PANEL
ALT: 17 IU/L (ref 0–32)
AST: 30 IU/L (ref 0–40)
Albumin/Globulin Ratio: 1.1 — ABNORMAL LOW (ref 1.2–2.2)
Albumin: 4.4 g/dL (ref 3.6–4.6)
Alkaline Phosphatase: 78 IU/L (ref 44–121)
BUN/Creatinine Ratio: 25 (ref 12–28)
BUN: 17 mg/dL (ref 8–27)
Bilirubin Total: 0.7 mg/dL (ref 0.0–1.2)
CO2: 26 mmol/L (ref 20–29)
Calcium: 9.3 mg/dL (ref 8.7–10.3)
Chloride: 87 mmol/L — ABNORMAL LOW (ref 96–106)
Creatinine, Ser: 0.67 mg/dL (ref 0.57–1.00)
Globulin, Total: 3.9 g/dL (ref 1.5–4.5)
Glucose: 92 mg/dL (ref 70–99)
Potassium: 5.4 mmol/L — ABNORMAL HIGH (ref 3.5–5.2)
Sodium: 126 mmol/L — ABNORMAL LOW (ref 134–144)
Total Protein: 8.3 g/dL (ref 6.0–8.5)
eGFR: 87 mL/min/{1.73_m2} (ref >59)

## 2021-07-11 LAB — CBC WITH DIFFERENTIAL/PLATELET
Basophils Absolute: 0 10*3/uL (ref 0.0–0.2)
Basos: 1 %
EOS (ABSOLUTE): 0.1 10*3/uL (ref 0.0–0.4)
Eos: 1 %
Hematocrit: 38.5 % (ref 34.0–46.6)
Hemoglobin: 12.8 g/dL (ref 11.1–15.9)
Immature Grans (Abs): 0 10*3/uL (ref 0.0–0.1)
Immature Granulocytes: 0 %
Lymphocytes Absolute: 0.6 10*3/uL — ABNORMAL LOW (ref 0.7–3.1)
Lymphs: 11 %
MCH: 31 pg (ref 26.6–33.0)
MCHC: 33.2 g/dL (ref 31.5–35.7)
MCV: 93 fL (ref 79–97)
Monocytes Absolute: 0.6 10*3/uL (ref 0.1–0.9)
Monocytes: 11 %
Neutrophils Absolute: 3.7 10*3/uL (ref 1.4–7.0)
Neutrophils: 76 %
Platelets: 223 10*3/uL (ref 150–450)
RBC: 4.13 x10E6/uL (ref 3.77–5.28)
RDW: 12.8 % (ref 11.7–15.4)
WBC: 5 10*3/uL (ref 3.4–10.8)

## 2021-07-11 LAB — BRAIN NATRIURETIC PEPTIDE: BNP: 265.8 pg/mL — ABNORMAL HIGH (ref 0.0–100.0)

## 2021-07-11 LAB — TSH: TSH: 0.966 u[IU]/mL (ref 0.450–4.500)

## 2021-07-12 DIAGNOSIS — R0602 Shortness of breath: Secondary | ICD-10-CM | POA: Diagnosis not present

## 2021-07-12 DIAGNOSIS — I4891 Unspecified atrial fibrillation: Secondary | ICD-10-CM

## 2021-07-15 ENCOUNTER — Other Ambulatory Visit: Payer: Self-pay

## 2021-07-15 DIAGNOSIS — Z79899 Other long term (current) drug therapy: Secondary | ICD-10-CM

## 2021-07-15 MED ORDER — FUROSEMIDE 20 MG PO TABS: 20.0000 mg | ORAL_TABLET | Freq: Every day | ORAL | 0 refills | Status: DC

## 2021-07-18 ENCOUNTER — Telehealth: Payer: Self-pay | Admitting: Cardiology

## 2021-07-18 DIAGNOSIS — E875 Hyperkalemia: Secondary | ICD-10-CM

## 2021-07-18 DIAGNOSIS — Z79899 Other long term (current) drug therapy: Secondary | ICD-10-CM | POA: Diagnosis not present

## 2021-07-18 LAB — BASIC METABOLIC PANEL
BUN/Creatinine Ratio: 24 (ref 12–28)
BUN: 20 mg/dL (ref 8–27)
CO2: 29 mmol/L (ref 20–29)
Calcium: 9.5 mg/dL (ref 8.7–10.3)
Chloride: 88 mmol/L — ABNORMAL LOW (ref 96–106)
Creatinine, Ser: 0.85 mg/dL (ref 0.57–1.00)
Glucose: 139 mg/dL — ABNORMAL HIGH (ref 70–99)
Potassium: 5.8 mmol/L (ref 3.5–5.2)
Sodium: 130 mmol/L — ABNORMAL LOW (ref 134–144)
eGFR: 68 mL/min/{1.73_m2} (ref >59)

## 2021-07-18 NOTE — Telephone Encounter (Signed)
Per Dr. Debara Pickett, would recommend decreasing dietary potassium - repeat BMET Monday, possibly that the lab may have been hemolyzed. Called patient. Patient will come in on Monday for repeat BMET.

## 2021-07-18 NOTE — Telephone Encounter (Signed)
[name] with [employer] called with a critical potassium of 5.8. Will send to the DOD, Dr. Debara Pickett.

## 2021-07-18 NOTE — Telephone Encounter (Signed)
Darrick Penna is calling to report a critical lab.

## 2021-07-21 ENCOUNTER — Telehealth: Payer: Self-pay | Admitting: *Deleted

## 2021-07-21 DIAGNOSIS — E875 Hyperkalemia: Secondary | ICD-10-CM | POA: Diagnosis not present

## 2021-07-21 LAB — BASIC METABOLIC PANEL
BUN/Creatinine Ratio: 23 (ref 12–28)
BUN: 18 mg/dL (ref 8–27)
CO2: 27 mmol/L (ref 20–29)
Calcium: 9.6 mg/dL (ref 8.7–10.3)
Chloride: 90 mmol/L — ABNORMAL LOW (ref 96–106)
Creatinine, Ser: 0.78 mg/dL (ref 0.57–1.00)
Glucose: 117 mg/dL — ABNORMAL HIGH (ref 70–99)
Potassium: 5.8 mmol/L (ref 3.5–5.2)
Sodium: 128 mmol/L — ABNORMAL LOW (ref 134–144)
eGFR: 76 mL/min/{1.73_m2} (ref >59)

## 2021-07-21 NOTE — Telephone Encounter (Signed)
Spoke with pt, potassium 5.8 on labs from today. She is not taking a potassium supplement and has not been eating potassium rich foods per her knowledge. She is not having any problems. Will make dr hochrein aware.

## 2021-07-22 ENCOUNTER — Other Ambulatory Visit: Payer: Self-pay

## 2021-07-22 ENCOUNTER — Ambulatory Visit (HOSPITAL_COMMUNITY): Payer: Medicare Other | Attending: Cardiovascular Disease

## 2021-07-22 DIAGNOSIS — I4891 Unspecified atrial fibrillation: Secondary | ICD-10-CM | POA: Diagnosis not present

## 2021-07-22 DIAGNOSIS — I10 Essential (primary) hypertension: Secondary | ICD-10-CM | POA: Diagnosis not present

## 2021-07-22 DIAGNOSIS — R0602 Shortness of breath: Secondary | ICD-10-CM | POA: Insufficient documentation

## 2021-07-22 LAB — ECHOCARDIOGRAM COMPLETE
AR max vel: 2.21 cm2
AV Area VTI: 2.3 cm2
AV Area mean vel: 2.05 cm2
AV Mean grad: 7.7 mmHg
AV Peak grad: 13.3 mmHg
Ao pk vel: 1.82 m/s
S' Lateral: 2.8 cm

## 2021-07-22 MED ORDER — LOSARTAN POTASSIUM 50 MG PO TABS: 50.0000 mg | ORAL_TABLET | Freq: Every day | ORAL | 1 refills | Status: DC

## 2021-07-22 MED ORDER — LOKELMA 10 G PO PACK: PACK | ORAL | 0 refills | Status: DC

## 2021-07-22 NOTE — Telephone Encounter (Signed)
Spoke with pt, aware of the recommendations. New script sent to the pharmacy and Lab orders placed.

## 2021-07-23 ENCOUNTER — Telehealth: Payer: Self-pay | Admitting: Cardiology

## 2021-07-23 NOTE — Telephone Encounter (Signed)
Spoke with [facility], aware she is having her lab work checked tomorrow and may not need the medication. They are going to follow up with [place] on Friday.

## 2021-07-23 NOTE — Telephone Encounter (Signed)
Returned pt call. She was told last Friday to watch her potassium rich food intake. States that she just researched this and didn't realize how many foods have potassium in them and states she ate high potassium foods over the weekend without knowing. She is now avoiding foods w/ potassium in them and has changed diet to try and get the K+ back down.  States that insurance is requiring a PA for [employer], so she has not had any yet and pharmacy hasn't filled.  She would prefer not to have to take them if necessary.  She is asking if she can have rechecked being that she was in-taking K+ rich foods over weekend unbeknownst to her. (Example -- she was drinking OJ and didn't know has K+ in it)  Pt aware most likely will still need Lokelma packets, but will let Dr. Rosezella Florida RN follow up with her on advisement of this matter. Pt agreeable to plan.

## 2021-07-23 NOTE — Telephone Encounter (Signed)
Pt is calling back with further questions regarding the potassium meds. Please advise pt further

## 2021-07-23 NOTE — Telephone Encounter (Signed)
They are call to check on status on prior auth they faxed over in regards to the sodium zirconium cyclosilicate (LOKELMA) 10 g PACK packet

## 2021-07-23 NOTE — Telephone Encounter (Signed)
Spoke with pt, okay given for her to have lab work drawn tomorrow to see where her potassium level is now.

## 2021-07-24 ENCOUNTER — Other Ambulatory Visit: Payer: Self-pay | Admitting: *Deleted

## 2021-07-24 ENCOUNTER — Other Ambulatory Visit: Payer: Self-pay

## 2021-07-24 DIAGNOSIS — I10 Essential (primary) hypertension: Secondary | ICD-10-CM

## 2021-07-24 DIAGNOSIS — I471 Supraventricular tachycardia: Secondary | ICD-10-CM

## 2021-07-24 LAB — BASIC METABOLIC PANEL
BUN/Creatinine Ratio: 26 (ref 12–28)
BUN: 18 mg/dL (ref 8–27)
CO2: 24 mmol/L (ref 20–29)
Calcium: 8.8 mg/dL (ref 8.7–10.3)
Chloride: 93 mmol/L — ABNORMAL LOW (ref 96–106)
Creatinine, Ser: 0.7 mg/dL (ref 0.57–1.00)
Glucose: 128 mg/dL — ABNORMAL HIGH (ref 70–99)
Potassium: 4.9 mmol/L (ref 3.5–5.2)
Sodium: 130 mmol/L — ABNORMAL LOW (ref 134–144)
eGFR: 86 mL/min/{1.73_m2} (ref >59)

## 2021-07-25 ENCOUNTER — Telehealth: Payer: Self-pay | Admitting: Cardiology

## 2021-07-25 NOTE — Telephone Encounter (Signed)
Pt called and K is now normal and is taking the Losartan 50 mg as instructed and MVI was stopped Pt has 2 B/P readings 126/72 and 141/86 B/P a little higher second day Encouraged pt to continue to monitor and call back next week with readings Also pt is going to watch dietary intake of K rich foods Pt was also asking about echo and monitor results Will forward to Dr Percival Spanish for review .Adonis Housekeeper

## 2021-07-25 NOTE — Telephone Encounter (Signed)
Patient was hoping to get some advice about how to take her medications. She saw her lab results in her MyChart and is not sure if that would affect how she takes her medication. Please advise

## 2021-07-25 NOTE — Telephone Encounter (Signed)
Repeat labs shows normal potassium. No need for lokelma.

## 2021-07-28 ENCOUNTER — Ambulatory Visit (INDEPENDENT_AMBULATORY_CARE_PROVIDER_SITE_OTHER): Payer: Medicare Other | Admitting: Dermatology

## 2021-07-28 ENCOUNTER — Other Ambulatory Visit: Payer: Self-pay

## 2021-07-28 ENCOUNTER — Encounter: Payer: Self-pay | Admitting: Dermatology

## 2021-07-28 DIAGNOSIS — Z5321 Procedure and treatment not carried out due to patient leaving prior to being seen by health care provider: Secondary | ICD-10-CM

## 2021-08-04 ENCOUNTER — Ambulatory Visit: Payer: Medicare Other | Admitting: Dermatology

## 2021-08-04 ENCOUNTER — Other Ambulatory Visit: Payer: Self-pay

## 2021-08-04 ENCOUNTER — Encounter: Payer: Self-pay | Admitting: Dermatology

## 2021-08-04 DIAGNOSIS — L820 Inflamed seborrheic keratosis: Secondary | ICD-10-CM

## 2021-08-04 DIAGNOSIS — L570 Actinic keratosis: Secondary | ICD-10-CM | POA: Diagnosis not present

## 2021-08-04 DIAGNOSIS — Z872 Personal history of diseases of the skin and subcutaneous tissue: Secondary | ICD-10-CM | POA: Diagnosis not present

## 2021-08-04 DIAGNOSIS — D0439 Carcinoma in situ of skin of other parts of face: Secondary | ICD-10-CM

## 2021-08-04 DIAGNOSIS — D485 Neoplasm of uncertain behavior of skin: Secondary | ICD-10-CM

## 2021-08-04 DIAGNOSIS — Z85828 Personal history of other malignant neoplasm of skin: Secondary | ICD-10-CM

## 2021-08-04 DIAGNOSIS — Z86018 Personal history of other benign neoplasm: Secondary | ICD-10-CM

## 2021-08-04 NOTE — Patient Instructions (Signed)

## 2021-08-05 DIAGNOSIS — I4819 Other persistent atrial fibrillation: Secondary | ICD-10-CM | POA: Insufficient documentation

## 2021-08-05 DIAGNOSIS — I480 Paroxysmal atrial fibrillation: Secondary | ICD-10-CM | POA: Insufficient documentation

## 2021-08-05 NOTE — Progress Notes (Signed)
Cardiology Office Note   Date:  08/06/2021   ID:  [name], DOB August 22, 1936, MRN [id number]  PCP:  Reynold Bowen, MD  Cardiologist:   Minus Breeding, MD   Chief Complaint  Patient presents with   Atrial Fibrillation       History of Present Illness: [name] is a 85 y.o. female who presents for follow up of CAD.  She was admitted to the hospital in December 2019 with bronchitis.  She had an elevated troponin which was thought to be demand ischemia.    She has been having increased SOB and she hs had increased stress and anxiety.  She was in new onset atrial fib.  Monitor demonstrated that this was persistent.   She has had a little less SOB with decreased ACE  inhibitor which I did because her blood pressure was running low.  However, she still very fatigued.  Her husband says she walks only several feet before getting tired.  She has had hyperkalemia that we have treated.  She has been losing weight because she is trying to reduce her potassium in her foods.  She is not particularly noticing the palpitations and is not describing presyncope or syncope.  She may be having some restless sleep as her husband notes her somewhat talking in her sleep.  She is not having any new PND or orthopnea.  She is not having any chest pressure, neck or arm discomfort.   Past Medical History:  Diagnosis Date   CAD, UNSPECIFIED SITE    2009. 99% proximal RCA stenosis followed by 80% distal stenosis. The LAD has a 100% stenosis in the midsegment. The circumflex is patent with distal occlusion. She has been managed medically. Her EF was well-preserved.   CAROTID STENOSIS    DIVERTICULAR DISEASE    Essential hypertension, benign    Gallstones 2009   Gastritis    H/O blood clots 1984   DVT and PE   Hx of degenerative disc disease    HYPERLIPIDEMIA    MACULAR DEGENERATION    OSTEOPOROSIS    Pneumonia    Rheumatoid arthritis(714.0)    Thyroid cancer ([place]) 2008    Past Surgical  History:  Procedure Laterality Date   ABDOMINAL HYSTERECTOMY  1984   APPENDECTOMY  1959   BREAST CYST EXCISION     left   CATARACT EXTRACTION, BILATERAL     CHOLECYSTECTOMY  2009   COLONOSCOPY     ESOPHAGOGASTRODUODENOSCOPY     IM NAILING FEMORAL SHAFT RETROGRADE  2010   left   TONSILLECTOMY     WRIST SURGERY  2004   Left     Current Outpatient Medications  Medication Sig Dispense Refill   Acetaminophen (TYLENOL PO) Take 500 mg by mouth every 6 (six) hours as needed (pain).     amLODipine (NORVASC) 5 MG tablet TAKE 1 AND 1/2 TABLETS(7.5 MG) BY MOUTH DAILY 45 tablet 5   apixaban (ELIQUIS) 2.5 MG TABS tablet Take 1 tablet (2.5 mg total) by mouth 2 (two) times daily. 180 tablet 1   aspirin 81 MG tablet Take 81 mg by mouth daily.     atorvastatin (LIPITOR) 40 MG tablet Take 40 mg by mouth daily.     Calcium Carbonate-Vitamin D (CALCIUM + D PO) Take 1 tablet by mouth 2 (two) times daily.     fish oil-omega-3 fatty acids 1000 MG capsule Take 1,200 g by mouth daily.     isosorbide mononitrate (IMDUR) 30 MG 24  hr tablet Take 30 mg by mouth daily.     levothyroxine (SYNTHROID, LEVOTHROID) 88 MCG tablet Take 1 tablet (88 mcg total) by mouth daily before breakfast. 30 tablet 6   losartan (COZAAR) 50 MG tablet Take 1 tablet (50 mg total) by mouth daily. 180 tablet 1   metoprolol succinate (TOPROL-XL) 100 MG 24 hr tablet Take 100 mg by mouth 2 (two) times daily. Take with or immediately following a meal.     Multiple Vitamins-Minerals (CENTRUM SILVER 50+WOMEN) TABS Take 1 tablet by mouth daily.     Multiple Vitamins-Minerals (OCUVITE PRESERVISION PO) Take by mouth 2 (two) times daily.     nitroGLYCERIN (NITROSTAT) 0.4 MG SL tablet Place 1 tablet (0.4 mg total) under the tongue every 5 (five) minutes as needed. 25 tablet 6   olopatadine (PATANOL) 0.1 % ophthalmic solution 1 drop 2 (two) times daily.     Tetrahydrozoline-Zn Sulfate (EYE DROPS A/C OP) Place 1 drop into both eyes every 6 (six)  hours as needed (for dry eyes).     CYSTINE PO Take by mouth. (Patient not taking: Reported on 08/06/2021)     furosemide (LASIX) 20 MG tablet Take 1 tablet (20 mg total) by mouth daily. (Patient not taking: Reported on 08/06/2021) 3 tablet 0   sodium zirconium cyclosilicate (LOKELMA) 10 g PACK packet Take 2 doses-10 hours apart (Patient not taking: Reported on 08/06/2021) 2 packet 0   No current facility-administered medications for this visit.    Allergies:   Hydrocodone and Prednisone     ROS:  Please see the history of present illness.   Otherwise, review of systems are positive for none.   All other systems are reviewed and negative.    PHYSICAL EXAM: VS:  BP 122/68   Pulse 83   Ht 5\' 3"  (1.6 m)   Wt 127 lb (57.6 kg)   SpO2 96%   BMI 22.50 kg/m  , BMI Body mass index is 22.5 kg/m. GENERAL:  Well appearing NECK:  No jugular venous distention, waveform within normal limits, carotid upstroke brisk and symmetric, no bruits, no thyromegaly LUNGS:  Clear to auscultation bilaterally CHEST:  Unremarkable HEART:  PMI not displaced or sustained,S1 and S2 within normal limits, no S3, no clicks, no rubs, soft systolic murmur , irregular ABD:  Flat, positive bowel sounds normal in frequency in pitch, no bruits, no rebound, no guarding, no midline pulsatile mass, no hepatomegaly, no splenomegaly EXT:  2 plus pulses throughout, no edema, no cyanosis no clubbing   EKG:  EKG is  ordered today. The ekg ordered today demonstrates atrial fibrillation, rate 76, axis within normal limits, intervals within normal limits, no acute ST-T wave changes.   Recent Labs: 07/10/2021: ALT 17; BNP 265.8; Hemoglobin 12.8; Platelets 223; TSH 0.966 07/24/2021: BUN 18; Creatinine, Ser 0.70; Potassium 4.9; Sodium 130    Lipid Panel    Component Value Date/Time   CHOL 157 07/04/2015 0856   TRIG 84 07/04/2015 0856   HDL 79 07/04/2015 0856   CHOLHDL 2.0 07/04/2015 0856   VLDL 17 07/04/2015 0856   LDLCALC  61 07/04/2015 0856      Wt Readings from Last 3 Encounters:  08/06/21 127 lb (57.6 kg)  07/10/21 131 lb 6.4 oz (59.6 kg)  05/06/21 133 lb (60.3 kg)      Other studies Reviewed: Additional studies/ records that were reviewed today include: Monitor results. Review of the above records demonstrates: See elsewhere   ASSESSMENT AND PLAN:    CAD:  The patient has no new sypmtoms.  No further cardiovascular testing is indicated.  We will continue with aggressive risk reduction and meds as listed.  ATRIAL FIB: This has been persistent on her monitor.  She is tolerating her anticoagulation. Ms. [name] has a CHA2DS2 - VASc score of 5.   I do not know that this is contributing to her fatigue but we are going to try cardioversion.  Further attempts at rhythm versus rate control will be based on her response to this.  HTN: Her blood pressure is at acceptable limits with the reduced Cozaar.  No change in therapy.  ELEVATED BS: She has some elevated blood sugars and I will be checking his A1c.    SOB:   She did not desat when she walked around the office the other day.  A BNP was minimally elevated though an echo was essentially unremarkable.  No feel like she has overt diastolic heart failure.  She did have rapid heart rate with ambulation so her breathlessness could be associated with the atrial fibrillation.   HYPERKALEMIA: I will repeat a event today  Current medicines are reviewed at length with the patient today.  The patient does not have concerns regarding medicines.  The following changes have been made:  None  Labs/ tests ordered today include:   Orders Placed This Encounter  Procedures   Basic metabolic panel   Hemoglobin A1c       Disposition:   FU with APP in two weeks after the cardioversion.   Signed, Minus Breeding, MD  08/06/2021 8:51 AM    [place]

## 2021-08-05 NOTE — H&P (View-Only) (Signed)
Cardiology Office Note   Date:  08/06/2021   ID:  [name], DOB [birth year], MRN [id number]  PCP:  Reynold Bowen, MD  Cardiologist:   Minus Breeding, MD   Chief Complaint  Patient presents with   Atrial Fibrillation       History of Present Illness: [name] is a 85 y.o. female who presents for follow up of CAD.  She was admitted to the hospital in December 2019 with bronchitis.  She had an elevated troponin which was thought to be demand ischemia.    She has been having increased SOB and she hs had increased stress and anxiety.  She was in new onset atrial fib.  Monitor demonstrated that this was persistent.   She has had a little less SOB with decreased ACE  inhibitor which I did because her blood pressure was running low.  However, she still very fatigued.  Her husband says she walks only several feet before getting tired.  She has had hyperkalemia that we have treated.  She has been losing weight because she is trying to reduce her potassium in her foods.  She is not particularly noticing the palpitations and is not describing presyncope or syncope.  She may be having some restless sleep as her husband notes her somewhat talking in her sleep.  She is not having any new PND or orthopnea.  She is not having any chest pressure, neck or arm discomfort.   Past Medical History:  Diagnosis Date   CAD, UNSPECIFIED SITE    2009. 99% proximal RCA stenosis followed by 80% distal stenosis. The LAD has a 100% stenosis in the midsegment. The circumflex is patent with distal occlusion. She has been managed medically. Her EF was well-preserved.   CAROTID STENOSIS    DIVERTICULAR DISEASE    Essential hypertension, benign    Gallstones 2009   Gastritis    H/O blood clots 1984   DVT and PE   Hx of degenerative disc disease    HYPERLIPIDEMIA    MACULAR DEGENERATION    OSTEOPOROSIS    Pneumonia    Rheumatoid arthritis(714.0)    Thyroid cancer ([place]) 2008    Past Surgical  History:  Procedure Laterality Date   ABDOMINAL HYSTERECTOMY  1984   APPENDECTOMY  1959   BREAST CYST EXCISION     left   CATARACT EXTRACTION, BILATERAL     CHOLECYSTECTOMY  2009   COLONOSCOPY     ESOPHAGOGASTRODUODENOSCOPY     IM NAILING FEMORAL SHAFT RETROGRADE  2010   left   TONSILLECTOMY     WRIST SURGERY  2004   Left     Current Outpatient Medications  Medication Sig Dispense Refill   Acetaminophen (TYLENOL PO) Take 500 mg by mouth every 6 (six) hours as needed (pain).     amLODipine (NORVASC) 5 MG tablet TAKE 1 AND 1/2 TABLETS(7.5 MG) BY MOUTH DAILY 45 tablet 5   apixaban (ELIQUIS) 2.5 MG TABS tablet Take 1 tablet (2.5 mg total) by mouth 2 (two) times daily. 180 tablet 1   aspirin 81 MG tablet Take 81 mg by mouth daily.     atorvastatin (LIPITOR) 40 MG tablet Take 40 mg by mouth daily.     Calcium Carbonate-Vitamin D (CALCIUM + D PO) Take 1 tablet by mouth 2 (two) times daily.     fish oil-omega-3 fatty acids 1000 MG capsule Take 1,200 g by mouth daily.     isosorbide mononitrate (IMDUR) 30 MG 24  hr tablet Take 30 mg by mouth daily.     levothyroxine (SYNTHROID, LEVOTHROID) 88 MCG tablet Take 1 tablet (88 mcg total) by mouth daily before breakfast. 30 tablet 6   losartan (COZAAR) 50 MG tablet Take 1 tablet (50 mg total) by mouth daily. 180 tablet 1   metoprolol succinate (TOPROL-XL) 100 MG 24 hr tablet Take 100 mg by mouth 2 (two) times daily. Take with or immediately following a meal.     Multiple Vitamins-Minerals (CENTRUM SILVER 50+WOMEN) TABS Take 1 tablet by mouth daily.     Multiple Vitamins-Minerals (OCUVITE PRESERVISION PO) Take by mouth 2 (two) times daily.     nitroGLYCERIN (NITROSTAT) 0.4 MG SL tablet Place 1 tablet (0.4 mg total) under the tongue every 5 (five) minutes as needed. 25 tablet 6   olopatadine (PATANOL) 0.1 % ophthalmic solution 1 drop 2 (two) times daily.     Tetrahydrozoline-Zn Sulfate (EYE DROPS A/C OP) Place 1 drop into both eyes every 6 (six)  hours as needed (for dry eyes).     CYSTINE PO Take by mouth. (Patient not taking: Reported on 08/06/2021)     furosemide (LASIX) 20 MG tablet Take 1 tablet (20 mg total) by mouth daily. (Patient not taking: Reported on 08/06/2021) 3 tablet 0   sodium zirconium cyclosilicate (LOKELMA) 10 g PACK packet Take 2 doses-10 hours apart (Patient not taking: Reported on 08/06/2021) 2 packet 0   No current facility-administered medications for this visit.    Allergies:   Hydrocodone and Prednisone     ROS:  Please see the history of present illness.   Otherwise, review of systems are positive for none.   All other systems are reviewed and negative.    PHYSICAL EXAM: VS:  BP 122/68   Pulse 83   Ht 5\' 3"  (1.6 m)   Wt 127 lb (57.6 kg)   SpO2 96%   BMI 22.50 kg/m  , BMI Body mass index is 22.5 kg/m. GENERAL:  Well appearing NECK:  No jugular venous distention, waveform within normal limits, carotid upstroke brisk and symmetric, no bruits, no thyromegaly LUNGS:  Clear to auscultation bilaterally CHEST:  Unremarkable HEART:  PMI not displaced or sustained,S1 and S2 within normal limits, no S3, no clicks, no rubs, soft systolic murmur , irregular ABD:  Flat, positive bowel sounds normal in frequency in pitch, no bruits, no rebound, no guarding, no midline pulsatile mass, no hepatomegaly, no splenomegaly EXT:  2 plus pulses throughout, no edema, no cyanosis no clubbing   EKG:  EKG is  ordered today. The ekg ordered today demonstrates atrial fibrillation, rate 76, axis within normal limits, intervals within normal limits, no acute ST-T wave changes.   Recent Labs: 07/10/2021: ALT 17; BNP 265.8; Hemoglobin 12.8; Platelets 223; TSH 0.966 07/24/2021: BUN 18; Creatinine, Ser 0.70; Potassium 4.9; Sodium 130    Lipid Panel    Component Value Date/Time   CHOL 157 07/04/2015 0856   TRIG 84 07/04/2015 0856   HDL 79 07/04/2015 0856   CHOLHDL 2.0 07/04/2015 0856   VLDL 17 07/04/2015 0856   LDLCALC  61 07/04/2015 0856      Wt Readings from Last 3 Encounters:  08/06/21 127 lb (57.6 kg)  07/10/21 131 lb 6.4 oz (59.6 kg)  05/06/21 133 lb (60.3 kg)      Other studies Reviewed: Additional studies/ records that were reviewed today include: Monitor results. Review of the above records demonstrates: See elsewhere   ASSESSMENT AND PLAN:    CAD:  The patient has no new sypmtoms.  No further cardiovascular testing is indicated.  We will continue with aggressive risk reduction and meds as listed.  ATRIAL FIB: This has been persistent on her monitor.  She is tolerating her anticoagulation. [name] has a CHA2DS2 - VASc score of 5.   I do not know that this is contributing to her fatigue but we are going to try cardioversion.  Further attempts at rhythm versus rate control will be based on her response to this.  HTN: Her blood pressure is at acceptable limits with the reduced Cozaar.  No change in therapy.  ELEVATED BS: She has some elevated blood sugars and I will be checking his A1c.    SOB:   She did not desat when she walked around the office the other day.  A BNP was minimally elevated though an echo was essentially unremarkable.  No feel like she has overt diastolic heart failure.  She did have rapid heart rate with ambulation so her breathlessness could be associated with the atrial fibrillation.   HYPERKALEMIA: I will repeat a event today  Current medicines are reviewed at length with the patient today.  The patient does not have concerns regarding medicines.  The following changes have been made:  None  Labs/ tests ordered today include:   Orders Placed This Encounter  Procedures   Basic metabolic panel   Hemoglobin A1c       Disposition:   FU with APP in two weeks after the cardioversion.   Signed, Minus Breeding, MD  08/06/2021 8:51 AM    [place]

## 2021-08-06 ENCOUNTER — Ambulatory Visit: Payer: Medicare Other | Admitting: Cardiology

## 2021-08-06 ENCOUNTER — Other Ambulatory Visit: Payer: Self-pay

## 2021-08-06 ENCOUNTER — Encounter: Payer: Self-pay | Admitting: Cardiology

## 2021-08-06 VITALS — BP 122/68 | HR 83 | Ht 63.0 in | Wt 127.0 lb

## 2021-08-06 DIAGNOSIS — I2510 Atherosclerotic heart disease of native coronary artery without angina pectoris: Secondary | ICD-10-CM

## 2021-08-06 DIAGNOSIS — I4819 Other persistent atrial fibrillation: Secondary | ICD-10-CM | POA: Diagnosis not present

## 2021-08-06 DIAGNOSIS — I10 Essential (primary) hypertension: Secondary | ICD-10-CM

## 2021-08-06 DIAGNOSIS — E785 Hyperlipidemia, unspecified: Secondary | ICD-10-CM | POA: Diagnosis not present

## 2021-08-06 NOTE — Patient Instructions (Signed)
Medication Instructions:  The current medical regimen is effective;  continue present plan and medications.  *If you need a refill on your cardiac medications before your next appointment, please call your pharmacy*   Lab Work: BMET, A1C today   If you have labs (blood work) drawn today and your tests are completely normal, you will receive your results only by: [place] (if you have MyChart) OR A paper copy in the mail If you have any lab test that is abnormal or we need to change your treatment, we will call you to review the results.   Testing/Procedures: Your physician has requested that you have a Cardioversion. Electrical Cardioversion uses a jolt of electricity to your heart either through paddles or wired patches attached to your chest. This is a controlled, usually prescheduled, procedure. This procedure is done at the hospital and you are not awake during the procedure. You usually go home the day of the procedure. Please see the instruction sheet given to you today for more information.   Follow-Up: At [facility], you and your health needs are our priority.  As part of our continuing mission to provide you with exceptional heart care, we have created designated Provider Care Teams.  These Care Teams include your primary Cardiologist (physician) and Advanced Practice Providers (APPs -  Physician Assistants and Nurse Practitioners) who all work together to provide you with the care you need, when you need it.  We recommend signing up for the patient portal called "MyChart".  Sign up information is provided on this After Visit Summary.  MyChart is used to connect with patients for Virtual Visits (Telemedicine).  Patients are able to view lab/test results, encounter notes, upcoming appointments, etc.  Non-urgent messages can be sent to your provider as well.   To learn more about what you can do with MyChart, go to [email or web address].    Your next appointment:   1-2  week(s) post  The format for your next appointment:   In Person  Provider:   You will see one of the following Advanced Practice Providers on your designated Care Team:   Rosaria Ferries, PA-C Caron Presume, PA-C Jory Sims, DNP, ANP  Other Instructions  You are scheduled for a TEE/Cardioversion/TEE Cardioversion on Wednesday 11/09 with Dr. Oval Linsey.  Please arrive at the [facility] (Main Entrance A) at [facility]: [address] [place], [place] [address] at 12:00 PM. (1 hour prior to procedure unless lab work is needed; if lab work is needed arrive 1.5 hours ahead)  DIET: Nothing to eat or drink after midnight except a sip of water with medications (see medication instructions below)  FYI: For your safety, and to allow [place] to monitor your vital signs accurately during the surgery/procedure we request that   if you have artificial nails, gel coating, SNS etc. Please have those removed prior to your surgery/procedure. Not having the nail coverings /polish removed may result in cancellation or delay of your surgery/procedure.   Medication Instructions:   Continue your anticoagulant: ELIQUIS You will need to continue your anticoagulant after your procedure until you  are told by your  Provider that it is safe to stop   Labs:   You must have a responsible person to drive you home and stay in the waiting area during your procedure. Failure to do so could result in cancellation.  Bring your insurance cards.  *Special Note: Every effort is made to have your procedure done on time. Occasionally there are emergencies  that occur at the hospital that may cause delays. Please be patient if a delay does occur.

## 2021-08-07 LAB — BASIC METABOLIC PANEL
BUN/Creatinine Ratio: 19 (ref 12–28)
BUN: 15 mg/dL (ref 8–27)
CO2: 29 mmol/L (ref 20–29)
Calcium: 9.2 mg/dL (ref 8.7–10.3)
Chloride: 92 mmol/L — ABNORMAL LOW (ref 96–106)
Creatinine, Ser: 0.77 mg/dL (ref 0.57–1.00)
Glucose: 123 mg/dL — ABNORMAL HIGH (ref 70–99)
Potassium: 4.7 mmol/L (ref 3.5–5.2)
Sodium: 135 mmol/L (ref 134–144)
eGFR: 77 mL/min/{1.73_m2} (ref >59)

## 2021-08-07 LAB — HEMOGLOBIN A1C
Est. average glucose Bld gHb Est-mCnc: 134 mg/dL
Hgb A1c MFr Bld: 6.3 % — ABNORMAL HIGH (ref 4.8–5.6)

## 2021-08-13 ENCOUNTER — Ambulatory Visit (HOSPITAL_COMMUNITY): Payer: Medicare Other | Admitting: Certified Registered Nurse Anesthetist

## 2021-08-13 ENCOUNTER — Ambulatory Visit (HOSPITAL_COMMUNITY)
Admission: RE | Admit: 2021-08-13 | Discharge: 2021-08-13 | Disposition: A | Discharge status: Home / Self Care | Payer: Medicare Other | Attending: Cardiovascular Disease | Admitting: Cardiovascular Disease

## 2021-08-13 ENCOUNTER — Other Ambulatory Visit: Payer: Self-pay

## 2021-08-13 ENCOUNTER — Encounter (HOSPITAL_COMMUNITY)
Admission: RE | Disposition: A | Discharge status: Home / Self Care | Payer: Self-pay | Source: Home / Self Care | Attending: Cardiovascular Disease

## 2021-08-13 ENCOUNTER — Encounter (HOSPITAL_COMMUNITY): Payer: Self-pay | Admitting: Cardiovascular Disease

## 2021-08-13 DIAGNOSIS — Z79899 Other long term (current) drug therapy: Secondary | ICD-10-CM | POA: Diagnosis not present

## 2021-08-13 DIAGNOSIS — E875 Hyperkalemia: Secondary | ICD-10-CM | POA: Diagnosis not present

## 2021-08-13 DIAGNOSIS — I2510 Atherosclerotic heart disease of native coronary artery without angina pectoris: Secondary | ICD-10-CM | POA: Insufficient documentation

## 2021-08-13 DIAGNOSIS — Z7901 Long term (current) use of anticoagulants: Secondary | ICD-10-CM | POA: Diagnosis not present

## 2021-08-13 DIAGNOSIS — I491 Atrial premature depolarization: Secondary | ICD-10-CM | POA: Diagnosis not present

## 2021-08-13 DIAGNOSIS — I4891 Unspecified atrial fibrillation: Secondary | ICD-10-CM | POA: Diagnosis present

## 2021-08-13 DIAGNOSIS — E871 Hypo-osmolality and hyponatremia: Secondary | ICD-10-CM | POA: Diagnosis not present

## 2021-08-13 DIAGNOSIS — I4819 Other persistent atrial fibrillation: Secondary | ICD-10-CM | POA: Diagnosis not present

## 2021-08-13 DIAGNOSIS — I10 Essential (primary) hypertension: Secondary | ICD-10-CM | POA: Insufficient documentation

## 2021-08-13 DIAGNOSIS — E785 Hyperlipidemia, unspecified: Secondary | ICD-10-CM | POA: Diagnosis not present

## 2021-08-13 DIAGNOSIS — E039 Hypothyroidism, unspecified: Secondary | ICD-10-CM | POA: Diagnosis not present

## 2021-08-13 HISTORY — PX: CARDIOVERSION: SHX1299

## 2021-08-13 SURGERY — CARDIOVERSION
Anesthesia: General

## 2021-08-13 MED ORDER — PROPOFOL 10 MG/ML IV BOLUS
INTRAVENOUS | Status: DC | PRN
  Administered 2021-08-13: 50 mg via INTRAVENOUS

## 2021-08-13 MED ORDER — OMEGA-3 FATTY ACIDS 1000 MG PO CAPS: 1.0000 g | ORAL_CAPSULE | Freq: Every day | ORAL | Status: DC

## 2021-08-13 MED ORDER — LIDOCAINE 2% (20 MG/ML) 5 ML SYRINGE
INTRAMUSCULAR | Status: DC | PRN
  Administered 2021-08-13: 60 mg via INTRAVENOUS

## 2021-08-13 MED ORDER — SODIUM CHLORIDE 0.9 % IV SOLN: INTRAVENOUS | Status: DC

## 2021-08-13 MED ORDER — SODIUM CHLORIDE 0.9 % IV SOLN: INTRAVENOUS | Status: DC | PRN

## 2021-08-13 NOTE — Anesthesia Postprocedure Evaluation (Signed)
Anesthesia Post Note  Patient: [name]  Procedure(s) Performed: CARDIOVERSION     Patient location during evaluation: Endoscopy Anesthesia Type: General Level of consciousness: awake and alert Pain management: pain level controlled Vital Signs Assessment: post-procedure vital signs reviewed and stable Respiratory status: spontaneous breathing, nonlabored ventilation and respiratory function stable Cardiovascular status: blood pressure returned to baseline and stable Postop Assessment: no apparent nausea or vomiting Anesthetic complications: no   No notable events documented.  Last Vitals:  Vitals:   08/13/21 1320 08/13/21 1330  BP: (!) 124/59 140/66  Pulse: 77 71  Resp: (!) 29 (!) 23  Temp:    SpO2: 93% 93%    Last Pain:  Vitals:   08/13/21 1330  TempSrc:   PainSc: 0-No pain                 Irby Fails,W. EDMOND

## 2021-08-13 NOTE — Discharge Instructions (Signed)

## 2021-08-13 NOTE — CV Procedure (Signed)
Electrical Cardioversion Procedure Note [name] [id number] 1936/07/19  Procedure: Electrical Cardioversion Indications:  Atrial Fibrillation  Procedure Details Consent: Risks of procedure as well as the alternatives and risks of each were explained to the (patient/caregiver).  Consent for procedure obtained. Time Out: Verified patient identification, verified procedure, site/side was marked, verified correct patient position, special equipment/implants available, medications/allergies/relevent history reviewed, required imaging and test results available.  Performed  Patient placed on cardiac monitor, pulse oximetry, supplemental oxygen as necessary.  Sedation given:  propofol Pacer pads placed anterior and posterior chest.  Cardioverted 2 time(s).  Cardioverted at 150J unsuccessful, 200J successful.  Evaluation Findings: Post procedure EKG shows: NSR Complications: None Patient did tolerate procedure well.   Skeet Latch, MD 08/13/2021, 1:07 PM

## 2021-08-13 NOTE — Interval H&P Note (Signed)
History and Physical Interval Note:  08/13/2021 12:50 PM  [name]  has presented today for surgery, with the diagnosis of AFIB.  The various methods of treatment have been discussed with the patient and family. After consideration of risks, benefits and other options for treatment, the patient has consented to  Procedure(s): CARDIOVERSION (N/A) as a surgical intervention.  The patient's history has been reviewed, patient examined, no change in status, stable for surgery.  I have reviewed the patient's chart and labs.  Questions were answered to the patient's satisfaction.     Skeet Latch, MD

## 2021-08-13 NOTE — Anesthesia Preprocedure Evaluation (Signed)
Anesthesia Evaluation  Patient identified by MRN, date of birth, ID band Patient awake    Reviewed: Allergy & Precautions, H&P , NPO status , Patient's Chart, lab work & pertinent test results, reviewed documented beta blocker date and time   Airway Mallampati: III  TM Distance: >3 FB Neck ROM: Full    Dental no notable dental hx. (+) Teeth Intact, Dental Advisory Given   Pulmonary neg pulmonary ROS,    Pulmonary exam normal breath sounds clear to auscultation       Cardiovascular hypertension, Pt. on medications and Pt. on home beta blockers + CAD  + dysrhythmias Atrial Fibrillation  Rhythm:Irregular Rate:Normal     Neuro/Psych TIAnegative psych ROS   GI/Hepatic negative GI ROS, Neg liver ROS,   Endo/Other  Hypothyroidism   Renal/GU negative Renal ROS  negative genitourinary   Musculoskeletal   Abdominal   Peds  Hematology negative hematology ROS (+)   Anesthesia Other Findings   Reproductive/Obstetrics negative OB ROS                             Anesthesia Physical Anesthesia Plan  ASA: 3  Anesthesia Plan: General   Post-op Pain Management:    Induction: Intravenous  PONV Risk Score and Plan: 3 and Propofol infusion and Treatment may vary due to age or medical condition  Airway Management Planned: Mask  Additional Equipment:   Intra-op Plan:   Post-operative Plan:   Informed Consent: I have reviewed the patients History and Physical, chart, labs and discussed the procedure including the risks, benefits and alternatives for the proposed anesthesia with the patient or authorized representative who has indicated his/her understanding and acceptance.     Dental advisory given  Plan Discussed with: CRNA  Anesthesia Plan Comments:         Anesthesia Quick Evaluation

## 2021-08-13 NOTE — Transfer of Care (Signed)
Immediate Anesthesia Transfer of Care Note  Patient: [name]  Procedure(s) Performed: CARDIOVERSION  Patient Location: Endoscopy Unit  Anesthesia Type:General  Level of Consciousness: awake, alert  and oriented  Airway & Oxygen Therapy: Patient Spontanous Breathing and Patient connected to nasal cannula oxygen  Post-op Assessment: Report given to RN and Post -op Vital signs reviewed and stable  Post vital signs: Reviewed and stable  Last Vitals:  Vitals Value Taken Time  BP 104/52   Temp    Pulse 66 08/13/21 1312  Resp 26 08/13/21 1312  SpO2 95 % 08/13/21 1312  Vitals shown include unvalidated device data.  Last Pain:  Vitals:   08/13/21 1201  TempSrc: Temporal  PainSc: 0-No pain         Complications: No notable events documented.

## 2021-08-14 ENCOUNTER — Encounter (HOSPITAL_COMMUNITY): Payer: Self-pay | Admitting: Cardiovascular Disease

## 2021-08-19 ENCOUNTER — Encounter: Payer: Self-pay | Admitting: Dermatology

## 2021-08-19 NOTE — Progress Notes (Signed)
Follow-Up Visit   Subjective  [name] is a 85 y.o. female who presents for the following: Follow-up (Check face and tops of hands. Patient has history of ak, seb k, bcc,scc and slight atypcal mole ).  Several areas of concern today including chest and face Location:  Duration:  Quality:  Associated Signs/Symptoms: Modifying Factors:  Severity:  Timing: Context:   Objective  Well appearing patient in no apparent distress; mood and affect are within normal limits. Right Hand - Posterior, facial (4) Gritty four millimeter and hornlike crusts  Right Inframammary Fold Biopsy proven isk still slightly inflamed with mild irritant intertrigo  Left Temple Historically enlarging pink crust, this may be adjacent to a white scar.  Rule out new carcinoma.     Right Buccal Cheek Ill-defined 1.2 centimeter pink crust with probable fibrosis       A focused examination was performed including head, neck, chest, arms, back.. Relevant physical exam findings are noted in the Assessment and Plan.   Assessment & Plan    AK (actinic keratosis) (5) Right Hand - Posterior; facial (4)  Destruction of lesion - Right Hand - Posterior, facial Complexity: simple   Destruction method: cryotherapy   Informed consent: discussed and consent obtained   Timeout:  patient name, date of birth, surgical site, and procedure verified Lesion destroyed using liquid nitrogen: Yes   Cryotherapy cycles:  3 Outcome: patient tolerated procedure well with no complications   Post-procedure details: wound care instructions given    Seborrheic keratosis, inflamed Right Inframammary Fold  Otc sample given triple paste   Neoplasm of uncertain behavior of skin Left Temple  Skin / nail biopsy Type of biopsy: tangential   Informed consent: discussed and consent obtained   Timeout: patient name, date of birth, surgical site, and procedure verified   Anesthesia: the lesion was anesthetized in a  standard fashion   Anesthetic:  1% lidocaine w/ epinephrine 1-100,000 local infiltration Instrument used: flexible razor blade   Hemostasis achieved with: aluminum chloride and electrodesiccation   Outcome: patient tolerated procedure well   Post-procedure details: wound care instructions given    Destruction of lesion Complexity: simple   Destruction method: electrodesiccation and curettage   Informed consent: discussed and consent obtained   Timeout:  patient name, date of birth, surgical site, and procedure verified Anesthesia: the lesion was anesthetized in a standard fashion   Anesthetic:  1% lidocaine w/ epinephrine 1-100,000 local infiltration Curettage performed in three different directions: Yes   Curettage cycles:  3 Lesion length (cm):  2 Lesion width (cm):  2 Margin per side (cm):  0 Final wound size (cm):  2 Hemostasis achieved with:  aluminum chloride Outcome: patient tolerated procedure well with no complications   Post-procedure details: wound care instructions given    Specimen 1 - Surgical pathology Differential Diagnosis: ak tx with bx   Check Margins: No  After shave biopsy the base of the lesion was treated by curettage plus cautery.  Carcinoma in situ of skin of other part of face Right Buccal Cheek  After shave biopsy the base was treated with curettage plus cautery  Skin / nail biopsy - Right Buccal Cheek Type of biopsy: tangential   Informed consent: discussed and consent obtained   Timeout: patient name, date of birth, surgical site, and procedure verified   Procedure prep:  Patient was prepped and draped in usual sterile fashion (Non sterile) Prep type:  Chlorhexidine Anesthesia: the lesion was anesthetized in a standard  fashion   Anesthetic:  1% lidocaine w/ epinephrine 1-100,000 local infiltration Instrument used: flexible razor blade   Outcome: patient tolerated procedure well   Post-procedure details: wound care instructions given     Destruction of lesion - Right Buccal Cheek Complexity: simple   Destruction method: electrodesiccation and curettage   Informed consent: discussed and consent obtained   Timeout:  patient name, date of birth, surgical site, and procedure verified Anesthesia: the lesion was anesthetized in a standard fashion   Anesthetic:  1% lidocaine w/ epinephrine 1-100,000 local infiltration Curettage performed in three different directions: Yes   Curettage cycles:  3 Lesion length (cm):  1.5 Lesion width (cm):  1.5 Margin per side (cm):  0 Final wound size (cm):  1.5 Hemostasis achieved with:  aluminum chloride Outcome: patient tolerated procedure well with no complications   Post-procedure details: wound care instructions given    Specimen 2 - Surgical pathology Differential Diagnosis: ak tx with bx  Check Margins: No      I, Lavonna Monarch, MD, have reviewed all documentation for this visit.  The documentation on 08/19/21 for the exam, diagnosis, procedures, and orders are all accurate and complete.

## 2021-09-04 NOTE — Progress Notes (Signed)
Cardiology Clinic Note   Patient Name: [name] Date of Encounter: 09/05/2021  Primary Care Provider:  Reynold Bowen, MD Primary Cardiologist:  Minus Breeding, MD  Patient Profile    85 year old female with a history of CAD, atrial fibrillation s/p DCCV on 08/13/2021 on Eliquis (CHA2DS2-VASc 5), hypertension, elevated blood sugar, shortness of breath, and hyperkalemia who presents for follow-up related to atrial fibrillation.  Past Medical History    Past Medical History:  Diagnosis Date   CAD, UNSPECIFIED SITE    2009. 99% proximal RCA stenosis followed by 80% distal stenosis. The LAD has a 100% stenosis in the midsegment. The circumflex is patent with distal occlusion. She has been managed medically. Her EF was well-preserved.   CAROTID STENOSIS    DIVERTICULAR DISEASE    Essential hypertension, benign    Gallstones 2009   Gastritis    H/O blood clots 1984   DVT and PE   Hx of degenerative disc disease    HYPERLIPIDEMIA    MACULAR DEGENERATION    OSTEOPOROSIS    Pneumonia    Rheumatoid arthritis(714.0)    Thyroid cancer ([place]) 2008   Past Surgical History:  Procedure Laterality Date   ABDOMINAL HYSTERECTOMY  1984   APPENDECTOMY  1959   BREAST CYST EXCISION     left   CARDIOVERSION N/A 08/13/2021   Procedure: CARDIOVERSION;  Surgeon: Skeet Latch, MD;  Location: [facility] ENDOSCOPY;  Service: Cardiovascular;  Laterality: N/A;   CATARACT EXTRACTION, BILATERAL     CHOLECYSTECTOMY  2009   COLONOSCOPY     ESOPHAGOGASTRODUODENOSCOPY     IM NAILING FEMORAL SHAFT RETROGRADE  2010   left   TONSILLECTOMY     WRIST SURGERY  2004   Left    Allergies  Allergies  Allergen Reactions   Hydrocodone Other (See Comments)    "feels weird"   Prednisone Nausea And Vomiting    Oral    History of Present Illness    85 year old female with a past medical history including CAD, atrial fibrillation s/p DCCV on 08/13/2021 on Eliquis (CHA2DS2-VASc 5-hypertension, age >68,  vascular disease, female sex), hypertension, hyperlipidemia, carotid artery stenosis, elevated blood sugar, shortness of breath, and hyperkalemia.  Cardiac catheterization in 2009 showed 99% pRCA, 80% d RCA, 100% mLAD, preserved EF; medical management was advised. He was admitted to the hospital in 2018 with bronchitis. Troponin was elevated at the time. This was thought to be related to demand ischemia. She has a history of carotid artery disease, however repeat Doppler ultrasound in March 2021 showed no significant carotid stenosis. No further testing was advised.   More recently, at a follow-up visit on 07/10/2021, she reported increased dyspnea on exertion. EKG showed new onset atrial fibrillation. She denied palpitations, presyncope or syncope. Outpatient monitor showed atrial fibrillation rate controlled, no other sustained arrhythmias. CHA2DS2-VASc was 5 and she was switched from Plavix to Eliquis 2.5 mg twice daily. BNP was elevated at the time at 265.8. Echocardiogram on 07/22/2021 showed EF of 60 to 65%, no regional wall motion abnormalities, mild mitral valve and tricuspid valve regurgitation, mild aortic sclerosis with no evidence of valve stenosis.  Her potassium was elevated at the time at 5.4, reduction in dietary potassium was recommended and her losartan was decreased from 50 mg twice daily to 50 mg daily. Repeat BMET on 08/06/21 showed potassium of 4.7.  She underwent DCCV on 08/14/2019, with restoration of NSR.   She presents for follow-up today s/p DCCV. Since her cardioversion she is  doing well overall from a cardiac standpoint. EKG today shows normal sinus rhythm first-degree AV block, 69 bpm. She reports a great improvement in her shortness of breath since her return to normal sinus rhythm. She does have mild baseline dyspnea on exertion, this is unchanged from previous.  She reports occasional mild lightheadedness, however she feels this has improved overall since her DCCV.  She thinks it  could be related to dehydration as she restricts her fluid intake in the setting of history of hyponatremia. She is still taking aspirin 81 milligrams daily and Eliquis 2.5 mg twice daily. She denies any signs of bleeding other than occasional mild bleeding associated with chronic hemorrhoids. She denies chest pain, palpitations, pnd, orthopnea, n, v, syncope, edema, weight gain, or early satiety.   Home Medications    Current Outpatient Medications  Medication Sig Dispense Refill   Acetaminophen (TYLENOL PO) Take 1,000 mg by mouth 2 (two) times daily.     amLODipine (NORVASC) 5 MG tablet TAKE 1 AND 1/2 TABLETS(7.5 MG) BY MOUTH DAILY 45 tablet 5   apixaban (ELIQUIS) 2.5 MG TABS tablet Take 1 tablet (2.5 mg total) by mouth 2 (two) times daily. 180 tablet 1   apixaban (ELIQUIS) 2.5 MG TABS tablet Take 1 tablet (2.5 mg total) by mouth 2 (two) times daily. 28 tablet 0   Calcium Carbonate-Vitamin D (CALCIUM + D PO) Take 600 mg by mouth 2 (two) times daily. Lunch and bedtime     fish oil-omega-3 fatty acids 1000 MG capsule Take 1 capsule (1 g total) by mouth daily.     furosemide (LASIX) 20 MG tablet Take 1 tablet (20 mg total) by mouth daily. 3 tablet 0   isosorbide mononitrate (IMDUR) 30 MG 24 hr tablet Take 30 mg by mouth daily.     levothyroxine (SYNTHROID, LEVOTHROID) 88 MCG tablet Take 1 tablet (88 mcg total) by mouth daily before breakfast. 30 tablet 6   losartan (COZAAR) 50 MG tablet Take 1 tablet (50 mg total) by mouth daily. 180 tablet 1   metoprolol succinate (TOPROL-XL) 100 MG 24 hr tablet Take 100 mg by mouth 2 (two) times daily. Take with or immediately following a meal.     Multiple Vitamins-Minerals (CENTRUM SILVER 50+WOMEN) TABS Take 1 tablet by mouth daily.     Multiple Vitamins-Minerals (OCUVITE PRESERVISION PO) Take 1 tablet by mouth 2 (two) times daily before lunch and supper.     nitroGLYCERIN (NITROSTAT) 0.4 MG SL tablet Place 1 tablet (0.4 mg total) under the tongue every 5  (five) minutes as needed. 25 tablet 6   olopatadine (PATANOL) 0.1 % ophthalmic solution Place 1 drop into both eyes daily.     Tetrahydrozoline-Zn Sulfate (EYE DROPS A/C OP) Place 1 drop into both eyes every 6 (six) hours as needed (for dry eyes). Systain     atorvastatin (LIPITOR) 40 MG tablet Take 1 tablet (40 mg total) by mouth daily. 90 tablet 3   No current facility-administered medications for this visit.     Family History    Family History  Problem Relation Age of Onset   Heart attack Mother    Clotting disorder Mother    Heart disease Father    Diabetes Father    CAD Sister    Stroke Sister    Lung cancer Brother    Ovarian cancer Maternal Aunt    Lung cancer Maternal Uncle    Diabetes Niece    She indicated that her mother is deceased. She indicated that  her father is deceased. She indicated that her sister is alive. She indicated that her brother is deceased. She indicated that her maternal grandmother is deceased. She indicated that her maternal grandfather is deceased. She indicated that her paternal grandmother is deceased. She indicated that her paternal grandfather is deceased. She indicated that her daughter is alive. She indicated that the status of her maternal aunt is unknown. She indicated that her maternal uncle is deceased. She indicated that her niece is deceased.  Social History    Social History   Socioeconomic History   Marital status: Married    Spouse name: Not on file   Number of children: 1   Years of education: Not on file   Highest education level: Not on file  Occupational History   Occupation: retired    Comment: Network engineer  Tobacco Use   Smoking status: Never   Smokeless tobacco: Never  Vaping Use   Vaping Use: Never used  Substance and Sexual Activity   Alcohol use: No   Drug use: No   Sexual activity: Not on file  Other Topics Concern   Not on file  Social History Narrative   Married, retired1 daughter   Right handed   1 story    Social Determinants of Health   Financial Resource Strain: Not on file  Food Insecurity: Not on file  Transportation Needs: Not on file  Physical Activity: Not on file  Stress: Not on file  Social Connections: Not on file  Intimate Partner Violence: Not on file     Review of Systems    General:  No chills, fever, night sweats or weight changes.  Cardiovascular:  No chest pain, stable mild dyspnea on exertion, edema, orthopnea, palpitations, paroxysmal nocturnal dyspnea. Dermatological: No rash, lesions/masses Respiratory: No cough, dyspnea Urologic: No hematuria, dysuria Abdominal: No nausea, vomiting, diarrhea, bright red blood per rectum, melena, or hematemesis Neurologic: No visual changes, wkns, changes in mental status. All other systems reviewed and are otherwise negative except as noted above.    Physical Exam    VS:  BP 124/60   Pulse 69   Ht 5\' 3"  (1.6 m)   Wt 121 lb 9.6 oz (55.2 kg)   SpO2 98%   BMI 21.54 kg/m   GEN: Well nourished, well developed, in no acute distress. HEENT: normal. Neck: Supple, no JVD, carotid bruits, or masses. Cardiac: RRR, no murmurs, rubs, or gallops. No clubbing, cyanosis, edema.  Radials/DP/PT 2+ and equal bilaterally.  Respiratory:  Respirations regular and unlabored, clear to auscultation bilaterally. GI: Soft, nontender, nondistended, BS + x 4. MS: no deformity or atrophy. Skin: warm and dry, no rash. Neuro:  Strength and sensation are intact. Psych: Normal affect.  Accessory Clinical Findings    ECG personally reviewed by me today- Normal sinus rhythm, 1st degree AV block, 69 bpm - No acute changes  Lab Results  Component Value Date   WBC 5.0 07/10/2021   HGB 12.8 07/10/2021   HCT 38.5 07/10/2021   MCV 93 07/10/2021   PLT 223 07/10/2021   Lab Results  Component Value Date   CREATININE 0.77 08/06/2021   BUN 15 08/06/2021   NA 135 08/06/2021   K 4.7 08/06/2021   CL 92 (L) 08/06/2021   CO2 29 08/06/2021   Lab  Results  Component Value Date   ALT 17 07/10/2021   AST 30 07/10/2021   ALKPHOS 78 07/10/2021   BILITOT 0.7 07/10/2021   Lab Results  Component Value Date  CHOL 157 07/04/2015   HDL 79 07/04/2015   LDLCALC 61 07/04/2015   TRIG 84 07/04/2015   CHOLHDL 2.0 07/04/2015    Lab Results  Component Value Date   HGBA1C 6.3 (H) 08/06/2021    Assessment & Plan   1.  Atrial fibrillation s/p DCCV 08/13/2021: New onset atrial fibrillation diagnosed at follow-up visit on 07/10/2021.  She was transitioned from Plavix to Eliquis 2.5 mg twice daily. Echo showed EF of 65%, no regional wall motion abnormalities, no major valvular abnormalities. S/p DCCV on 08/14/2019.  EKG today shows NSR, 1st degree AV block, 69 bpm. CHA2DS2-VASc of 5. Continue Eliquis 2.5 mg twice daily, metoprolol succinate 100 mg twice daily.  She is inquiring about the cost of Eliquis.  We will provide samples in office today and patient assistance information. Advised her to contact us should she have any trouble obtaining her medications.  2. CAD: Cardiac catheterization in 2009 showed 99% pRCA, 80% d RCA, 100% mLAD, medical management was advised. No symptoms of angina.  Improved dyspnea on exertion.  No indication for additional ischemic evaluation at this time.  Will discontinue ASA in the setting of DOAC therapy. Continue losartan 50 mg daily, amlodipine 5 mg daily, metoprolol succinate 100 mg twice daily, isosorbide mononitrate 30 mg daily, and Lipitor 40 mg daily.  3. Hypertension: Blood pressure overall well controlled.  Home blood pressure readings range from 130s/60s with low reading of 110/60.  She will continue to monitor her blood pressure at home. If she has consistent readings in the 140s SBP, could consider increasing losartan with close monitoring of her potassium level. For now, continue losartan 50 mg daily, amlodipine 5 mg daily, metoprolol succinate 100 mg twice daily, isosorbide mononitrate 30 mg daily.  4.  Hyperlipidemia: LDL previously 56, well controlled, followed by PCP.  She is taking 1200 mg of fish oil daily instead of the 1000 mg documented on her medication list.  Continue atorvastatin 40 mg daily and fish oil 1200 mg daily.   5. Carotid artery disease: History of carotid artery disease, however repeat Doppler ultrasound in March 2021 showed no significant carotid stenosis. No bruit on exam today. No further testing advised at this time.  6. Hyperkalemia: Noted at prior office visit (5.4 on 07/10/2021), concern for hemolyzed specimen. Advised reduction in dietary potassium. Repeat BMET on 08/06/21 showed potassium of 4.7.  She remains on a reduced dose of her losartan at 50 mg daily (previously on 50 mg twice daily). If the need arises to increase her losartan dose in the future, will need to monitor potassium closely.   7. Prediabetes: Noted elevated blood sugars at home.  A1c 6.3 on 08/06/2021.  Follow-up with PCP.   8. Disposition: D/C ASA 81 mg. Continue Eliquis 2.5 mg twice daily. Follow-up with Dr. Percival Spanish as scheduled in 2 months.   Lenna Sciara, NP 09/05/2021, 12:45 PM

## 2021-09-05 ENCOUNTER — Encounter: Payer: Self-pay | Admitting: Adult Health

## 2021-09-05 ENCOUNTER — Other Ambulatory Visit: Payer: Self-pay

## 2021-09-05 ENCOUNTER — Ambulatory Visit: Payer: Medicare Other | Admitting: Nurse Practitioner

## 2021-09-05 VITALS — BP 124/60 | HR 69 | Ht 63.0 in | Wt 121.6 lb

## 2021-09-05 DIAGNOSIS — E875 Hyperkalemia: Secondary | ICD-10-CM

## 2021-09-05 DIAGNOSIS — I2510 Atherosclerotic heart disease of native coronary artery without angina pectoris: Secondary | ICD-10-CM | POA: Diagnosis not present

## 2021-09-05 DIAGNOSIS — I6529 Occlusion and stenosis of unspecified carotid artery: Secondary | ICD-10-CM

## 2021-09-05 DIAGNOSIS — I10 Essential (primary) hypertension: Secondary | ICD-10-CM | POA: Diagnosis not present

## 2021-09-05 DIAGNOSIS — E785 Hyperlipidemia, unspecified: Secondary | ICD-10-CM

## 2021-09-05 DIAGNOSIS — I480 Paroxysmal atrial fibrillation: Secondary | ICD-10-CM | POA: Diagnosis not present

## 2021-09-05 DIAGNOSIS — R7303 Prediabetes: Secondary | ICD-10-CM

## 2021-09-05 MED ORDER — APIXABAN 2.5 MG PO TABS: 2.5000 mg | ORAL_TABLET | Freq: Two times a day (BID) | ORAL | 0 refills | Status: DC

## 2021-09-05 MED ORDER — ATORVASTATIN CALCIUM 40 MG PO TABS: 40.0000 mg | ORAL_TABLET | Freq: Every day | ORAL | 3 refills | Status: DC

## 2021-09-05 NOTE — Patient Instructions (Signed)
Medication Instructions:  Stop Aspirin. *If you need a refill on your cardiac medications before your next appointment, please call your pharmacy*   Lab Work: No Labs If you have labs (blood work) drawn today and your tests are completely normal, you will receive your results only by: [place] (if you have MyChart) OR A paper copy in the mail If you have any lab test that is abnormal or we need to change your treatment, we will call you to review the results.   Testing/Procedures: No Testing   Follow-Up: At [facility], you and your health needs are our priority.  As part of our continuing mission to provide you with exceptional heart care, we have created designated Provider Care Teams.  These Care Teams include your primary Cardiologist (physician) and Advanced Practice Providers (APPs -  Physician Assistants and Nurse Practitioners) who all work together to provide you with the care you need, when you need it.  We recommend signing up for the patient portal called "MyChart".  Sign up information is provided on this After Visit Summary.  MyChart is used to connect with patients for Virtual Visits (Telemedicine).  Patients are able to view lab/test results, encounter notes, upcoming appointments, etc.  Non-urgent messages can be sent to your provider as well.   To learn more about what you can do with MyChart, go to [email or web address].    Your next appointment:   December 01, 2021 8:20 AM  The format for your next appointment:   In Person  Provider:   Minus Breeding, MD

## 2021-09-10 ENCOUNTER — Other Ambulatory Visit: Payer: Self-pay

## 2021-09-10 ENCOUNTER — Encounter (INDEPENDENT_AMBULATORY_CARE_PROVIDER_SITE_OTHER): Payer: Self-pay | Admitting: Ophthalmology

## 2021-09-10 ENCOUNTER — Ambulatory Visit (INDEPENDENT_AMBULATORY_CARE_PROVIDER_SITE_OTHER): Payer: Medicare Other | Admitting: Ophthalmology

## 2021-09-10 DIAGNOSIS — H353212 Exudative age-related macular degeneration, right eye, with inactive choroidal neovascularization: Secondary | ICD-10-CM | POA: Diagnosis not present

## 2021-09-10 DIAGNOSIS — H348322 Tributary (branch) retinal vein occlusion, left eye, stable: Secondary | ICD-10-CM

## 2021-09-10 DIAGNOSIS — H353132 Nonexudative age-related macular degeneration, bilateral, intermediate dry stage: Secondary | ICD-10-CM | POA: Diagnosis not present

## 2021-09-10 NOTE — Assessment & Plan Note (Signed)
History of BRVO in the past, last visit 6 months previous.  No interval change in acuity noted per patient

## 2021-09-10 NOTE — Assessment & Plan Note (Signed)
No signs of CNVM OU 

## 2021-09-10 NOTE — Assessment & Plan Note (Signed)
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

## 2021-09-10 NOTE — Patient Instructions (Signed)
Patient to report promptly any new distortion in vision in either eye

## 2021-09-10 NOTE — Progress Notes (Signed)
09/10/2021     CHIEF COMPLAINT Patient presents for  Chief Complaint  Patient presents with   Retina Evaluation      HISTORY OF PRESENT ILLNESS: [name] is a 85 y.o. female who presents to the clinic today for:   HPI     Retina Evaluation           Laterality: both eyes         Comments   Today at 6 months post most recent evaluation for branch retinal vein occlusion and macular edema      Last edited by Hurman Horn, MD on 09/10/2021 11:16 AM.      Referring physician: Reynold Bowen, [address],  [place] [address]  HISTORICAL INFORMATION:   Selected notes from the [facility]    Lab Results  Component Value Date   HGBA1C 6.3 (H) 08/06/2021     CURRENT MEDICATIONS: Current Outpatient Medications (Ophthalmic Drugs)  Medication Sig   olopatadine (PATANOL) 0.1 % ophthalmic solution Place 1 drop into both eyes daily.   Tetrahydrozoline-Zn Sulfate (EYE DROPS A/C OP) Place 1 drop into both eyes every 6 (six) hours as needed (for dry eyes). Systain   No current facility-administered medications for this visit. (Ophthalmic Drugs)   Current Outpatient Medications (Other)  Medication Sig   Acetaminophen (TYLENOL PO) Take 1,000 mg by mouth 2 (two) times daily.   amLODipine (NORVASC) 5 MG tablet TAKE 1 AND 1/2 TABLETS(7.5 MG) BY MOUTH DAILY   apixaban (ELIQUIS) 2.5 MG TABS tablet Take 1 tablet (2.5 mg total) by mouth 2 (two) times daily.   apixaban (ELIQUIS) 2.5 MG TABS tablet Take 1 tablet (2.5 mg total) by mouth 2 (two) times daily.   atorvastatin (LIPITOR) 40 MG tablet Take 1 tablet (40 mg total) by mouth daily.   Calcium Carbonate-Vitamin D (CALCIUM + D PO) Take 600 mg by mouth 2 (two) times daily. Lunch and bedtime   furosemide (LASIX) 20 MG tablet Take 1 tablet (20 mg total) by mouth daily.   isosorbide mononitrate (IMDUR) 30 MG 24 hr tablet Take 30 mg by mouth daily.   levothyroxine (SYNTHROID, LEVOTHROID) 88 MCG tablet  Take 1 tablet (88 mcg total) by mouth daily before breakfast.   losartan (COZAAR) 50 MG tablet Take 1 tablet (50 mg total) by mouth daily.   metoprolol succinate (TOPROL-XL) 100 MG 24 hr tablet Take 100 mg by mouth 2 (two) times daily. Take with or immediately following a meal.   Multiple Vitamins-Minerals (CENTRUM SILVER 50+WOMEN) TABS Take 1 tablet by mouth daily.   Multiple Vitamins-Minerals (OCUVITE PRESERVISION PO) Take 1 tablet by mouth 2 (two) times daily before lunch and supper.   nitroGLYCERIN (NITROSTAT) 0.4 MG SL tablet Place 1 tablet (0.4 mg total) under the tongue every 5 (five) minutes as needed.   Omega 3 1200 MG CAPS Take 1 capsule by mouth daily.   No current facility-administered medications for this visit. (Other)      REVIEW OF SYSTEMS:    ALLERGIES Allergies  Allergen Reactions   Hydrocodone Other (See Comments)    "feels weird"   Prednisone Nausea And Vomiting    Oral    PAST MEDICAL HISTORY Past Medical History:  Diagnosis Date   CAD, UNSPECIFIED SITE    2009. 99% proximal RCA stenosis followed by 80% distal stenosis. The LAD has a 100% stenosis in the midsegment. The circumflex is patent with distal occlusion. She has been managed medically. Her EF  was well-preserved.   CAROTID STENOSIS    DIVERTICULAR DISEASE    Essential hypertension, benign    Gallstones 2009   Gastritis    H/O blood clots 1984   DVT and PE   Hx of degenerative disc disease    HYPERLIPIDEMIA    MACULAR DEGENERATION    OSTEOPOROSIS    Pneumonia    Rheumatoid arthritis(714.0)    Thyroid cancer ([place]) 2008   Past Surgical History:  Procedure Laterality Date   ABDOMINAL HYSTERECTOMY  1984   APPENDECTOMY  1959   BREAST CYST EXCISION     left   CARDIOVERSION N/A 08/13/2021   Procedure: CARDIOVERSION;  Surgeon: Skeet Latch, MD;  Location: [facility] ENDOSCOPY;  Service: Cardiovascular;  Laterality: N/A;   CATARACT EXTRACTION, BILATERAL     CHOLECYSTECTOMY  2009   COLONOSCOPY      ESOPHAGOGASTRODUODENOSCOPY     IM NAILING FEMORAL SHAFT RETROGRADE  2010   left   TONSILLECTOMY     WRIST SURGERY  2004   Left    FAMILY HISTORY Family History  Problem Relation Age of Onset   Heart attack Mother    Clotting disorder Mother    Heart disease Father    Diabetes Father    CAD Sister    Stroke Sister    Lung cancer Brother    Ovarian cancer Maternal Aunt    Lung cancer Maternal Uncle    Diabetes Niece     SOCIAL HISTORY Social History   Tobacco Use   Smoking status: Never   Smokeless tobacco: Never  Vaping Use   Vaping Use: Never used  Substance Use Topics   Alcohol use: No   Drug use: No         OPHTHALMIC EXAM:  Base Eye Exam     Visual Acuity (ETDRS)       Right Left   Dist [place] 20/40 +2 20/40 +1         Tonometry (Tonopen, 10:26 AM)       Right Left   Pressure 18 16         Pupils       Pupils APD   Right PERRL None   Left PERRL None         Visual Fields       Left Right    Full Full         Extraocular Movement       Right Left    Full, Ortho Full, Ortho         Neuro/Psych     Oriented x3: Yes   Mood/Affect: Normal         Dilation     Both eyes: 1.0% Mydriacyl, 2.5% Phenylephrine @ 10:25 AM           Slit Lamp and Fundus Exam     External Exam       Right Left   External Normal Normal         Slit Lamp Exam       Right Left   Lids/Lashes Normal Normal   Conjunctiva/Sclera White and quiet White and quiet   Cornea Clear Clear   Anterior Chamber Deep and quiet Deep and quiet   Iris Round and reactive Round and reactive   Lens Posterior chamber intraocular lens, Centered posterior chamber intraocular lens Posterior chamber intraocular lens, Centered posterior chamber intraocular lens   Anterior Vitreous Normal Normal         Fundus Exam  Right Left   Posterior Vitreous Normal Normal   Disc Normal Normal   C/D Ratio 0.4 0.45   Macula no macular thickening, Drusen,  Hard drusen, no hemorrhage, Geographic atrophy, regional, 1 DAs size, temporal Inferior Temporal FAZ region Microaneurysms, no macular thickening, Hard drusen   Vessels Normal old brvo inf macula   Periphery Normal Normal            IMAGING AND PROCEDURES  Imaging and Procedures for 09/10/21  OCT, Retina - OU - Both Eyes       Right Eye Quality was good. Scan locations included subfoveal. Central Foveal Thickness: 273. Progression has improved. Findings include abnormal foveal contour.   Left Eye Quality was good. Central Foveal Thickness: 356. Findings include abnormal foveal contour.   Notes Much less macular edema the right eye, old BRVO  No active maculopathy OS although some minor thickening present             ASSESSMENT/PLAN:  Stable branch retinal vein occlusion of left eye History of BRVO in the past, last visit 6 months previous.  No interval change in acuity noted per patient  Exudative age-related macular degeneration of right eye with inactive choroidal neovascularization (HCC) History of wet AMD in the past, no active disease OD today  Intermediate stage nonexudative age-related macular degeneration of both eyes No signs of CNVM OU     ICD-10-CM   1. Stable branch retinal vein occlusion of left eye  H34.8322 OCT, Retina - OU - Both Eyes    2. Exudative age-related macular degeneration of right eye with inactive choroidal neovascularization (HCC)  H35.3212 OCT, Retina - OU - Both Eyes    3. Intermediate stage nonexudative age-related macular degeneration of both eyes  H35.3132       1.  OU stable vision and stable macular pathology.  No sign of recurrence of BRVO OS and no sign of recurrent CNVM OD  2.  3.  Ophthalmic Meds Ordered this visit:  No orders of the defined types were placed in this encounter.      Return in about 9 months (around 06/11/2022) for DILATE OU, COLOR FP, OCT.  There are no Patient Instructions on file for this  visit.   Explained the diagnoses, plan, and follow up with the patient and they expressed understanding.  Patient expressed understanding of the importance of proper follow up care.   Clent Demark Avannah Decker M.D. Diseases & Surgery of the Retina and Vitreous [facility] [place] 09/10/21     Abbreviations: M myopia (nearsighted); A astigmatism; H hyperopia (farsighted); P presbyopia; Mrx spectacle prescription;  CTL contact lenses; OD right eye; OS left eye; OU both eyes  XT exotropia; ET esotropia; PEK punctate epithelial keratitis; PEE punctate epithelial erosions; DES dry eye syndrome; MGD meibomian gland dysfunction; ATs artificial tears; PFAT's preservative free artificial tears; [place] nuclear sclerotic cataract; PSC posterior subcapsular cataract; ERM epi-retinal membrane; PVD posterior vitreous detachment; RD retinal detachment; DM diabetes mellitus; DR diabetic retinopathy; NPDR non-proliferative diabetic retinopathy; PDR proliferative diabetic retinopathy; CSME clinically significant macular edema; DME diabetic macular edema; dbh dot blot hemorrhages; CWS cotton wool spot; POAG primary open angle glaucoma; C/D cup-to-disc ratio; HVF humphrey visual field; GVF goldmann visual field; OCT optical coherence tomography; IOP intraocular pressure; BRVO Branch retinal vein occlusion; CRVO central retinal vein occlusion; CRAO central retinal artery occlusion; BRAO branch retinal artery occlusion; RT retinal tear; SB scleral buckle; PPV pars plana vitrectomy; VH Vitreous hemorrhage; PRP panretinal laser photocoagulation;  IVK intravitreal kenalog; VMT vitreomacular traction; MH Macular hole;  NVD neovascularization of the disc; NVE neovascularization elsewhere; AREDS age related eye disease study; ARMD age related macular degeneration; POAG primary open angle glaucoma; EBMD epithelial/anterior basement membrane dystrophy; ACIOL anterior chamber intraocular lens; IOL intraocular lens; PCIOL posterior chamber  intraocular lens; Phaco/IOL phacoemulsification with intraocular lens placement; [place] photorefractive keratectomy; LASIK laser assisted in situ keratomileusis; HTN hypertension; DM diabetes mellitus; COPD chronic obstructive pulmonary disease

## 2021-10-14 DIAGNOSIS — E871 Hypo-osmolality and hyponatremia: Secondary | ICD-10-CM | POA: Diagnosis not present

## 2021-10-14 DIAGNOSIS — I700 Atherosclerosis of aorta: Secondary | ICD-10-CM | POA: Diagnosis not present

## 2021-10-14 DIAGNOSIS — E890 Postprocedural hypothyroidism: Secondary | ICD-10-CM | POA: Diagnosis not present

## 2021-10-14 DIAGNOSIS — E785 Hyperlipidemia, unspecified: Secondary | ICD-10-CM | POA: Diagnosis not present

## 2021-11-05 ENCOUNTER — Telehealth: Payer: Self-pay | Admitting: Cardiology

## 2021-11-05 NOTE — Telephone Encounter (Signed)
° °  Name: [name]  DOB: [birth year]  MRN: [id number]   Primary Cardiologist: Minus Breeding, MD  Chart reviewed as part of pre-operative protocol coverage. Patient was contacted 11/05/2021 in reference to pre-operative risk assessment for pending surgery as outlined below.  [name] was last seen on 09/05/2021 by Diona Browner NP.  Since that day, [name] has done well without exertional chest pain or worsening dyspnea.  Therefore, based on ACC/AHA guidelines, the patient would be at acceptable risk for the planned procedure without further cardiovascular testing.   Patient may hold Eliquis for 2 days prior to the dental procedure and restart as soon as possible afterward at the surgeon's discretion.  She does not need SBE prophylaxis from the cardiac perspective.  The patient was advised that if she develops new symptoms prior to surgery to contact our office to arrange for a follow-up visit, and she verbalized understanding.  I will route this recommendation to the requesting party via Epic fax function and remove from pre-op pool. Please call with questions.  [place], [place] 11/05/2021, 2:35 PM

## 2021-11-05 NOTE — Telephone Encounter (Signed)
° °  [facility] Pre-operative Risk Assessment    Request for surgical clearance:  What type of surgery is being performed?  Surgical Removal of #3 with Possible Sinus Closure    When is this surgery scheduled?  TBD    What type of clearance is required (medical clearance vs. Pharmacy clearance to hold med vs. Both)?  Both   Are there any medications that need to be held prior to surgery and how long? Eliquis, 2 days prior   Practice name and name of physician performing surgery?  Dr. Arnetha Courser, DDS   [facility]  What is your office phone number? [phone]    7.   What is your office fax number? [phone]  8.   Anesthesia type (None, local, MAC, general) ?  Local and Nitrous  [name] 11/05/2021, 10:58 AM

## 2021-11-05 NOTE — Telephone Encounter (Signed)
Patient with diagnosis of afib on Eliquis for anticoagulation.    Procedure: single surgical dental extraction with possible sinus closure Date of procedure: TBD   CHA2DS2-VASc Score = 7  This indicates a 11.2% annual risk of stroke. The patient's score is based upon: CHF History: 0 HTN History: 1 Diabetes History: 0 Stroke History: 2 (Pt had TIA documented in Epic in 2015, afib not dx unti 07/2021) Vascular Disease History: 1 Age Score: 2 Gender Score: 1   Also with hx of post op DVT and PE in 1984. Underwent DCCV 08/13/21.  CrCl 32mL/min Platelet count 223K  Patient does not require pre-op antibiotics for dental procedure.  Per office protocol, patient can hold Eliquis for 2 days prior to procedure as requested.

## 2021-11-05 NOTE — Telephone Encounter (Signed)
Clinical pharmacist to review Eliquis 

## 2021-11-09 ENCOUNTER — Other Ambulatory Visit: Payer: Self-pay | Admitting: Cardiology

## 2021-11-11 ENCOUNTER — Telehealth: Payer: Self-pay | Admitting: Cardiology

## 2021-11-11 MED ORDER — AMLODIPINE BESYLATE 5 MG PO TABS: 7.5000 mg | ORAL_TABLET | Freq: Every day | ORAL | 1 refills | Status: DC

## 2021-11-11 NOTE — Telephone Encounter (Signed)
Pt c/o medication issue:  1. Name of Medication: amLODipine (NORVASC) 5 MG tablet  2. How are you currently taking this medication (dosage and times per day)? Take 1 tablet (5 mg total) by mouth daily. Keep upcoming appointment for future refills.  3. Are you having a reaction (difficulty breathing--STAT)? no  4. What is your medication issue? Patient states the prescription for this medication is different from what she was taking before

## 2021-11-11 NOTE — Telephone Encounter (Signed)
Spoke with patient who questioned amlodipine prescription. She has been taking 7.5 mg every night, then received a prescription for 5 mg. Please clarify. Blood pressure while on the phone was 150/64.

## 2021-11-11 NOTE — Telephone Encounter (Signed)
Spoke with pt, aware to continue amlodipine 7.5 mg once daily. She will cut the 5 mg tablets in 1/2. She has pleanty and does not need a refill at this time.

## 2021-11-29 DIAGNOSIS — E875 Hyperkalemia: Secondary | ICD-10-CM | POA: Insufficient documentation

## 2021-11-29 NOTE — Progress Notes (Addendum)
Cardiology Office Note   Date:  12/01/2021   ID:  [name], DOB [birth year], MRN [id number]  PCP:  Reynold Bowen, MD  Cardiologist:   Minus Breeding, MD   Chief Complaint  Patient presents with   Coronary Artery Disease       History of Present Illness: [name] is a 86 y.o. female who presents for follow up of CAD and atrial fibrillation.  She was admitted to the hospital in December 2019 with bronchitis.  She had an elevated troponin which was thought to be demand ischemia.  She was in new onset atrial fib recently.  Since I last saw her she has done well.  She had a cardioversion in November for persistent atrial fibrillation.  She states she has not had as much breathlessness since she has been in sinus rhythm.  She denies any problems with the blood thinner.  She had no palpitations, presyncope or syncope.  She has had no new shortness of breath, PND or orthopnea.  She has had no weight gain or edema.  She did have some confusion about how much amlodipine she was taking as she was told to take 5 mg but I had her take a 7.5.  She brings a blood pressure diary and it was excellent on the 7.5.   Past Medical History:  Diagnosis Date   CAD, UNSPECIFIED SITE    2009. 99% proximal RCA stenosis followed by 80% distal stenosis. The LAD has a 100% stenosis in the midsegment. The circumflex is patent with distal occlusion. She has been managed medically. Her EF was well-preserved.   CAROTID STENOSIS    DIVERTICULAR DISEASE    Essential hypertension, benign    Gallstones 2009   Gastritis    H/O blood clots 1984   DVT and PE   Hx of degenerative disc disease    HYPERLIPIDEMIA    MACULAR DEGENERATION    OSTEOPOROSIS    Pneumonia    Rheumatoid arthritis(714.0)    Thyroid cancer ([place]) 2008    Past Surgical History:  Procedure Laterality Date   ABDOMINAL HYSTERECTOMY  1984   APPENDECTOMY  1959   BREAST CYST EXCISION     left   CARDIOVERSION N/A 08/13/2021    Procedure: CARDIOVERSION;  Surgeon: Skeet Latch, MD;  Location: [facility];  Service: Cardiovascular;  Laterality: N/A;   CATARACT EXTRACTION, BILATERAL     CHOLECYSTECTOMY  2009   COLONOSCOPY     ESOPHAGOGASTRODUODENOSCOPY     IM NAILING FEMORAL SHAFT RETROGRADE  2010   left   TONSILLECTOMY     WRIST SURGERY  2004   Left     Current Outpatient Medications  Medication Sig Dispense Refill   Acetaminophen (TYLENOL PO) Take 1,000 mg by mouth 2 (two) times daily.     apixaban (ELIQUIS) 2.5 MG TABS tablet Take 1 tablet (2.5 mg total) by mouth 2 (two) times daily. 180 tablet 1   apixaban (ELIQUIS) 2.5 MG TABS tablet Take 1 tablet (2.5 mg total) by mouth 2 (two) times daily. 28 tablet 0   atorvastatin (LIPITOR) 40 MG tablet Take 1 tablet (40 mg total) by mouth daily. 90 tablet 3   Calcium Carbonate-Vitamin D (CALCIUM + D PO) Take 600 mg by mouth 2 (two) times daily. Lunch and bedtime     isosorbide mononitrate (IMDUR) 30 MG 24 hr tablet Take 30 mg by mouth daily.     levothyroxine (SYNTHROID, LEVOTHROID) 88 MCG tablet Take 1 tablet (88 mcg  total) by mouth daily before breakfast. 30 tablet 6   losartan (COZAAR) 50 MG tablet Take 1 tablet (50 mg total) by mouth daily. 180 tablet 1   metoprolol succinate (TOPROL-XL) 100 MG 24 hr tablet Take 100 mg by mouth 2 (two) times daily. Take with or immediately following a meal.     Multiple Vitamins-Minerals (CENTRUM SILVER 50+WOMEN) TABS Take 1 tablet by mouth daily.     Multiple Vitamins-Minerals (OCUVITE PRESERVISION PO) Take 1 tablet by mouth 2 (two) times daily before lunch and supper.     nitroGLYCERIN (NITROSTAT) 0.4 MG SL tablet Place 1 tablet (0.4 mg total) under the tongue every 5 (five) minutes as needed. 25 tablet 6   olopatadine (PATANOL) 0.1 % ophthalmic solution Place 1 drop into both eyes daily.     Omega 3 1200 MG CAPS Take 1 capsule by mouth daily.     Tetrahydrozoline-Zn Sulfate (EYE DROPS A/C OP) Place 1 drop into both eyes  every 6 (six) hours as needed (for dry eyes). Systain     amLODipine (NORVASC) 2.5 MG tablet Take 1 tablet (2.5 mg total) by mouth in the morning and at bedtime. 180 tablet 3   amLODipine (NORVASC) 5 MG tablet Take 1 tablet (5 mg total) by mouth in the morning and at bedtime. 180 tablet 3   No current facility-administered medications for this visit.    Allergies:   Hydrocodone and Prednisone     ROS:  Please see the history of present illness.   Otherwise, review of systems are positive for none.   All other systems are reviewed and negative.    PHYSICAL EXAM: VS:  BP (!) 144/66    Pulse 67    Ht 5\' 3"  (1.6 m)    Wt 123 lb 6.4 oz (56 kg)    SpO2 98%    BMI 21.86 kg/m  , BMI Body mass index is 21.86 kg/m. GENERAL:  Well appearing NECK:  No jugular venous distention, waveform within normal limits, carotid upstroke brisk and symmetric, no bruits, no thyromegaly LUNGS:  Clear to auscultation bilaterally CHEST:  Unremarkable HEART:  PMI not displaced or sustained,S1 and S2 within normal limits, no S3, no S4, no clicks, no rubs, soft apical systolic murmur nonradiating, no diastolic murmurs ABD:  Flat, positive bowel sounds normal in frequency in pitch, no bruits, no rebound, no guarding, no midline pulsatile mass, no hepatomegaly, no splenomegaly EXT:  2 plus pulses throughout, mild bilateral ankle edema, no cyanosis no clubbing   EKG:  EKG is ordered today. Sinus rhythm, rate 67, axis within normal limits, intervals within normal limits, first-degree AV block, no acute ST-T wave changes.   Recent Labs: 07/10/2021: ALT 17; BNP 265.8; Hemoglobin 12.8; Platelets 223; TSH 0.966 08/06/2021: BUN 15; Creatinine, Ser 0.77; Potassium 4.7; Sodium 135    Lipid Panel    Component Value Date/Time   CHOL 157 07/04/2015 0856   TRIG 84 07/04/2015 0856   HDL 79 07/04/2015 0856   CHOLHDL 2.0 07/04/2015 0856   VLDL 17 07/04/2015 0856   LDLCALC 61 07/04/2015 0856      Wt Readings from Last 3  Encounters:  12/01/21 123 lb 6.4 oz (56 kg)  09/05/21 121 lb 9.6 oz (55.2 kg)  08/13/21 127 lb (57.6 kg)      Other studies Reviewed: Additional studies/ records that were reviewed today include:  Labs. Review of the above records demonstrates: See elsewhere   ASSESSMENT AND PLAN:    CAD:  The patient has no new sypmtoms.  No further cardiovascular testing is indicated.  We will continue with aggressive risk reduction and meds as listed.  ATRIAL FIB: This has been persistent on her monitor.  She is tolerating her anticoagulation. Ms. EMMERSON SHUFFIELD has a CHA2DS2 - VASc score of 5.    No change in therapy.    HTN: Her blood pressure is at target according to the blood pressure diary she brings me.  She is to be on 7.5 mg twice daily of the amlodipine and we clarified this.  A  ELEVATED BS:    A1c was 5.6.  No change in therapy.  SOB:   This is improved in sinus rhythm.  No change in therapy. atrial fibrillation.   HYPERKALEMIA:     She did have trouble with this previously but now is 4.7.  No change in therapy.  Current medicines are reviewed at length with the patient today.  The patient does not have concerns regarding medicines.  The following changes have been made: None  Labs/ tests ordered today include:   None  Orders Placed This Encounter  Procedures   EKG 12-Lead     Disposition:   FU with me in six months.   Signed, Minus Breeding, MD  12/01/2021 8:54 AM    [facility]

## 2021-12-01 ENCOUNTER — Encounter: Payer: Self-pay | Admitting: Cardiology

## 2021-12-01 ENCOUNTER — Ambulatory Visit: Payer: Medicare Other | Admitting: Cardiology

## 2021-12-01 ENCOUNTER — Other Ambulatory Visit: Payer: Self-pay

## 2021-12-01 VITALS — BP 144/66 | HR 67 | Ht 63.0 in | Wt 123.4 lb

## 2021-12-01 DIAGNOSIS — R0602 Shortness of breath: Secondary | ICD-10-CM | POA: Diagnosis not present

## 2021-12-01 DIAGNOSIS — I4819 Other persistent atrial fibrillation: Secondary | ICD-10-CM

## 2021-12-01 DIAGNOSIS — E875 Hyperkalemia: Secondary | ICD-10-CM

## 2021-12-01 DIAGNOSIS — I2510 Atherosclerotic heart disease of native coronary artery without angina pectoris: Secondary | ICD-10-CM | POA: Diagnosis not present

## 2021-12-01 DIAGNOSIS — I10 Essential (primary) hypertension: Secondary | ICD-10-CM | POA: Diagnosis not present

## 2021-12-01 MED ORDER — AMLODIPINE BESYLATE 5 MG PO TABS: 5.0000 mg | ORAL_TABLET | Freq: Every day | ORAL | 3 refills | Status: DC

## 2021-12-01 MED ORDER — AMLODIPINE BESYLATE 5 MG PO TABS: 5.0000 mg | ORAL_TABLET | Freq: Two times a day (BID) | ORAL | 3 refills | Status: DC

## 2021-12-01 MED ORDER — AMLODIPINE BESYLATE 2.5 MG PO TABS: 2.5000 mg | ORAL_TABLET | Freq: Every day | ORAL | 3 refills | Status: DC

## 2021-12-01 MED ORDER — AMLODIPINE BESYLATE 2.5 MG PO TABS: 2.5000 mg | ORAL_TABLET | Freq: Two times a day (BID) | ORAL | 3 refills | Status: DC

## 2021-12-01 NOTE — Patient Instructions (Addendum)
Medication Instructions:   -Amlodipine (norvasc) 7.5mg  twice daily.    *If you need a refill on your cardiac medications before your next appointment, please call your pharmacy*   Follow-Up: At [facility], you and your health needs are our priority.  As part of our continuing mission to provide you with exceptional heart care, we have created designated Provider Care Teams.  These Care Teams include your primary Cardiologist (physician) and Advanced Practice Providers (APPs -  Physician Assistants and Nurse Practitioners) who all work together to provide you with the care you need, when you need it.  We recommend signing up for the patient portal called "MyChart".  Sign up information is provided on this After Visit Summary.  MyChart is used to connect with patients for Virtual Visits (Telemedicine).  Patients are able to view lab/test results, encounter notes, upcoming appointments, etc.  Non-urgent messages can be sent to your provider as well.   To learn more about what you can do with MyChart, go to [email or web address].    Your next appointment:   6 month(s)  The format for your next appointment:   In Person  Provider:   Minus Breeding, MD

## 2022-02-01 ENCOUNTER — Other Ambulatory Visit: Payer: Self-pay | Admitting: Cardiology

## 2022-03-07 ENCOUNTER — Other Ambulatory Visit: Payer: Self-pay | Admitting: Cardiology

## 2022-03-09 NOTE — Telephone Encounter (Signed)
Prescription refill request for Eliquis received. Indication:Afib Last office visit:2/23 Scr:0.7 Age: 86 Weight:56 kg  Prescription refilled

## 2022-04-03 DIAGNOSIS — I10 Essential (primary) hypertension: Secondary | ICD-10-CM | POA: Diagnosis not present

## 2022-04-03 DIAGNOSIS — I129 Hypertensive chronic kidney disease with stage 1 through stage 4 chronic kidney disease, or unspecified chronic kidney disease: Secondary | ICD-10-CM | POA: Diagnosis not present

## 2022-04-03 DIAGNOSIS — E785 Hyperlipidemia, unspecified: Secondary | ICD-10-CM | POA: Diagnosis not present

## 2022-04-03 DIAGNOSIS — I2510 Atherosclerotic heart disease of native coronary artery without angina pectoris: Secondary | ICD-10-CM | POA: Diagnosis not present

## 2022-04-09 DIAGNOSIS — E785 Hyperlipidemia, unspecified: Secondary | ICD-10-CM | POA: Diagnosis not present

## 2022-04-09 DIAGNOSIS — M810 Age-related osteoporosis without current pathological fracture: Secondary | ICD-10-CM | POA: Diagnosis not present

## 2022-04-09 DIAGNOSIS — I10 Essential (primary) hypertension: Secondary | ICD-10-CM | POA: Diagnosis not present

## 2022-04-09 DIAGNOSIS — R7303 Prediabetes: Secondary | ICD-10-CM | POA: Diagnosis not present

## 2022-04-13 DIAGNOSIS — Z1231 Encounter for screening mammogram for malignant neoplasm of breast: Secondary | ICD-10-CM | POA: Diagnosis not present

## 2022-04-16 DIAGNOSIS — I10 Essential (primary) hypertension: Secondary | ICD-10-CM | POA: Diagnosis not present

## 2022-04-16 DIAGNOSIS — E890 Postprocedural hypothyroidism: Secondary | ICD-10-CM | POA: Diagnosis not present

## 2022-04-16 DIAGNOSIS — C73 Malignant neoplasm of thyroid gland: Secondary | ICD-10-CM | POA: Diagnosis not present

## 2022-04-16 DIAGNOSIS — Z0000 Encounter for general adult medical examination without abnormal findings: Secondary | ICD-10-CM | POA: Diagnosis not present

## 2022-04-16 DIAGNOSIS — E785 Hyperlipidemia, unspecified: Secondary | ICD-10-CM | POA: Diagnosis not present

## 2022-04-16 DIAGNOSIS — R82998 Other abnormal findings in urine: Secondary | ICD-10-CM | POA: Diagnosis not present

## 2022-04-22 DIAGNOSIS — H04123 Dry eye syndrome of bilateral lacrimal glands: Secondary | ICD-10-CM | POA: Diagnosis not present

## 2022-04-22 DIAGNOSIS — H11823 Conjunctivochalasis, bilateral: Secondary | ICD-10-CM | POA: Diagnosis not present

## 2022-04-22 DIAGNOSIS — H353132 Nonexudative age-related macular degeneration, bilateral, intermediate dry stage: Secondary | ICD-10-CM | POA: Diagnosis not present

## 2022-04-22 DIAGNOSIS — H348322 Tributary (branch) retinal vein occlusion, left eye, stable: Secondary | ICD-10-CM | POA: Diagnosis not present

## 2022-06-11 ENCOUNTER — Encounter (INDEPENDENT_AMBULATORY_CARE_PROVIDER_SITE_OTHER): Payer: Self-pay | Admitting: Ophthalmology

## 2022-06-11 ENCOUNTER — Ambulatory Visit (INDEPENDENT_AMBULATORY_CARE_PROVIDER_SITE_OTHER): Payer: Medicare Other | Admitting: Ophthalmology

## 2022-06-11 DIAGNOSIS — H353132 Nonexudative age-related macular degeneration, bilateral, intermediate dry stage: Secondary | ICD-10-CM

## 2022-06-11 DIAGNOSIS — H353212 Exudative age-related macular degeneration, right eye, with inactive choroidal neovascularization: Secondary | ICD-10-CM

## 2022-06-11 DIAGNOSIS — H348322 Tributary (branch) retinal vein occlusion, left eye, stable: Secondary | ICD-10-CM | POA: Diagnosis not present

## 2022-06-11 NOTE — Progress Notes (Signed)
Cardiology Office Note   Date:  06/12/2022   ID:  [name], DOB Apr 04, 1936, MRN [id number]  PCP:  Reynold Bowen, MD  Cardiologist:   Minus Breeding, MD   Chief Complaint  Patient presents with   Atrial Fibrillation       History of Present Illness: [name] is a 86 y.o. female who presents for follow up of CAD and atrial fibrillation.  She was admitted to the hospital in December 2019 with bronchitis.  She had an elevated troponin which was thought to be demand ischemia.  She was in new onset atrial fib.    Since I last saw her she has done well.  The patient denies any new symptoms such as chest discomfort, neck or arm discomfort. There has been no new shortness of breath, PND or orthopnea. There have been no reported palpitations, presyncope or syncope.  She walks on a treadmill at a slow pace.     Past Medical History:  Diagnosis Date   CAD, UNSPECIFIED SITE    2009. 99% proximal RCA stenosis followed by 80% distal stenosis. The LAD has a 100% stenosis in the midsegment. The circumflex is patent with distal occlusion. She has been managed medically. Her EF was well-preserved.   CAROTID STENOSIS    DIVERTICULAR DISEASE    Essential hypertension, benign    Gallstones 2009   Gastritis    H/O blood clots 1984   DVT and PE   Hx of degenerative disc disease    HYPERLIPIDEMIA    MACULAR DEGENERATION    OSTEOPOROSIS    Pneumonia    Rheumatoid arthritis(714.0)    Thyroid cancer ([place]) 2008    Past Surgical History:  Procedure Laterality Date   ABDOMINAL HYSTERECTOMY  1984   APPENDECTOMY  1959   BREAST CYST EXCISION     left   CARDIOVERSION N/A 08/13/2021   Procedure: CARDIOVERSION;  Surgeon: Skeet Latch, MD;  Location: [facility];  Service: Cardiovascular;  Laterality: N/A;   CATARACT EXTRACTION, BILATERAL     CHOLECYSTECTOMY  2009   COLONOSCOPY     ESOPHAGOGASTRODUODENOSCOPY     IM NAILING FEMORAL SHAFT RETROGRADE  2010   left    TONSILLECTOMY     WRIST SURGERY  2004   Left     Current Outpatient Medications  Medication Sig Dispense Refill   Acetaminophen (TYLENOL PO) Take 1,000 mg by mouth 2 (two) times daily.     amLODipine (NORVASC) 5 MG tablet Take 1 tablet (5 mg total) by mouth in the morning and at bedtime. 180 tablet 3   atorvastatin (LIPITOR) 40 MG tablet Take 1 tablet (40 mg total) by mouth daily. 90 tablet 3   Calcium Carbonate-Vitamin D (CALCIUM + D PO) Take 600 mg by mouth 2 (two) times daily. Lunch and bedtime     ELIQUIS 2.5 MG TABS tablet TAKE 1 TABLET(2.5 MG) BY MOUTH TWICE DAILY 180 tablet 1   isosorbide mononitrate (IMDUR) 30 MG 24 hr tablet Take 30 mg by mouth daily.     levothyroxine (SYNTHROID, LEVOTHROID) 88 MCG tablet Take 1 tablet (88 mcg total) by mouth daily before breakfast. 30 tablet 6   losartan (COZAAR) 50 MG tablet Take 1 tablet (50 mg total) by mouth daily. 180 tablet 1   metoprolol succinate (TOPROL-XL) 100 MG 24 hr tablet Take 100 mg by mouth 2 (two) times daily. Take with or immediately following a meal.     Multiple Vitamins-Minerals (CENTRUM SILVER 50+WOMEN)  TABS Take 1 tablet by mouth daily.     Multiple Vitamins-Minerals (OCUVITE PRESERVISION PO) Take 1 tablet by mouth 2 (two) times daily before lunch and supper.     nitroGLYCERIN (NITROSTAT) 0.4 MG SL tablet Place 1 tablet (0.4 mg total) under the tongue every 5 (five) minutes as needed. 25 tablet 6   olopatadine (PATANOL) 0.1 % ophthalmic solution Place 1 drop into both eyes daily.     Omega 3 1200 MG CAPS Take 1 capsule by mouth daily.     Tetrahydrozoline-Zn Sulfate (EYE DROPS A/C OP) Place 1 drop into both eyes every 6 (six) hours as needed (for dry eyes). Systain     amLODipine (NORVASC) 2.5 MG tablet Take 1 tablet (2.5 mg total) by mouth in the morning and at bedtime. 180 tablet 3   No current facility-administered medications for this visit.    Allergies:   Hydrocodone and Prednisone     ROS:  Please see the  history of present illness.   Otherwise, review of systems are positive for none.   All other systems are reviewed and negative.    PHYSICAL EXAM: VS:  BP 130/64 (BP Location: Right Arm, Patient Position: Sitting, Cuff Size: Normal)   Pulse 68   Ht '5\' 3"'$  (1.6 m)   Wt 126 lb (57.2 kg)   BMI 22.32 kg/m  , BMI Body mass index is 22.32 kg/m. GENERAL:  Well appearing NECK:  No jugular venous distention, waveform within normal limits, carotid upstroke brisk and symmetric, no bruits, no thyromegaly LUNGS:  Clear to auscultation bilaterally CHEST:  Unremarkable HEART:  PMI not displaced or sustained,S1 and S2 within normal limits, no S3, no S4, no clicks, no rubs, 2 out of 6 apical systolic murmur also radiating at the aortic outflow tract, no diastolic murmurs ABD:  Flat, positive bowel sounds normal in frequency in pitch, no bruits, no rebound, no guarding, no midline pulsatile mass, no hepatomegaly, no splenomegaly EXT:  2 plus pulses throughout, no edema, no cyanosis no clubbing   EKG:  EKG is  ordered today. Sinus rhythm, rate 68, axis within normal limits, intervals within normal limits, first-degree AV block, no acute ST-T wave changes.  Premature ventricular contractions   Recent Labs: 07/10/2021: ALT 17; BNP 265.8; Hemoglobin 12.8; Platelets 223; TSH 0.966 08/06/2021: BUN 15; Creatinine, Ser 0.77; Potassium 4.7; Sodium 135    Lipid Panel    Component Value Date/Time   CHOL 157 07/04/2015 0856   TRIG 84 07/04/2015 0856   HDL 79 07/04/2015 0856   CHOLHDL 2.0 07/04/2015 0856   VLDL 17 07/04/2015 0856   LDLCALC 61 07/04/2015 0856      Wt Readings from Last 3 Encounters:  06/12/22 126 lb (57.2 kg)  12/01/21 123 lb 6.4 oz (56 kg)  09/05/21 121 lb 9.6 oz (55.2 kg)      Other studies Reviewed: Additional studies/ records that were reviewed today include:  None. Review of the above records demonstrates: See elsewhere   ASSESSMENT AND PLAN:    CAD:    The patient has no  new sypmtoms.  No further cardiovascular testing is indicated.  We will continue with aggressive risk reduction and meds as listed.  ATRIAL FIB:    [name] has a CHA2DS2 - VASc score of 5.   No change in therapy.  She tolerates anticoagulation.  She is to have normal renal function.  I have asked her to get a CBC at her next blood draw which  is going to be probably in January.  She has is followed by Dr. Forde Dandy.  HTN: Her blood pressure is at target.  No change in therapy.   MURMUR: I suspect this is related to some aortic sclerosis.  I did an echo in October which looked fine.  No further imaging.   Current medicines are reviewed at length with the patient today.  The patient does not have concerns regarding medicines.  The following changes have been made: None  Labs/ tests ordered today include:    None  Orders Placed This Encounter  Procedures   EKG 12-Lead     Disposition:   FU with me in 12 months.   Signed, Minus Breeding, MD  06/12/2022 11:28 AM    [facility]

## 2022-06-11 NOTE — Progress Notes (Signed)
06/11/2022     CHIEF COMPLAINT Patient presents for  Chief Complaint  Patient presents with   Branch Retinal Vein Occlusion   History of wet AMD OD   HISTORY OF PRESENT ILLNESS: [name] is a 86 y.o. female who presents to the clinic today for:   HPI   9 MOS FOR DILATE OU, COLOR FP, OCT. Pt stated no changes in vision since last visit. Pt is taking ELIQUIS 2.5 MG.  Last edited by Silvestre Moment on 06/11/2022 10:52 AM.      Referring physician: Reynold Bowen, MD [place],  [place] [id number]  HISTORICAL INFORMATION:   Selected notes from the [facility]    Lab Results  Component Value Date   HGBA1C 6.3 (H) 08/06/2021     CURRENT MEDICATIONS: Current Outpatient Medications (Ophthalmic Drugs)  Medication Sig   olopatadine (PATANOL) 0.1 % ophthalmic solution Place 1 drop into both eyes daily.   Tetrahydrozoline-Zn Sulfate (EYE DROPS A/C OP) Place 1 drop into both eyes every 6 (six) hours as needed (for dry eyes). Systain   No current facility-administered medications for this visit. (Ophthalmic Drugs)   Current Outpatient Medications (Other)  Medication Sig   Acetaminophen (TYLENOL PO) Take 1,000 mg by mouth 2 (two) times daily.   amLODipine (NORVASC) 2.5 MG tablet Take 1 tablet (2.5 mg total) by mouth in the morning and at bedtime.   amLODipine (NORVASC) 5 MG tablet Take 1 tablet (5 mg total) by mouth in the morning and at bedtime.   apixaban (ELIQUIS) 2.5 MG TABS tablet Take 1 tablet (2.5 mg total) by mouth 2 (two) times daily.   atorvastatin (LIPITOR) 40 MG tablet Take 1 tablet (40 mg total) by mouth daily.   Calcium Carbonate-Vitamin D (CALCIUM + D PO) Take 600 mg by mouth 2 (two) times daily. Lunch and bedtime   ELIQUIS 2.5 MG TABS tablet TAKE 1 TABLET(2.5 MG) BY MOUTH TWICE DAILY   isosorbide mononitrate (IMDUR) 30 MG 24 hr tablet Take 30 mg by mouth daily.   levothyroxine (SYNTHROID, LEVOTHROID) 88 MCG tablet Take 1 tablet (88 mcg total)  by mouth daily before breakfast.   losartan (COZAAR) 50 MG tablet Take 1 tablet (50 mg total) by mouth daily.   metoprolol succinate (TOPROL-XL) 100 MG 24 hr tablet Take 100 mg by mouth 2 (two) times daily. Take with or immediately following a meal.   Multiple Vitamins-Minerals (CENTRUM SILVER 50+WOMEN) TABS Take 1 tablet by mouth daily.   Multiple Vitamins-Minerals (OCUVITE PRESERVISION PO) Take 1 tablet by mouth 2 (two) times daily before lunch and supper.   nitroGLYCERIN (NITROSTAT) 0.4 MG SL tablet Place 1 tablet (0.4 mg total) under the tongue every 5 (five) minutes as needed.   Omega 3 1200 MG CAPS Take 1 capsule by mouth daily.   No current facility-administered medications for this visit. (Other)      REVIEW OF SYSTEMS: ROS   Negative for: Constitutional, Gastrointestinal, Neurological, Skin, Genitourinary, Musculoskeletal, HENT, Endocrine, Cardiovascular, Eyes, Respiratory, Psychiatric, Allergic/Imm, Heme/Lymph Last edited by Silvestre Moment on 06/11/2022 10:52 AM.       ALLERGIES Allergies  Allergen Reactions   Hydrocodone Other (See Comments)    "feels weird"   Prednisone Nausea And Vomiting    Oral    PAST MEDICAL HISTORY Past Medical History:  Diagnosis Date   CAD, UNSPECIFIED SITE    2009. 99% proximal RCA stenosis followed by 80% distal stenosis. The LAD has a 100% stenosis in the  midsegment. The circumflex is patent with distal occlusion. She has been managed medically. Her EF was well-preserved.   CAROTID STENOSIS    DIVERTICULAR DISEASE    Essential hypertension, benign    Gallstones 2009   Gastritis    H/O blood clots 1984   DVT and PE   Hx of degenerative disc disease    HYPERLIPIDEMIA    MACULAR DEGENERATION    OSTEOPOROSIS    Pneumonia    Rheumatoid arthritis(714.0)    Thyroid cancer ([facility]) 2008   Past Surgical History:  Procedure Laterality Date   ABDOMINAL HYSTERECTOMY  1984   APPENDECTOMY  1959   BREAST CYST EXCISION     left   CARDIOVERSION N/A  08/13/2021   Procedure: CARDIOVERSION;  Surgeon: Skeet Latch, MD;  Location: [facility] ENDOSCOPY;  Service: Cardiovascular;  Laterality: N/A;   CATARACT EXTRACTION, BILATERAL     CHOLECYSTECTOMY  2009   COLONOSCOPY     ESOPHAGOGASTRODUODENOSCOPY     IM NAILING FEMORAL SHAFT RETROGRADE  2010   left   TONSILLECTOMY     WRIST SURGERY  2004   Left    FAMILY HISTORY Family History  Problem Relation Age of Onset   Heart attack Mother    Clotting disorder Mother    Heart disease Father    Diabetes Father    CAD Sister    Stroke Sister    Lung cancer Brother    Ovarian cancer Maternal Aunt    Lung cancer Maternal Uncle    Diabetes Niece     SOCIAL HISTORY Social History   Tobacco Use   Smoking status: Never   Smokeless tobacco: Never  Vaping Use   Vaping Use: Never used  Substance Use Topics   Alcohol use: No   Drug use: No         OPHTHALMIC EXAM:  Base Eye Exam     Visual Acuity (ETDRS)       Right Left   Dist [place] 20/40 -2 20/30 -2   Dist ph [place] NI NI         Tonometry (Tonopen, 10:57 AM)       Right Left   Pressure 13 15         Pupils       Pupils Dark Light Shape React APD   Right PERRL 4 3 Round Slow None   Left PERRL 4 3 Round Slow None         Visual Fields       Left Right    Full Full         Extraocular Movement       Right Left    Full, Ortho Full, Ortho         Neuro/Psych     Oriented x3: Yes   Mood/Affect: Normal         Dilation     Both eyes: 1.0% Mydriacyl, 2.5% Phenylephrine @ 10:57 AM           Slit Lamp and Fundus Exam     External Exam       Right Left   External Normal Normal         Slit Lamp Exam       Right Left   Lids/Lashes Normal Normal   Conjunctiva/Sclera White and quiet White and quiet   Cornea Clear Clear   Anterior Chamber Deep and quiet Deep and quiet   Iris Round and reactive Round and reactive   Lens Posterior chamber  intraocular lens, Centered posterior chamber  intraocular lens Posterior chamber intraocular lens, Centered posterior chamber intraocular lens   Anterior Vitreous Normal Normal         Fundus Exam       Right Left   Posterior Vitreous Posterior vitreous detachment Posterior vitreous detachment   Disc Normal Normal   C/D Ratio 0.5 0.5   Macula no macular thickening, Drusen, Hard drusen, no hemorrhage, Geographic atrophy, regional, 1 DAs size, temporal Inferior Temporal FAZ region Microaneurysms, no macular thickening, Hard drusen   Vessels Normal old brvo inf macula   Periphery Normal Normal            IMAGING AND PROCEDURES  Imaging and Procedures for 06/11/22  OCT, Retina - OU - Both Eyes       Right Eye Quality was good. Scan locations included subfoveal. Central Foveal Thickness: 268. Progression has improved. Findings include abnormal foveal contour.   Left Eye Quality was good. Central Foveal Thickness: 354. Findings include abnormal foveal contour.   Notes Much less macular edema the right eye, old BRVO  No active maculopathy OS although some minor thickening present      Color Fundus Photography Optos - OU - Both Eyes       Right Eye Progression has been stable. Disc findings include normal observations. Macula : drusen, geographic atrophy, retinal pigment epithelium abnormalities.   Left Eye Progression has been stable. Disc findings include normal observations. Macula : drusen, microaneurysms, retinal pigment epithelium abnormalities.   Notes OD with areas of geographic atrophy, hard drusen, no active CNVM.  Posterior vitreous detachment noted incidentally.  OS, old branch retinal vein occlusion, no evidence of collateralization.  Good clear view of the              ASSESSMENT/PLAN:  Stable branch retinal vein occlusion of left eye No sign of recurrences  Intermediate stage nonexudative age-related macular degeneration of both eyes Mild OU.  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 Stable branch retinal vein occlusion of left eye  H34.8322 OCT, Retina - OU - Both Eyes    Color Fundus Photography Optos - OU - Both Eyes    2. Intermediate stage nonexudative age-related macular degeneration of both eyes  H35.3132     3. Exudative age-related macular degeneration of right eye with inactive choroidal neovascularization ([place])  H35.3212       1.  Visual acuity stable OU.  No recurrence of CME from BRVO OS  2.  No signs of reactivation of CNVM OD.  3.  Ophthalmic Meds Ordered this visit:  No orders of the defined types were placed in this encounter.      Return in about 1 year (around 06/12/2023) for COLOR FP, OCT.  There are no Patient Instructions on file for this visit.   Explained the diagnoses, plan, and follow up with the patient and they expressed understanding.  Patient expressed understanding of the importance of proper follow up care.   Clent Demark Zebbie Ace M.D. Diseases & Surgery of the Retina and Vitreous [facility] 06/11/22     Abbreviations: M myopia (nearsighted); A astigmatism; H hyperopia (farsighted); P presbyopia; Mrx spectacle prescription;  CTL contact lenses; OD right eye; OS left eye; OU both eyes  XT exotropia; ET esotropia; PEK punctate epithelial keratitis; PEE punctate epithelial erosions; DES dry eye syndrome; MGD meibomian gland dysfunction; ATs  artificial tears; PFAT's preservative free artificial tears; [place] nuclear sclerotic cataract; PSC posterior subcapsular cataract; ERM epi-retinal membrane; PVD posterior vitreous detachment; RD retinal detachment; DM diabetes mellitus; DR diabetic retinopathy; NPDR non-proliferative diabetic retinopathy; PDR proliferative diabetic retinopathy; CSME clinically significant macular edema; DME diabetic macular edema; dbh dot blot hemorrhages; CWS cotton wool spot; POAG  primary open angle glaucoma; C/D cup-to-disc ratio; HVF humphrey visual field; GVF goldmann visual field; OCT optical coherence tomography; IOP intraocular pressure; BRVO Branch retinal vein occlusion; CRVO central retinal vein occlusion; CRAO central retinal artery occlusion; BRAO branch retinal artery occlusion; RT retinal tear; SB scleral buckle; PPV pars plana vitrectomy; VH Vitreous hemorrhage; PRP panretinal laser photocoagulation; IVK intravitreal kenalog; VMT vitreomacular traction; MH Macular hole;  NVD neovascularization of the disc; NVE neovascularization elsewhere; AREDS age related eye disease study; ARMD age related macular degeneration; POAG primary open angle glaucoma; EBMD epithelial/anterior basement membrane dystrophy; ACIOL anterior chamber intraocular lens; IOL intraocular lens; PCIOL posterior chamber intraocular lens; Phaco/IOL phacoemulsification with intraocular lens placement; [place] photorefractive keratectomy; LASIK laser assisted in situ keratomileusis; HTN hypertension; DM diabetes mellitus; COPD chronic obstructive pulmonary disease

## 2022-06-11 NOTE — Assessment & Plan Note (Signed)
No sign of recurrences

## 2022-06-11 NOTE — Assessment & Plan Note (Signed)
Mild OU.

## 2022-06-12 ENCOUNTER — Ambulatory Visit: Payer: Medicare Other | Attending: Cardiology | Admitting: Cardiology

## 2022-06-12 ENCOUNTER — Encounter: Payer: Self-pay | Admitting: Cardiology

## 2022-06-12 VITALS — BP 130/64 | HR 68 | Ht 63.0 in | Wt 126.0 lb

## 2022-06-12 DIAGNOSIS — I2510 Atherosclerotic heart disease of native coronary artery without angina pectoris: Secondary | ICD-10-CM

## 2022-06-12 DIAGNOSIS — I4819 Other persistent atrial fibrillation: Secondary | ICD-10-CM | POA: Diagnosis not present

## 2022-06-12 DIAGNOSIS — I10 Essential (primary) hypertension: Secondary | ICD-10-CM

## 2022-06-12 NOTE — Patient Instructions (Signed)
Medication Instructions:  Your physician recommends that you continue on your current medications as directed. Please refer to the Current Medication list given to you today.  *If you need a refill on your cardiac medications before your next appointment, please call your pharmacy*  Follow-Up: At  [facility], you and your health needs are our priority.  As part of our continuing mission to provide you with exceptional heart care, we have created designated Provider Care Teams.  These Care Teams include your primary Cardiologist (physician) and Advanced Practice Providers (APPs -  Physician Assistants and Nurse Practitioners) who all work together to provide you with the care you need, when you need it.  We recommend signing up for the patient portal called "MyChart".  Sign up information is provided on this After Visit Summary.  MyChart is used to connect with patients for Virtual Visits (Telemedicine).  Patients are able to view lab/test results, encounter notes, upcoming appointments, etc.  Non-urgent messages can be sent to your provider as well.   To learn more about what you can do with MyChart, go to [email or web address].    Your next appointment:   12 month(s)  The format for your next appointment:   In Person  Provider:   James Hochrein, MD      

## 2022-08-19 ENCOUNTER — Other Ambulatory Visit: Payer: Self-pay | Admitting: Cardiology

## 2022-08-19 DIAGNOSIS — I4819 Other persistent atrial fibrillation: Secondary | ICD-10-CM

## 2022-08-19 NOTE — Telephone Encounter (Addendum)
Prescription refill request for Eliquis received. Indication: Afib  Last office visit: 06/12/22 (Hochrein) Scr: 0.7 (July, 2023 per PCP) Age: 86 Weight: 57.2kg   Appropriate dose and refill sent to requested pharmacy.

## 2022-09-04 DIAGNOSIS — M25552 Pain in left hip: Secondary | ICD-10-CM | POA: Diagnosis not present

## 2022-09-04 DIAGNOSIS — M545 Low back pain, unspecified: Secondary | ICD-10-CM | POA: Diagnosis not present

## 2022-09-25 ENCOUNTER — Inpatient Hospital Stay (HOSPITAL_COMMUNITY)
Admission: EM | Admit: 2022-09-25 | Discharge: 2022-09-27 | DRG: 291 | Disposition: A | Discharge status: Home / Self Care | Payer: Medicare Other | Attending: Internal Medicine | Admitting: Internal Medicine

## 2022-09-25 ENCOUNTER — Other Ambulatory Visit: Payer: Self-pay

## 2022-09-25 ENCOUNTER — Emergency Department (HOSPITAL_COMMUNITY): Payer: Medicare Other

## 2022-09-25 ENCOUNTER — Observation Stay (HOSPITAL_COMMUNITY): Payer: Medicare Other

## 2022-09-25 ENCOUNTER — Encounter (HOSPITAL_COMMUNITY): Payer: Self-pay

## 2022-09-25 DIAGNOSIS — R008 Other abnormalities of heart beat: Secondary | ICD-10-CM | POA: Diagnosis present

## 2022-09-25 DIAGNOSIS — E8770 Fluid overload, unspecified: Secondary | ICD-10-CM

## 2022-09-25 DIAGNOSIS — Z823 Family history of stroke: Secondary | ICD-10-CM | POA: Diagnosis not present

## 2022-09-25 DIAGNOSIS — Z833 Family history of diabetes mellitus: Secondary | ICD-10-CM

## 2022-09-25 DIAGNOSIS — E785 Hyperlipidemia, unspecified: Secondary | ICD-10-CM | POA: Diagnosis not present

## 2022-09-25 DIAGNOSIS — I491 Atrial premature depolarization: Secondary | ICD-10-CM | POA: Diagnosis not present

## 2022-09-25 DIAGNOSIS — Z7901 Long term (current) use of anticoagulants: Secondary | ICD-10-CM

## 2022-09-25 DIAGNOSIS — Z1152 Encounter for screening for COVID-19: Secondary | ICD-10-CM | POA: Diagnosis not present

## 2022-09-25 DIAGNOSIS — Z885 Allergy status to narcotic agent status: Secondary | ICD-10-CM | POA: Diagnosis not present

## 2022-09-25 DIAGNOSIS — Z79890 Hormone replacement therapy (postmenopausal): Secondary | ICD-10-CM

## 2022-09-25 DIAGNOSIS — I509 Heart failure, unspecified: Principal | ICD-10-CM

## 2022-09-25 DIAGNOSIS — I5033 Acute on chronic diastolic (congestive) heart failure: Secondary | ICD-10-CM | POA: Diagnosis not present

## 2022-09-25 DIAGNOSIS — I110 Hypertensive heart disease with heart failure: Secondary | ICD-10-CM | POA: Diagnosis not present

## 2022-09-25 DIAGNOSIS — M069 Rheumatoid arthritis, unspecified: Secondary | ICD-10-CM | POA: Diagnosis not present

## 2022-09-25 DIAGNOSIS — I2510 Atherosclerotic heart disease of native coronary artery without angina pectoris: Secondary | ICD-10-CM | POA: Diagnosis present

## 2022-09-25 DIAGNOSIS — I480 Paroxysmal atrial fibrillation: Secondary | ICD-10-CM | POA: Diagnosis present

## 2022-09-25 DIAGNOSIS — Z801 Family history of malignant neoplasm of trachea, bronchus and lung: Secondary | ICD-10-CM | POA: Diagnosis not present

## 2022-09-25 DIAGNOSIS — Z8041 Family history of malignant neoplasm of ovary: Secondary | ICD-10-CM | POA: Diagnosis not present

## 2022-09-25 DIAGNOSIS — R0602 Shortness of breath: Secondary | ICD-10-CM | POA: Diagnosis not present

## 2022-09-25 DIAGNOSIS — Z85850 Personal history of malignant neoplasm of thyroid: Secondary | ICD-10-CM

## 2022-09-25 DIAGNOSIS — Z8673 Personal history of transient ischemic attack (TIA), and cerebral infarction without residual deficits: Secondary | ICD-10-CM

## 2022-09-25 DIAGNOSIS — R7309 Other abnormal glucose: Secondary | ICD-10-CM

## 2022-09-25 DIAGNOSIS — Z79899 Other long term (current) drug therapy: Secondary | ICD-10-CM

## 2022-09-25 DIAGNOSIS — Z8249 Family history of ischemic heart disease and other diseases of the circulatory system: Secondary | ICD-10-CM | POA: Diagnosis not present

## 2022-09-25 DIAGNOSIS — I493 Ventricular premature depolarization: Secondary | ICD-10-CM | POA: Diagnosis present

## 2022-09-25 DIAGNOSIS — Z66 Do not resuscitate: Secondary | ICD-10-CM | POA: Diagnosis not present

## 2022-09-25 DIAGNOSIS — J9601 Acute respiratory failure with hypoxia: Secondary | ICD-10-CM | POA: Diagnosis not present

## 2022-09-25 DIAGNOSIS — I350 Nonrheumatic aortic (valve) stenosis: Secondary | ICD-10-CM | POA: Diagnosis present

## 2022-09-25 DIAGNOSIS — I5031 Acute diastolic (congestive) heart failure: Secondary | ICD-10-CM | POA: Diagnosis not present

## 2022-09-25 DIAGNOSIS — Z832 Family history of diseases of the blood and blood-forming organs and certain disorders involving the immune mechanism: Secondary | ICD-10-CM | POA: Diagnosis not present

## 2022-09-25 DIAGNOSIS — Z888 Allergy status to other drugs, medicaments and biological substances status: Secondary | ICD-10-CM

## 2022-09-25 DIAGNOSIS — E039 Hypothyroidism, unspecified: Secondary | ICD-10-CM | POA: Diagnosis not present

## 2022-09-25 DIAGNOSIS — J811 Chronic pulmonary edema: Secondary | ICD-10-CM | POA: Diagnosis not present

## 2022-09-25 DIAGNOSIS — I4891 Unspecified atrial fibrillation: Secondary | ICD-10-CM | POA: Diagnosis not present

## 2022-09-25 DIAGNOSIS — I5032 Chronic diastolic (congestive) heart failure: Secondary | ICD-10-CM | POA: Diagnosis present

## 2022-09-25 DIAGNOSIS — J90 Pleural effusion, not elsewhere classified: Secondary | ICD-10-CM | POA: Diagnosis not present

## 2022-09-25 DIAGNOSIS — I10 Essential (primary) hypertension: Secondary | ICD-10-CM | POA: Diagnosis present

## 2022-09-25 LAB — ECHOCARDIOGRAM COMPLETE
AR max vel: 0.93 cm2
AV Area VTI: 1.05 cm2
AV Area mean vel: 1.02 cm2
AV Mean grad: 11 mmHg
AV Peak grad: 21.3 mmHg
Ao pk vel: 2.31 m/s
Area-P 1/2: 3.36 cm2
Height: 63 in
MV VTI: 1.49 cm2
S' Lateral: 1.8 cm
Weight: 2016 oz

## 2022-09-25 LAB — CBC
HCT: 37.1 % (ref 36.0–46.0)
Hemoglobin: 12 g/dL (ref 12.0–15.0)
MCH: 32.1 pg (ref 26.0–34.0)
MCHC: 32.3 g/dL (ref 30.0–36.0)
MCV: 99.2 fL (ref 80.0–100.0)
Platelets: 181 10*3/uL (ref 150–400)
RBC: 3.74 MIL/uL — ABNORMAL LOW (ref 3.87–5.11)
RDW: 13.6 % (ref 11.5–15.5)
WBC: 6.1 10*3/uL (ref 4.0–10.5)
nRBC: 0 % (ref 0.0–0.2)

## 2022-09-25 LAB — RESP PANEL BY RT-PCR (RSV, FLU A&B, COVID)  RVPGX2
Influenza A by PCR: NEGATIVE
Influenza B by PCR: NEGATIVE
Resp Syncytial Virus by PCR: NEGATIVE
SARS Coronavirus 2 by RT PCR: NEGATIVE

## 2022-09-25 LAB — BASIC METABOLIC PANEL
Anion gap: 9 (ref 5–15)
BUN: 14 mg/dL (ref 8–23)
CO2: 26 mmol/L (ref 22–32)
Calcium: 9 mg/dL (ref 8.9–10.3)
Chloride: 101 mmol/L (ref 98–111)
Creatinine, Ser: 0.81 mg/dL (ref 0.44–1.00)
GFR, Estimated: >60 mL/min (ref >60)
Glucose, Bld: 117 mg/dL — ABNORMAL HIGH (ref 70–99)
Potassium: 4 mmol/L (ref 3.5–5.1)
Sodium: 136 mmol/L (ref 135–145)

## 2022-09-25 LAB — TROPONIN I (HIGH SENSITIVITY)
Troponin I (High Sensitivity): 6 ng/L (ref <18)
Troponin I (High Sensitivity): 7 ng/L (ref <18)

## 2022-09-25 LAB — BRAIN NATRIURETIC PEPTIDE: B Natriuretic Peptide: 608.2 pg/mL — ABNORMAL HIGH (ref 0.0–100.0)

## 2022-09-25 LAB — MAGNESIUM: Magnesium: 1.7 mg/dL (ref 1.7–2.4)

## 2022-09-25 LAB — TSH: TSH: 0.269 u[IU]/mL — ABNORMAL LOW (ref 0.350–4.500)

## 2022-09-25 MED ORDER — ONDANSETRON HCL 4 MG PO TABS: 4.0000 mg | ORAL_TABLET | Freq: Four times a day (QID) | ORAL | Status: DC | PRN

## 2022-09-25 MED ORDER — ACETAMINOPHEN 325 MG PO TABS: 650.0000 mg | ORAL_TABLET | Freq: Four times a day (QID) | ORAL | Status: DC | PRN

## 2022-09-25 MED ORDER — METOPROLOL SUCCINATE ER 100 MG PO TB24
100.0000 mg | ORAL_TABLET | Freq: Two times a day (BID) | ORAL | Status: DC
  Administered 2022-09-26 – 2022-09-27 (×3): 100 mg via ORAL
  Filled 2022-09-25 (×3): qty 1

## 2022-09-25 MED ORDER — FUROSEMIDE 10 MG/ML IJ SOLN
40.0000 mg | Freq: Once | INTRAMUSCULAR | Status: AC
  Administered 2022-09-25: 40 mg via INTRAVENOUS
  Filled 2022-09-25: qty 4

## 2022-09-25 MED ORDER — MAGNESIUM SULFATE 2 GM/50ML IV SOLN
2.0000 g | Freq: Once | INTRAVENOUS | Status: AC
  Administered 2022-09-25: 2 g via INTRAVENOUS
  Filled 2022-09-25: qty 50

## 2022-09-25 MED ORDER — METOPROLOL SUCCINATE ER 25 MG PO TB24
100.0000 mg | ORAL_TABLET | Freq: Two times a day (BID) | ORAL | Status: DC
  Filled 2022-09-25: qty 4

## 2022-09-25 MED ORDER — APIXABAN 2.5 MG PO TABS
2.5000 mg | ORAL_TABLET | Freq: Two times a day (BID) | ORAL | Status: DC
  Filled 2022-09-25: qty 1

## 2022-09-25 MED ORDER — METOPROLOL SUCCINATE ER 100 MG PO TB24: 200.0000 mg | ORAL_TABLET | Freq: Two times a day (BID) | ORAL | Status: DC

## 2022-09-25 MED ORDER — LOSARTAN POTASSIUM 50 MG PO TABS
100.0000 mg | ORAL_TABLET | Freq: Every day | ORAL | Status: DC
  Administered 2022-09-25 – 2022-09-27 (×3): 100 mg via ORAL
  Filled 2022-09-25 (×3): qty 2

## 2022-09-25 MED ORDER — NITROGLYCERIN IN D5W 200-5 MCG/ML-% IV SOLN
2.0000 ug/min | INTRAVENOUS | Status: DC
  Administered 2022-09-25: 3 ug/min via INTRAVENOUS
  Filled 2022-09-25: qty 250

## 2022-09-25 MED ORDER — ONDANSETRON HCL 4 MG/2ML IJ SOLN: 4.0000 mg | Freq: Four times a day (QID) | INTRAMUSCULAR | Status: DC | PRN

## 2022-09-25 MED ORDER — FUROSEMIDE 10 MG/ML IJ SOLN
40.0000 mg | Freq: Two times a day (BID) | INTRAMUSCULAR | Status: AC
  Administered 2022-09-25: 40 mg via INTRAVENOUS
  Filled 2022-09-25: qty 4

## 2022-09-25 MED ORDER — LEVOTHYROXINE SODIUM 88 MCG PO TABS: 88.0000 ug | ORAL_TABLET | Freq: Every day | ORAL | Status: DC

## 2022-09-25 MED ORDER — HEPARIN (PORCINE) 25000 UT/250ML-% IV SOLN
850.0000 [IU]/h | INTRAVENOUS | Status: AC
  Administered 2022-09-25: 850 [IU]/h via INTRAVENOUS
  Filled 2022-09-25: qty 250

## 2022-09-25 MED ORDER — POTASSIUM CHLORIDE CRYS ER 20 MEQ PO TBCR
20.0000 meq | EXTENDED_RELEASE_TABLET | Freq: Two times a day (BID) | ORAL | Status: DC
  Administered 2022-09-25 (×2): 20 meq via ORAL
  Filled 2022-09-25 (×2): qty 1

## 2022-09-25 MED ORDER — ISOSORBIDE MONONITRATE ER 30 MG PO TB24
30.0000 mg | ORAL_TABLET | Freq: Every day | ORAL | Status: DC
  Administered 2022-09-25 – 2022-09-27 (×3): 30 mg via ORAL
  Filled 2022-09-25 (×3): qty 1

## 2022-09-25 MED ORDER — MAGNESIUM SULFATE 2 GM/50ML IV SOLN: 2.0000 g | Freq: Once | INTRAVENOUS | Status: DC

## 2022-09-25 MED ORDER — NITROGLYCERIN 2 % TD OINT
1.0000 [in_us] | TOPICAL_OINTMENT | Freq: Once | TRANSDERMAL | Status: AC
  Administered 2022-09-25: 1 [in_us] via TOPICAL
  Filled 2022-09-25 (×2): qty 1

## 2022-09-25 MED ORDER — ATORVASTATIN CALCIUM 40 MG PO TABS
40.0000 mg | ORAL_TABLET | Freq: Every day | ORAL | Status: DC
  Administered 2022-09-25 – 2022-09-27 (×3): 40 mg via ORAL
  Filled 2022-09-25 (×3): qty 1

## 2022-09-25 MED ORDER — ACETAMINOPHEN 650 MG RE SUPP: 650.0000 mg | Freq: Four times a day (QID) | RECTAL | Status: DC | PRN

## 2022-09-25 MED ORDER — APIXABAN 2.5 MG PO TABS
2.5000 mg | ORAL_TABLET | Freq: Two times a day (BID) | ORAL | Status: DC
  Administered 2022-09-25 – 2022-09-27 (×4): 2.5 mg via ORAL
  Filled 2022-09-25 (×4): qty 1

## 2022-09-25 NOTE — ED Triage Notes (Signed)
Pt to ED via [facility] from home. Pt c/o shortness of breath x 2 weeks ago, worsening since yesterday evening. HR=45, PVCs initially. Hx of afib. Pt has had diarrhea, tested for covid earlier today that was negative. 18g LAC, 546m NS.  Pt A&Ox4, labored breathing in triage.   95% RA, placed on 2LNC Cbg=161 98% 2LNC 156/116

## 2022-09-25 NOTE — Progress Notes (Signed)
Patient claimed she doesn't usually take '200mg'$  Metoprolol. She only had Metoprolol '100mg'$  BID. Notified the provider regarding patient's concern.

## 2022-09-25 NOTE — Assessment & Plan Note (Signed)
Verified pt's DNR/DNI status with pt and her husband Gene. Pt verifies that she would not want to be intubated for respiratory failure. Nor would be want CPR/ACLS for cardiac arrest.

## 2022-09-25 NOTE — Progress Notes (Signed)
PROGRESS NOTE    [name]  WIO:[id number] DOB: 02/23/1936 DOA: 09/25/2022 PCP: Reynold Bowen, MD  Outpatient Specialists:     Brief Narrative:  As per H&P done earlier: "86 yo WF with history of paroxysmal atrial fibrillation on Eliquis, coronary disease, hypertension, hypothyroidism, dyslipidemia, no prior history of CHF, presents to the ER today with a 2-week history of worsening shortness of breath and dyspnea exertion.  Patient denies any chest pain.  She has noted some worsening lower extremity edema.  She sleeps normally in a recliner.  She never lays down flat.  Denies any fever or chills.  Does not wear self on a regular basis.  Patient has noted increasing dyspnea even with performing ADLs.  This is verified by the patient's husband.  Patient does not take any daily or as needed diuretics.   On arrival to the ER, temp 97 heart rate 76 blood pressure 152/105 satting initially 95% on room air.  Patient had labored breathing in the ER.   Labs showed an elevated BNP of 608   Hemoglobin of 12   BUN of 14, creatinine 0.8   Chest x-ray showed some central pulmonary congestion but no overt pulmonary edema.   EKG showed bigeminy   As the patient's ER evaluation continued, she was noted to have increasing work of breathing to the point where she had respiratory to dress.  She was placed on BiPAP due to accessory muscle use.   EDP consulted cardiology and requested cardiology admission.  On-call cardiology fellow declined admission to the cardiac service.   [facility] contacted for admission.    ED Course: BNP 600s, increasing SOB and work of breathing. Started on bipap"  09/25/2022: Patient was seen alongside patient's husband.  According to the patient, she developed progressive shortness of breath over the last 2 weeks with associated leg edema.  Patient is currently being managed likely CHF and uncontrolled hypertension.  Echocardiogram revealed: 1. Left  ventricular ejection fraction, by estimation, is 60 to 65%. The  left ventricle has normal function. The left ventricle has no regional  wall motion abnormalities. Left ventricular diastolic parameters are  consistent with Grade II diastolic  dysfunction (pseudonormalization).   2. Right ventricular systolic function is normal. The right ventricular  size is normal. There is normal pulmonary artery systolic pressure.   3. Right atrial size was mildly dilated.   4. The mitral valve is grossly normal. Trivial mitral valve  regurgitation. No evidence of mitral stenosis.   5. The aortic valve is calcified. Aortic valve regurgitation is not  visualized. Mild aortic valve stenosis. Aortic valve area, by VTI measures  1.05 cm. Aortic valve mean gradient measures 11.0 mmHg. Aortic valve Vmax  measures 2.31 m/s.   6. The inferior vena cava is normal in size with greater than 50%  respiratory variability, suggesting right atrial pressure of 3 mmHg.      Assessment & Plan:   Principal Problem:   Acute respiratory failure with hypoxia (HCC) Active Problems:   Essential hypertension, benign   Hypothyroidism   Coronary artery disease involving native coronary artery of native heart without angina pectoris   Dyslipidemia   PAF (paroxysmal atrial fibrillation) (HCC)   Hypervolemia   DNR (do not resuscitate)/DNI(Do Not Intubate)   Acute on chronic diastolic CHF (congestive heart failure) (HCC)   Acute on chronic diastolic CHF/acute respiratory failure with hypoxia ([place]) -Respiratory failure is likely secondary to CHF exacerbation. -Cardiology input is appreciated. -Continue diuresis. -Patient is off  BiPAP. -Shortness of breath have improved significantly. -Cardiology team is directing CHF management.     Hypervolemia -Secondary to CHF.     PAF (paroxysmal atrial fibrillation) (HCC) -Continue anticoagulation and beta-blocker. -Heart rate is currently controlled.      Dyslipidemia Continue lipitor 40 mg   Coronary artery disease involving native coronary artery of native heart without angina pectoris Continue lipitor 40 mg daily and toprol-xl 100 mg bid.   Hypothyroidism TSH is 0.269.   Low threshold to decrease Synthroid from 88 mcg p.o. once daily to 75 mcg p.o. once daily.     Essential hypertension, benign -Blood pressure control is gradually improving. -Goal blood pressure should be less than 130/80 mmHg if tolerated.     DVT prophylaxis: Eliquis Code Status: DNR/DNI. Family Communication: Husband Disposition Plan: This will depend on hospital course   Consultants:  Cardiology  Procedures:  Echocardiogram (as documented above).  Antimicrobials:  None   Subjective: Shortness of breath is improving.  Objective: Vitals:   09/25/22 1200 09/25/22 1315 09/25/22 1430 09/25/22 1435  BP: (!) 124/55 119/68 128/60   Pulse: 66 66 70   Resp: 14 (!) 27 (!) 22   Temp:    98.3 F (36.8 C)  TempSrc:    Oral  SpO2: 96% 97% 97%   Weight:      Height:        Intake/Output Summary (Last 24 hours) at 09/25/2022 1555 Last data filed at 09/25/2022 9735 Gross per 24 hour  Intake 45.94 ml  Output --  Net 45.94 ml   Filed Weights   09/25/22 0125  Weight: 57.2 kg    Examination:  General exam: Appears calm and comfortable  Respiratory system: Decreased air entry.    Cardiovascular system: S1 & S2 with ejection systolic murmur.   Gastrointestinal system: Abdomen is soft and nontender.   Central nervous system: Alert and oriented.  Patient moves all extremities.   Extremities: Mild edema.  Data Reviewed: I have personally reviewed following labs and imaging studies  CBC: Recent Labs  Lab 09/25/22 0137  WBC 6.1  HGB 12.0  HCT 37.1  MCV 99.2  PLT 329   Basic Metabolic Panel: Recent Labs  Lab 09/25/22 0137  NA 136  K 4.0  CL 101  CO2 26  GLUCOSE 117*  BUN 14  CREATININE 0.81  CALCIUM 9.0  MG 1.7    GFR: Estimated Creatinine Clearance: 42.8 mL/min (by C-G formula based on SCr of 0.81 mg/dL). Liver Function Tests: No results for input(s): "AST", "ALT", "ALKPHOS", "BILITOT", "PROT", "ALBUMIN" in the last 168 hours. No results for input(s): "LIPASE", "AMYLASE" in the last 168 hours. No results for input(s): "AMMONIA" in the last 168 hours. Coagulation Profile: No results for input(s): "INR", "PROTIME" in the last 168 hours. Cardiac Enzymes: No results for input(s): "CKTOTAL", "CKMB", "CKMBINDEX", "TROPONINI" in the last 168 hours. BNP (last 3 results) No results for input(s): "PROBNP" in the last 8760 hours. HbA1C: No results for input(s): "HGBA1C" in the last 72 hours. CBG: No results for input(s): "GLUCAP" in the last 168 hours. Lipid Profile: No results for input(s): "CHOL", "HDL", "LDLCALC", "TRIG", "CHOLHDL", "LDLDIRECT" in the last 72 hours. Thyroid Function Tests: Recent Labs    09/25/22 0319  TSH 0.269*   Anemia Panel: No results for input(s): "VITAMINB12", "FOLATE", "FERRITIN", "TIBC", "IRON", "RETICCTPCT" in the last 72 hours. Urine analysis:    Component Value Date/Time   COLORURINE YELLOW 11/05/2013 [place] 11/05/2013 1332  LABSPEC 1.011 11/05/2013 1332   PHURINE 7.0 11/05/2013 1332   GLUCOSEU NEGATIVE 11/05/2013 1332   HGBUR SMALL (A) 11/05/2013 1332   BILIRUBINUR NEGATIVE 11/05/2013 1332   KETONESUR 15 (A) 11/05/2013 1332   PROTEINUR 30 (A) 11/05/2013 1332   UROBILINOGEN 0.2 11/05/2013 1332   NITRITE NEGATIVE 11/05/2013 1332   LEUKOCYTESUR NEGATIVE 11/05/2013 1332   Sepsis Labs: '@LABRCNTIP'$ (procalcitonin:4,lacticidven:4)  ) Recent Results (from the past 240 hour(s))  Resp panel by RT-PCR (RSV, Flu A&B, Covid) Anterior Nasal Swab     Status: None   Collection Time: 09/25/22  1:26 AM   Specimen: Anterior Nasal Swab  Result Value Ref Range Status   SARS Coronavirus 2 by RT PCR NEGATIVE NEGATIVE Final    Comment: (NOTE) SARS-CoV-2  target nucleic acids are NOT DETECTED.  The SARS-CoV-2 RNA is generally detectable in upper respiratory specimens during the acute phase of infection. The lowest concentration of SARS-CoV-2 viral copies this assay can detect is 138 copies/mL. A negative result does not preclude SARS-Cov-2 infection and should not be used as the sole basis for treatment or other patient management decisions. A negative result may occur with  improper specimen collection/handling, submission of specimen other than nasopharyngeal swab, presence of viral mutation(s) within the areas targeted by this assay, and inadequate number of viral copies(<138 copies/mL). A negative result must be combined with clinical observations, patient history, and epidemiological information. The expected result is Negative.  Fact Sheet for Patients:  EntrepreneurPulse.com.au  Fact Sheet for Healthcare Providers:  IncredibleEmployment.be  This test is no t yet approved or cleared by the [place] FDA and  has been authorized for detection and/or diagnosis of SARS-CoV-2 by FDA under an Emergency Use Authorization (EUA). This EUA will remain  in effect (meaning this test can be used) for the duration of the COVID-19 declaration under Section 564(b)(1) of the Act, 21 U.S.C.section 360bbb-3(b)(1), unless the authorization is terminated  or revoked sooner.       Influenza A by PCR NEGATIVE NEGATIVE Final   Influenza B by PCR NEGATIVE NEGATIVE Final    Comment: (NOTE) The Xpert Xpress SARS-CoV-2/FLU/RSV plus assay is intended as an aid in the diagnosis of influenza from Nasopharyngeal swab specimens and should not be used as a sole basis for treatment. Nasal washings and aspirates are unacceptable for Xpert Xpress SARS-CoV-2/FLU/RSV testing.  Fact Sheet for Patients: EntrepreneurPulse.com.au  Fact Sheet for Healthcare  Providers: IncredibleEmployment.be  This test is not yet approved or cleared by the [place] FDA and has been authorized for detection and/or diagnosis of SARS-CoV-2 by FDA under an Emergency Use Authorization (EUA). This EUA will remain in effect (meaning this test can be used) for the duration of the COVID-19 declaration under Section 564(b)(1) of the Act, 21 U.S.C. section 360bbb-3(b)(1), unless the authorization is terminated or revoked.     Resp Syncytial Virus by PCR NEGATIVE NEGATIVE Final    Comment: (NOTE) Fact Sheet for Patients: EntrepreneurPulse.com.au  Fact Sheet for Healthcare Providers: IncredibleEmployment.be  This test is not yet approved or cleared by the [place] FDA and has been authorized for detection and/or diagnosis of SARS-CoV-2 by FDA under an Emergency Use Authorization (EUA). This EUA will remain in effect (meaning this test can be used) for the duration of the COVID-19 declaration under Section 564(b)(1) of the Act, 21 U.S.C. section 360bbb-3(b)(1), unless the authorization is terminated or revoked.  Performed at [facility], [place] [address] [place],  [address]  Radiology Studies: ECHOCARDIOGRAM COMPLETE  Result Date: 09/25/2022    ECHOCARDIOGRAM REPORT   Patient Name:   [name] Date of Exam: 09/25/2022 Medical Rec #:  [id number]       Height:       63.0 in Accession #:    [id number]      Weight:       126.0 lb Date of Birth:  1936/04/26      BSA:          1.589 m Patient Age:    4 years        BP:           125/70 mmHg Patient Gender: F               HR:           69 bpm. Exam Location:  Inpatient Procedure: 2D Echo, Cardiac Doppler and Color Doppler Indications:    Hypervolemia [[id number]]  History:        Patient has prior history of Echocardiogram examinations, most                 recent 07/22/2021. CHF, CAD, Carotid Disease; Risk                  Factors:Hypertension and Dyslipidemia.  Sonographer:    Darlina Sicilian RDCS Referring Phys: [place]  1. Left ventricular ejection fraction, by estimation, is 60 to 65%. The left ventricle has normal function. The left ventricle has no regional wall motion abnormalities. Left ventricular diastolic parameters are consistent with Grade II diastolic dysfunction (pseudonormalization).  2. Right ventricular systolic function is normal. The right ventricular size is normal. There is normal pulmonary artery systolic pressure.  3. Right atrial size was mildly dilated.  4. The mitral valve is grossly normal. Trivial mitral valve regurgitation. No evidence of mitral stenosis.  5. The aortic valve is calcified. Aortic valve regurgitation is not visualized. Mild aortic valve stenosis. Aortic valve area, by VTI measures 1.05 cm. Aortic valve mean gradient measures 11.0 mmHg. Aortic valve Vmax measures 2.31 m/s.  6. The inferior vena cava is normal in size with greater than 50% respiratory variability, suggesting right atrial pressure of 3 mmHg. Comparison(s): No significant change from prior study. FINDINGS  Left Ventricle: Left ventricular ejection fraction, by estimation, is 60 to 65%. The left ventricle has normal function. The left ventricle has no regional wall motion abnormalities. The left ventricular internal cavity size was normal in size. There is  no left ventricular hypertrophy. Left ventricular diastolic parameters are consistent with Grade II diastolic dysfunction (pseudonormalization). Right Ventricle: The right ventricular size is normal. No increase in right ventricular wall thickness. Right ventricular systolic function is normal. There is normal pulmonary artery systolic pressure. The tricuspid regurgitant velocity is 2.57 m/s, and  with an assumed right atrial pressure of 3 mmHg, the estimated right ventricular systolic pressure is 67.5 mmHg. Left Atrium: Left atrial size was normal in  size. Right Atrium: Right atrial size was mildly dilated. Pericardium: Trivial pericardial effusion is present. The pericardial effusion is circumferential. Mitral Valve: The mitral valve is grossly normal. Trivial mitral valve regurgitation. No evidence of mitral valve stenosis. MV peak gradient, 6.1 mmHg. The mean mitral valve gradient is 2.0 mmHg. Tricuspid Valve: The tricuspid valve is grossly normal. Tricuspid valve regurgitation is not demonstrated. No evidence of tricuspid stenosis. Aortic Valve: The aortic valve is calcified. Aortic valve regurgitation is not visualized. Mild aortic stenosis is present.  Aortic valve mean gradient measures 11.0 mmHg. Aortic valve peak gradient measures 21.3 mmHg. Aortic valve area, by VTI measures 1.05 cm. Pulmonic Valve: The pulmonic valve was grossly normal. Pulmonic valve regurgitation is mild. No evidence of pulmonic stenosis. Aorta: The aortic root and ascending aorta are structurally normal, with no evidence of dilitation. Venous: The inferior vena cava is normal in size with greater than 50% respiratory variability, suggesting right atrial pressure of 3 mmHg. IAS/Shunts: No atrial level shunt detected by color flow Doppler.  LEFT VENTRICLE PLAX 2D LVIDd:         3.90 cm   Diastology LVIDs:         1.80 cm   LV e' medial:    8.27 cm/s LV PW:         1.80 cm   LV E/e' medial:  14.1 LV IVS:        0.90 cm   LV e' lateral:   9.85 cm/s LVOT diam:     1.60 cm   LV E/e' lateral: 11.9 LV SV:         51 LV SV Index:   32 LVOT Area:     2.01 cm  RIGHT VENTRICLE RV S prime:     15.40 cm/s TAPSE (M-mode): 2.4 cm LEFT ATRIUM             Index        RIGHT ATRIUM           Index LA diam:        3.40 cm 2.14 cm/m   RA Area:     19.40 cm LA Vol (A2C):   38.8 ml 24.42 ml/m  RA Volume:   47.00 ml  29.58 ml/m LA Vol (A4C):   34.3 ml 21.59 ml/m LA Biplane Vol: 36.5 ml 22.97 ml/m  AORTIC VALVE AV Area (Vmax):    0.93 cm AV Area (Vmean):   1.02 cm AV Area (VTI):     1.05 cm AV  Vmax:           230.67 cm/s AV Vmean:          151.000 cm/s AV VTI:            0.484 m AV Peak Grad:      21.3 mmHg AV Mean Grad:      11.0 mmHg LVOT Vmax:         107.00 cm/s LVOT Vmean:        76.600 cm/s LVOT VTI:          0.252 m LVOT/AV VTI ratio: 0.52  AORTA Ao Root diam: 2.70 cm MITRAL VALVE                TRICUSPID VALVE MV Area (PHT): 3.36 cm     TR Peak grad:   26.4 mmHg MV Area VTI:   1.49 cm     TR Vmax:        257.00 cm/s MV Peak grad:  6.1 mmHg MV Mean grad:  2.0 mmHg     SHUNTS MV Vmax:       1.23 m/s     Systemic VTI:  0.25 m MV Vmean:      63.5 cm/s    Systemic Diam: 1.60 cm MV Decel Time: 226 msec MV E velocity: 117.00 cm/s MV A velocity: 56.10 cm/s MV E/A ratio:  2.09 Landscape architect signed by Phineas Inches Signature Date/Time: 09/25/2022/1:48:17 PM    Final    DG Chest  2 View  Result Date: 09/25/2022 CLINICAL DATA:  Shortness of breath EXAM: CHEST - 2 VIEW COMPARISON:  Chest x-ray 09/25/2018. chest CT 09/25/2018. FINDINGS: Heart is enlarged, unchanged. There central pulmonary vascular congestion. There is a small right pleural effusion. There is no pneumothorax or focal lung infiltrate. There are calcifications in the bilateral breast, unchanged. No acute fractures are seen. IMPRESSION: Cardiomegaly with central pulmonary vascular congestion and small right pleural effusion. Electronically Signed   By: Ronney Asters M.D.   On: 09/25/2022 02:02        Scheduled Meds:  atorvastatin  40 mg Oral Daily   furosemide  40 mg Intravenous Q12H   isosorbide mononitrate  30 mg Oral Daily   levothyroxine  88 mcg Oral QAC breakfast   losartan  100 mg Oral Daily   metoprolol succinate  200 mg Oral BID   potassium chloride  20 mEq Oral BID   Continuous Infusions:  heparin 850 Units/hr (09/25/22 1520)   nitroGLYCERIN 3 mcg/min (09/25/22 0643)     LOS: 0 days    Time spent: 55 minutes.    Dana Allan, MD  [facility] Pager #: [phone] 7PM-7AM contact  night coverage as above

## 2022-09-25 NOTE — ED Notes (Signed)
Roxanne Mins MD called to bedside due to pts increased WOB. New orders placed

## 2022-09-25 NOTE — Assessment & Plan Note (Signed)
Continue lipitor 40mg  

## 2022-09-25 NOTE — ED Notes (Signed)
Patient resting in bed, no s/s of distress, family present at bedside, will continue to monitor.  

## 2022-09-25 NOTE — Assessment & Plan Note (Signed)
Continue with toprol-xl. Hold cozaar to allow more room for diuresis without impairing renal function. Restart ARB once more euvolemic. Also hold ARB in case pt would need entresto.

## 2022-09-25 NOTE — Assessment & Plan Note (Addendum)
No prior hx of CHF. Continue with IV lasix 40 mg q12h. Pt states she has felt like this before prior to her need cardioversion last year for afib. Pt currently in bigeminy and not in afib. Check echo. Check tsh. EDP consulted with on-cardiology who declined admission.

## 2022-09-25 NOTE — Assessment & Plan Note (Signed)
Check TSH. Continue synthroid 88 mcg daily.

## 2022-09-25 NOTE — H&P (Signed)
History and Physical    [name] AOZ:[id number] DOB: 1935/12/22 DOA: 09/25/2022  DOS: the patient was seen and examined on 09/25/2022  PCP: Reynold Bowen, MD   Patient coming from: Home  I have personally briefly reviewed patient's old medical records in [place]  CC: worsening SOB HPI: 86 yo WF with history of paroxysmal atrial fibrillation on Eliquis, coronary disease, hypertension, hypothyroidism, dyslipidemia, no prior history of CHF, presents to the ER today with a 2-week history of worsening shortness of breath and dyspnea exertion.  Patient denies any chest pain.  She has noted some worsening lower extremity edema.  She sleeps normally in a recliner.  She never lays down flat.  Denies any fever or chills.  Does not wear self on a regular basis.  Patient has noted increasing dyspnea even with performing ADLs.  This is verified by the patient's husband.  Patient does not take any daily or as needed diuretics.  On arrival to the ER, temp 97 heart rate 76 blood pressure 152/105 satting initially 95% on room air.  Patient had labored breathing in the ER.  Labs showed an elevated BNP of 608  Hemoglobin of 12  BUN of 14, creatinine 0.8  Chest x-ray showed some central pulmonary congestion but no overt pulmonary edema.  EKG showed bigeminy  As the patient's ER evaluation continued, she was noted to have increasing work of breathing to the point where she had respiratory to dress.  She was placed on BiPAP due to accessory muscle use.  EDP consulted cardiology and requested cardiology admission.  On-call cardiology fellow declined admission to the cardiac service.  [facility] contacted for admission.   ED Course: BNP 600s, increasing SOB and work of breathing. Started on bipap  Review of Systems:  Review of Systems  Constitutional: Negative.   HENT: Negative.    Eyes: Negative.   Respiratory:  Positive for shortness of breath. Negative for cough.    Cardiovascular:  Positive for leg swelling. Negative for chest pain.       +DOE Denies chest pain  Gastrointestinal: Negative.   Genitourinary: Negative.   Musculoskeletal: Negative.   Skin: Negative.   Neurological: Negative.   Endo/Heme/Allergies: Negative.   Psychiatric/Behavioral: Negative.    All other systems reviewed and are negative.   Past Medical History:  Diagnosis Date   CAD, UNSPECIFIED SITE    2009. 99% proximal RCA stenosis followed by 80% distal stenosis. The LAD has a 100% stenosis in the midsegment. The circumflex is patent with distal occlusion. She has been managed medically. Her EF was well-preserved.   CAROTID STENOSIS    DIVERTICULAR DISEASE    Elevated troponin- 0.58 in setting of PSVT 10/29/2013   Essential hypertension, benign    Gallstones 2009   Gastritis    H/O blood clots 1984   DVT and PE   Hx of degenerative disc disease    HYPERLIPIDEMIA    MACULAR DEGENERATION    OSTEOPOROSIS    Pneumonia    Rheumatoid arthritis(714.0)    Thyroid cancer ([place]) 2008    Past Surgical History:  Procedure Laterality Date   ABDOMINAL HYSTERECTOMY  1984   APPENDECTOMY  1959   BREAST CYST EXCISION     left   CARDIOVERSION N/A 08/13/2021   Procedure: CARDIOVERSION;  Surgeon: Skeet [place], MD;  Location: [facility];  Service: Cardiovascular;  Laterality: N/A;   CATARACT EXTRACTION, BILATERAL     CHOLECYSTECTOMY  2009   COLONOSCOPY  ESOPHAGOGASTRODUODENOSCOPY     IM NAILING FEMORAL SHAFT RETROGRADE  2010   left   TONSILLECTOMY     WRIST SURGERY  2004   Left     reports that she has never smoked. She has never used smokeless tobacco. She reports that she does not drink alcohol and does not use drugs.  Allergies  Allergen Reactions   Hydrocodone Other (See Comments)    "feels weird"   Prednisone Nausea And Vomiting    Oral    Family History  Problem Relation Age of Onset   Heart attack Mother    Clotting disorder Mother    Heart  disease Father    Diabetes Father    CAD Sister    Stroke Sister    Lung cancer Brother    Ovarian cancer Maternal Aunt    Lung cancer Maternal Uncle    Diabetes Niece     Prior to Admission medications   Medication Sig Start Date End Date Taking? Authorizing Provider  ELIQUIS 2.5 MG TABS tablet TAKE 1 TABLET(2.5 MG) BY MOUTH TWICE DAILY 08/19/22   Minus Breeding, MD  Acetaminophen (TYLENOL PO) Take 1,000 mg by mouth 2 (two) times daily.    [provider]  amLODipine (NORVASC) 2.5 MG tablet Take 1 tablet (2.5 mg total) by mouth in the morning and at bedtime. 12/01/21 03/01/22  Minus Breeding, MD  amLODipine (NORVASC) 5 MG tablet Take 1 tablet (5 mg total) by mouth in the morning and at bedtime. 12/01/21   Minus Breeding, MD  atorvastatin (LIPITOR) 40 MG tablet Take 1 tablet (40 mg total) by mouth daily. 09/05/21   Lenna Sciara, NP  Calcium Carbonate-Vitamin D (CALCIUM + D PO) Take 600 mg by mouth 2 (two) times daily. Lunch and bedtime    [provider]  isosorbide mononitrate (IMDUR) 30 MG 24 hr tablet Take 30 mg by mouth daily.    [provider]  levothyroxine (SYNTHROID, LEVOTHROID) 88 MCG tablet Take 1 tablet (88 mcg total) by mouth daily before breakfast. 11/02/13   Reynold Bowen, MD  losartan (COZAAR) 50 MG tablet Take 1 tablet (50 mg total) by mouth daily. 07/22/21   Minus Breeding, MD  metoprolol succinate (TOPROL-XL) 100 MG 24 hr tablet Take 100 mg by mouth 2 (two) times daily. Take with or immediately following a meal.    [provider]  Multiple Vitamins-Minerals (CENTRUM SILVER 50+WOMEN) TABS Take 1 tablet by mouth daily.    [provider]  Multiple Vitamins-Minerals (OCUVITE PRESERVISION PO) Take 1 tablet by mouth 2 (two) times daily before lunch and supper.    [provider]  nitroGLYCERIN (NITROSTAT) 0.4 MG SL tablet Place 1 tablet (0.4 mg total) under the tongue every 5 (five) minutes as needed. 05/26/12   Minus Breeding, MD  olopatadine (PATANOL) 0.1 % ophthalmic solution Place 1 drop into both eyes daily.    [provider]  Omega 3 1200 MG CAPS Take 1 capsule by mouth daily.    [provider]  Tetrahydrozoline-Zn Sulfate (EYE DROPS A/C OP) Place 1 drop into both eyes every 6 (six) hours as needed (for dry eyes). Systain    [provider]    Physical Exam: Vitals:   09/25/22 0215 09/25/22 0230 09/25/22 0245 09/25/22 0306  BP: (!) 162/107 137/63 (!) 131/110   Pulse: 74 70 91 77  Resp: (!) 28 17 (!) 30 (!) 24  Temp:      TempSrc:  SpO2: 98% 98% 98% 98%  Weight:      Height:        Physical Exam Vitals and nursing note reviewed.  Constitutional:      Appearance: Normal appearance.  HENT:     Head: Normocephalic and atraumatic.     Nose: Nose normal.  Cardiovascular:     Rate and Rhythm: Normal rate. Rhythm irregular.     Pulses: Normal pulses.  Pulmonary:     Comments: On bipap Basilar rales. dry Abdominal:     General: Abdomen is flat. Bowel sounds are normal. There is no distension.     Palpations: Abdomen is soft.     Tenderness: There is no abdominal tenderness.  Musculoskeletal:     Right lower leg: Swelling present. 1+ Pitting Edema present.     Left lower leg: Swelling present. 1+ Pitting Edema present.  Skin:    General: Skin is warm and dry.     Capillary Refill: Capillary refill takes less than 2 seconds.  Neurological:     General: No focal deficit present.     Mental Status: She is alert and oriented to person, place, and time.      Labs on Admission: I have personally reviewed following labs and imaging studies  CBC: Recent Labs  Lab 09/25/22 0137  WBC 6.1  HGB 12.0  HCT 37.1  MCV 99.2  PLT 790   Basic Metabolic Panel: Recent Labs  Lab 09/25/22 0137  NA 136  K 4.0  CL 101  CO2 26  GLUCOSE 117*  BUN 14  CREATININE 0.81  CALCIUM 9.0  MG 1.7   GFR: Estimated Creatinine Clearance: 42.8 mL/min (by C-G formula  based on SCr of 0.81 mg/dL). Liver Function Tests: No results for input(s): "AST", "ALT", "ALKPHOS", "BILITOT", "PROT", "ALBUMIN" in the last 168 hours. No results for input(s): "LIPASE", "AMYLASE" in the last 168 hours. No results for input(s): "AMMONIA" in the last 168 hours. Coagulation Profile: No results for input(s): "INR", "PROTIME" in the last 168 hours. Cardiac Enzymes: Recent Labs  Lab 09/25/22 0137  TROPONINIHS 6   BNP (last 3 results) No results for input(s): "PROBNP" in the last 8760 hours. HbA1C: No results for input(s): "HGBA1C" in the last 72 hours. CBG: No results for input(s): "GLUCAP" in the last 168 hours. Lipid Profile: No results for input(s): "CHOL", "HDL", "LDLCALC", "TRIG", "CHOLHDL", "LDLDIRECT" in the last 72 hours. Thyroid Function Tests: No results for input(s): "TSH", "T4TOTAL", "FREET4", "T3FREE", "THYROIDAB" in the last 72 hours. Anemia Panel: No results for input(s): "VITAMINB12", "FOLATE", "FERRITIN", "TIBC", "IRON", "RETICCTPCT" in the last 72 hours. Urine analysis:    Component Value Date/Time   COLORURINE YELLOW 11/05/2013 1332   APPEARANCEUR CLEAR 11/05/2013 1332   LABSPEC 1.011 11/05/2013 1332   PHURINE 7.0 11/05/2013 1332   GLUCOSEU NEGATIVE 11/05/2013 1332   HGBUR SMALL (A) 11/05/2013 1332   BILIRUBINUR NEGATIVE 11/05/2013 1332   KETONESUR 15 (A) 11/05/2013 1332   PROTEINUR 30 (A) 11/05/2013 1332   UROBILINOGEN 0.2 11/05/2013 1332   NITRITE NEGATIVE 11/05/2013 1332   LEUKOCYTESUR NEGATIVE 11/05/2013 1332    Radiological Exams on Admission: I have personally reviewed images DG Chest 2 View  Result Date: 09/25/2022 CLINICAL DATA:  Shortness of breath EXAM: CHEST - 2 VIEW COMPARISON:  Chest x-ray 09/25/2018. chest CT 09/25/2018. FINDINGS: Heart is enlarged, unchanged. There central pulmonary vascular congestion. There is a small right pleural effusion. There is no pneumothorax or focal lung infiltrate. There are calcifications in the  bilateral breast, unchanged. No acute fractures are seen. IMPRESSION: Cardiomegaly with central pulmonary vascular congestion and small right pleural effusion. Electronically Signed   By: Ronney Asters M.D.   On: 09/25/2022 02:02    EKG: My personal interpretation of EKG shows: bigeminy    Assessment/Plan Principal Problem:   Acute respiratory failure with hypoxia (HCC) Active Problems:   Hypervolemia   Essential hypertension, benign   Hypothyroidism   Coronary artery disease involving native coronary artery of native heart without angina pectoris   Dyslipidemia   PAF (paroxysmal atrial fibrillation) ([place])   DNR (do not resuscitate)/DNI(Do Not Intubate)    Assessment and Plan: * Acute respiratory failure with hypoxia ([place]) Admit to progressive care. Continue with Bipap. Wean to RA as tolerated. Keep npo while on bipap.  Hypervolemia No prior hx of CHF. Continue with IV lasix 40 mg q12h. Pt states she has felt like this before prior to her need cardioversion last year for afib. Pt currently in bigeminy and not in afib. Check echo. Check tsh. EDP consulted with on-cardiology who declined admission.  PAF (paroxysmal atrial fibrillation) (HCC) Currently in bigeminy. Continue with Eliquis 2.5 mg bid. Keep serum K > 4.0 and serum Mg >2.0  Dyslipidemia Continue lipitor 40 mg  Coronary artery disease involving native coronary artery of native heart without angina pectoris Continue lipitor 40 mg daily and toprol-xl 100 mg bid.  Hypothyroidism Check TSH. Continue synthroid 88 mcg daily.  Essential hypertension, benign Continue with toprol-xl. Hold cozaar to allow more room for diuresis without impairing renal function. Restart ARB once more euvolemic. Also hold ARB in case pt would need entresto.  DNR (do not resuscitate)/DNI(Do Not Intubate) Verified pt's DNR/DNI status with pt and her husband Gene. Pt verifies that she would not want to be intubated for respiratory failure. Nor  would be want CPR/ACLS for cardiac arrest.   DVT prophylaxis: Eliquis Code Status: DNR/DNI(Do NOT Intubate) Family Communication: discussed with pt and husband Gene  Disposition Plan: return home  Consults called: EDP consulted cardiology who declined admission.  Admission status: Observation, Telemetry bed   Kristopher Oppenheim, DO [facility] 09/25/2022, 3:46 AM

## 2022-09-25 NOTE — Progress Notes (Signed)
  Echocardiogram 2D Echocardiogram has been performed.  Darlina Sicilian M 09/25/2022, 1:35 PM

## 2022-09-25 NOTE — ED Provider Notes (Signed)
[facility] EMERGENCY DEPARTMENT Provider Note   CSN: [id number] Arrival date & time: 09/25/22  0120     History  Chief Complaint  Patient presents with   Shortness of Breath    [name] is a 86 y.o. female.  The history is provided by the patient.  Shortness of Breath She has history of hypertension, hyperlipidemia, coronary artery disease, atrial fibrillation anticoagulated on apixaban, rheumatoid and comes in because of shortness of breath for the last 2 weeks which got worse over the last 2 days.  Breathing is worse when she is laying down.  She has also noted some swelling in her legs over the same time period.  She denies chest pain, heaviness, tightness, pressure.  She did have a cough yesterday, but none today.  She denies fever or chills.   Home Medications Prior to Admission medications   Medication Sig Start Date End Date Taking? Authorizing Provider  ELIQUIS 2.5 MG TABS tablet TAKE 1 TABLET(2.5 MG) BY MOUTH TWICE DAILY 08/19/22   Minus Breeding, MD  Acetaminophen (TYLENOL PO) Take 1,000 mg by mouth 2 (two) times daily.    [provider]  amLODipine (NORVASC) 2.5 MG tablet Take 1 tablet (2.5 mg total) by mouth in the morning and at bedtime. 12/01/21 03/01/22  Minus Breeding, MD  amLODipine (NORVASC) 5 MG tablet Take 1 tablet (5 mg total) by mouth in the morning and at bedtime. 12/01/21   Minus Breeding, MD  atorvastatin (LIPITOR) 40 MG tablet Take 1 tablet (40 mg total) by mouth daily. 09/05/21   Lenna Sciara, NP  Calcium Carbonate-Vitamin D (CALCIUM + D PO) Take 600 mg by mouth 2 (two) times daily. Lunch and bedtime    [provider]  isosorbide mononitrate (IMDUR) 30 MG 24 hr tablet Take 30 mg by mouth daily.    [provider]  levothyroxine (SYNTHROID, LEVOTHROID) 88 MCG tablet Take 1 tablet (88 mcg total) by mouth daily before breakfast. 11/02/13   Reynold Bowen, MD  losartan (COZAAR) 50 MG tablet Take 1 tablet  (50 mg total) by mouth daily. 07/22/21   Minus Breeding, MD  metoprolol succinate (TOPROL-XL) 100 MG 24 hr tablet Take 100 mg by mouth 2 (two) times daily. Take with or immediately following a meal.    [provider]  Multiple Vitamins-Minerals (CENTRUM SILVER 50+WOMEN) TABS Take 1 tablet by mouth daily.    [provider]  Multiple Vitamins-Minerals (OCUVITE PRESERVISION PO) Take 1 tablet by mouth 2 (two) times daily before lunch and supper.    [provider]  nitroGLYCERIN (NITROSTAT) 0.4 MG SL tablet Place 1 tablet (0.4 mg total) under the tongue every 5 (five) minutes as needed. 05/26/12   Minus Breeding, MD  olopatadine (PATANOL) 0.1 % ophthalmic solution Place 1 drop into both eyes daily.    [provider]  Omega 3 1200 MG CAPS Take 1 capsule by mouth daily.    [provider]  Tetrahydrozoline-Zn Sulfate (EYE DROPS A/C OP) Place 1 drop into both eyes every 6 (six) hours as needed (for dry eyes). Systain    [provider]      Allergies    Hydrocodone and Prednisone    Review of Systems   Review of Systems  Respiratory:  Positive for shortness of breath.   All other systems reviewed and are negative.   Physical Exam Updated Vital Signs BP (!) 152/105 (BP Location: Left Arm)   Pulse 76   Temp 97.7  F (36.5 C) (Oral)   Resp 19   Ht '5\' 3"'$  (1.6 m)   Wt 57.2 kg   SpO2 98%   BMI 22.32 kg/m  Physical Exam Vitals and nursing note reviewed.   86 year old female, resting comfortably and in no acute distress. Vital signs are significant for elevated blood pressure. Oxygen saturation is 98%, which is normal. Head is normocephalic and atraumatic. PERRLA, EOMI. Oropharynx is clear. Neck is nontender and supple without adenopathy.  Borderline JVD is present. Back is nontender and there is no CVA tenderness.  There is 1+ presacral edema Lungs fine rales about halfway up.  There were no wheezes or rhonchi. Chest is  nontender. Heart has regular rate and rhythm without murmur. Abdomen is soft, flat, nontender. Extremities have 1+ edema, full range of motion is present.  Moderate venous stasis changes are present. Skin is warm and dry without rash. Neurologic: Mental status is normal, cranial nerves are intact, moves all extremities equally.  ED Results / Procedures / Treatments   Labs (all labs ordered are listed, but only abnormal results are displayed) Labs Reviewed  BASIC METABOLIC PANEL - Abnormal; Notable for the following components:      Result Value   Glucose, Bld 117 (*)    All other components within normal limits  CBC - Abnormal; Notable for the following components:   RBC 3.74 (*)    All other components within normal limits  BRAIN NATRIURETIC PEPTIDE - Abnormal; Notable for the following components:   B Natriuretic Peptide 608.2 (*)    All other components within normal limits  RESP PANEL BY RT-PCR (RSV, FLU A&B, COVID)  RVPGX2  MAGNESIUM  TROPONIN I (HIGH SENSITIVITY)    EKG EKG Interpretation  Date/Time:  Friday September 25 2022 01:24:16 EST Ventricular Rate:  92 PR Interval:  222 QRS Duration: 70 QT Interval:  358 QTC Calculation: 442 R Axis:   67 Text Interpretation: Sinus rhythm with 1st degree A-V block with frequent Premature ventricular complexes in a pattern of bigeminy Nonspecific ST abnormality Abnormal ECG When compared with ECG of 13-Aug-2021 13:18, Premature ventricular complexes are now present Confirmed by Delora Fuel ([id number]) on 09/25/2022 1:43:33 AM  Radiology DG Chest 2 View  Result Date: 09/25/2022 CLINICAL DATA:  Shortness of breath EXAM: CHEST - 2 VIEW COMPARISON:  Chest x-ray 09/25/2018. chest CT 09/25/2018. FINDINGS: Heart is enlarged, unchanged. There central pulmonary vascular congestion. There is a small right pleural effusion. There is no pneumothorax or focal lung infiltrate. There are calcifications in the bilateral breast, unchanged. No acute  fractures are seen. IMPRESSION: Cardiomegaly with central pulmonary vascular congestion and small right pleural effusion. Electronically Signed   By: Ronney Asters M.D.   On: 09/25/2022 02:02    Procedures Procedures  Cardiac monitor shows ventricular bigeminy, per my interpretation.  Medications Ordered in ED Medications - No data to display  ED Course/ Medical Decision Making/ A&P                           Medical Decision Making Amount and/or Complexity of Data Reviewed Labs: ordered.  Risk Prescription drug management. Decision regarding hospitalization.   Shortness of breath which seems to be a heart failure exacerbation.  Doubt pulmonary embolism in the setting of chronic anticoagulation.  No fever to suggest pneumonia.  I have reviewed and interpreted her electrocardiogram, and my interpretation is ventricular bigeminy which is new compared with prior.  Her cardiac  monitor also shows ventricular bigeminy.  Chest x-ray shows cardiomegaly with pulmonary vascular congestion but not significantly changed from prior.  I have independently viewed the images, and agree with radiologist's interpretation.  I suspect that her ventricular bigeminy is a major reason for her heart failure exacerbation since half of her ventricular contractions are not effective.  I have reviewed her past records, and she had cardioversion on 08/13/2021.  Echocardiogram on 07/22/2021 showed ejection fraction of 60-65%, unable to assess diastolic parameters.  2:54 AM Patient is feeling more dyspneic.  She is using accessory muscles of respiration.  I reviewed and interpreted her laboratory test, and my interpretation is normal electrolytes, mildly elevated random glucose, normal troponin, moderately elevated BNP, normal CBC.  At this point, I am concerned about patient tiring so I am starting her on BiPAP.  I have ordered furosemide intravenously and topical nitroglycerin.  Since this is a somewhat complicated case, I  am consulting cardiology to consider admitting on their service.  Case was discussed with Dr. Harrell Gave who does not believe that the deterioration is related to her ventricular bigeminy and states that she should be admitted under medicine service but is willing to see the patient in consultation if requested.  I have discussed case with Dr. Vallarie Mare of [facility], who agrees to admit the patient.   CRITICAL CARE Performed by: Delora Fuel Total critical care time: 55 minutes Critical care time was exclusive of separately billable procedures and treating other patients. Critical care was necessary to treat or prevent imminent or life-threatening deterioration. Critical care was time spent personally by me on the following activities: development of treatment plan with patient and/or surrogate as well as nursing, discussions with consultants, evaluation of patient's response to treatment, examination of patient, obtaining history from patient or surrogate, ordering and performing treatments and interventions, ordering and review of laboratory studies, ordering and review of radiographic studies, pulse oximetry and re-evaluation of patient's condition.  Final Clinical Impression(s) / ED Diagnoses Final diagnoses:  Acute heart failure, unspecified heart failure type ([place])  Elevated random blood glucose level    Rx / DC Orders ED Discharge Orders     None         Delora Fuel, MD 00/71/21 [phone]

## 2022-09-25 NOTE — Subjective & Objective (Signed)
CC: worsening SOB HPI: 86 yo WF with history of paroxysmal atrial fibrillation on Eliquis, coronary disease, hypertension, hypothyroidism, dyslipidemia, no prior history of CHF, presents to the ER today with a 2-week history of worsening shortness of breath and dyspnea exertion.  Patient denies any chest pain.  She has noted some worsening lower extremity edema.  She sleeps normally in a recliner.  She never lays down flat.  Denies any fever or chills.  Does not wear self on a regular basis.  Patient has noted increasing dyspnea even with performing ADLs.  This is verified by the patient's husband.  Patient does not take any daily or as needed diuretics.  On arrival to the ER, temp 97 heart rate 76 blood pressure 152/105 satting initially 95% on room air.  Patient had labored breathing in the ER.  Labs showed an elevated BNP of 608  Hemoglobin of 12  BUN of 14, creatinine 0.8  Chest x-ray showed some central pulmonary congestion but no overt pulmonary edema.  EKG showed bigeminy  As the patient's ER evaluation continued, she was noted to have increasing work of breathing to the point where she had respiratory to dress.  She was placed on BiPAP due to accessory muscle use.  EDP consulted cardiology and requested cardiology admission.  On-call cardiology fellow declined admission to the cardiac service.  [facility] contacted for admission.

## 2022-09-25 NOTE — Consult Note (Addendum)
Cardiology Consultation   Patient ID: [name] MRN: [id number]; DOB: 03/17/1936  Admit date: 09/25/2022 Date of Consult: 09/25/2022  PCP:  Reynold Bowen, [place] [facility] Cardiologist:  Minus Breeding, MD        Patient Profile:   [name] is a 86 y.o. female with a hx of significant 3-vessel CAD, including CTO of both LCx as well as LAD, with 99% complex and long RCA stenosis, all of which is being managed medically/conservatively, in addition to paroxysmal Afib, who is being seen 09/25/2022 for the evaluation of volume overload at the request of Dr. Roxanne Mins [facility]).  History of Present Illness:   [name] has the above history. She is followed in the cardiology clinic and was last seen Sept 2023 by Dr. Percival Spanish; she was doing well at that time. She presented to the ED today c/o worsening SOB/DOE and edema over the past couple days. She had BP of 156/116 by EMS, with VBG and HR in 80s (although pulse ox gave spurious reading of HR in 40s due to VBG). Pt's husband says she has felt bad primarily over the past day or two, and has been very SOB with minimal exertion. She does not keep track of her weights, and is not on any standing diuretics at home. No h/o LV systolic dysfunction (last echo in Oct 2022 showed normal LVEF).   Past Medical History:  Diagnosis Date   CAD, UNSPECIFIED SITE    2009. 99% proximal RCA stenosis followed by 80% distal stenosis. The LAD has a 100% stenosis in the midsegment. The circumflex is patent with distal occlusion. She has been managed medically. Her EF was well-preserved.   CAROTID STENOSIS    DIVERTICULAR DISEASE    Essential hypertension, benign    Gallstones 2009   Gastritis    H/O blood clots 1984   DVT and PE   Hx of degenerative disc disease    HYPERLIPIDEMIA    MACULAR DEGENERATION    OSTEOPOROSIS    Pneumonia    Rheumatoid arthritis(714.0)    Thyroid cancer ([place]) 2008    Past Surgical  History:  Procedure Laterality Date   ABDOMINAL HYSTERECTOMY  1984   APPENDECTOMY  1959   BREAST CYST EXCISION     left   CARDIOVERSION N/A 08/13/2021   Procedure: CARDIOVERSION;  Surgeon: Skeet Latch, MD;  Location[facility] ENDOSCOPY;  Service: Cardiovascular;  Laterality: N/A;   CATARACT EXTRACTION, BILATERAL     CHOLECYSTECTOMY  2009   COLONOSCOPY     ESOPHAGOGASTRODUODENOSCOPY     IM NAILING FEMORAL SHAFT RETROGRADE  2010   left   TONSILLECTOMY     WRIST SURGERY  2004   Left     Home Medications:  Prior to Admission medications   Medication Sig Start Date End Date Taking? Authorizing Provider  ELIQUIS 2.5 MG TABS tablet TAKE 1 TABLET(2.5 MG) BY MOUTH TWICE DAILY 08/19/22   Minus Breeding, MD  Acetaminophen (TYLENOL PO) Take 1,000 mg by mouth 2 (two) times daily.    [provider]  amLODipine (NORVASC) 2.5 MG tablet Take 1 tablet (2.5 mg total) by mouth in the morning and at bedtime. 12/01/21 03/01/22  Minus Breeding, MD  amLODipine (NORVASC) 5 MG tablet Take 1 tablet (5 mg total) by mouth in the morning and at bedtime. 12/01/21   Minus Breeding, MD  atorvastatin (LIPITOR) 40 MG tablet Take 1 tablet (40 mg total) by mouth daily. 09/05/21   Diona Browner  C, NP  Calcium Carbonate-Vitamin D (CALCIUM + D PO) Take 600 mg by mouth 2 (two) times daily. Lunch and bedtime    [provider]  isosorbide mononitrate (IMDUR) 30 MG 24 hr tablet Take 30 mg by mouth daily.    [provider]  levothyroxine (SYNTHROID, LEVOTHROID) 88 MCG tablet Take 1 tablet (88 mcg total) by mouth daily before breakfast. 11/02/13   Reynold Bowen, MD  losartan (COZAAR) 50 MG tablet Take 1 tablet (50 mg total) by mouth daily. 07/22/21   Minus Breeding, MD  metoprolol succinate (TOPROL-XL) 100 MG 24 hr tablet Take 100 mg by mouth 2 (two) times daily. Take with or immediately following a meal.    [provider]  Multiple Vitamins-Minerals (CENTRUM SILVER 50+WOMEN) TABS Take 1  tablet by mouth daily.    [provider]  Multiple Vitamins-Minerals (OCUVITE PRESERVISION PO) Take 1 tablet by mouth 2 (two) times daily before lunch and supper.    [provider]  nitroGLYCERIN (NITROSTAT) 0.4 MG SL tablet Place 1 tablet (0.4 mg total) under the tongue every 5 (five) minutes as needed. 05/26/12   Minus Breeding, MD  olopatadine (PATANOL) 0.1 % ophthalmic solution Place 1 drop into both eyes daily.    [provider]  Omega 3 1200 MG CAPS Take 1 capsule by mouth daily.    [provider]  Tetrahydrozoline-Zn Sulfate (EYE DROPS A/C OP) Place 1 drop into both eyes every 6 (six) hours as needed (for dry eyes). Systain    [provider]    Inpatient Medications: Scheduled Meds:  Continuous Infusions:  PRN Meds:   Allergies:    Allergies  Allergen Reactions   Hydrocodone Other (See Comments)    "feels weird"   Prednisone Nausea And Vomiting    Oral    Social History:   Social History   Socioeconomic History   Marital status: Married    Spouse name: Not on file   Number of children: 1   Years of education: Not on file   Highest education level: Not on file  Occupational History   Occupation: retired    Comment: Network engineer  Tobacco Use   Smoking status: Never   Smokeless tobacco: Never  Vaping Use   Vaping Use: Never used  Substance and Sexual Activity   Alcohol use: No   Drug use: No   Sexual activity: Not on file  Other Topics Concern   Not on file  Social History Narrative   Married, retired1 daughter   Right handed   1 story   Social Determinants of Health   Financial Resource Strain: Not on file  Food Insecurity: Not on file  Transportation Needs: Not on file  Physical Activity: Not on file  Stress: Not on file  Social Connections: Not on file  Intimate Partner Violence: Not on file    Family History:    Family History  Problem Relation Age of Onset   Heart attack Mother    Clotting  disorder Mother    Heart disease Father    Diabetes Father    CAD Sister    Stroke Sister    Lung cancer Brother    Ovarian cancer Maternal Aunt    Lung cancer Maternal Uncle    Diabetes Niece      ROS:  Please see the history of present illness.   All other ROS reviewed and negative.     Physical Exam/Data:   Vitals:   09/25/22 0215 09/25/22 0230  09/25/22 0245 09/25/22 0306  BP: (!) 162/107 137/63 (!) 131/110   Pulse: 74 70 91 77  Resp: (!) 28 17 (!) 30 (!) 24  Temp:      TempSrc:      SpO2: 98% 98% 98% 98%  Weight:      Height:       No intake or output data in the 24 hours ending 09/25/22 0319    09/25/2022    1:25 AM 06/12/2022   10:38 AM 12/01/2021    8:15 AM  Last 3 Weights  Weight (lbs) 126 lb 126 lb 123 lb 6.4 oz  Weight (kg) 57.153 kg 57.153 kg 55.974 kg     Body mass index is 22.32 kg/m.  General:  Well nourished, well developed, mildly increased work of breathing HEENT: normal Neck: no JVD Vascular: No carotid bruits; Distal pulses 1+ bilaterally Cardiac:  normal S1, S2; RRR with frequent ectopy; no murmur  Lungs:  clear to auscultation bilaterally, no wheezing, rhonchi or rales  Abd: soft, nontender, no hepatomegaly  Ext: tr bilateral edema Musculoskeletal:  No deformities Skin: warm and dry  Neuro:  CNs 2-12 intact Psych:  Normal affect   EKG:  The EKG was personally reviewed and demonstrates:  EKG is poor in quality with significant artifact in the baseline, but does demonstrate frequent PVC Telemetry:  Telemetry was personally reviewed and demonstrates:  NSR with frequent PVC  Relevant CV Studies: LHC pre-2010 Right coronary artery was dominant.  There was long 99% stenosis starting in the proximal RCA.  There was an 80% stenosis in the more distal RCA.  There was extensive left-to-right collaterals.  The left main was normal.  Left circumflex had 100% distal vessel.  There were left-to-left collaterals filling the distal vessel. The LAD was  occluded in the mid segment.  Distal LAD filled via left-to- left collaterals from obtuse marginal to circumflex.  There were 2 patent diagonals.  There was 50% stenosis in the second diagonal and 50% proximal LAD stenosis.  TTE 07-22-21 1. Left ventricular ejection fraction, by estimation, is 60 to 65%. The  left ventricle has normal function. The left ventricle has no regional  wall motion abnormalities. Left ventricular diastolic function could not  be evaluated (Afib)   2. Right ventricular systolic function is normal. The right ventricular  size is normal. There is mildly elevated pulmonary artery systolic  pressure. The estimated right ventricular systolic pressure is 11.6 mmHg.   3. The mitral valve is grossly normal. Mild mitral valve regurgitation.   4. The aortic valve is tricuspid. There is mild calcification of the  aortic valve. There is mild thickening of the aortic valve. Aortic valve  regurgitation is not visualized. Mild aortic valve sclerosis is present,  with no evidence of aortic valve  stenosis.   5. The inferior vena cava is normal in size with greater than 50%  respiratory variability, suggesting right atrial pressure of 3 mmHg.   Laboratory Data:  High Sensitivity Troponin:   Recent Labs  Lab 09/25/22 0137  TROPONINIHS 6     Chemistry Recent Labs  Lab 09/25/22 0137  NA 136  K 4.0  CL 101  CO2 26  GLUCOSE 117*  BUN 14  CREATININE 0.81  CALCIUM 9.0  MG 1.7  GFRNONAA >60  ANIONGAP 9    No results for input(s): "PROT", "ALBUMIN", "AST", "ALT", "ALKPHOS", "BILITOT" in the last 168 hours. Lipids No results for input(s): "CHOL", "TRIG", "HDL", "LABVLDL", "LDLCALC", "CHOLHDL" in the  last 168 hours.  Hematology Recent Labs  Lab 09/25/22 0137  WBC 6.1  RBC 3.74*  HGB 12.0  HCT 37.1  MCV 99.2  MCH 32.1  MCHC 32.3  RDW 13.6  PLT 181   Thyroid No results for input(s): "TSH", "FREET4" in the last 168 hours.  BNP Recent Labs  Lab  09/25/22 0137  BNP 608.2*    DDimer No results for input(s): "DDIMER" in the last 168 hours.   Radiology/Studies:  DG Chest 2 View  Result Date: 09/25/2022 CLINICAL DATA:  Shortness of breath EXAM: CHEST - 2 VIEW COMPARISON:  Chest x-ray 09/25/2018. chest CT 09/25/2018. FINDINGS: Heart is enlarged, unchanged. There central pulmonary vascular congestion. There is a small right pleural effusion. There is no pneumothorax or focal lung infiltrate. There are calcifications in the bilateral breast, unchanged. No acute fractures are seen. IMPRESSION: Cardiomegaly with central pulmonary vascular congestion and small right pleural effusion. Electronically Signed   By: Ronney Asters M.D.   On: 09/25/2022 02:02     Assessment and Plan:   Volume overload: sounds almost like flash pulm edema, with very high BP and sudden-onset pulm congestion. CXR does not show significant pulm edema; pt has only trace LE edema. Unclear whether there is significant LV dysfunction. Last echo showed normal EF, indeterminate diastolic function. Pt does have h/o very significant 3-vessel CAD, and it's possible that has caused progressive weakening of LV systolic function with resultant low LVEF. It's also possible she has had increased burden of AF +/- inadequate rate control that may have led to LV dysfunction. Pt essentially needs repeat TTE to reassess LVEF; EF will guide subsequent management and possible med changes. In the interim, would cont IV diuresis. Will change topical NTG to IV NTG given persistently elevated BP and to help w/ pulmonary congestion. HS trop is negative. 2. Frequent PVC: this may be driven by underlying ischemia (pt has known severe 3VD being medically managed), +/- LV dysfunction. K is fine but Mag is low. Will give 2gIV Magnesium. Keep K>4, Mag>2. Needs repeat TTE as above. If EF is down, would consider repeating LHC to reassess for any lesions possibly amenable to revascularization. Will avoid BB in  the acute setting until pt better compensated from volume standpoint. 3. CAD: severe 3vCAD, medically managed. See above assessments 4. AF: pt currently in NSR with frequent PVC. May ultimately need ambulatory monitor to reassess AF burden.   Risk Assessment/Risk Scores:        New York Heart Association (NYHA) Functional Class NYHA Class III  CHA2DS2-VASc Score = 7   This indicates a 11.2% annual risk of stroke. The patient's score is based upon: CHF History: 0 HTN History: 1 Diabetes History: 0 Stroke History: 2 (Pt had TIA documented in Epic in 2015, afib not dx unti 07/2021) Vascular Disease History: 1 Age Score: 2 Gender Score: 1         For questions or updates, please contact [facility] Please consult [email or web address] for contact info under    Signed, Rudean Curt, MD, [facility] 09/25/2022 3:19 AM   Personally seen and examined. Agree with my partner in the early AM above with the following comments:  Briefly this is a lovely patient with a history of  CAD (CTO LAD and LCX) and complex RCA disease, with PAF seen for acute onset SOB.   Husband notes that her SOB is much improved. Her LE swelling is much better. She has no chest pain.   She  has may concerns about her diuresis as she has has symptomatic hyponatremia in the past.  Soft systolic murmur, no LE edema. BP variable but 130/90 at last eval Weaned to 1 L through exam.  Tele: SR  Would recommend  - Agree with Echo - presently no plan for cath; will get echo today heparin instead of DOAC, unless dramatic decrease in LVEF I would favor medical management because I am unclear of procedural targets - lasix 40 IV with additional dose this PM; no dose ordered for 09/26/22 as she has a hx of significant hyponatermia - needing minimal nitro; I have increased her losartan dose - will feed cardiac diet - increase BB dose with frequent PVCs  Rudean Haskell, MD [facility]  [address] [place], [place] [address] [phone]  10:13 AM

## 2022-09-25 NOTE — Progress Notes (Addendum)
ANTICOAGULATION CONSULT NOTE - Follow Up Consult  Pharmacy Consult for Heparin infusion Indication: atrial fibrillation  Allergies  Allergen Reactions   Hydrocodone Other (See Comments)    "feels weird"   Prednisone Nausea And Vomiting    Oral    Patient Measurements: Height: '5\' 3"'$  (160 cm) Weight: 57.2 kg (126 lb) IBW/kg (Calculated) : 52.4 Heparin Dosing Weight: 57.2 kg  Vital Signs: Temp: 98.3 F (36.8 C) (12/22 1435) Temp Source: Oral (12/22 1435) BP: 130/74 (12/22 1600) Pulse Rate: 60 (12/22 1600)  Labs: Recent Labs    09/25/22 0137 09/25/22 0319  HGB 12.0  --   HCT 37.1  --   PLT 181  --   CREATININE 0.81  --   TROPONINIHS 6 7     Estimated Creatinine Clearance: 42.8 mL/min (by C-G formula based on SCr of 0.81 mg/dL).   Medical History: Past Medical History:  Diagnosis Date   CAD, UNSPECIFIED SITE    2009. 99% proximal RCA stenosis followed by 80% distal stenosis. The LAD has a 100% stenosis in the midsegment. The circumflex is patent with distal occlusion. She has been managed medically. Her EF was well-preserved.   CAROTID STENOSIS    DIVERTICULAR DISEASE    Elevated troponin- 0.58 in setting of PSVT 10/29/2013   Essential hypertension, benign    Gallstones 2009   Gastritis    H/O blood clots 1984   DVT and PE   Hx of degenerative disc disease    HYPERLIPIDEMIA    MACULAR DEGENERATION    OSTEOPOROSIS    Pneumonia    Rheumatoid arthritis(714.0)    Thyroid cancer ([place]) 2008    Medications:  (Not in a hospital admission)  Assessment: 86 yo F BIB EMS for SOB for past 2 weeks, worsening overnight. Pt has history of AFib and takes apixaban 2.'5mg'$  BID at home. Last dose was taken at 17:00 on 09/24/22. Pt admitted with acute respiratory failure with hypoxia and is requiring BiPAP for oxygenation. Pharmacy consulted to dose and manage heparin infusion for AFib while pt NPO on BiPAP.   Consult for transition back to home Eliqius tonight.   Goal of  Therapy:  Heparin level 0.3-0.7 units/ml aPTT 66-102 seconds Monitor platelets by anticoagulation protocol: Yes   Plan:  Will transition heparin to Eliquis 2.'5mg'$  BID at 22:00.  Appropriate stop times ordered.   Esmeralda Arthur, PharmD, BCCCP  Clinical Pharmacist 09/25/2022 4:31 PM   Please refer to AMION for pharmacy phone number

## 2022-09-25 NOTE — Assessment & Plan Note (Signed)
Admit to progressive care. Continue with Bipap. Wean to RA as tolerated. Keep npo while on bipap.

## 2022-09-25 NOTE — Progress Notes (Signed)
ANTICOAGULATION CONSULT NOTE - Initial Consult  Pharmacy Consult for Heparin infusion Indication: atrial fibrillation  Allergies  Allergen Reactions   Hydrocodone Other (See Comments)    "feels weird"   Prednisone Nausea And Vomiting    Oral    Patient Measurements: Height: '5\' 3"'$  (160 cm) Weight: 57.2 kg (126 lb) IBW/kg (Calculated) : 52.4 Heparin Dosing Weight: 57.2 kg  Vital Signs: Temp: 98.1 F (36.7 C) (12/22 0640) Temp Source: Oral (12/22 0640) BP: 112/65 (12/22 1000) Pulse Rate: 69 (12/22 1000)  Labs: Recent Labs    09/25/22 0137 09/25/22 0319  HGB 12.0  --   HCT 37.1  --   PLT 181  --   CREATININE 0.81  --   TROPONINIHS 6 7    Estimated Creatinine Clearance: 42.8 mL/min (by C-G formula based on SCr of 0.81 mg/dL).   Medical History: Past Medical History:  Diagnosis Date   CAD, UNSPECIFIED SITE    2009. 99% proximal RCA stenosis followed by 80% distal stenosis. The LAD has a 100% stenosis in the midsegment. The circumflex is patent with distal occlusion. She has been managed medically. Her EF was well-preserved.   CAROTID STENOSIS    DIVERTICULAR DISEASE    Elevated troponin- 0.58 in setting of PSVT 10/29/2013   Essential hypertension, benign    Gallstones 2009   Gastritis    H/O blood clots 1984   DVT and PE   Hx of degenerative disc disease    HYPERLIPIDEMIA    MACULAR DEGENERATION    OSTEOPOROSIS    Pneumonia    Rheumatoid arthritis(714.0)    Thyroid cancer ([place]) 2008    Medications:  (Not in a hospital admission)   Assessment: 86 yo F BIB EMS for SOB for past 2 weeks, worsening overnight. Pt has history of AFib and takes apixaban 2.'5mg'$  BID at home. Last dose was taken at 17:00 on 09/24/22. Pt admitted with acute respiratory failure with hypoxia and is requiring BiPAP for oxygenation. Pharmacy consulted to dose and manage heparin infusion for AFib while pt NPO on BiPAP.   Hgb 12, Plt 181 - stable No s/sx of bleeding  Goal of Therapy:   Heparin level 0.3-0.7 units/ml aPTT 66-102 seconds Monitor platelets by anticoagulation protocol: Yes   Plan:  Initiate heparin infusion at 850 units/hr. No heparin bolus given recent apixaban administration on 09/24/22.  Check aPTT and heparin level in 8 hours and repeat until therapeutic x2 Monitor daily CBC, aPTT and heparin levels (until correlating due to recent apixaban administration), and for s/sx of bleeding.  Luisa Hart, PharmD, BCPS Clinical Pharmacist 09/25/2022 11:52 AM   Please refer to [facility] for pharmacy phone number

## 2022-09-25 NOTE — Assessment & Plan Note (Addendum)
Continue lipitor 40 mg daily and toprol-xl 100 mg bid.

## 2022-09-25 NOTE — Assessment & Plan Note (Signed)
Currently in bigeminy. Continue with Eliquis 2.5 mg bid. Keep serum K > 4.0 and serum Mg >2.0

## 2022-09-25 NOTE — ED Provider Triage Note (Signed)
  Emergency Medicine Provider Triage Evaluation Note  MRN:  [id number]  Arrival date & time: 09/25/22    Medically screening exam initiated at 1:25 AM.   CC:   Shortness of Breath   HPI:  [name] is a 86 y.o. year-old female presents to the ED with chief complaint of shortness of breath for the past 2 weeks.  States that symptoms have worsened over the past few days.  Denies cough, but has been coughing during history.  Denies fever.  History provided by patient. ROS:  -As included in HPI PE:   Vitals:   09/25/22 0125  BP: (!) 152/105  Pulse: (!) 43  Resp: 19  Temp: 97.7 F (36.5 C)  SpO2: 99%    Non-toxic appearing No respiratory distress Crackles low MDM:  Here with SOB.  Will check labs and imaging. I've ordered labs and imaging in triage to expedite lab/diagnostic workup.  Patient was informed that the remainder of the evaluation will be completed by another provider, this initial triage assessment does not replace that evaluation, and the importance of remaining in the ED until their evaluation is complete.    Montine Circle, PA-C 09/25/22 0127

## 2022-09-26 DIAGNOSIS — I5031 Acute diastolic (congestive) heart failure: Secondary | ICD-10-CM

## 2022-09-26 DIAGNOSIS — J9601 Acute respiratory failure with hypoxia: Secondary | ICD-10-CM | POA: Diagnosis not present

## 2022-09-26 LAB — CBC WITH DIFFERENTIAL/PLATELET
Abs Immature Granulocytes: 0.02 10*3/uL (ref 0.00–0.07)
Basophils Absolute: 0 10*3/uL (ref 0.0–0.1)
Basophils Relative: 1 %
Eosinophils Absolute: 0.1 10*3/uL (ref 0.0–0.5)
Eosinophils Relative: 1 %
HCT: 40 % (ref 36.0–46.0)
Hemoglobin: 12.9 g/dL (ref 12.0–15.0)
Immature Granulocytes: 0 %
Lymphocytes Relative: 13 %
Lymphs Abs: 0.7 10*3/uL (ref 0.7–4.0)
MCH: 31.5 pg (ref 26.0–34.0)
MCHC: 32.3 g/dL (ref 30.0–36.0)
MCV: 97.6 fL (ref 80.0–100.0)
Monocytes Absolute: 0.7 10*3/uL (ref 0.1–1.0)
Monocytes Relative: 13 %
Neutro Abs: 4 10*3/uL (ref 1.7–7.7)
Neutrophils Relative %: 72 %
Platelets: 194 10*3/uL (ref 150–400)
RBC: 4.1 MIL/uL (ref 3.87–5.11)
RDW: 13.3 % (ref 11.5–15.5)
WBC: 5.5 10*3/uL (ref 4.0–10.5)
nRBC: 0 % (ref 0.0–0.2)

## 2022-09-26 LAB — RENAL FUNCTION PANEL
Albumin: 3.7 g/dL (ref 3.5–5.0)
Anion gap: 11 (ref 5–15)
BUN: 22 mg/dL (ref 8–23)
CO2: 29 mmol/L (ref 22–32)
Calcium: 8.4 mg/dL — ABNORMAL LOW (ref 8.9–10.3)
Chloride: 94 mmol/L — ABNORMAL LOW (ref 98–111)
Creatinine, Ser: 0.95 mg/dL (ref 0.44–1.00)
GFR, Estimated: 59 mL/min — ABNORMAL LOW (ref >60)
Glucose, Bld: 109 mg/dL — ABNORMAL HIGH (ref 70–99)
Phosphorus: 3.6 mg/dL (ref 2.5–4.6)
Potassium: 4.3 mmol/L (ref 3.5–5.1)
Sodium: 134 mmol/L — ABNORMAL LOW (ref 135–145)

## 2022-09-26 LAB — COMPREHENSIVE METABOLIC PANEL
ALT: 19 U/L (ref 0–44)
AST: 36 U/L (ref 15–41)
Albumin: 4 g/dL (ref 3.5–5.0)
Alkaline Phosphatase: 57 U/L (ref 38–126)
Anion gap: 11 (ref 5–15)
BUN: 17 mg/dL (ref 8–23)
CO2: 30 mmol/L (ref 22–32)
Calcium: 8.7 mg/dL — ABNORMAL LOW (ref 8.9–10.3)
Chloride: 96 mmol/L — ABNORMAL LOW (ref 98–111)
Creatinine, Ser: 0.93 mg/dL (ref 0.44–1.00)
GFR, Estimated: >60 mL/min (ref >60)
Glucose, Bld: 101 mg/dL — ABNORMAL HIGH (ref 70–99)
Potassium: 5.6 mmol/L — ABNORMAL HIGH (ref 3.5–5.1)
Sodium: 137 mmol/L (ref 135–145)
Total Bilirubin: 1.7 mg/dL — ABNORMAL HIGH (ref 0.3–1.2)
Total Protein: 8 g/dL (ref 6.5–8.1)

## 2022-09-26 LAB — MAGNESIUM: Magnesium: 1.9 mg/dL (ref 1.7–2.4)

## 2022-09-26 MED ORDER — LEVOTHYROXINE SODIUM 75 MCG PO TABS
75.0000 ug | ORAL_TABLET | Freq: Every day | ORAL | Status: DC
  Administered 2022-09-26 – 2022-09-27 (×2): 75 ug via ORAL
  Filled 2022-09-26 (×2): qty 1

## 2022-09-26 MED ORDER — FUROSEMIDE 10 MG/ML IJ SOLN
20.0000 mg | Freq: Once | INTRAMUSCULAR | Status: AC
  Administered 2022-09-26: 20 mg via INTRAVENOUS
  Filled 2022-09-26: qty 2

## 2022-09-26 MED ORDER — MAGNESIUM SULFATE 2 GM/50ML IV SOLN
2.0000 g | Freq: Once | INTRAVENOUS | Status: AC
  Administered 2022-09-26: 2 g via INTRAVENOUS
  Filled 2022-09-26: qty 50

## 2022-09-26 NOTE — Progress Notes (Signed)
Rounding Note    Patient Name: [name] Date of Encounter: 09/26/2022  [place] Cardiologist: Minus Breeding, MD   Subjective   Breathing improving but not back to baselie  Inpatient Medications    Scheduled Meds:  apixaban  2.5 mg Oral BID   atorvastatin  40 mg Oral Daily   isosorbide mononitrate  30 mg Oral Daily   levothyroxine  75 mcg Oral Q0600   losartan  100 mg Oral Daily   metoprolol succinate  100 mg Oral BID   Continuous Infusions:  PRN Meds: acetaminophen **OR** acetaminophen, ondansetron **OR** ondansetron (ZOFRAN) IV   Vital Signs    Vitals:   09/25/22 1753 09/25/22 1935 09/26/22 0022 09/26/22 0438  BP: (!) 119/96 133/88 (!) 149/63 134/72  Pulse: 73 66 69 66  Resp: 20 (!) '21 18 19  '$ Temp: 97.6 F (36.4 C) 97.6 F (36.4 C) 97.6 F (36.4 C) 98.4 F (36.9 C)  TempSrc: Oral Oral Oral Oral  SpO2: 98% 98% 98% 98%  Weight: 58.8 kg   57.4 kg  Height: '5\' 3"'$  (1.6 m)       Intake/Output Summary (Last 24 hours) at 09/26/2022 0735 Last data filed at 09/26/2022 0443 Gross per 24 hour  Intake 232.82 ml  Output 3150 ml  Net -2917.18 ml      09/26/2022    4:38 AM 09/25/2022    5:53 PM 09/25/2022    1:25 AM  Last 3 Weights  Weight (lbs) 126 lb 8.7 oz 129 lb 10.1 oz 126 lb  Weight (kg) 57.4 kg 58.8 kg 57.153 kg      Telemetry    SR, PVCs - Personally Reviewed  ECG    N/a - Personally Reviewed  Physical Exam   GEN: No acute distress.   Neck: mildly elevated JVD Cardiac: RRR, no murmurs, rubs, or gallops.  Respiratory:faint crackles bilateral bases GI: Soft, nontender, non-distended  MS: No edema; No deformity. Neuro:  Nonfocal  Psych: Normal affect   Labs    High Sensitivity Troponin:   Recent Labs  Lab 09/25/22 0137 09/25/22 0319  TROPONINIHS 6 7     Chemistry Recent Labs  Lab 09/25/22 0137 09/26/22 0104  NA 136 137  K 4.0 5.6*  CL 101 96*  CO2 26 30  GLUCOSE 117* 101*  BUN 14 17  CREATININE 0.81  0.93  CALCIUM 9.0 8.7*  MG 1.7 1.9  PROT  --  8.0  ALBUMIN  --  4.0  AST  --  36  ALT  --  19  ALKPHOS  --  57  BILITOT  --  1.7*  GFRNONAA >60 >60  ANIONGAP 9 11    Lipids No results for input(s): "CHOL", "TRIG", "HDL", "LABVLDL", "LDLCALC", "CHOLHDL" in the last 168 hours.  Hematology Recent Labs  Lab 09/25/22 0137  WBC 6.1  RBC 3.74*  HGB 12.0  HCT 37.1  MCV 99.2  MCH 32.1  MCHC 32.3  RDW 13.6  PLT 181   Thyroid  Recent Labs  Lab 09/25/22 0319  TSH 0.269*    BNP Recent Labs  Lab 09/25/22 0137  BNP 608.2*    DDimer No results for input(s): "DDIMER" in the last 168 hours.   Radiology    ECHOCARDIOGRAM COMPLETE  Result Date: 09/25/2022    ECHOCARDIOGRAM REPORT   Patient Name:   [name] Date of Exam: 09/25/2022 Medical Rec #:  [id number]       Height:  63.0 in Accession #:    [id number]      Weight:       126.0 lb Date of Birth:  [birth year]      BSA:          1.589 m Patient Age:    86 years        BP:           125/70 mmHg Patient Gender: F               HR:           69 bpm. Exam Location:  Inpatient Procedure: 2D Echo, Cardiac Doppler and Color Doppler Indications:    Hypervolemia [[id number]]  History:        Patient has prior history of Echocardiogram examinations, most                 recent 07/22/2021. CHF, CAD, Carotid Disease; Risk                 Factors:Hypertension and Dyslipidemia.  Sonographer:    Darlina Sicilian RDCS Referring Phys: [place]  1. Left ventricular ejection fraction, by estimation, is 60 to 65%. The left ventricle has normal function. The left ventricle has no regional wall motion abnormalities. Left ventricular diastolic parameters are consistent with Grade II diastolic dysfunction (pseudonormalization).  2. Right ventricular systolic function is normal. The right ventricular size is normal. There is normal pulmonary artery systolic pressure.  3. Right atrial size was mildly dilated.  4. The mitral valve is grossly  normal. Trivial mitral valve regurgitation. No evidence of mitral stenosis.  5. The aortic valve is calcified. Aortic valve regurgitation is not visualized. Mild aortic valve stenosis. Aortic valve area, by VTI measures 1.05 cm. Aortic valve mean gradient measures 11.0 mmHg. Aortic valve Vmax measures 2.31 m/s.  6. The inferior vena cava is normal in size with greater than 50% respiratory variability, suggesting right atrial pressure of 3 mmHg. Comparison(s): No significant change from prior study. FINDINGS  Left Ventricle: Left ventricular ejection fraction, by estimation, is 60 to 65%. The left ventricle has normal function. The left ventricle has no regional wall motion abnormalities. The left ventricular internal cavity size was normal in size. There is  no left ventricular hypertrophy. Left ventricular diastolic parameters are consistent with Grade II diastolic dysfunction (pseudonormalization). Right Ventricle: The right ventricular size is normal. No increase in right ventricular wall thickness. Right ventricular systolic function is normal. There is normal pulmonary artery systolic pressure. The tricuspid regurgitant velocity is 2.57 m/s, and  with an assumed right atrial pressure of 3 mmHg, the estimated right ventricular systolic pressure is 22.9 mmHg. Left Atrium: Left atrial size was normal in size. Right Atrium: Right atrial size was mildly dilated. Pericardium: Trivial pericardial effusion is present. The pericardial effusion is circumferential. Mitral Valve: The mitral valve is grossly normal. Trivial mitral valve regurgitation. No evidence of mitral valve stenosis. MV peak gradient, 6.1 mmHg. The mean mitral valve gradient is 2.0 mmHg. Tricuspid Valve: The tricuspid valve is grossly normal. Tricuspid valve regurgitation is not demonstrated. No evidence of tricuspid stenosis. Aortic Valve: The aortic valve is calcified. Aortic valve regurgitation is not visualized. Mild aortic stenosis is present.  Aortic valve mean gradient measures 11.0 mmHg. Aortic valve peak gradient measures 21.3 mmHg. Aortic valve area, by VTI measures 1.05 cm. Pulmonic Valve: The pulmonic valve was grossly normal. Pulmonic valve regurgitation is mild. No evidence of pulmonic stenosis. Aorta: The aortic root  and ascending aorta are structurally normal, with no evidence of dilitation. Venous: The inferior vena cava is normal in size with greater than 50% respiratory variability, suggesting right atrial pressure of 3 mmHg. IAS/Shunts: No atrial level shunt detected by color flow Doppler.  LEFT VENTRICLE PLAX 2D LVIDd:         3.90 cm   Diastology LVIDs:         1.80 cm   LV e' medial:    8.27 cm/s LV PW:         1.80 cm   LV E/e' medial:  14.1 LV IVS:        0.90 cm   LV e' lateral:   9.85 cm/s LVOT diam:     1.60 cm   LV E/e' lateral: 11.9 LV SV:         51 LV SV Index:   32 LVOT Area:     2.01 cm  RIGHT VENTRICLE RV S prime:     15.40 cm/s TAPSE (M-mode): 2.4 cm LEFT ATRIUM             Index        RIGHT ATRIUM           Index LA diam:        3.40 cm 2.14 cm/m   RA Area:     19.40 cm LA Vol (A2C):   38.8 ml 24.42 ml/m  RA Volume:   47.00 ml  29.58 ml/m LA Vol (A4C):   34.3 ml 21.59 ml/m LA Biplane Vol: 36.5 ml 22.97 ml/m  AORTIC VALVE AV Area (Vmax):    0.93 cm AV Area (Vmean):   1.02 cm AV Area (VTI):     1.05 cm AV Vmax:           230.67 cm/s AV Vmean:          151.000 cm/s AV VTI:            0.484 m AV Peak Grad:      21.3 mmHg AV Mean Grad:      11.0 mmHg LVOT Vmax:         107.00 cm/s LVOT Vmean:        76.600 cm/s LVOT VTI:          0.252 m LVOT/AV VTI ratio: 0.52  AORTA Ao Root diam: 2.70 cm MITRAL VALVE                TRICUSPID VALVE MV Area (PHT): 3.36 cm     TR Peak grad:   26.4 mmHg MV Area VTI:   1.49 cm     TR Vmax:        257.00 cm/s MV Peak grad:  6.1 mmHg MV Mean grad:  2.0 mmHg     SHUNTS MV Vmax:       1.23 m/s     Systemic VTI:  0.25 m MV Vmean:      63.5 cm/s    Systemic Diam: 1.60 cm MV Decel Time: 226  msec MV E velocity: 117.00 cm/s MV A velocity: 56.10 cm/s MV E/A ratio:  2.09 Landscape architect signed by Phineas Inches Signature Date/Time: 09/25/2022/1:48:17 PM    Final    DG Chest 2 View  Result Date: 09/25/2022 CLINICAL DATA:  Shortness of breath EXAM: CHEST - 2 VIEW COMPARISON:  Chest x-ray 09/25/2018. chest CT 09/25/2018. FINDINGS: Heart is enlarged, unchanged. There central pulmonary vascular congestion. There is a small right pleural effusion. There is  no pneumothorax or focal lung infiltrate. There are calcifications in the bilateral breast, unchanged. No acute fractures are seen. IMPRESSION: Cardiomegaly with central pulmonary vascular congestion and small right pleural effusion. Electronically Signed   By: Ronney Asters M.D.   On: 09/25/2022 02:02    Cardiac Studies     Patient Profile  [name] is a 86 y.o. female with a hx of significant 3-vessel CAD, including CTO of both LCx as well as LAD, with 99% complex and long RCA stenosis, all of which is being managed medically/conservatively, in addition to paroxysmal Afib, who is being seen 09/25/2022 for the evaluation of volume overload at the request of Dr. Roxanne Mins [facility]   Assessment & Plan    1.Acute HFpEF - in setting of severe HTN on presentation 160s/100s - initially on bipap, weaned to [place] - CXR central congestion, BNP 608 - echo LVEF 60-65%, no WMAs, grade II dd, normal RV  --transiently on NG drip now off - received IV lasix '40mg'$  x 2 doses yesterday. Negative 2.8L, slight uptrend in Cr but still WNL - breathing improved, on 1L [place] from bipap yesterday. Severe muscle cramps overnigt. Still some signs of fluid overload, dose IV lasix '20mg'$  once later today, likely change to oral tomorrow.     2.Aortic stenosis -  echo AVA VTI 1.05, mean grad 11 DI 0.52 SVI 32 - essentially mild to moderate paradoxical low flow low gradient AS  3. CAD - followed by Dr Percival Spanish, known disease that has been managed  medically  4. PVCs - keep K at 4, Mg at 2. HSe is on beta blocker   5. Afib - on eliquis for stroke prevention adjusted for age and weight - on toprol for rate control For questions or updates, please contact [place] Please consult [email or web address] for contact info under        Signed, Carlyle Dolly, MD  09/26/2022, 7:35 AM

## 2022-09-26 NOTE — Progress Notes (Signed)
PROGRESS NOTE    [name]  EPP:[id number] DOB: 11-16-1935 DOA: 09/25/2022 PCP: Reynold Bowen, MD  Outpatient Specialists:     Brief Narrative:  As per H&P done earlier: "86 yo WF with history of paroxysmal atrial fibrillation on Eliquis, coronary disease, hypertension, hypothyroidism, dyslipidemia, no prior history of CHF, presents to the ER today with a 2-week history of worsening shortness of breath and dyspnea exertion.  Patient denies any chest pain.  She has noted some worsening lower extremity edema.  She sleeps normally in a recliner.  She never lays down flat.  Denies any fever or chills.  Does not wear self on a regular basis.  Patient has noted increasing dyspnea even with performing ADLs.  This is verified by the patient's husband.  Patient does not take any daily or as needed diuretics.   On arrival to the ER, temp 97 heart rate 76 blood pressure 152/105 satting initially 95% on room air.  Patient had labored breathing in the ER.   Labs showed an elevated BNP of 608   Hemoglobin of 12   BUN of 14, creatinine 0.8   Chest x-ray showed some central pulmonary congestion but no overt pulmonary edema.   EKG showed bigeminy   As the patient's ER evaluation continued, she was noted to have increasing work of breathing to the point where she had respiratory to dress.  She was placed on BiPAP due to accessory muscle use.   EDP consulted cardiology and requested cardiology admission.  On-call cardiology fellow declined admission to the cardiac service.   [facility] contacted for admission.    ED Course: BNP 600s, increasing SOB and work of breathing. Started on bipap"  09/25/2022: Patient was seen alongside patient's husband.  According to the patient, she developed progressive shortness of breath over the last 2 weeks with associated leg edema.  Patient is currently being managed likely CHF and uncontrolled hypertension.  Echocardiogram revealed: 1. Left  ventricular ejection fraction, by estimation, is 60 to 65%. The  left ventricle has normal function. The left ventricle has no regional  wall motion abnormalities. Left ventricular diastolic parameters are  consistent with Grade II diastolic  dysfunction (pseudonormalization).   2. Right ventricular systolic function is normal. The right ventricular  size is normal. There is normal pulmonary artery systolic pressure.   3. Right atrial size was mildly dilated.   4. The mitral valve is grossly normal. Trivial mitral valve  regurgitation. No evidence of mitral stenosis.   5. The aortic valve is calcified. Aortic valve regurgitation is not  visualized. Mild aortic valve stenosis. Aortic valve area, by VTI measures  1.05 cm. Aortic valve mean gradient measures 11.0 mmHg. Aortic valve Vmax  measures 2.31 m/s.   6. The inferior vena cava is normal in size with greater than 50%  respiratory variability, suggesting right atrial pressure of 3 mmHg.    09/26/2022: Patient seen alongside patient's husband, granddaughter and daughter.  Patient has made some improvement.  Patient is off BiPAP.  Will continue IV diuresis for now.  KCl discontinued due to mildly elevated potassium (5.6).  Will repeat BMP this evening.  Will consult physical therapy.  Patient is eager to be discharged.  Hopefully, patient will be discharged back tomorrow.  Consult COC.   Assessment & Plan:   Principal Problem:   Acute respiratory failure with hypoxia (HCC) Active Problems:   Essential hypertension, benign   Hypothyroidism   Coronary artery disease involving native coronary artery of native  heart without angina pectoris   Dyslipidemia   PAF (paroxysmal atrial fibrillation) (HCC)   Hypervolemia   DNR (do not resuscitate)/DNI(Do Not Intubate)   Acute on chronic diastolic CHF (congestive heart failure) (HCC)   Acute on chronic diastolic CHF/acute respiratory failure with hypoxia ([place]) -Respiratory failure is likely  secondary to CHF exacerbation. -Cardiology input is appreciated. -Continue diuresis. -Patient is off BiPAP. -Shortness of breath have improved significantly. -Cardiology team is directing CHF management.   09/26/2022: Slowly improving.  Continue IV diuresis.  Monitor renal function and electrolytes closely.   Hypervolemia -Secondary to CHF.   09/26/2022: Improving.   PAF (paroxysmal atrial fibrillation) (HCC) -Continue anticoagulation and beta-blocker. -Heart rate is currently controlled.     Dyslipidemia Continue lipitor 40 mg   Coronary artery disease involving native coronary artery of native heart without angina pectoris Continue lipitor 40 mg daily and toprol-xl 100 mg bid.   Hypothyroidism TSH is 0.269.   Low threshold to decrease Synthroid from 88 mcg p.o. once daily to 75 mcg p.o. once daily. 09/26/2022: Repeat TSH in 4 to 6 weeks.   Essential hypertension, benign -Blood pressure control is gradually improving. -Goal blood pressure should be less than 130/80 mmHg if tolerated.     DVT prophylaxis: Eliquis Code Status: DNR/DNI. Family Communication: Husband Disposition Plan: This will depend on hospital course   Consultants:  Cardiology  Procedures:  Echocardiogram (as documented above).  Antimicrobials:  None   Subjective: Shortness of breath and volume overload improving.    Objective: Vitals:   09/26/22 0022 09/26/22 0438 09/26/22 0900 09/26/22 0901  BP: (!) 149/63 134/72 (!) 144/67 (!) 144/67  Pulse: 69 66 73 74  Resp: 18 19 (!) 23 (!) 22  Temp: 97.6 F (36.4 C) 98.4 F (36.9 C)  97.7 F (36.5 C)  TempSrc: Oral Oral  Oral  SpO2: 98% 98% 96% 96%  Weight:  57.4 kg    Height:        Intake/Output Summary (Last 24 hours) at 09/26/2022 1718 Last data filed at 09/26/2022 1536 Gross per 24 hour  Intake 472.82 ml  Output 2550 ml  Net -2077.18 ml    Filed Weights   09/25/22 0125 09/25/22 1753 09/26/22 0438  Weight: 57.2 kg 58.8 kg 57.4 kg     Examination:  General exam: Appears calm and comfortable  Respiratory system: Decreased air entry.    Cardiovascular system: S1 & S2 with ejection systolic murmur.   Gastrointestinal system: Abdomen is soft and nontender.   Central nervous system: Alert and oriented.  Patient moves all extremities.   Extremities: Mild edema.  Data Reviewed: I have personally reviewed following labs and imaging studies  CBC: Recent Labs  Lab 09/25/22 0137 09/26/22 0704  WBC 6.1 5.5  NEUTROABS  --  4.0  HGB 12.0 12.9  HCT 37.1 40.0  MCV 99.2 97.6  PLT 181 194    Basic Metabolic Panel: Recent Labs  Lab 09/25/22 0137 09/26/22 0104  NA 136 137  K 4.0 5.6*  CL 101 96*  CO2 26 30  GLUCOSE 117* 101*  BUN 14 17  CREATININE 0.81 0.93  CALCIUM 9.0 8.7*  MG 1.7 1.9    GFR: Estimated Creatinine Clearance: 37.2 mL/min (by C-G formula based on SCr of 0.93 mg/dL). Liver Function Tests: Recent Labs  Lab 09/26/22 0104  AST 36  ALT 19  ALKPHOS 57  BILITOT 1.7*  PROT 8.0  ALBUMIN 4.0   No results for input(s): "LIPASE", "AMYLASE" in the last  168 hours. No results for input(s): "AMMONIA" in the last 168 hours. Coagulation Profile: No results for input(s): "INR", "PROTIME" in the last 168 hours. Cardiac Enzymes: No results for input(s): "CKTOTAL", "CKMB", "CKMBINDEX", "TROPONINI" in the last 168 hours. BNP (last 3 results) No results for input(s): "PROBNP" in the last 8760 hours. HbA1C: No results for input(s): "HGBA1C" in the last 72 hours. CBG: No results for input(s): "GLUCAP" in the last 168 hours. Lipid Profile: No results for input(s): "CHOL", "HDL", "LDLCALC", "TRIG", "CHOLHDL", "LDLDIRECT" in the last 72 hours. Thyroid Function Tests: Recent Labs    09/25/22 0319  TSH 0.269*    Anemia Panel: No results for input(s): "VITAMINB12", "FOLATE", "FERRITIN", "TIBC", "IRON", "RETICCTPCT" in the last 72 hours. Urine analysis:    Component Value Date/Time   COLORURINE  YELLOW 11/05/2013 1332   APPEARANCEUR CLEAR 11/05/2013 1332   LABSPEC 1.011 11/05/2013 1332   PHURINE 7.0 11/05/2013 1332   GLUCOSEU NEGATIVE 11/05/2013 1332   HGBUR SMALL (A) 11/05/2013 1332   BILIRUBINUR NEGATIVE 11/05/2013 1332   KETONESUR 15 (A) 11/05/2013 1332   PROTEINUR 30 (A) 11/05/2013 1332   UROBILINOGEN 0.2 11/05/2013 1332   NITRITE NEGATIVE 11/05/2013 1332   LEUKOCYTESUR NEGATIVE 11/05/2013 1332   Sepsis Labs: '@LABRCNTIP'$ (procalcitonin:4,lacticidven:4)  ) Recent Results (from the past 240 hour(s))  Resp panel by RT-PCR (RSV, Flu A&B, Covid) Anterior Nasal Swab     Status: None   Collection Time: 09/25/22  1:26 AM   Specimen: Anterior Nasal Swab  Result Value Ref Range Status   SARS Coronavirus 2 by RT PCR NEGATIVE NEGATIVE Final    Comment: (NOTE) SARS-CoV-2 target nucleic acids are NOT DETECTED.  The SARS-CoV-2 RNA is generally detectable in upper respiratory specimens during the acute phase of infection. The lowest concentration of SARS-CoV-2 viral copies this assay can detect is 138 copies/mL. A negative result does not preclude SARS-Cov-2 infection and should not be used as the sole basis for treatment or other patient management decisions. A negative result may occur with  improper specimen collection/handling, submission of specimen other than nasopharyngeal swab, presence of viral mutation(s) within the areas targeted by this assay, and inadequate number of viral copies(<138 copies/mL). A negative result must be combined with clinical observations, patient history, and epidemiological information. The expected result is Negative.  Fact Sheet for Patients:  EntrepreneurPulse.com.au  Fact Sheet for Healthcare Providers:  IncredibleEmployment.be  This test is no t yet approved or cleared by the [place] FDA and  has been authorized for detection and/or diagnosis of SARS-CoV-2 by FDA under an Emergency Use  Authorization (EUA). This EUA will remain  in effect (meaning this test can be used) for the duration of the COVID-19 declaration under Section 564(b)(1) of the Act, 21 U.S.C.section 360bbb-3(b)(1), unless the authorization is terminated  or revoked sooner.       Influenza A by PCR NEGATIVE NEGATIVE Final   Influenza B by PCR NEGATIVE NEGATIVE Final    Comment: (NOTE) The Xpert Xpress SARS-CoV-2/FLU/RSV plus assay is intended as an aid in the diagnosis of influenza from Nasopharyngeal swab specimens and should not be used as a sole basis for treatment. Nasal washings and aspirates are unacceptable for Xpert Xpress SARS-CoV-2/FLU/RSV testing.  Fact Sheet for Patients: EntrepreneurPulse.com.au  Fact Sheet for Healthcare Providers: IncredibleEmployment.be  This test is not yet approved or cleared by the [place] FDA and has been authorized for detection and/or diagnosis of SARS-CoV-2 by FDA under an Emergency Use Authorization (EUA). This EUA will remain  in effect (meaning this test can be used) for the duration of the COVID-19 declaration under Section 564(b)(1) of the Act, 21 U.S.C. section 360bbb-3(b)(1), unless the authorization is terminated or revoked.     Resp Syncytial Virus by PCR NEGATIVE NEGATIVE Final    Comment: (NOTE) Fact Sheet for Patients: EntrepreneurPulse.com.au  Fact Sheet for Healthcare Providers: IncredibleEmployment.be  This test is not yet approved or cleared by the [place] FDA and has been authorized for detection and/or diagnosis of SARS-CoV-2 by FDA under an Emergency Use Authorization (EUA). This EUA will remain in effect (meaning this test can be used) for the duration of the COVID-19 declaration under Section 564(b)(1) of the Act, 21 U.S.C. section 360bbb-3(b)(1), unless the authorization is terminated or revoked.  Performed at [facility], [place]  [address] [place], [place] [address]          Radiology Studies: ECHOCARDIOGRAM COMPLETE  Result Date: 09/25/2022    ECHOCARDIOGRAM REPORT   Patient Name:   [name] Date of Exam: 09/25/2022 Medical Rec #:  [id number]       Height:       63.0 in Accession #:    [id number]      Weight:       126.0 lb Date of Birth:  1936-05-12      BSA:          1.589 m Patient Age:    86 years        BP:           125/70 mmHg Patient Gender: F               HR:           69 bpm. Exam Location:  Inpatient Procedure: 2D Echo, Cardiac Doppler and Color Doppler Indications:    Hypervolemia [326712]  History:        Patient has prior history of Echocardiogram examinations, most                 recent 07/22/2021. CHF, CAD, Carotid Disease; Risk                 Factors:Hypertension and Dyslipidemia.  Sonographer:    Darlina Sicilian RDCS Referring Phys: [place]  1. Left ventricular ejection fraction, by estimation, is 60 to 65%. The left ventricle has normal function. The left ventricle has no regional wall motion abnormalities. Left ventricular diastolic parameters are consistent with Grade II diastolic dysfunction (pseudonormalization).  2. Right ventricular systolic function is normal. The right ventricular size is normal. There is normal pulmonary artery systolic pressure.  3. Right atrial size was mildly dilated.  4. The mitral valve is grossly normal. Trivial mitral valve regurgitation. No evidence of mitral stenosis.  5. The aortic valve is calcified. Aortic valve regurgitation is not visualized. Mild aortic valve stenosis. Aortic valve area, by VTI measures 1.05 cm. Aortic valve mean gradient measures 11.0 mmHg. Aortic valve Vmax measures 2.31 m/s.  6. The inferior vena cava is normal in size with greater than 50% respiratory variability, suggesting right atrial pressure of 3 mmHg. Comparison(s): No significant change from prior study. FINDINGS  Left Ventricle: Left ventricular ejection fraction, by  estimation, is 60 to 65%. The left ventricle has normal function. The left ventricle has no regional wall motion abnormalities. The left ventricular internal cavity size was normal in size. There is  no left ventricular hypertrophy. Left ventricular diastolic parameters are consistent with Grade II diastolic dysfunction (  pseudonormalization). Right Ventricle: The right ventricular size is normal. No increase in right ventricular wall thickness. Right ventricular systolic function is normal. There is normal pulmonary artery systolic pressure. The tricuspid regurgitant velocity is 2.57 m/s, and  with an assumed right atrial pressure of 3 mmHg, the estimated right ventricular systolic pressure is 44.9 mmHg. Left Atrium: Left atrial size was normal in size. Right Atrium: Right atrial size was mildly dilated. Pericardium: Trivial pericardial effusion is present. The pericardial effusion is circumferential. Mitral Valve: The mitral valve is grossly normal. Trivial mitral valve regurgitation. No evidence of mitral valve stenosis. MV peak gradient, 6.1 mmHg. The mean mitral valve gradient is 2.0 mmHg. Tricuspid Valve: The tricuspid valve is grossly normal. Tricuspid valve regurgitation is not demonstrated. No evidence of tricuspid stenosis. Aortic Valve: The aortic valve is calcified. Aortic valve regurgitation is not visualized. Mild aortic stenosis is present. Aortic valve mean gradient measures 11.0 mmHg. Aortic valve peak gradient measures 21.3 mmHg. Aortic valve area, by VTI measures 1.05 cm. Pulmonic Valve: The pulmonic valve was grossly normal. Pulmonic valve regurgitation is mild. No evidence of pulmonic stenosis. Aorta: The aortic root and ascending aorta are structurally normal, with no evidence of dilitation. Venous: The inferior vena cava is normal in size with greater than 50% respiratory variability, suggesting right atrial pressure of 3 mmHg. IAS/Shunts: No atrial level shunt detected by color flow Doppler.   LEFT VENTRICLE PLAX 2D LVIDd:         3.90 cm   Diastology LVIDs:         1.80 cm   LV e' medial:    8.27 cm/s LV PW:         1.80 cm   LV E/e' medial:  14.1 LV IVS:        0.90 cm   LV e' lateral:   9.85 cm/s LVOT diam:     1.60 cm   LV E/e' lateral: 11.9 LV SV:         51 LV SV Index:   32 LVOT Area:     2.01 cm  RIGHT VENTRICLE RV S prime:     15.40 cm/s TAPSE (M-mode): 2.4 cm LEFT ATRIUM             Index        RIGHT ATRIUM           Index LA diam:        3.40 cm 2.14 cm/m   RA Area:     19.40 cm LA Vol (A2C):   38.8 ml 24.42 ml/m  RA Volume:   47.00 ml  29.58 ml/m LA Vol (A4C):   34.3 ml 21.59 ml/m LA Biplane Vol: 36.5 ml 22.97 ml/m  AORTIC VALVE AV Area (Vmax):    0.93 cm AV Area (Vmean):   1.02 cm AV Area (VTI):     1.05 cm AV Vmax:           230.67 cm/s AV Vmean:          151.000 cm/s AV VTI:            0.484 m AV Peak Grad:      21.3 mmHg AV Mean Grad:      11.0 mmHg LVOT Vmax:         107.00 cm/s LVOT Vmean:        76.600 cm/s LVOT VTI:          0.252 m LVOT/AV VTI ratio: 0.52  AORTA Ao Root  diam: 2.70 cm MITRAL VALVE                TRICUSPID VALVE MV Area (PHT): 3.36 cm     TR Peak grad:   26.4 mmHg MV Area VTI:   1.49 cm     TR Vmax:        257.00 cm/s MV Peak grad:  6.1 mmHg MV Mean grad:  2.0 mmHg     SHUNTS MV Vmax:       1.23 m/s     Systemic VTI:  0.25 m MV Vmean:      63.5 cm/s    Systemic Diam: 1.60 cm MV Decel Time: 226 msec MV E velocity: 117.00 cm/s MV A velocity: 56.10 cm/s MV E/A ratio:  2.09 Phineas Inches Electronically signed by Phineas Inches Signature Date/Time: 09/25/2022/1:48:17 PM    Final    DG Chest 2 View  Result Date: 09/25/2022 CLINICAL DATA:  Shortness of breath EXAM: CHEST - 2 VIEW COMPARISON:  Chest x-ray 09/25/2018. chest CT 09/25/2018. FINDINGS: Heart is enlarged, unchanged. There central pulmonary vascular congestion. There is a small right pleural effusion. There is no pneumothorax or focal lung infiltrate. There are calcifications in the bilateral breast,  unchanged. No acute fractures are seen. IMPRESSION: Cardiomegaly with central pulmonary vascular congestion and small right pleural effusion. Electronically Signed   By: Ronney Asters M.D.   On: 09/25/2022 02:02        Scheduled Meds:  apixaban  2.5 mg Oral BID   atorvastatin  40 mg Oral Daily   furosemide  20 mg Intravenous Once   isosorbide mononitrate  30 mg Oral Daily   levothyroxine  75 mcg Oral Q0600   losartan  100 mg Oral Daily   metoprolol succinate  100 mg Oral BID   Continuous Infusions:     LOS: 1 day    Time spent: 55 minutes.    Dana Allan, MD  [facility] Pager #: [phone] 7PM-7AM contact night coverage as above

## 2022-09-27 ENCOUNTER — Other Ambulatory Visit: Payer: Self-pay | Admitting: Internal Medicine

## 2022-09-27 DIAGNOSIS — I5031 Acute diastolic (congestive) heart failure: Secondary | ICD-10-CM | POA: Diagnosis not present

## 2022-09-27 LAB — BASIC METABOLIC PANEL
Anion gap: 10 (ref 5–15)
BUN: 19 mg/dL (ref 8–23)
CO2: 27 mmol/L (ref 22–32)
Calcium: 8.2 mg/dL — ABNORMAL LOW (ref 8.9–10.3)
Chloride: 97 mmol/L — ABNORMAL LOW (ref 98–111)
Creatinine, Ser: 0.87 mg/dL (ref 0.44–1.00)
GFR, Estimated: >60 mL/min (ref >60)
Glucose, Bld: 87 mg/dL (ref 70–99)
Potassium: 4 mmol/L (ref 3.5–5.1)
Sodium: 134 mmol/L — ABNORMAL LOW (ref 135–145)

## 2022-09-27 LAB — MAGNESIUM: Magnesium: 2.2 mg/dL (ref 1.7–2.4)

## 2022-09-27 MED ORDER — FUROSEMIDE 20 MG PO TABS: 20.0000 mg | ORAL_TABLET | Freq: Two times a day (BID) | ORAL | Status: DC

## 2022-09-27 MED ORDER — FUROSEMIDE 20 MG PO TABS: 20.0000 mg | ORAL_TABLET | Freq: Two times a day (BID) | ORAL | 0 refills | Status: DC

## 2022-09-27 MED ORDER — AMLODIPINE BESYLATE 5 MG PO TABS
5.0000 mg | ORAL_TABLET | Freq: Every day | ORAL | Status: DC
  Administered 2022-09-27: 5 mg via ORAL
  Filled 2022-09-27: qty 1

## 2022-09-27 MED ORDER — FUROSEMIDE 10 MG/ML IJ SOLN
40.0000 mg | Freq: Once | INTRAMUSCULAR | Status: AC
  Administered 2022-09-27: 40 mg via INTRAVENOUS
  Filled 2022-09-27: qty 4

## 2022-09-27 MED ORDER — LOSARTAN POTASSIUM 100 MG PO TABS: 100.0000 mg | ORAL_TABLET | Freq: Every day | ORAL | 0 refills | Status: DC

## 2022-09-27 MED ORDER — LEVOTHYROXINE SODIUM 75 MCG PO TABS: 75.0000 ug | ORAL_TABLET | Freq: Every day | ORAL | 0 refills | Status: DC

## 2022-09-27 NOTE — TOC Transition Note (Addendum)
Transition of Care [facility]) - CM/SW Discharge Note   Patient Details  Name: [name] MRN: [id number] Date of Birth: Jun 14, 1936  [facility] of [facility] CM/SW Contact:  Carles Collet, RN Phone Number: 09/27/2022, 1:42 PM   Clinical Narrative:     Damaris Schooner w patient to discuss DC needs.  Orders for [facility]. Patient lives at [facility] and would like to follow up with the therapy department there. She states that her spouse will address that with their therapy director on Tuesday to get it set up. She has all needed DME at home. Sent message to Dr Marthenia Rolling to clarify if she needs home oxygen, awaiting response, bedside nurse is obtaining ambulatory sats.  Patient qualifies for home oxygen, it will be brought to the room by [facility] for her to take home.   Final next level of care: Home/Self Care Barriers to Discharge: No Barriers Identified   Patient Goals and CMS Choice   Choice offered to / list presented to : Patient  Discharge Placement                         Discharge Plan and Services Additional resources added to the After Visit Summary for                            [facility] Arranged: Refused HH          Social Determinants of Health (SDOH) Interventions SDOH Screenings   Tobacco Use: Low Risk  (09/25/2022)     Readmission Risk Interventions     No data to display

## 2022-09-27 NOTE — Discharge Summary (Signed)
Physician Discharge Summary  Patient ID: [name] MRN: [id number] DOB/AGE: 1936/02/06 86 y.o.  Admit date: 09/25/2022 Discharge date: 09/27/2022  Admission Diagnoses:  Discharge Diagnoses:  Principal Problem:   Acute respiratory failure with hypoxia ([place]) Active Problems:   Essential hypertension, benign   Hypothyroidism   Coronary artery disease involving native coronary artery of native heart without angina pectoris   Dyslipidemia   PAF (paroxysmal atrial fibrillation) (H[facility])   Hypervolemia   DNR (do not resuscitate)/DNI(Do Not Intubate)   Acute on chronic diastolic CHF (congestive heart failure) ([place])   Discharged Condition: stable  Hospital Course: Patient is an 86 year old Caucasian female with past medical history significant for paroxysmal atrial fibrillation on Eliquis, coronary artery disease, hypertension, hypothyroidism and dyslipidemia.  Patient was admitted with 2-week history of worsening shortness of breath and dyspnea on exertion.  No prior diagnosis of congestive heart failure.  On presentation, patient endorsed orthopnea (patient sleeps on a recliner) and lower extremity edema.  Patient was admitted and managed with BiPAP and IV diuretics.  Patient improved significantly and was eager to be discharged back home.  Acute on chronic diastolic CHF/acute respiratory failure with hypoxia (HCC) -Respiratory failure was likely secondary to CHF exacerbation. -Cardiology input is appreciated. -Patient is off BiPAP. -Shortness of breath has improved significantly. -Cardiology team directed CHF management.   09/26/2022: Slowly improving.  Continue IV diuresis.  Monitor renal function and electrolytes closely.   Hypervolemia -Secondary to CHF.   09/26/2022: Improving.   PAF (paroxysmal atrial fibrillation) (HCC) -Continue anticoagulation and beta-blocker. -Heart rate is currently controlled.     Dyslipidemia Continue lipitor 40 mg   Coronary artery disease  involving native coronary artery of native heart without angina pectoris Continue lipitor 40 mg daily and toprol-xl 100 mg bid.   Hypothyroidism TSH is 0.269.   Low threshold to decrease Synthroid from 88 mcg p.o. once daily to 75 mcg p.o. once daily. 09/26/2022: Repeat TSH in 4 to 6 weeks.   Essential hypertension, benign -Blood pressure control is gradually improving. -Goal blood pressure should be less than 130/80 mmHg if tolerated.      Consults: cardiology  Significant Diagnostic Studies: Elevated BNP (608).   Discharge Exam: Blood pressure (!) 149/83, pulse 84, temperature 97.9 F (36.6 C), temperature source Tympanic, resp. rate 20, height '5\' 3"'$  (1.6 m), weight 57.2 kg, SpO2 96 %.   Disposition: Discharge disposition: 01-Home or Self Care       Discharge Instructions     Diet - low sodium heart healthy   Complete by: As directed    Increase activity slowly   Complete by: As directed       Allergies as of 09/27/2022       Reactions   Hydrocodone Other (See Comments)   "feels weird"   Prednisone Nausea And Vomiting   Oral        Medication List     TAKE these medications    amLODipine 5 MG tablet Commonly known as: NORVASC Take 1 tablet (5 mg total) by mouth in the morning and at bedtime. What changed: Another medication with the same name was removed. Continue taking this medication, and follow the directions you see here.   atorvastatin 40 MG tablet Commonly known as: LIPITOR Take 1 tablet (40 mg total) by mouth daily.   CALCIUM + D PO Take 600 mg by mouth 2 (two) times daily. Lunch and bedtime   Eliquis 2.5 MG Tabs tablet Generic drug: apixaban TAKE 1 TABLET(2.5 MG)  BY MOUTH TWICE DAILY What changed: See the new instructions.   EYE DROPS A/C OP Place 1 drop into both eyes every 6 (six) hours as needed (for dry eyes). Systane   furosemide 20 MG tablet Commonly known as: LASIX Take 1 tablet (20 mg total) by mouth 2 (two) times  daily. Start taking on: September 28, 2022   isosorbide mononitrate 30 MG 24 hr tablet Commonly known as: IMDUR Take 30 mg by mouth daily.   levothyroxine 75 MCG tablet Commonly known as: SYNTHROID Take 1 tablet (75 mcg total) by mouth daily at 6 (six) AM. Start taking on: September 28, 2022 What changed:  medication strength how much to take when to take this   losartan 100 MG tablet Commonly known as: COZAAR Take 1 tablet (100 mg total) by mouth daily. Start taking on: September 28, 2022 What changed:  medication strength how much to take   metoprolol succinate 100 MG 24 hr tablet Commonly known as: TOPROL-XL Take 100 mg by mouth 2 (two) times daily. Take with or immediately following a meal.   nitroGLYCERIN 0.4 MG SL tablet Commonly known as: NITROSTAT Place 1 tablet (0.4 mg total) under the tongue every 5 (five) minutes as needed. What changed: reasons to take this   Centrum Silver 50+Women Tabs Take 1 tablet by mouth daily.   OCUVITE PRESERVISION PO Take 1 tablet by mouth 2 (two) times daily before lunch and supper.   olopatadine 0.1 % ophthalmic solution Commonly known as: PATANOL Place 1 drop into both eyes daily.   TYLENOL PO Take 1,000 mg by mouth 2 (two) times daily.         SignedBonnell Public 09/27/2022, 1:05 PM

## 2022-09-27 NOTE — Progress Notes (Signed)
Rounding Note    Patient Name: [name] Date of Encounter: 09/27/2022  [place] Cardiologist: Minus Breeding, MD   Subjective   SOB improving  Inpatient Medications    Scheduled Meds:  apixaban  2.5 mg Oral BID   atorvastatin  40 mg Oral Daily   furosemide  20 mg Intravenous Once   isosorbide mononitrate  30 mg Oral Daily   levothyroxine  75 mcg Oral Q0600   losartan  100 mg Oral Daily   metoprolol succinate  100 mg Oral BID   Continuous Infusions:  PRN Meds: acetaminophen **OR** acetaminophen, ondansetron **OR** ondansetron (ZOFRAN) IV   Vital Signs    Vitals:   09/26/22 1750 09/26/22 1922 09/26/22 2133 09/27/22 0437  BP:  (!) 150/100 (!) 156/71 (!) 154/81  Pulse: 68 71 63 63  Resp: 20 (!) 21  18  Temp:  98.2 F (36.8 C)  97.8 F (36.6 C)  TempSrc:  Oral  Oral  SpO2:  93%  98%  Weight:    57.2 kg  Height:        Intake/Output Summary (Last 24 hours) at 09/27/2022 0716 Last data filed at 09/27/2022 0443 Gross per 24 hour  Intake 700 ml  Output 800 ml  Net -100 ml      09/27/2022    4:37 AM 09/26/2022    4:38 AM 09/25/2022    5:53 PM  Last 3 Weights  Weight (lbs) 126 lb 1.7 oz 126 lb 8.7 oz 129 lb 10.1 oz  Weight (kg) 57.2 kg 57.4 kg 58.8 kg      Telemetry    SR, PVCs - Personally Reviewed  ECG    N/a - Personally Reviewed  Physical Exam   GEN: No acute distress.   Neck: mildly elevatedJVD Cardiac: RRR, no murmurs, rubs, or gallops.  Respiratory: faint crackles bilaterally GI: Soft, nontender, non-distended  MS: No edema; No deformity. Neuro:  Nonfocal  Psych: Normal affect   Labs    High Sensitivity Troponin:   Recent Labs  Lab 09/25/22 0137 09/25/22 0319  TROPONINIHS 6 7     Chemistry Recent Labs  Lab 09/25/22 0137 09/26/22 0104 09/26/22 1703 09/27/22 0034  NA 136 137 134* 134*  K 4.0 5.6* 4.3 4.0  CL 101 96* 94* 97*  CO2 '26 30 29 27  '$ GLUCOSE 117* 101* 109* 87  BUN '14 17 22 19  '$ CREATININE  0.81 0.93 0.95 0.87  CALCIUM 9.0 8.7* 8.4* 8.2*  MG 1.7 1.9  --  2.2  PROT  --  8.0  --   --   ALBUMIN  --  4.0 3.7  --   AST  --  36  --   --   ALT  --  19  --   --   ALKPHOS  --  57  --   --   BILITOT  --  1.7*  --   --   GFRNONAA >60 >60 59* >60  ANIONGAP '9 11 11 10    '$ Lipids No results for input(s): "CHOL", "TRIG", "HDL", "LABVLDL", "LDLCALC", "CHOLHDL" in the last 168 hours.  Hematology Recent Labs  Lab 09/25/22 0137 09/26/22 0704  WBC 6.1 5.5  RBC 3.74* 4.10  HGB 12.0 12.9  HCT 37.1 40.0  MCV 99.2 97.6  MCH 32.1 31.5  MCHC 32.3 32.3  RDW 13.6 13.3  PLT 181 194   Thyroid  Recent Labs  Lab 09/25/22 0319  TSH 0.269*    BNP Recent Labs  Lab 09/25/22 0137  BNP 608.2*    DDimer No results for input(s): "DDIMER" in the last 168 hours.   Radiology    ECHOCARDIOGRAM COMPLETE  Result Date: 09/25/2022    ECHOCARDIOGRAM REPORT   Patient Name:   [name] Date of Exam: 09/25/2022 Medical Rec #:  [id number]       Height:       63.0 in Accession #:    [id number]      Weight:       126.0 lb Date of Birth:  07/31/1936      BSA:          1.589 m Patient Age:    86 years        BP:           125/70 mmHg Patient Gender: F               HR:           69 bpm. Exam Location:  Inpatient Procedure: 2D Echo, Cardiac Doppler and Color Doppler Indications:    Hypervolemia [[id number]]  History:        Patient has prior history of Echocardiogram examinations, most                 recent 07/22/2021. CHF, CAD, Carotid Disease; Risk                 Factors:Hypertension and Dyslipidemia.  Sonographer:    Darlina Sicilian RDCS Referring Phys: [place]  1. Left ventricular ejection fraction, by estimation, is 60 to 65%. The left ventricle has normal function. The left ventricle has no regional wall motion abnormalities. Left ventricular diastolic parameters are consistent with Grade II diastolic dysfunction (pseudonormalization).  2. Right ventricular systolic function is normal.  The right ventricular size is normal. There is normal pulmonary artery systolic pressure.  3. Right atrial size was mildly dilated.  4. The mitral valve is grossly normal. Trivial mitral valve regurgitation. No evidence of mitral stenosis.  5. The aortic valve is calcified. Aortic valve regurgitation is not visualized. Mild aortic valve stenosis. Aortic valve area, by VTI measures 1.05 cm. Aortic valve mean gradient measures 11.0 mmHg. Aortic valve Vmax measures 2.31 m/s.  6. The inferior vena cava is normal in size with greater than 50% respiratory variability, suggesting right atrial pressure of 3 mmHg. Comparison(s): No significant change from prior study. FINDINGS  Left Ventricle: Left ventricular ejection fraction, by estimation, is 60 to 65%. The left ventricle has normal function. The left ventricle has no regional wall motion abnormalities. The left ventricular internal cavity size was normal in size. There is  no left ventricular hypertrophy. Left ventricular diastolic parameters are consistent with Grade II diastolic dysfunction (pseudonormalization). Right Ventricle: The right ventricular size is normal. No increase in right ventricular wall thickness. Right ventricular systolic function is normal. There is normal pulmonary artery systolic pressure. The tricuspid regurgitant velocity is 2.57 m/s, and  with an assumed right atrial pressure of 3 mmHg, the estimated right ventricular systolic pressure is 69.6 mmHg. Left Atrium: Left atrial size was normal in size. Right Atrium: Right atrial size was mildly dilated. Pericardium: Trivial pericardial effusion is present. The pericardial effusion is circumferential. Mitral Valve: The mitral valve is grossly normal. Trivial mitral valve regurgitation. No evidence of mitral valve stenosis. MV peak gradient, 6.1 mmHg. The mean mitral valve gradient is 2.0 mmHg. Tricuspid Valve: The tricuspid valve is grossly normal. Tricuspid valve regurgitation is not  demonstrated. No evidence of tricuspid stenosis. Aortic Valve: The aortic valve is calcified. Aortic valve regurgitation is not visualized. Mild aortic stenosis is present. Aortic valve mean gradient measures 11.0 mmHg. Aortic valve peak gradient measures 21.3 mmHg. Aortic valve area, by VTI measures 1.05 cm. Pulmonic Valve: The pulmonic valve was grossly normal. Pulmonic valve regurgitation is mild. No evidence of pulmonic stenosis. Aorta: The aortic root and ascending aorta are structurally normal, with no evidence of dilitation. Venous: The inferior vena cava is normal in size with greater than 50% respiratory variability, suggesting right atrial pressure of 3 mmHg. IAS/Shunts: No atrial level shunt detected by color flow Doppler.  LEFT VENTRICLE PLAX 2D LVIDd:         3.90 cm   Diastology LVIDs:         1.80 cm   LV e' medial:    8.27 cm/s LV PW:         1.80 cm   LV E/e' medial:  14.1 LV IVS:        0.90 cm   LV e' lateral:   9.85 cm/s LVOT diam:     1.60 cm   LV E/e' lateral: 11.9 LV SV:         51 LV SV Index:   32 LVOT Area:     2.01 cm  RIGHT VENTRICLE RV S prime:     15.40 cm/s TAPSE (M-mode): 2.4 cm LEFT ATRIUM             Index        RIGHT ATRIUM           Index LA diam:        3.40 cm 2.14 cm/m   RA Area:     19.40 cm LA Vol (A2C):   38.8 ml 24.42 ml/m  RA Volume:   47.00 ml  29.58 ml/m LA Vol (A4C):   34.3 ml 21.59 ml/m LA Biplane Vol: 36.5 ml 22.97 ml/m  AORTIC VALVE AV Area (Vmax):    0.93 cm AV Area (Vmean):   1.02 cm AV Area (VTI):     1.05 cm AV Vmax:           230.67 cm/s AV Vmean:          151.000 cm/s AV VTI:            0.484 m AV Peak Grad:      21.3 mmHg AV Mean Grad:      11.0 mmHg LVOT Vmax:         107.00 cm/s LVOT Vmean:        76.600 cm/s LVOT VTI:          0.252 m LVOT/AV VTI ratio: 0.52  AORTA Ao Root diam: 2.70 cm MITRAL VALVE                TRICUSPID VALVE MV Area (PHT): 3.36 cm     TR Peak grad:   26.4 mmHg MV Area VTI:   1.49 cm     TR Vmax:        257.00 cm/s MV Peak  grad:  6.1 mmHg MV Mean grad:  2.0 mmHg     SHUNTS MV Vmax:       1.23 m/s     Systemic VTI:  0.25 m MV Vmean:      63.5 cm/s    Systemic Diam: 1.60 cm MV Decel Time: 226 msec MV E velocity: 117.00 cm/s MV A velocity: 56.10 cm/s MV  E/A ratio:  2.09 Landscape architect signed by Phineas Inches Signature Date/Time: 09/25/2022/1:48:17 PM    Final     Cardiac Studies     Patient Profile     [name] is a 86 y.o. female with a hx of significant 3-vessel CAD, including CTO of both LCx as well as LAD, with 99% complex and long RCA stenosis, all of which is being managed medically/conservatively, in addition to paroxysmal Afib, who is being seen 09/25/2022 for the evaluation of volume overload at the request of Dr. Roxanne Mins [facility]).    Assessment & Plan    1.Acute HFpEF - in setting of severe HTN on presentation 160s/100s - initially on bipap, weaned to [place] - CXR central congestion, BNP 608 - echo LVEF 60-65%, no WMAs, grade II dd, normal RV  --transiently on NG drip now off  -she is on IV lasix. Received '40mg'$  x 1 yesterday, lowered to '20mg'$  evening dose but per [facility] was not given - negative 114m yesterday, neg 2.9 L since admission. Renal function is stable.  - near euvolemic, dose IV lasix '40mg'$  x 1 today. Would have her ambulate with PT and if does well ok for discharge on lasix '20mg'$  bid later this afternoon   2.Aortic stenosis -  echo AVA VTI 1.05, mean grad 11 DI 0.52 SVI 32 - essentially mild to moderate paradoxical low flow low gradient AS   3. CAD - followed by Dr HPercival Spanish known disease that has been managed medically   4. PVCs - keep K at 4, Mg at 2. HSe is on beta blocker     5. Afib - on eliquis for stroke prevention adjusted for age and weight - on toprol for rate control  6. HTN - restart her home norvasc '5mg'$  daily.  For questions or updates, please contact [place]oked Lake ParkPlease consult [email or web address] for contact info under        Signed, BCarlyle Dolly MD  09/27/2022, 7:16 AM

## 2022-09-27 NOTE — Evaluation (Signed)
Physical Therapy Evaluation Patient Details Name: [name] MRN: [id number] DOB: Jun 04, 1936 Today's Date: 09/27/2022  History of Present Illness  Pt is an 86 y.o. female admitted 09/25/22 with 2-wk h/o SOB and DOE. Workup for acute hypoxic respiratory failure secondary to CHF, severe HTN. PMH includes PAF on Eliquis, CAD, HTN, osteoporosis, carotid stenosis, RA.   Clinical Impression  Pt presents with an overall decrease in functional mobility secondary to above. PTA, pt mod indep with intermittent use of rollator, lives in house at [place] with husband. Today, pt moving fairly well with HHA for stability (no RW in room); pt notes improvement in DOE, though not at baseline. Pt hopeful for d/c home today, will have necessary support from husband. If to remain admitted, will continue to follow acutely.    SATURATION QUALIFICATIONS:  Patient Saturations on Room Air at Rest = 94% Patient Saturations on [employer] while Ambulating = 87% Patient Saturations on -- Liters of oxygen while Ambulating = NT this session due to fatigue post-walk; RN aware    Recommendations for follow up therapy are one component of a multi-disciplinary discharge planning process, led by the attending physician.  Recommendations may be updated based on patient status, additional functional criteria and insurance authorization.  Follow Up Recommendations Home health PT      Assistance Recommended at Discharge Intermittent Supervision/Assistance  Patient can return home with the following  A little help with walking and/or transfers;A little help with bathing/dressing/bathroom;Assistance with cooking/housework;Assist for transportation;Help with stairs or ramp for entrance    Equipment Recommendations None recommended by PT  Recommendations for Other Services       Functional Status Assessment Patient has had a recent decline in their functional status and demonstrates the ability to make significant  improvements in function in a reasonable and predictable amount of time.     Precautions / Restrictions Precautions Precautions: Fall;Other (comment) Precaution Comments: Watch Spo2 (does not wear baseline) Restrictions Weight Bearing Restrictions: No      Mobility  Bed Mobility Overal bed mobility: Modified Independent                  Transfers Overall transfer level: Needs assistance Equipment used: None Transfers: Sit to/from Stand Sit to Stand: Min guard                Ambulation/Gait Ambulation/Gait assistance: Min guard Gait Distance (Feet): 180 Feet Assistive device: 1 person hand held assist Gait Pattern/deviations: Step-through pattern, Decreased stride length, Trunk flexed Gait velocity: Decreased     General Gait Details: slow, mostly steady gait with HHA; 3x brief standing rest breaks secondary to fatigue; pt with good awareness of activity pacing  Stairs            Wheelchair Mobility    Modified Rankin (Stroke Patients Only)       Balance Overall balance assessment: Needs assistance Sitting-balance support: No upper extremity supported, Feet supported Sitting balance-Leahy Scale: Good     Standing balance support: No upper extremity supported, Single extremity supported, During functional activity Standing balance-Leahy Scale: Fair Standing balance comment: can static stand without UE support, preference for HHA or RW                             Pertinent Vitals/Pain Pain Assessment Pain Assessment: No/denies pain    Home Living Family/patient expects to be discharged to:: Assisted living [facility]) Living Arrangements: Spouse/significant other Available Help at  Discharge: Family;Available 24 hours/day Type of Home: House Home Access: Level entry       Home Layout: One level Home Equipment: Rollator (4 wheels) Additional Comments: pt and husband live in house at [place]  Prior Level of Function : Independent/Modified Independent             Mobility Comments: mod indep with intermittent use of rollator, or "I lean on my husband" - has worked with PT services at [facility] before ADLs Comments: typically mod indep with ADLs; 1x meal/day provided by facility (husband drives them to dining hall); housekeeping provided     Hand Dominance        Extremity/Trunk Assessment   Upper Extremity Assessment Upper Extremity Assessment: Generalized weakness    Lower Extremity Assessment Lower Extremity Assessment: Generalized weakness       Communication   Communication: No difficulties  Cognition Arousal/Alertness: Awake/alert Behavior During Therapy: WFL for tasks assessed/performed Overall Cognitive Status: Within Functional Limits for tasks assessed                                          General Comments General comments (skin integrity, edema, etc.): SpO2 down to 87% on RA with ambulation when pleth reliable; resting SpO2 95% on RA. educ re: activity recommendations, DME needs, energy conservation, current Spo2    Exercises     Assessment/Plan    PT Assessment Patient needs continued PT services  PT Problem List Decreased strength;Decreased activity tolerance;Decreased balance;Decreased mobility;Cardiopulmonary status limiting activity       PT Treatment Interventions DME instruction;Gait training;Functional mobility training;Therapeutic activities;Therapeutic exercise;Balance training;Patient/family education    PT Goals (Current goals can be found in the Care Plan section)  Acute Rehab PT Goals Patient Stated Goal: return home today PT Goal Formulation: With patient Time For Goal Achievement: 10/11/22 Potential to Achieve Goals: Good    Frequency Min 3X/week     Co-evaluation               AM-PAC PT "6 Clicks" Mobility  Outcome Measure Help needed turning from your back to your side while in a flat bed  without using bedrails?: None Help needed moving from lying on your back to sitting on the side of a flat bed without using bedrails?: None Help needed moving to and from a bed to a chair (including a wheelchair)?: A Little Help needed standing up from a chair using your arms (e.g., wheelchair or bedside chair)?: A Little Help needed to walk in hospital room?: A Little Help needed climbing 3-5 steps with a railing? : A Little 6 Click Score: 20    End of Session Equipment Utilized During Treatment: Gait belt Activity Tolerance: Patient tolerated treatment well Patient left: in chair;with call bell/phone within reach Nurse Communication: Mobility status PT Visit Diagnosis: Other abnormalities of gait and mobility (R26.89);Muscle weakness (generalized) (M62.81)    Time: 8119-1478 PT Time Calculation (min) (ACUTE ONLY): 16 min   Charges:   PT Evaluation $PT Eval Moderate Complexity: [place], PT, DPT Acute Rehabilitation Services  Personal: [facility] Office: [place] 09/27/2022, 10:03 AM

## 2022-09-27 NOTE — Significant Event (Addendum)
SATURATION QUALIFICATIONS: (This note is used to comply with regulatory documentation for home oxygen)  Patient Saturations on Room Air at Rest = 96%  Patient Saturations on Room Air while Ambulating = 87%  Patient Saturations on 2 Liters of oxygen while Ambulating = 98-%  Please briefly explain why patient needs home oxygen: desats on exertion.

## 2022-09-27 NOTE — Progress Notes (Signed)
Pt on 1L [place] and stable. Pt refuses cpap at this time. Will continue to monitor

## 2022-09-30 ENCOUNTER — Telehealth: Payer: Self-pay | Admitting: Cardiology

## 2022-09-30 MED ORDER — NITROGLYCERIN 0.4 MG SL SUBL: SUBLINGUAL_TABLET | SUBLINGUAL | 3 refills | Status: DC

## 2022-09-30 NOTE — Telephone Encounter (Signed)
Pt c/o BP issue: STAT if pt c/o blurred vision, one-sided weakness or slurred speech  1. What are your last 5 BP readings?  90/50 - Left arm (Manually) 80/42 - Right arm (Manually) 104/55 - Auto BP machine 2. Are you having any other symptoms (ex. Dizziness, headache, blurred vision, passed out)? No  3. What is your BP issue? Pt's BP has been running low since hospital discharge on 12/24   Nurse would also  like a callback regarding pt's medication list

## 2022-09-30 NOTE — Telephone Encounter (Signed)
Pt's nurse is returning call. Requesting call back.

## 2022-09-30 NOTE — Telephone Encounter (Signed)
LMTCB

## 2022-09-30 NOTE — Telephone Encounter (Signed)
Spoke with Merry Lofty, RN home health nurse who wanted to review patient's medications, sodium restrictions, and fluid intake. BP 80/42, 90/50, 104/55. Patient is alert and oriented. Denies dizziness/lightheadedness.  **Patient is taking medications per dosage choice, not as prescribed at discharge:  **Amlodipine 7.29m at night   Atorvastatin 458mdaily  Eliquis 2.4m66mwice daily  Imdur 8m74mily  **Losartan 50mg32mly  **Met succ 100mg 46me daily  NTG prn ( has not taken in years)

## 2022-09-30 NOTE — Addendum Note (Signed)
Addended by: Betha Loa F on: 09/30/2022 04:33 PM   Modules accepted: Orders

## 2022-10-02 MED ORDER — AMLODIPINE BESYLATE 5 MG PO TABS: 5.0000 mg | ORAL_TABLET | Freq: Every day | ORAL | 3 refills | Status: DC

## 2022-10-02 NOTE — Telephone Encounter (Signed)
Spoke with Merry Lofty, RN to inform her that patient will now take amlodipine '5mg'$  each day (patient takes it at night). Also, advised patient of dosage change. She verbalized understanding and stated she has enough of the tablets. She will continue to keep a BP diary. Med list updated. Echo results faxed to Dr. Chauncey Cruel. [place] Dandy.

## 2022-10-02 NOTE — Addendum Note (Signed)
Addended by: Betha Loa F on: 10/02/2022 04:58 PM   Modules accepted: Orders

## 2022-10-06 DIAGNOSIS — E890 Postprocedural hypothyroidism: Secondary | ICD-10-CM | POA: Diagnosis not present

## 2022-10-06 DIAGNOSIS — I2510 Atherosclerotic heart disease of native coronary artery without angina pectoris: Secondary | ICD-10-CM | POA: Diagnosis not present

## 2022-10-06 DIAGNOSIS — J9601 Acute respiratory failure with hypoxia: Secondary | ICD-10-CM | POA: Diagnosis not present

## 2022-10-13 ENCOUNTER — Encounter: Payer: Self-pay | Admitting: Nurse Practitioner

## 2022-10-13 ENCOUNTER — Ambulatory Visit: Payer: Medicare Other | Attending: Nurse Practitioner | Admitting: Nurse Practitioner

## 2022-10-13 VITALS — BP 114/58 | HR 62 | Ht 63.0 in | Wt 128.0 lb

## 2022-10-13 DIAGNOSIS — I5032 Chronic diastolic (congestive) heart failure: Secondary | ICD-10-CM

## 2022-10-13 DIAGNOSIS — I480 Paroxysmal atrial fibrillation: Secondary | ICD-10-CM | POA: Diagnosis not present

## 2022-10-13 DIAGNOSIS — I10 Essential (primary) hypertension: Secondary | ICD-10-CM

## 2022-10-13 DIAGNOSIS — I2510 Atherosclerotic heart disease of native coronary artery without angina pectoris: Secondary | ICD-10-CM | POA: Diagnosis not present

## 2022-10-13 DIAGNOSIS — I6522 Occlusion and stenosis of left carotid artery: Secondary | ICD-10-CM

## 2022-10-13 DIAGNOSIS — I350 Nonrheumatic aortic (valve) stenosis: Secondary | ICD-10-CM | POA: Diagnosis not present

## 2022-10-13 DIAGNOSIS — I6529 Occlusion and stenosis of unspecified carotid artery: Secondary | ICD-10-CM

## 2022-10-13 DIAGNOSIS — E785 Hyperlipidemia, unspecified: Secondary | ICD-10-CM

## 2022-10-13 MED ORDER — FUROSEMIDE 20 MG PO TABS: 20.0000 mg | ORAL_TABLET | Freq: Every day | ORAL | 3 refills | Status: DC

## 2022-10-13 NOTE — Patient Instructions (Signed)
Medication Instructions:  Your physician recommends that you continue on your current medications as directed. Please refer to the Current Medication list given to you today.  *If you need a refill on your cardiac medications before your next appointment, please call your pharmacy*   Lab Work: NONE ordered at this time of appointment   If you have labs (blood work) drawn today and your tests are completely normal, you will receive your results only by: [place] (if you have MyChart) OR A paper copy in the mail If you have any lab test that is abnormal or we need to change your treatment, we will call you to review the results.   Testing/Procedures: NONE ordered at this time of appointment     Follow-Up: At [facility], you and your health needs are our priority.  As part of our continuing mission to provide you with exceptional heart care, we have created designated Provider Care Teams.  These Care Teams include your primary Cardiologist (physician) and Advanced Practice Providers (APPs -  Physician Assistants and Nurse Practitioners) who all work together to provide you with the care you need, when you need it.  We recommend signing up for the patient portal called "MyChart".  Sign up information is provided on this After Visit Summary.  MyChart is used to connect with patients for Virtual Visits (Telemedicine).  Patients are able to view lab/test results, encounter notes, upcoming appointments, etc.  Non-urgent messages can be sent to your provider as well.   To learn more about what you can do with MyChart, go to [email or web address].    Your next appointment:   2-4 month(s)  The format for your next appointment:   In Person  Provider:   Minus Breeding, MD     Other Instructions   Important Information About Sugar

## 2022-10-13 NOTE — Progress Notes (Signed)
Office Visit    Patient Name: [name] Date of Encounter: 10/13/2022  Primary Care Provider:  Reynold Bowen, MD Primary Cardiologist:  Minus Breeding, MD  Chief Complaint    87 year old female with a history of significant three-vessel CAD with CTO-left circumflex and LAD, 99% complex and long RCA stenosis, managed medically, paroxysmal atrial fibrillation, PVCs, chronic diastolic heart failure, aortic stenosis, hypertension, hyperlipidemia, carotid artery stenosis, macular degeneration and rheumatoid arthritis who presents for hospital follow-up related to acute diastolic heart failure.  Past Medical History    Past Medical History:  Diagnosis Date   CAD, UNSPECIFIED SITE    2009. 99% proximal RCA stenosis followed by 80% distal stenosis. The LAD has a 100% stenosis in the midsegment. The circumflex is patent with distal occlusion. She has been managed medically. Her EF was well-preserved.   CAROTID STENOSIS    DIVERTICULAR DISEASE    Elevated troponin- 0.58 in setting of PSVT 10/29/2013   Essential hypertension, benign    Gallstones 2009   Gastritis    H/O blood clots 1984   DVT and PE   Hx of degenerative disc disease    HYPERLIPIDEMIA    MACULAR DEGENERATION    OSTEOPOROSIS    Pneumonia    Rheumatoid arthritis(714.0)    Thyroid cancer ([place]) 2008   Past Surgical History:  Procedure Laterality Date   ABDOMINAL HYSTERECTOMY  1984   APPENDECTOMY  1959   BREAST CYST EXCISION     left   CARDIOVERSION N/A 08/13/2021   Procedure: CARDIOVERSION;  Surgeon: Skeet Latch, MD;  Location: [facility];  Service: Cardiovascular;  Laterality: N/A;   CATARACT EXTRACTION, BILATERAL     CHOLECYSTECTOMY  2009   COLONOSCOPY     ESOPHAGOGASTRODUODENOSCOPY     IM NAILING FEMORAL SHAFT RETROGRADE  2010   left   TONSILLECTOMY     WRIST SURGERY  2004   Left    Allergies  Allergies  Allergen Reactions   Hydrocodone Other (See Comments)    "feels weird"   Prednisone  Nausea And Vomiting    Oral     Labs/Other Studies Reviewed    The following studies were reviewed today: Echo 09/25/2022: IMPRESSIONS   1. Left ventricular ejection fraction, by estimation, is 60 to 65%. The  left ventricle has normal function. The left ventricle has no regional  wall motion abnormalities. Left ventricular diastolic parameters are  consistent with Grade II diastolic  dysfunction (pseudonormalization).   2. Right ventricular systolic function is normal. The right ventricular  size is normal. There is normal pulmonary artery systolic pressure.   3. Right atrial size was mildly dilated.   4. The mitral valve is grossly normal. Trivial mitral valve  regurgitation. No evidence of mitral stenosis.   5. The aortic valve is calcified. Aortic valve regurgitation is not  visualized. Mild aortic valve stenosis. Aortic valve area, by VTI measures  1.05 cm. Aortic valve mean gradient measures 11.0 mmHg. Aortic valve Vmax  measures 2.31 m/s.   6. The inferior vena cava is normal in size with greater than 50%  respiratory variability, suggesting right atrial pressure of 3 mmHg.   Comparison(s): No significant change from prior study.   Cardiac monitor 07/22/2021:  Atrial fib with controlled ventricular rate.   Averate rate was 76 bpm  Carotid ultrasound 12/13/2019: Summary:  Right Carotid: Velocities in the right ICA are consistent with a 1-39%  stenosis.  Left Carotid: Velocities in the left ICA are consistent with a 1-39%  stenosis.               The ECA appears >50% stenosed.  Vertebrals:  Bilateral vertebral arteries demonstrate antegrade flow.  Subclavians: Normal flow hemodynamics were seen in bilateral subclavian  arteries.   ETT 08/06/2015:  Blood pressure demonstrated a hypertensive response to exercise. There was no ST segment deviation noted during stress.   ETT with poor exercise tolerance (4:30); no chest pain; hypertensive BP response; no ST changes;  negative adequate ETT.  LHC pre-2010 Right coronary artery was dominant.  There was long 99% stenosis starting in the proximal RCA.  There was an 80% stenosis in the more distal RCA.  There was extensive left-to-right collaterals.  The left main was normal.  Left circumflex had 100% distal vessel.  There were left-to-left collaterals filling the distal vessel. The LAD was occluded in the mid segment.  Distal LAD filled via left-to- left collaterals from obtuse marginal to circumflex.  There were 2 patent diagonals.  There was 50% stenosis in the second diagonal and 50% proximal LAD stenosis. Recent Labs: 09/25/2022: B Natriuretic Peptide 608.2; TSH 0.269 09/26/2022: ALT 19; Hemoglobin 12.9; Platelets 194 09/27/2022: BUN 19; Creatinine, Ser 0.87; Magnesium 2.2; Potassium 4.0; Sodium 134  Recent Lipid Panel    Component Value Date/Time   CHOL 157 07/04/2015 0856   TRIG 84 07/04/2015 0856   HDL 79 07/04/2015 0856   CHOLHDL 2.0 07/04/2015 0856   VLDL 17 07/04/2015 0856   LDLCALC 61 07/04/2015 0856    History of Present Illness    87 year old female with the above past medical history including significant three-vessel CAD with CTO-left circumflex and LAD, 99% complex and long RCA stenosis, managed medically, paroxysmal atrial fibrillation, PVCs, chronic diastolic heart failure, aortic stenosis, hypertension, hyperlipidemia, carotid artery stenosis, macular degeneration and rheumatoid arthritis.  She was diagnosed with atrial fibrillation in December 2019 in the setting of acute bronchitis.  She has been stable on Eliquis and metoprolol.  She has a history of extensive CAD as above, managed medically.  She was last seen in the office on 06/12/2022 and was stable from a cardiac standpoint.  She denies symptoms concerning for angina. She was maintaining NSR.  She was hospitalized in December 2023 in the setting of acute on chronic diastolic heart failure.  She was initially treated with BiPAP  therapy.  Chest x-ray showed central congestion, BNP was elevated at 608.  Echocardiogram showed 60 to 65%, normal LV function, no RWMA, G2 DD, normal RV systolic function, mild aortic valve stenosis, mean gradient 11 mmHg.  Cardiology was consulted.  She was diuresed with IV Lasix.  She was discharged home on Lasix 20 mg daily on 09/27/2022.  She presents today for follow-up accompanied by her husband.  Since her hospitalization has been stable from a cardiac standpoint.  She notes stable chronic dyspnea, denies chest pain,, edema, PND, orthopnea, weight gain.  She was frustrated that she was not able to see Dr. Percival Spanish during her hospital stay and is eager to see him in follow-up.  Her PCP decreased her Lasix to 20 mg daily in the setting of hypotension.  Her BP has since stabilized.  Overall, she reports feeling back to her baseline.  Home Medications    Current Outpatient Medications  Medication Sig Dispense Refill   Acetaminophen (TYLENOL PO) Take 1,000 mg by mouth 2 (two) times daily.     amLODipine (NORVASC) 5 MG tablet Take 1 tablet (5 mg total) by mouth daily. 90 tablet  3   atorvastatin (LIPITOR) 40 MG tablet Take 1 tablet (40 mg total) by mouth daily. 90 tablet 3   Calcium Carbonate-Vitamin D (CALCIUM + D PO) Take 600 mg by mouth 2 (two) times daily. Lunch and bedtime     ELIQUIS 2.5 MG TABS tablet TAKE 1 TABLET(2.5 MG) BY MOUTH TWICE DAILY (Patient taking differently: Take 2.5 mg by mouth 2 (two) times daily.) 180 tablet 1   isosorbide mononitrate (IMDUR) 30 MG 24 hr tablet Take 30 mg by mouth daily.     levothyroxine (SYNTHROID) 88 MCG tablet Take 88 mcg by mouth daily before breakfast.     losartan (COZAAR) 100 MG tablet Take 1 tablet (100 mg total) by mouth daily. (Patient taking differently: Take 50 mg by mouth daily.) 30 tablet 0   metoprolol succinate (TOPROL-XL) 100 MG 24 hr tablet Take 100 mg by mouth 2 (two) times daily. Take with or immediately following a meal.     Multiple  Vitamins-Minerals (CENTRUM SILVER 50+WOMEN) TABS Take 1 tablet by mouth daily.     Multiple Vitamins-Minerals (OCUVITE PRESERVISION PO) Take 1 tablet by mouth 2 (two) times daily before lunch and supper.     nitroGLYCERIN (NITROSTAT) 0.4 MG SL tablet For chest pain, tightness, or pressure. While sitting, place 1 tablet under tongue. May be used every 5 minutes as needed, for up to 15 minutes. Do not use more than 3 tablets. 25 tablet 3   olopatadine (PATANOL) 0.1 % ophthalmic solution Place 1 drop into both eyes daily.     Tetrahydrozoline-Zn Sulfate (EYE DROPS A/C OP) Place 1 drop into both eyes every 6 (six) hours as needed (for dry eyes). Systane     furosemide (LASIX) 20 MG tablet Take 1 tablet (20 mg total) by mouth daily. 90 tablet 3   No current facility-administered medications for this visit.     Review of Systems    She denies chest pain, palpitations, pnd, orthopnea, n, v, dizziness, syncope, edema, weight gain, or early satiety. All other systems reviewed and are otherwise negative except as noted above.   Physical Exam    VS:  BP (!) 114/58 (BP Location: Left Arm, Patient Position: Sitting, Cuff Size: Normal)   Pulse 62   Ht '5\' 3"'$  (1.6 m)   Wt 128 lb (58.1 kg)   SpO2 95%   BMI 22.67 kg/m   GEN: Well nourished, well developed, in no acute distress. HEENT: normal. Neck: Supple, no JVD, carotid bruits, or masses. Cardiac: RRR, no murmurs, rubs, or gallops. No clubbing, cyanosis, edema.  Radials/DP/PT 2+ and equal bilaterally.  Respiratory:  Respirations regular and unlabored, clear to auscultation bilaterally. GI: Soft, nontender, nondistended, BS + x 4. MS: no deformity or atrophy. Skin: warm and dry, no rash. Neuro:  Strength and sensation are intact. Psych: Normal affect.  Accessory Clinical Findings    ECG personally reviewed by me today - SR, 62 bpm, 1st degree AV block, occasional PVCs - no acute changes.   Lab Results  Component Value Date   WBC 5.5 09/26/2022    HGB 12.9 09/26/2022   HCT 40.0 09/26/2022   MCV 97.6 09/26/2022   PLT 194 09/26/2022   Lab Results  Component Value Date   CREATININE 0.87 09/27/2022   BUN 19 09/27/2022   NA 134 (L) 09/27/2022   K 4.0 09/27/2022   CL 97 (L) 09/27/2022   CO2 27 09/27/2022   Lab Results  Component Value Date   ALT 19 09/26/2022  AST 36 09/26/2022   ALKPHOS 57 09/26/2022   BILITOT 1.7 (H) 09/26/2022   Lab Results  Component Value Date   CHOL 157 07/04/2015   HDL 79 07/04/2015   LDLCALC 61 07/04/2015   TRIG 84 07/04/2015   CHOLHDL 2.0 07/04/2015    Lab Results  Component Value Date   HGBA1C 6.3 (H) 08/06/2021    Assessment & Plan   1. Chronic diastolic heart failure: Echo in 09/2022 showed 60 to 65%, normal LV function, no RWMA, G2 DD, normal RV systolic function, mild aortic valve stenosis, mean gradient 11 mmHg. Euvolemic and well compensated on exam.  Discussed ongoing monitoring with daily weights, sodium and fluid recommendations.  Continue losartan, metoprolol, lasix.   2. CAD: She has three-vessel CAD with CTO-left circumflex and LAD, 99% complex and long RCA stenosis, managed medically. She has stable chronic dyspnea, otherwise stable with no anginal symptoms. No indication for ischemic evaluation.  No ASA in the setting of chronic DOAC therapy.  Continue amlodipine, losartan, metoprolol, imdur and lipitor.   3. Paroxysmal atrial fibrillation: Maintaining NSR. Continue Eliquis, metoprolol.   4. Aortic stenosis: Mild on most recent echo. Stable. Consider repeat echo in 1 year or as clinically indicated.   5. Carotid artery stenosis: Carotid dopplers in 2021 showed 1-39% BICA stenosis. No indication for repeat testing at this time.   6. Hypertension: BP well controlled. Continue current antihypertensive regimen.   7. Hyperlipidemia: LDL was 65 in 04/2022. Continue Lipitor.   8. Disposition: Follow-up in 3-4 months with Dr. Percival Spanish.      Lenna Sciara, NP 10/13/2022, 12:56  PM

## 2022-10-15 DIAGNOSIS — I5032 Chronic diastolic (congestive) heart failure: Secondary | ICD-10-CM | POA: Diagnosis not present

## 2022-10-15 DIAGNOSIS — C73 Malignant neoplasm of thyroid gland: Secondary | ICD-10-CM | POA: Diagnosis not present

## 2022-10-28 DIAGNOSIS — E8770 Fluid overload, unspecified: Secondary | ICD-10-CM | POA: Diagnosis not present

## 2022-10-28 DIAGNOSIS — I5033 Acute on chronic diastolic (congestive) heart failure: Secondary | ICD-10-CM | POA: Diagnosis not present

## 2022-10-28 DIAGNOSIS — J9601 Acute respiratory failure with hypoxia: Secondary | ICD-10-CM | POA: Diagnosis not present

## 2022-10-28 DIAGNOSIS — Z66 Do not resuscitate: Secondary | ICD-10-CM | POA: Diagnosis not present

## 2022-11-16 DIAGNOSIS — L905 Scar conditions and fibrosis of skin: Secondary | ICD-10-CM | POA: Diagnosis not present

## 2022-11-16 DIAGNOSIS — L821 Other seborrheic keratosis: Secondary | ICD-10-CM | POA: Diagnosis not present

## 2022-11-16 DIAGNOSIS — L570 Actinic keratosis: Secondary | ICD-10-CM | POA: Diagnosis not present

## 2022-11-16 DIAGNOSIS — D229 Melanocytic nevi, unspecified: Secondary | ICD-10-CM | POA: Diagnosis not present

## 2022-11-16 DIAGNOSIS — Z85828 Personal history of other malignant neoplasm of skin: Secondary | ICD-10-CM | POA: Diagnosis not present

## 2022-11-28 DIAGNOSIS — Z66 Do not resuscitate: Secondary | ICD-10-CM | POA: Diagnosis not present

## 2022-11-28 DIAGNOSIS — I5033 Acute on chronic diastolic (congestive) heart failure: Secondary | ICD-10-CM | POA: Diagnosis not present

## 2022-11-28 DIAGNOSIS — J9601 Acute respiratory failure with hypoxia: Secondary | ICD-10-CM | POA: Diagnosis not present

## 2022-11-28 DIAGNOSIS — E8770 Fluid overload, unspecified: Secondary | ICD-10-CM | POA: Diagnosis not present

## 2022-12-14 ENCOUNTER — Ambulatory Visit: Payer: Medicare Other | Admitting: Cardiology

## 2022-12-18 ENCOUNTER — Encounter: Payer: Self-pay | Admitting: Cardiology

## 2022-12-18 ENCOUNTER — Ambulatory Visit: Payer: Medicare Other | Attending: Cardiology | Admitting: Cardiology

## 2022-12-18 VITALS — BP 134/54 | HR 58 | Ht 63.0 in | Wt 128.4 lb

## 2022-12-18 DIAGNOSIS — I5033 Acute on chronic diastolic (congestive) heart failure: Secondary | ICD-10-CM

## 2022-12-18 NOTE — Patient Instructions (Signed)
Medication Instructions:  Your physician recommends that you continue on your current medications as directed. Please refer to the Current Medication list given to you today.  *If you need a refill on your cardiac medications before your next appointment, please call your pharmacy*  Follow-Up: At [facility], you and your health needs are our priority.  As part of our continuing mission to provide you with exceptional heart care, we have created designated Provider Care Teams.  These Care Teams include your primary Cardiologist (physician) and Advanced Practice Providers (APPs -  Physician Assistants and Nurse Practitioners) who all work together to provide you with the care you need, when you need it.  We recommend signing up for the patient portal called "MyChart".  Sign up information is provided on this After Visit Summary.  MyChart is used to connect with patients for Virtual Visits (Telemedicine).  Patients are able to view lab/test results, encounter notes, upcoming appointments, etc.  Non-urgent messages can be sent to your provider as well.   To learn more about what you can do with MyChart, go to [email or web address].    Your next appointment:   3 month(s)  Provider:   Minus Breeding, MD

## 2022-12-18 NOTE — Progress Notes (Signed)
Cardiology Office Note   Date:  12/18/2022   ID:  [name], DOB 09-15-1936, MRN [id number]  PCP:  Reynold Bowen, MD  Cardiologist:   Minus Breeding, MD   Chief Complaint  Patient presents with   Shortness of Breath       History of Present Illness: [name] is a 87 y.o. female who presents for follow up of CAD and atrial fibrillation.  She was admitted to the hospital in December 2019 with bronchitis.  She had an elevated troponin which was thought to be demand ischemia.  She was in new onset atrial fib.  Since I last saw her she was in the hospital with acute on chronic heart failure.  I reviewed these records.   She was sent home with 100 mg of losartan but she only wanted to take 50.  She did not want to take a higher dose of beta-blocker than she been taking.  She cut her Lasix down after getting a little lightheaded.  She is felt well although she has some chronic dyspnea.  She is not having any new PND or orthopnea.  She is not having any new palpitations, presyncope or syncope.  She has had no weight gain or edema.   Past Medical History:  Diagnosis Date   CAD, UNSPECIFIED SITE    2009. 99% proximal RCA stenosis followed by 80% distal stenosis. The LAD has a 100% stenosis in the midsegment. The circumflex is patent with distal occlusion. She has been managed medically. Her EF was well-preserved.   CAROTID STENOSIS    DIVERTICULAR DISEASE    Elevated troponin- 0.58 in setting of PSVT 10/29/2013   Essential hypertension, benign    Gallstones 2009   Gastritis    H/O blood clots 1984   DVT and PE   Hx of degenerative disc disease    HYPERLIPIDEMIA    MACULAR DEGENERATION    OSTEOPOROSIS    Pneumonia    Rheumatoid arthritis(714.0)    Thyroid cancer ([place]) 2008    Past Surgical History:  Procedure Laterality Date   ABDOMINAL HYSTERECTOMY  1984   APPENDECTOMY  1959   BREAST CYST EXCISION     left   CARDIOVERSION N/A 08/13/2021   Procedure:  CARDIOVERSION;  Surgeon: Skeet Latch, MD;  Location: [facility];  Service: Cardiovascular;  Laterality: N/A;   CATARACT EXTRACTION, BILATERAL     CHOLECYSTECTOMY  2009   COLONOSCOPY     ESOPHAGOGASTRODUODENOSCOPY     IM NAILING FEMORAL SHAFT RETROGRADE  2010   left   TONSILLECTOMY     WRIST SURGERY  2004   Left     Current Outpatient Medications  Medication Sig Dispense Refill   Acetaminophen (TYLENOL PO) Take 1,000 mg by mouth 2 (two) times daily.     amLODipine (NORVASC) 5 MG tablet Take 1 tablet (5 mg total) by mouth daily. 90 tablet 3   atorvastatin (LIPITOR) 40 MG tablet Take 1 tablet (40 mg total) by mouth daily. 90 tablet 3   Calcium Carbonate-Vitamin D (CALCIUM + D PO) Take 600 mg by mouth 2 (two) times daily. Lunch and bedtime     ELIQUIS 2.5 MG TABS tablet TAKE 1 TABLET(2.5 MG) BY MOUTH TWICE DAILY (Patient taking differently: Take 2.5 mg by mouth 2 (two) times daily.) 180 tablet 1   furosemide (LASIX) 20 MG tablet Take 1 tablet (20 mg total) by mouth daily. 90 tablet 3   isosorbide mononitrate (IMDUR) 30 MG 24  hr tablet Take 30 mg by mouth daily.     levothyroxine (SYNTHROID) 88 MCG tablet Take 88 mcg by mouth daily before breakfast.     metoprolol succinate (TOPROL-XL) 100 MG 24 hr tablet Take 100 mg by mouth 2 (two) times daily. Take with or immediately following a meal.     Multiple Vitamins-Minerals (CENTRUM SILVER 50+WOMEN) TABS Take 1 tablet by mouth daily.     nitroGLYCERIN (NITROSTAT) 0.4 MG SL tablet For chest pain, tightness, or pressure. While sitting, place 1 tablet under tongue. May be used every 5 minutes as needed, for up to 15 minutes. Do not use more than 3 tablets. 25 tablet 3   olopatadine (PATANOL) 0.1 % ophthalmic solution Place 1 drop into both eyes daily.     Tetrahydrozoline-Zn Sulfate (EYE DROPS A/C OP) Place 1 drop into both eyes every 6 (six) hours as needed (for dry eyes). Systane     losartan (COZAAR) 100 MG tablet Take 1 tablet (100 mg  total) by mouth daily. (Patient taking differently: Take 50 mg by mouth daily.) 30 tablet 0   No current facility-administered medications for this visit.    Allergies:   Hydrocodone and Prednisone     ROS:  Please see the history of present illness.   Otherwise, review of systems are positive for none.   All other systems are reviewed and negative.    PHYSICAL EXAM: VS:  BP (!) 134/54   Pulse (!) 58   Ht 5\' 3"  (1.6 m)   Wt 128 lb 6.4 oz (58.2 kg)   SpO2 96%   BMI 22.75 kg/m  , BMI Body mass index is 22.75 kg/m. GENERAL:  Well appearing NECK:  No jugular venous distention, waveform within normal limits, carotid upstroke brisk and symmetric, no bruits, no thyromegaly LUNGS:  Clear to auscultation bilaterally CHEST:  Unremarkable HEART:  PMI not displaced or sustained,S1 and S2 within normal limits, no S3, no S4, no clicks, no rubs, 2 out of 6 apical systolic murmur radiating slightly at the aortic outflow tract, no diastolic murmurs ABD:  Flat, positive bowel sounds normal in frequency in pitch, no bruits, no rebound, no guarding, no midline pulsatile mass, no hepatomegaly, no splenomegaly EXT:  2 plus pulses throughout, no edema, no cyanosis no clubbing   EKG:  EKG is not ordered today.    Recent Labs: 09/25/2022: B Natriuretic Peptide 608.2; TSH 0.269 09/26/2022: ALT 19; Hemoglobin 12.9; Platelets 194 09/27/2022: BUN 19; Creatinine, Ser 0.87; Magnesium 2.2; Potassium 4.0; Sodium 134    Lipid Panel    Component Value Date/Time   CHOL 157 07/04/2015 0856   TRIG 84 07/04/2015 0856   HDL 79 07/04/2015 0856   CHOLHDL 2.0 07/04/2015 0856   VLDL 17 07/04/2015 0856   LDLCALC 61 07/04/2015 0856      Wt Readings from Last 3 Encounters:  12/18/22 128 lb 6.4 oz (58.2 kg)  10/13/22 128 lb (58.1 kg)  09/27/22 126 lb 1.7 oz (57.2 kg)      Other studies Reviewed: Additional studies/ records that were reviewed today include: Hospital records. Review of the above records  demonstrates: See elsewhere   ASSESSMENT AND PLAN:    CAD:   The patient has no new sypmtoms.  No further cardiovascular testing is indicated.  We will continue with aggressive risk reduction and meds as listed.  ATRIAL FIB:    [name] has a CHA2DS2 - VASc score of 5.  She has not had any symptomatic  paroxysms with no change in therapy.   HTN:   Her blood pressure is well-controlled.  No change in therapy.  She has not wanted med titration because of hypotensive episodes.  I looked at the blood pressure diary and it is ranging in the [id number] systolic.  No change in therapy.    ACUTE ON CHRONIC DIASTOLIC HF: We had a long discussion about salt and fluid and as needed Lasix dosing.  Her weights have been stable.  She will continue on the meds as listed.  AORTIC STENOSIS: This was mild on the echo in December.  No change in therapy.   Current medicines are reviewed at length with the patient today.  The patient does not have concerns regarding medicines.  The following changes have been made: None  Labs/ tests ordered today include:    None  No orders of the defined types were placed in this encounter.    Disposition:   FU with me in 3 months.   Signed, Minus Breeding, MD  12/18/2022 4:04 PM    [place]

## 2022-12-23 ENCOUNTER — Other Ambulatory Visit: Payer: Self-pay | Admitting: Cardiology

## 2022-12-24 ENCOUNTER — Ambulatory Visit: Payer: Medicare Other | Admitting: Internal Medicine

## 2022-12-24 ENCOUNTER — Encounter: Payer: Self-pay | Admitting: Internal Medicine

## 2022-12-24 VITALS — BP 130/80 | HR 64 | Temp 97.7°F | Resp 18 | Ht 63.0 in | Wt 126.0 lb

## 2022-12-24 DIAGNOSIS — H6122 Impacted cerumen, left ear: Secondary | ICD-10-CM

## 2022-12-24 NOTE — Progress Notes (Signed)
Office Visit  Subjective   Patient ID: [name]   DOB: 05-19-1936   Age: 87 y.o.   MRN: [id number]   Chief Complaint Chief Complaint  Patient presents with   New Patient (Initial Visit)    Patient not able to hear from left ear.     History of Present Illness Mrs.  Brady is a 87 yo female who is a resident here at [facility] for the last 22 years who comes in today with possible left ear cerumen.  She has had cerumen impaction in th past where she is having muffled which has been going for 3 weeks.  She denies any ear pain or tinnitus or loss of hearing.    She has baby oil to see if this helps and it has not.     Past Medical History Past Medical History:  Diagnosis Date   CAD, UNSPECIFIED SITE    2009. 99% proximal RCA stenosis followed by 80% distal stenosis. The LAD has a 100% stenosis in the midsegment. The circumflex is patent with distal occlusion. She has been managed medically. Her EF was well-preserved.   CAROTID STENOSIS    DIVERTICULAR DISEASE    Elevated troponin- 0.58 in setting of PSVT 10/29/2013   Essential hypertension, benign    Gallstones 2009   Gastritis    H/O blood clots 1984   DVT and PE   Hx of degenerative disc disease    HYPERLIPIDEMIA    MACULAR DEGENERATION    OSTEOPOROSIS    Pneumonia    Rheumatoid arthritis(714.0)    Thyroid cancer ([place]) 2008     Allergies Allergies  Allergen Reactions   Hydrocodone Other (See Comments)    "feels weird"   Prednisone Nausea And Vomiting    Oral     Medications  Current Outpatient Medications:    Acetaminophen (TYLENOL PO), Take 1,000 mg by mouth 2 (two) times daily., Disp: , Rfl:    amLODipine (NORVASC) 5 MG tablet, Take 1 tablet (5 mg total) by mouth daily., Disp: 90 tablet, Rfl: 3   atorvastatin (LIPITOR) 40 MG tablet, Take 1 tablet (40 mg total) by mouth daily., Disp: 90 tablet, Rfl: 3   Calcium Carbonate-Vitamin D (CALCIUM + D PO), Take 600 mg by mouth 2 (two) times daily. Lunch and  bedtime, Disp: , Rfl:    ELIQUIS 2.5 MG TABS tablet, TAKE 1 TABLET(2.5 MG) BY MOUTH TWICE DAILY (Patient taking differently: Take 2.5 mg by mouth 2 (two) times daily.), Disp: 180 tablet, Rfl: 1   furosemide (LASIX) 20 MG tablet, Take 1 tablet (20 mg total) by mouth daily., Disp: 90 tablet, Rfl: 3   isosorbide mononitrate (IMDUR) 30 MG 24 hr tablet, Take 30 mg by mouth daily., Disp: , Rfl:    levothyroxine (SYNTHROID) 88 MCG tablet, Take 88 mcg by mouth daily before breakfast., Disp: , Rfl:    losartan (COZAAR) 100 MG tablet, Take 1 tablet (100 mg total) by mouth daily. (Patient taking differently: Take 50 mg by mouth daily.), Disp: 30 tablet, Rfl: 0   metoprolol succinate (TOPROL-XL) 100 MG 24 hr tablet, Take 100 mg by mouth 2 (two) times daily. Take with or immediately following a meal., Disp: , Rfl:    Multiple Vitamins-Minerals (CENTRUM SILVER 50+WOMEN) TABS, Take 1 tablet by mouth daily., Disp: , Rfl:    nitroGLYCERIN (NITROSTAT) 0.4 MG SL tablet, For chest pain, tightness, or pressure. While sitting, place 1 tablet under tongue. May be used every 5 minutes as needed,  for up to 15 minutes. Do not use more than 3 tablets., Disp: 25 tablet, Rfl: 3   olopatadine (PATANOL) 0.1 % ophthalmic solution, Place 1 drop into both eyes daily., Disp: , Rfl:    Tetrahydrozoline-Zn Sulfate (EYE DROPS A/C OP), Place 1 drop into both eyes every 6 (six) hours as needed (for dry eyes). Systane, Disp: , Rfl:    Review of Systems Review of Systems  Constitutional:  Negative for chills and fever.  HENT:  Negative for congestion, ear discharge, ear pain, hearing loss, sinus pain, sore throat and tinnitus.   Respiratory:  Negative for cough and shortness of breath.   Cardiovascular:  Negative for chest pain, palpitations and leg swelling.  Neurological:  Negative for dizziness and weakness.       Objective:    Vitals BP 130/80 (BP Location: Right Arm, Patient Position: Sitting, Cuff Size: Normal)   Pulse 64    Temp 97.7 F (36.5 C)   Resp 18   Ht 5\' 3"  (1.6 m)   Wt 126 lb (57.2 kg)   SpO2 95%   BMI 22.32 kg/m    Physical Examination Physical Exam Constitutional:      Appearance: Normal appearance. She is not ill-appearing.  HENT:     Right Ear: Tympanic membrane, ear canal and external ear normal.     Ears:     Comments: She has cerumen impaction of the left ear canal. Cardiovascular:     Rate and Rhythm: Normal rate and regular rhythm.     Pulses: Normal pulses.     Heart sounds: No murmur heard.    No friction rub. No gallop.  Pulmonary:     Effort: Pulmonary effort is normal. No respiratory distress.     Breath sounds: No wheezing, rhonchi or rales.  Abdominal:     General: Bowel sounds are normal. There is no distension.     Palpations: Abdomen is soft.     Tenderness: There is no abdominal tenderness.  Musculoskeletal:     Right lower leg: No edema.     Left lower leg: No edema.  Skin:    General: Skin is warm and dry.     Findings: No rash.  Neurological:     Mental Status: She is alert.        Assessment & Plan:   No problem-specific Assessment & Plan notes found for this encounter.    No follow-ups on file.   Townsend Roger, MD

## 2022-12-27 DIAGNOSIS — J9601 Acute respiratory failure with hypoxia: Secondary | ICD-10-CM | POA: Diagnosis not present

## 2022-12-27 DIAGNOSIS — E8770 Fluid overload, unspecified: Secondary | ICD-10-CM | POA: Diagnosis not present

## 2022-12-27 DIAGNOSIS — Z66 Do not resuscitate: Secondary | ICD-10-CM | POA: Diagnosis not present

## 2022-12-27 DIAGNOSIS — I5033 Acute on chronic diastolic (congestive) heart failure: Secondary | ICD-10-CM | POA: Diagnosis not present

## 2023-01-08 ENCOUNTER — Ambulatory Visit: Payer: Medicare Other | Admitting: Cardiology

## 2023-01-14 ENCOUNTER — Ambulatory Visit: Payer: Medicare Other | Admitting: Cardiology

## 2023-01-14 DIAGNOSIS — I700 Atherosclerosis of aorta: Secondary | ICD-10-CM | POA: Diagnosis not present

## 2023-01-14 DIAGNOSIS — R7303 Prediabetes: Secondary | ICD-10-CM | POA: Diagnosis not present

## 2023-01-14 DIAGNOSIS — C73 Malignant neoplasm of thyroid gland: Secondary | ICD-10-CM | POA: Diagnosis not present

## 2023-01-14 DIAGNOSIS — J439 Emphysema, unspecified: Secondary | ICD-10-CM | POA: Diagnosis not present

## 2023-01-14 DIAGNOSIS — I130 Hypertensive heart and chronic kidney disease with heart failure and stage 1 through stage 4 chronic kidney disease, or unspecified chronic kidney disease: Secondary | ICD-10-CM | POA: Diagnosis not present

## 2023-01-15 ENCOUNTER — Ambulatory Visit: Payer: Medicare Other | Admitting: Cardiology

## 2023-02-23 ENCOUNTER — Other Ambulatory Visit: Payer: Self-pay | Admitting: Cardiology

## 2023-02-23 DIAGNOSIS — I4819 Other persistent atrial fibrillation: Secondary | ICD-10-CM

## 2023-02-23 NOTE — Telephone Encounter (Signed)
Eliquis 2.5mg  refill request received. Patient is 87 years old, weight-57.2kg, Crea-0.87 on 09/27/22, Diagnosis-Afib, and last seen by Dr. Antoine Poche on 12/18/22. Dose is appropriate based on dosing criteria. Will send in refill to requested pharmacy.

## 2023-03-20 DIAGNOSIS — I350 Nonrheumatic aortic (valve) stenosis: Secondary | ICD-10-CM | POA: Insufficient documentation

## 2023-03-20 NOTE — Progress Notes (Unsigned)
Cardiology Office Note:   Date:  03/22/2023  ID:  [name], DOB [birth year], MRN [id number] PCP: Adrian Prince, MD  [facility] Providers Cardiologist:  Rollene Rotunda, MD    History of Present Illness:   [name] is a 87 y.o. female who presents for follow up of CAD and atrial fibrillation.  She was admitted to the hospital in December 2019 with bronchitis.  She had an elevated troponin which was thought to be demand ischemia.  She was in new onset atrial fib.  She was hospitalized in December 2023 with acute diastolic heart failure.    She thinks her oxygen saturations in the mid 90s.  She actually turned her oxygen back in the ED had prescribed at home because she was not using it.  Her weight went up 1 time and she had to take 2 days of extra Lasix.  Otherwise her weights are maintained.  Her blood pressure is well-controlled.  She is not describing new PND or orthopnea.  She chronically sleeps in a recliner because of varicose veins and some other discomfort but not because of breathing.  She is not getting to the point that she was in when she was in the hospital  ROS: As stated in the HPI and negative for all other systems.  Studies Reviewed:    EKG:  NA   Risk Assessment/Calculations:    CHA2DS2-VASc Score = 5   This indicates a 7.2% annual risk of stroke. The patient's score is based upon: CHF History: 0 HTN History: 1 Diabetes History: 0 Stroke History: 0 Vascular Disease History: 1 Age Score: 2 Gender Score: 1   Physical Exam:   VS:  BP 128/72 (BP Location: Left Arm, Patient Position: Sitting, Cuff Size: Normal)   Pulse 86   Ht 5\' 3"  (1.6 m)   Wt 124 lb 12.8 oz (56.6 kg)   SpO2 96%   BMI 22.11 kg/m    Wt Readings from Last 3 Encounters:  03/22/23 124 lb 12.8 oz (56.6 kg)  12/24/22 126 lb (57.2 kg)  12/18/22 128 lb 6.4 oz (58.2 kg)     GEN: Well nourished, well developed in no acute distress NECK: No JVD; No carotid bruits CARDIAC:  Irregular RR, 2 out of 6 apical systolic murmur radiating at the aortic outflow tract, no diastolic murmurs, rubs, gallops RESPIRATORY:  Clear to auscultation without rales, wheezing or rhonchi  ABDOMEN: Soft, non-tender, non-distended EXTREMITIES:  No edema; No deformity   ASSESSMENT AND PLAN:   CAD:  The patient has no new sypmtoms.  No further cardiovascular testing is indicated.  We will continue with aggressive risk reduction and meds as listed.   ATRIAL FIB:    Ms. [name] has a CHA2DS2 - VASc score of 5.  Today she is in atrial fibrillation.  She would not know that she is in this rhythm.  Her breathlessness is unchanged from January when she was in sinus rhythm for last year when she was in the hospital.  Therefore, we are going to continue to pursue rate control and anticoagulation.  She can check her heart rate with a pulse oximeter at home.  She is tolerating blood thinners.   HTN:   Her blood pressure is well controlled.  No change in therapy.  well-controlled.    ACUTE ON CHRONIC DIASTOLIC HF:   She seems to be euvolemic and very good about the dosing her diuretic as needed.  She avoids salt.  She restricts  her fluid.  She weighs himself daily.  No change in therapy.    AORTIC STENOSIS: This was mild on the echo in December 2023.   I will follow this clinically.    Follow up APP in 6 months  Signed, Rollene Rotunda, MD

## 2023-03-22 ENCOUNTER — Telehealth: Payer: Self-pay | Admitting: Cardiology

## 2023-03-22 ENCOUNTER — Ambulatory Visit: Payer: Medicare Other | Attending: Cardiology | Admitting: Cardiology

## 2023-03-22 ENCOUNTER — Encounter: Payer: Self-pay | Admitting: Cardiology

## 2023-03-22 VITALS — BP 128/72 | HR 86 | Ht 63.0 in | Wt 124.8 lb

## 2023-03-22 DIAGNOSIS — I10 Essential (primary) hypertension: Secondary | ICD-10-CM | POA: Diagnosis not present

## 2023-03-22 DIAGNOSIS — I2510 Atherosclerotic heart disease of native coronary artery without angina pectoris: Secondary | ICD-10-CM | POA: Diagnosis not present

## 2023-03-22 DIAGNOSIS — I5033 Acute on chronic diastolic (congestive) heart failure: Secondary | ICD-10-CM

## 2023-03-22 DIAGNOSIS — I480 Paroxysmal atrial fibrillation: Secondary | ICD-10-CM

## 2023-03-22 DIAGNOSIS — I350 Nonrheumatic aortic (valve) stenosis: Secondary | ICD-10-CM | POA: Diagnosis not present

## 2023-03-22 MED ORDER — LOSARTAN POTASSIUM 100 MG PO TABS: 50.0000 mg | ORAL_TABLET | Freq: Every day | ORAL | 3 refills | Status: DC

## 2023-03-22 NOTE — Telephone Encounter (Signed)
Call to patient. No answer, no VM.  RX for 50 mg sent to pharmacy.  Original from hospital was 100 mg daily but patient has been taking 50 mg and provider aware

## 2023-03-22 NOTE — Patient Instructions (Signed)
    Follow-Up: At [facility], you and your health needs are our priority.  As part of our continuing mission to provide you with exceptional heart care, we have created designated Provider Care Teams.  These Care Teams include your primary Cardiologist (physician) and Advanced Practice Providers (APPs -  Physician Assistants and Nurse Practitioners) who all work together to provide you with the care you need, when you need it.  We recommend signing up for the patient portal called "MyChart".  Sign up information is provided on this After Visit Summary.  MyChart is used to connect with patients for Virtual Visits (Telemedicine).  Patients are able to view lab/test results, encounter notes, upcoming appointments, etc.  Non-urgent messages can be sent to your provider as well.   To learn more about what you can do with MyChart, go to [email or web address].    Your next appointment:   6 month(s)  Provider:   Any app

## 2023-03-22 NOTE — Telephone Encounter (Signed)
Pt c/o medication issue:  1. Name of Medication:   losartan (COZAAR) 100 MG tablet (Expired)   2. How are you currently taking this medication (dosage and times per day)?  Patient stated she is taking one 50 mg tablet daily  3. Are you having a reaction (difficulty breathing--STAT)?   No  4. What is your medication issue?   Patient stated her medication list shows 100 mg but she is taking one 50 mg tablet daily.  Patient will need a 90-day refill sent to [id number] - [place], [place] - [address] AT [facility] [address] and only has 5-6 tablets left.

## 2023-03-23 ENCOUNTER — Ambulatory Visit: Payer: Medicare Other | Admitting: Internal Medicine

## 2023-03-23 ENCOUNTER — Encounter: Payer: Self-pay | Admitting: Internal Medicine

## 2023-03-23 VITALS — BP 116/76 | HR 88 | Temp 97.6°F | Resp 18 | Ht 63.0 in | Wt 124.1 lb

## 2023-03-23 DIAGNOSIS — H9192 Unspecified hearing loss, left ear: Secondary | ICD-10-CM | POA: Diagnosis not present

## 2023-03-23 DIAGNOSIS — H6122 Impacted cerumen, left ear: Secondary | ICD-10-CM | POA: Diagnosis not present

## 2023-03-23 NOTE — Telephone Encounter (Signed)
Called patient and advised information and RX sent per request.

## 2023-03-23 NOTE — Assessment & Plan Note (Signed)
Patient has wax left ear and will do irrigation.

## 2023-03-23 NOTE — Progress Notes (Signed)
   Acute Office Visit  Subjective:     Patient ID: [name], female    DOB: 1936/08/29, 87 y.o.   MRN: [id number]  Chief Complaint  Patient presents with   office visit    Left ear impaction     HPI Patient is in today for blocked left ear, she has same problem before but blocked again, she has hard time hearing from left ear. No other problem.   Review of Systems  Constitutional: Negative.   HENT:  Positive for hearing loss.         Objective:    BP 116/76 (BP Location: Left Arm, Patient Position: Sitting, Cuff Size: Normal)   Pulse 88   Temp 97.6 F (36.4 C)   Resp 18   Ht 5\' 3"  (1.6 m)   Wt 124 lb 2 oz (56.3 kg)   SpO2 96%   BMI 21.99 kg/m    Physical Exam Constitutional:      Appearance: Normal appearance.  HENT:     Left Ear: There is impacted cerumen.  Neurological:     General: No focal deficit present.     Mental Status: She is alert.     No results found for any visits on 03/23/23.      Assessment & Plan:   Problem List Items Addressed This Visit       Nervous and Auditory   Excessive ear wax, left - Primary    Patient has wax left ear and will do irrigation.       No orders of the defined types were placed in this encounter.   No follow-ups on file.  Eloisa Northern, MD

## 2023-04-13 DIAGNOSIS — E785 Hyperlipidemia, unspecified: Secondary | ICD-10-CM | POA: Diagnosis not present

## 2023-04-13 DIAGNOSIS — E890 Postprocedural hypothyroidism: Secondary | ICD-10-CM | POA: Diagnosis not present

## 2023-04-13 DIAGNOSIS — M810 Age-related osteoporosis without current pathological fracture: Secondary | ICD-10-CM | POA: Diagnosis not present

## 2023-04-13 DIAGNOSIS — C73 Malignant neoplasm of thyroid gland: Secondary | ICD-10-CM | POA: Diagnosis not present

## 2023-04-13 DIAGNOSIS — R7303 Prediabetes: Secondary | ICD-10-CM | POA: Diagnosis not present

## 2023-04-20 DIAGNOSIS — R82998 Other abnormal findings in urine: Secondary | ICD-10-CM | POA: Diagnosis not present

## 2023-04-20 DIAGNOSIS — I10 Essential (primary) hypertension: Secondary | ICD-10-CM | POA: Diagnosis not present

## 2023-04-20 DIAGNOSIS — M069 Rheumatoid arthritis, unspecified: Secondary | ICD-10-CM | POA: Diagnosis not present

## 2023-04-20 DIAGNOSIS — R778 Other specified abnormalities of plasma proteins: Secondary | ICD-10-CM | POA: Diagnosis not present

## 2023-04-22 DIAGNOSIS — Z1231 Encounter for screening mammogram for malignant neoplasm of breast: Secondary | ICD-10-CM | POA: Diagnosis not present

## 2023-04-29 DIAGNOSIS — H11823 Conjunctivochalasis, bilateral: Secondary | ICD-10-CM | POA: Diagnosis not present

## 2023-04-29 DIAGNOSIS — H04123 Dry eye syndrome of bilateral lacrimal glands: Secondary | ICD-10-CM | POA: Diagnosis not present

## 2023-04-29 DIAGNOSIS — H348322 Tributary (branch) retinal vein occlusion, left eye, stable: Secondary | ICD-10-CM | POA: Diagnosis not present

## 2023-04-29 DIAGNOSIS — H353132 Nonexudative age-related macular degeneration, bilateral, intermediate dry stage: Secondary | ICD-10-CM | POA: Diagnosis not present

## 2023-05-05 DIAGNOSIS — D6869 Other thrombophilia: Secondary | ICD-10-CM | POA: Diagnosis not present

## 2023-05-05 DIAGNOSIS — H3530 Unspecified macular degeneration: Secondary | ICD-10-CM | POA: Diagnosis not present

## 2023-06-14 ENCOUNTER — Encounter (INDEPENDENT_AMBULATORY_CARE_PROVIDER_SITE_OTHER): Payer: Medicare Other | Admitting: Ophthalmology

## 2023-06-14 DIAGNOSIS — L814 Other melanin hyperpigmentation: Secondary | ICD-10-CM | POA: Diagnosis not present

## 2023-06-14 DIAGNOSIS — H353132 Nonexudative age-related macular degeneration, bilateral, intermediate dry stage: Secondary | ICD-10-CM | POA: Diagnosis not present

## 2023-06-14 DIAGNOSIS — H348322 Tributary (branch) retinal vein occlusion, left eye, stable: Secondary | ICD-10-CM | POA: Diagnosis not present

## 2023-06-14 DIAGNOSIS — H353212 Exudative age-related macular degeneration, right eye, with inactive choroidal neovascularization: Secondary | ICD-10-CM | POA: Diagnosis not present

## 2023-06-14 DIAGNOSIS — D485 Neoplasm of uncertain behavior of skin: Secondary | ICD-10-CM | POA: Diagnosis not present

## 2023-06-14 DIAGNOSIS — H04123 Dry eye syndrome of bilateral lacrimal glands: Secondary | ICD-10-CM | POA: Diagnosis not present

## 2023-06-21 DIAGNOSIS — Z23 Encounter for immunization: Secondary | ICD-10-CM | POA: Diagnosis not present

## 2023-07-06 DIAGNOSIS — D0439 Carcinoma in situ of skin of other parts of face: Secondary | ICD-10-CM | POA: Diagnosis not present

## 2023-07-09 ENCOUNTER — Other Ambulatory Visit: Payer: Self-pay

## 2023-07-09 ENCOUNTER — Emergency Department (HOSPITAL_BASED_OUTPATIENT_CLINIC_OR_DEPARTMENT_OTHER): Payer: Medicare Other

## 2023-07-09 ENCOUNTER — Encounter (HOSPITAL_BASED_OUTPATIENT_CLINIC_OR_DEPARTMENT_OTHER): Payer: Self-pay

## 2023-07-09 ENCOUNTER — Emergency Department (HOSPITAL_BASED_OUTPATIENT_CLINIC_OR_DEPARTMENT_OTHER)
Admission: EM | Admit: 2023-07-09 | Discharge: 2023-07-09 | Disposition: A | Discharge status: Home / Self Care | Payer: Medicare Other | Attending: Emergency Medicine | Admitting: Emergency Medicine

## 2023-07-09 ENCOUNTER — Emergency Department (HOSPITAL_BASED_OUTPATIENT_CLINIC_OR_DEPARTMENT_OTHER): Payer: Medicare Other | Admitting: Radiology

## 2023-07-09 DIAGNOSIS — I5032 Chronic diastolic (congestive) heart failure: Secondary | ICD-10-CM | POA: Diagnosis not present

## 2023-07-09 DIAGNOSIS — Z7901 Long term (current) use of anticoagulants: Secondary | ICD-10-CM | POA: Insufficient documentation

## 2023-07-09 DIAGNOSIS — Z79899 Other long term (current) drug therapy: Secondary | ICD-10-CM | POA: Diagnosis not present

## 2023-07-09 DIAGNOSIS — I110 Hypertensive heart disease with heart failure: Secondary | ICD-10-CM | POA: Diagnosis not present

## 2023-07-09 DIAGNOSIS — R0789 Other chest pain: Secondary | ICD-10-CM | POA: Diagnosis not present

## 2023-07-09 DIAGNOSIS — R079 Chest pain, unspecified: Secondary | ICD-10-CM | POA: Insufficient documentation

## 2023-07-09 DIAGNOSIS — E871 Hypo-osmolality and hyponatremia: Secondary | ICD-10-CM | POA: Diagnosis not present

## 2023-07-09 DIAGNOSIS — D72819 Decreased white blood cell count, unspecified: Secondary | ICD-10-CM | POA: Insufficient documentation

## 2023-07-09 DIAGNOSIS — I517 Cardiomegaly: Secondary | ICD-10-CM | POA: Diagnosis not present

## 2023-07-09 DIAGNOSIS — Z85850 Personal history of malignant neoplasm of thyroid: Secondary | ICD-10-CM | POA: Diagnosis not present

## 2023-07-09 DIAGNOSIS — I2510 Atherosclerotic heart disease of native coronary artery without angina pectoris: Secondary | ICD-10-CM | POA: Diagnosis not present

## 2023-07-09 DIAGNOSIS — I4891 Unspecified atrial fibrillation: Secondary | ICD-10-CM | POA: Diagnosis not present

## 2023-07-09 DIAGNOSIS — K5730 Diverticulosis of large intestine without perforation or abscess without bleeding: Secondary | ICD-10-CM | POA: Diagnosis not present

## 2023-07-09 DIAGNOSIS — I509 Heart failure, unspecified: Secondary | ICD-10-CM | POA: Diagnosis not present

## 2023-07-09 DIAGNOSIS — I700 Atherosclerosis of aorta: Secondary | ICD-10-CM | POA: Diagnosis not present

## 2023-07-09 LAB — BASIC METABOLIC PANEL WITH GFR
Anion gap: 5 (ref 5–15)
BUN: 15 mg/dL (ref 8–23)
CO2: 32 mmol/L (ref 22–32)
Calcium: 9.1 mg/dL (ref 8.9–10.3)
Chloride: 89 mmol/L — ABNORMAL LOW (ref 98–111)
Creatinine, Ser: 0.77 mg/dL (ref 0.44–1.00)
GFR, Estimated: >60 mL/min
Glucose, Bld: 146 mg/dL — ABNORMAL HIGH (ref 70–99)
Potassium: 4.4 mmol/L (ref 3.5–5.1)
Sodium: 126 mmol/L — ABNORMAL LOW (ref 135–145)

## 2023-07-09 LAB — CBC
HCT: 35.7 % — ABNORMAL LOW (ref 36.0–46.0)
Hemoglobin: 12.1 g/dL (ref 12.0–15.0)
MCH: 31.8 pg (ref 26.0–34.0)
MCHC: 33.9 g/dL (ref 30.0–36.0)
MCV: 93.9 fL (ref 80.0–100.0)
Platelets: 185 K/uL (ref 150–400)
RBC: 3.8 MIL/uL — ABNORMAL LOW (ref 3.87–5.11)
RDW: 15.1 % (ref 11.5–15.5)
WBC: 3.8 K/uL — ABNORMAL LOW (ref 4.0–10.5)
nRBC: 0 % (ref 0.0–0.2)

## 2023-07-09 LAB — TROPONIN I (HIGH SENSITIVITY)
Troponin I (High Sensitivity): 4 ng/L (ref <18)
Troponin I (High Sensitivity): 4 ng/L

## 2023-07-09 LAB — BRAIN NATRIURETIC PEPTIDE: B Natriuretic Peptide: 519.2 pg/mL — ABNORMAL HIGH (ref 0.0–100.0)

## 2023-07-09 MED ORDER — ASPIRIN 81 MG PO CHEW
324.0000 mg | CHEWABLE_TABLET | Freq: Once | ORAL | Status: AC
  Administered 2023-07-09: 324 mg via ORAL
  Filled 2023-07-09: qty 4

## 2023-07-09 MED ORDER — NITROGLYCERIN 0.4 MG SL SUBL
0.4000 mg | SUBLINGUAL_TABLET | SUBLINGUAL | Status: DC | PRN
  Filled 2023-07-09: qty 1

## 2023-07-09 MED ORDER — FENTANYL CITRATE PF 50 MCG/ML IJ SOSY
25.0000 ug | PREFILLED_SYRINGE | Freq: Once | INTRAMUSCULAR | Status: AC
  Administered 2023-07-09: 25 ug via INTRAVENOUS
  Filled 2023-07-09: qty 1

## 2023-07-09 MED ORDER — IOHEXOL 350 MG/ML SOLN
100.0000 mL | Freq: Once | INTRAVENOUS | Status: AC | PRN
  Administered 2023-07-09: 100 mL via INTRAVENOUS

## 2023-07-09 MED ORDER — ALUM & MAG HYDROXIDE-SIMETH 200-200-20 MG/5ML PO SUSP
30.0000 mL | Freq: Once | ORAL | Status: AC
  Administered 2023-07-09: 30 mL via ORAL
  Filled 2023-07-09: qty 30

## 2023-07-09 NOTE — ED Provider Notes (Signed)
I provided a substantive portion of the care of this patient.  I personally made/approved the management plan for this patient and take responsibility for the patient management. {Remember to document shared critical care using "edcritical" dot phrase:1} EKG Interpretation Date/Time:  Friday July 09 2023 13:30:18 EDT Ventricular Rate:  82 PR Interval:    QRS Duration:  72 QT Interval:  368 QTC Calculation: 430 R Axis:   48  Text Interpretation: Atrial fibrillation Confirmed by Linwood Dibbles [phone]) on 07/09/2023 1:34:50 PM

## 2023-07-09 NOTE — ED Notes (Signed)
Reviewed AVS with patient, patient expressed understanding of directions, denies further questions at this time. 

## 2023-07-09 NOTE — Discharge Instructions (Signed)
Your sodium level was slightly low today.  Try drinking a small bit of gatorade. Follow up with dr. Evlyn Kanner for a recheck. Get help in the emergency department if you have vomiting, confusion, weakness, or seizure. Take over-the-counter Pepcid 20 mg daily and simethicone for gas. Follow up with Dr. Antoine Poche for continued pain.   Get help right away if: Your chest pain gets worse. You have a cough that gets worse, or you cough up blood. You have severe pain in your abdomen. You faint. You have sudden, unexplained chest discomfort. You have sudden, unexplained discomfort in your arms, back, neck, or jaw. You have shortness of breath at any time. You suddenly start to sweat, or your skin gets clammy. You feel nausea or you vomit. You suddenly feel lightheaded or dizzy. You have severe weakness, or unexplained weakness or fatigue. Your heart begins to beat quickly, or it feels like it is skipping beats. These symptoms may represent a serious problem that is an emergency. Do not wait to see if the symptoms will go away. Get medical help right away. Call your local emergency services (911 in the [place] Do not drive yourself to the hospital.

## 2023-07-09 NOTE — ED Provider Notes (Signed)
  Physical Exam  BP 138/88   Pulse 74   Temp 97.8 F (36.6 C) (Oral)   Resp (!) 25   SpO2 95%   Physical Exam  Procedures  Procedures  ED Course / MDM   Clinical Course as of 07/09/23 2137  [facility] Jul 09, 2023  1905 Sodium(!): 126 [AH]    Clinical Course User Index [AH] Arthor Captain, PA-C   Medical Decision Making Amount and/or Complexity of Data Reviewed Labs: ordered. Decision-making details documented in ED Course. Radiology: ordered.  Risk OTC drugs. Prescription drug management.   ***

## 2023-07-09 NOTE — ED Provider Notes (Signed)
[place] EMERGENCY DEPARTMENT AT [facility] Provider Note   CSN: [id number] Arrival date & time: 07/09/23  1320     History {Add pertinent medical, surgical, social history, OB history to HPI:1} Chief Complaint  Patient presents with   Chest Pain    LENEE FRANZE is a 87 y.o. female.  Patient with history of CAD, hypertension, hyperlipidemia, PE, afib on Elliquis, gastritis, CHF presents today with complaints of chest pain. She states that same began around 10 am this morning and has been persistent since. Pain is in her left breast and does not radiate. She notes that she has felt a similar pain previously but is unsure what was going on when she had this or when she had it last. She notes some dyspnea, however states this is her baseline and is not any worse today. She has taken several rounds of nitroglycerin without improvement. Nothing makes her pain better or worse. Denies diaphoresis, nausea, or vomiting. She is on Lasix daily and checks her weight every day and notes that she is right at her dry weight currently. Does have painful varicose veins in her calves, denies any worsening calf pain or leg swelling. No PND or orthopnea.   The history is provided by the patient. No language interpreter was used.  Chest Pain      Home Medications Prior to Admission medications   Medication Sig Start Date End Date Taking? Authorizing Provider  Acetaminophen (TYLENOL PO) Take 1,000 mg by mouth 2 (two) times daily.    [provider]  amLODipine (NORVASC) 5 MG tablet TAKE 1 TABLET(5 MG) BY MOUTH DAILY 12/25/22   Rollene Rotunda, MD  atorvastatin (LIPITOR) 40 MG tablet Take 1 tablet (40 mg total) by mouth daily. 09/05/21   Joylene Grapes, NP  Calcium Carbonate-Vitamin D (CALCIUM + D PO) Take 600 mg by mouth 2 (two) times daily. Lunch and bedtime    [provider]  ELIQUIS 2.5 MG TABS tablet TAKE 1 TABLET(2.5 MG) BY MOUTH TWICE DAILY 02/23/23   Rollene Rotunda,  MD  furosemide (LASIX) 20 MG tablet Take 1 tablet (20 mg total) by mouth daily. 10/13/22   Joylene Grapes, NP  isosorbide mononitrate (IMDUR) 30 MG 24 hr tablet Take 30 mg by mouth daily.    [provider]  levothyroxine (SYNTHROID) 88 MCG tablet Take 88 mcg by mouth daily before breakfast.    [provider]  losartan (COZAAR) 100 MG tablet Take 0.5 tablets (50 mg total) by mouth daily. 03/22/23 04/21/23  Rollene Rotunda, MD  metoprolol succinate (TOPROL-XL) 100 MG 24 hr tablet Take 100 mg by mouth 2 (two) times daily. Take with or immediately following a meal.    [provider]  Multiple Vitamins-Minerals (CENTRUM SILVER 50+WOMEN) TABS Take 1 tablet by mouth daily.    [provider]  nitroGLYCERIN (NITROSTAT) 0.4 MG SL tablet For chest pain, tightness, or pressure. While sitting, place 1 tablet under tongue. May be used every 5 minutes as needed, for up to 15 minutes. Do not use more than 3 tablets. 09/30/22   Rollene Rotunda, MD  olopatadine (PATANOL) 0.1 % ophthalmic solution Place 1 drop into both eyes daily.    [provider]  Tetrahydrozoline-Zn Sulfate (EYE DROPS A/C OP) Place 1 drop into both eyes every 6 (six) hours as needed (for dry eyes). Systane    [provider]      Allergies    Hydrocodone and Prednisone    Review of  Systems   Review of Systems  Cardiovascular:  Positive for chest pain.  All other systems reviewed and are negative.   Physical Exam Updated Vital Signs BP (!) 153/85   Pulse 74   Temp 97.8 F (36.6 C) (Oral)   Resp (!) 23   SpO2 98%  Physical Exam Vitals and nursing note reviewed.  Constitutional:      General: She is not in acute distress.    Appearance: Normal appearance. She is normal weight. She is not ill-appearing, toxic-appearing or diaphoretic.  HENT:     Head: Normocephalic and atraumatic.  Cardiovascular:     Rate and Rhythm: Normal rate and regular rhythm.     Pulses:           Dorsalis pedis pulses are 2+ on the right side and 2+ on the left side.       Posterior tibial pulses are 2+ on the right side and 2+ on the left side.     Heart sounds: Normal heart sounds.  Pulmonary:     Effort: Pulmonary effort is normal. No respiratory distress.     Breath sounds: Normal breath sounds.  Chest:     Comments: Mild generalized tenderness without overlying skin changes or crepitus Abdominal:     Palpations: Abdomen is soft.     Tenderness: There is no abdominal tenderness.  Musculoskeletal:        General: Normal range of motion.     Cervical back: Normal range of motion.     Right lower leg: No edema.     Left lower leg: No edema.     Comments: Calf tenderness per patient is consistent with baseline due to her varicose veins. No swelling  Skin:    General: Skin is warm and dry.  Neurological:     General: No focal deficit present.     Mental Status: She is alert.  Psychiatric:        Mood and Affect: Mood normal.        Behavior: Behavior normal.     ED Results / Procedures / Treatments   Labs (all labs ordered are listed, but only abnormal results are displayed) Labs Reviewed  BASIC METABOLIC PANEL - Abnormal; Notable for the following components:      Result Value   Sodium 126 (*)    Chloride 89 (*)    Glucose, Bld 146 (*)    All other components within normal limits  CBC - Abnormal; Notable for the following components:   WBC 3.8 (*)    RBC 3.80 (*)    HCT 35.7 (*)    All other components within normal limits  BRAIN NATRIURETIC PEPTIDE - Abnormal; Notable for the following components:   B Natriuretic Peptide 519.2 (*)    All other components within normal limits  TROPONIN I (HIGH SENSITIVITY)  TROPONIN I (HIGH SENSITIVITY)    EKG EKG Interpretation Date/Time:  Friday July 09 2023 13:30:18 EDT Ventricular Rate:  82 PR Interval:    QRS Duration:  72 QT Interval:  368 QTC Calculation: 430 R Axis:   48  Text Interpretation: Atrial  fibrillation Confirmed by Linwood Dibbles [phone]) on 07/09/2023 1:34:50 PM  Radiology DG Chest 2 View  Result Date: 07/09/2023 CLINICAL DATA:  Chest pain. EXAM: CHEST - 2 VIEW COMPARISON:  September 25, 2022. FINDINGS: Stable cardiomegaly.  Lungs are clear.  Bony thorax is unremarkable. IMPRESSION: No active cardiopulmonary disease. Aortic Atherosclerosis (ICD10-I70.0). Electronically Signed   By: Roque Lias  Jr M.D.   On: 07/09/2023 15:59    Procedures Procedures  {Document cardiac monitor, telemetry assessment procedure when appropriate:1}  Medications Ordered in ED Medications  nitroGLYCERIN (NITROSTAT) SL tablet 0.4 mg (has no administration in time range)  aspirin chewable tablet 324 mg (324 mg Oral Given 07/09/23 1513)  fentaNYL (SUBLIMAZE) injection 25 mcg (25 mcg Intravenous Given 07/09/23 1815)  alum & mag hydroxide-simeth (MAALOX/MYLANTA) 200-200-20 MG/5ML suspension 30 mL (30 mLs Oral Given 07/09/23 1815)  iohexol (OMNIPAQUE) 350 MG/ML injection 100 mL (100 mLs Intravenous Contrast Given 07/09/23 1821)    ED Course/ Medical Decision Making/ A&P Clinical Course as of 07/09/23 1907  Fri Jul 09, 2023  1905 Sodium(!): 126 [AH]    Clinical Course User Index [AH] Arthor Captain, PA-C   {   Click here for ABCD2, HEART and other calculatorsREFRESH Note before signing :1}          HEART Score: 5                    Medical Decision Making Amount and/or Complexity of Data Reviewed Labs: ordered. Radiology: ordered.  Risk OTC drugs. Prescription drug management.   This patient is a 87 y.o. female who presents to the ED for concern of chest pain, this involves an extensive number of treatment options, and is a complaint that carries with it a high risk of complications and morbidity. The emergent differential diagnosis prior to evaluation includes, but is not limited to,  ACS, pericarditis, myocarditis, aortic dissection, PE, pneumothorax, esophageal spasm or rupture, chronic angina,  pneumonia, bronchitis, GERD, reflux/PUD, biliary disease, pancreatitis, costochondritis, anxiety   This is not an exhaustive differential.   Past Medical History / Co-morbidities / Social History:  has a past medical history of Aortic stenosis, Atrial fibrillation (HCC), CAD, UNSPECIFIED SITE, CAROTID STENOSIS, Chronic diastolic (congestive) heart failure (HCC), DIVERTICULAR DISEASE, Elevated troponin- 0.58 in setting of PSVT (10/29/2013), Essential hypertension, benign, Gastritis, H/O blood clots (1984), degenerative disc disease, HYPERLIPIDEMIA, MACULAR DEGENERATION, OSTEOPOROSIS, Rheumatoid arthritis(714.0), and Thyroid cancer (HCC) (2008).  Additional history: Chart reviewed. Pertinent results include:   Heart cath in 2009 showed:  2009. 99% proximal RCA stenosis followed by 80% distal stenosis. The LAD has a 100% stenosis in the midsegment. The circumflex is patent with distal occlusion. She has been managed medically. Her EF was well-preserved.   Most recent echo on 09/25/22 shows EF of 60-65%  Physical Exam: Physical exam performed. The pertinent findings include: per above, no grossly abnormal physical exam findings  Lab Tests: I ordered, and personally interpreted labs.  The pertinent results include:  WBC 3.8, Na 126 potentially due to poor intake and Lasix use. BNP 519.2, however no clinical signs of fluid overload. Delta troponin 4 --> 4   Imaging Studies: I ordered imaging studies including CXR. CTA. I independently visualized and interpreted imaging which showed   CXR: NAD  CTA: pending at shift change  I agree with the radiologist interpretation.   Cardiac Monitoring:  The patient was maintained on a cardiac monitor.  My attending physician Dr. Lynelle Doctor viewed and interpreted the cardiac monitored which showed an underlying rhythm of: afib. I agree with this interpretation.   Medications: I ordered medication including sublingual nitroglycerin, aspirin, fentanyl, GI  cocktail for pain. Reevaluation of the patient after these medicines showed that the patient stayed the same. I have reviewed the patients home medicines and have made adjustments as needed.  Consultations Obtained: I requested consultation with the Dr. Cristal Deer with  cardiology,  and discussed lab and imaging findings as well as pertinent plan - they recommend: given negative troponin and pain unchanged with nitroglycerin, lower suspicion for ACS. Amenable to monitoring overnight, but likely no interventions   Disposition:  Patient with intractable chest pain despite multiple interventions. CTA is pending, however patient will likely require admission regardless given intractable pain with Heart Score 5. She is also hyponatremic at 126 which appears to be new, potentially due to poor intake and Lasix. Discussed with patient who is understanding and in agreement with this.   Care handoff to Arthor Captain, PA-C at shift change. Please see their note for further evaluation and dispo.  This is a shared visit with supervising physician Dr. Lynelle Doctor who has independently evaluated patient & provided guidance in evaluation/management/disposition, in agreement with care   Final Clinical Impression(s) / ED Diagnoses Final diagnoses:  Hyponatremia  Chest pain, unspecified type    Rx / DC Orders ED Discharge Orders     None

## 2023-07-09 NOTE — ED Triage Notes (Signed)
Pt c/o "L breast" CP onset mid-morning. States she took ntg, tums w no relief. Pressure, worse w inspiration. Hx afib, CHF, reports no issue w medications recently

## 2023-08-04 DIAGNOSIS — M549 Dorsalgia, unspecified: Secondary | ICD-10-CM | POA: Diagnosis not present

## 2023-08-20 ENCOUNTER — Other Ambulatory Visit: Payer: Self-pay | Admitting: Cardiology

## 2023-08-20 DIAGNOSIS — I4819 Other persistent atrial fibrillation: Secondary | ICD-10-CM

## 2023-08-23 NOTE — Telephone Encounter (Signed)
Prescription refill request for Eliquis received. Indication:afib Last office visit:6/24 Scr:0.77  10/24 Age: 87 Weight:56.3  kg  Prescription refilled

## 2023-09-09 ENCOUNTER — Ambulatory Visit: Payer: Medicare Other | Admitting: Internal Medicine

## 2023-09-14 ENCOUNTER — Ambulatory Visit: Payer: Medicare Other | Admitting: Internal Medicine

## 2023-09-14 ENCOUNTER — Encounter: Payer: Self-pay | Admitting: Internal Medicine

## 2023-09-14 VITALS — BP 120/70 | HR 87 | Temp 98.7°F | Resp 18 | Ht 63.0 in | Wt 126.0 lb

## 2023-09-14 DIAGNOSIS — H6122 Impacted cerumen, left ear: Secondary | ICD-10-CM

## 2023-09-14 MED ORDER — CARBAMIDE PEROXIDE 6.5 % OT SOLN: 5.0000 [drp] | Freq: Two times a day (BID) | OTIC | 2 refills | Status: DC

## 2023-09-14 NOTE — Progress Notes (Signed)
   Acute Office Visit  Subjective:     Patient ID: [name], female    DOB: January 21, 1936, 87 y.o.   MRN: [id number]  Chief Complaint  Patient presents with   office visit     Left ear wax build up    HPI Patient is in today for seems like wax is building up in her left ear. She is on eliquis and is not using anything in her ear. She has wax build up in the past and she think that her wax has came back.   Review of Systems  HENT:  Positive for ear pain and hearing loss.         Objective:    BP 120/70 (BP Location: Left Arm, Patient Position: Sitting, Cuff Size: Normal)   Pulse 87   Temp 98.7 F (37.1 C)   Resp 18   Ht 5\' 3"  (1.6 m)   Wt 126 lb (57.2 kg)   SpO2 97%   BMI 22.32 kg/m    Physical Exam Constitutional:      Appearance: Normal appearance.  HENT:     Left Ear: There is impacted cerumen.  Neurological:     Mental Status: She is alert.     No results found for any visits on 09/14/23.      Assessment & Plan:   Problem List Items Addressed This Visit       Nervous and Auditory   Excessive ear wax, left - Primary    Wax was removed with ear irrigation. I have suggested to use debrox drops in future if that build up again.        No orders of the defined types were placed in this encounter.   No follow-ups on file.  Eloisa Northern, MD

## 2023-09-14 NOTE — Assessment & Plan Note (Signed)
Wax was removed with ear irrigation. I have suggested to use debrox drops in future if that build up again.

## 2023-09-20 NOTE — Progress Notes (Addendum)
Cardiology Office Note:   Date:  09/23/2023  ID:  [name], DOB November 07, 1935, MRN [id number] PCP: Adrian Prince, MD  [facility] Providers Cardiologist:  Rollene Rotunda, MD {  History of Present Illness:   [name] is a 87 y.o. female who presents for follow up of CAD and atrial fibrillation.  She was admitted to the hospital in December 2019 with bronchitis.  She had an elevated troponin which was thought to be demand ischemia.  She was in new onset atrial fib.  She was hospitalized in December 2023 with acute diastolic heart failure.      She presents for follow up.  She has had increased SOB.  She was in the ED in October.  She had some chest pain but she does not really recall the symptoms and she thought it was abdominal pain.  I do see that she had a CT with contrast of her chest and did not have aortic dissection but I do not see that this was ordered to rule out pulmonary embolism there is no mention of this.  She did have a mildly elevated BNP.  She did not have edema on chest x-ray.  She continues to complain of shortness of breath.  She says she is short of breath just doing any activity although at home she thinks her oxygen saturations are in the mid 90s.  Today here walking around she is very slow and weak.  She is limited by back pain.  She was subjectively short of breath but her oxygen saturation went to 90%.   HR was controlled.    She is chronically slept in a chair but this is more for her legs.  She has a little chronic lower extremity leg swelling but she has had varicosities.  This really does not appear to be different.  She is not having any cough fevers or chills.  To me she is not really describing chest pressure, neck or arm discomfort.   ROS: As stated in the HPI and negative for all other systems.  Studies Reviewed:    EKG:   NA   Risk Assessment/Calculations:    CHA2DS2-VASc Score = 5   This indicates a 7.2% annual risk of stroke. The  patient's score is based upon: CHF History: 0 HTN History: 1 Diabetes History: 0 Stroke History: 0 Vascular Disease History: 1 Age Score: 2 Gender Score: 1    Physical Exam:   VS:  BP 120/75 (BP Location: Left Arm, Patient Position: Sitting, Cuff Size: Normal)   Pulse 81   Ht 5\' 3"  (1.6 m)   Wt 121 lb (54.9 kg)   SpO2 98%   BMI 21.43 kg/m    Wt Readings from Last 3 Encounters:  09/23/23 121 lb (54.9 kg)  09/14/23 126 lb (57.2 kg)  03/23/23 124 lb 2 oz (56.3 kg)     GEN: Well nourished, well developed in no acute distress NECK: No JVD; No carotid bruits CARDIAC: Irregular RR, 2 out of 6 apical systolic murmur radiating slightly at the outflow tract, no diastolic murmurs, rubs, gallops RESPIRATORY:  Clear to auscultation without rales, wheezing or rhonchi  ABDOMEN: Soft, non-tender, non-distended EXTREMITIES:  No edema; No deformity   ASSESSMENT AND PLAN:    CAD:  There is a possibility that this is an anginal equivalent and she might end up needing a right and left heart cath given her breathlessness .  I am first going to start with the study as  below.  ATRIAL FIB:    Ms. KENNIDEE SIMONTON has a CHA2DS2 - VASc score of 5.  We had her in sinus rhythm previously but she was not any less breathless.  She is in chronic atrial fibrillation with rate control now.  No change in therapy.  HTN:   Her blood pressure is at target.  No change in therapy.   CHRONIC DIASTOLIC HF:   This may be the etiology of her shortness of breath.  I am going to have her take her Lasix 20 mg every day.  She is actually only been taking it every other day.  She is already salt and is fluid restricting.  SOB: If her echo looks okay the next step for me will probably be pulmonary function testing and then I would consider heart cath.  I will check CBC, TSH and echo.  Unfortunately she did not qualify for oxygen walking around the office today.  We could repeat this when we see her.  AORTIC STENOSIS: This  was mild on the echo in December 2023.  We will assess this as above.   Follow up with me Bernadene Person NP in 3 weeks.   Signed, Rollene Rotunda, MD

## 2023-09-23 ENCOUNTER — Ambulatory Visit: Payer: Medicare Other | Attending: Cardiology | Admitting: Cardiology

## 2023-09-23 ENCOUNTER — Telehealth: Payer: Self-pay | Admitting: Cardiology

## 2023-09-23 ENCOUNTER — Encounter: Payer: Self-pay | Admitting: Cardiology

## 2023-09-23 VITALS — BP 120/75 | HR 81 | Ht 63.0 in | Wt 121.0 lb

## 2023-09-23 DIAGNOSIS — I2510 Atherosclerotic heart disease of native coronary artery without angina pectoris: Secondary | ICD-10-CM | POA: Diagnosis not present

## 2023-09-23 DIAGNOSIS — I480 Paroxysmal atrial fibrillation: Secondary | ICD-10-CM | POA: Diagnosis not present

## 2023-09-23 DIAGNOSIS — E875 Hyperkalemia: Secondary | ICD-10-CM

## 2023-09-23 DIAGNOSIS — I350 Nonrheumatic aortic (valve) stenosis: Secondary | ICD-10-CM

## 2023-09-23 DIAGNOSIS — R0602 Shortness of breath: Secondary | ICD-10-CM | POA: Diagnosis not present

## 2023-09-23 DIAGNOSIS — I5033 Acute on chronic diastolic (congestive) heart failure: Secondary | ICD-10-CM

## 2023-09-23 DIAGNOSIS — I5032 Chronic diastolic (congestive) heart failure: Secondary | ICD-10-CM

## 2023-09-23 DIAGNOSIS — I10 Essential (primary) hypertension: Secondary | ICD-10-CM | POA: Diagnosis not present

## 2023-09-23 NOTE — Patient Instructions (Signed)
Medication Instructions:  Take you furosemide 20 mg daily. *If you need a refill on your cardiac medications before your next appointment, please call your pharmacy*   Lab Work: CBC. BMET, TSH today If you have labs (blood work) drawn today and your tests are completely normal, you will receive your results only by: MyChart Message (if you have MyChart) OR A paper copy in the mail If you have any lab test that is abnormal or we need to change your treatment, we will call you to review the results.   Testing/Procedures: Your physician has requested that you have an echocardiogram. Echocardiography is a painless test that uses sound waves to create images of your heart. It provides your doctor with information about the size and shape of your heart and how well your heart's chambers and valves are working. This procedure takes approximately one hour. There are no restrictions for this procedure. Please do NOT wear cologne, perfume, aftershave, or lotions (deodorant is allowed). Please arrive 15 minutes prior to your appointment time. [address].   Please note: We ask at that you not bring children with you during ultrasound (echo/ vascular) testing. Due to room size and safety concerns, children are not allowed in the ultrasound rooms during exams. Our front office staff cannot provide observation of children in our lobby area while testing is being conducted. An adult accompanying a patient to their appointment will only be allowed in the ultrasound room at the discretion of the ultrasound technician under special circumstances. We apologize for any inconvenience.    Follow-Up: At [facility], you and your health needs are our priority.  As part of our continuing mission to provide you with exceptional heart care, we have created designated Provider Care Teams.  These Care Teams include your primary Cardiologist (physician) and Advanced Practice Providers (APPs -  Physician  Assistants and Nurse Practitioners) who all work together to provide you with the care you need, when you need it.  We recommend signing up for the patient portal called "MyChart".  Sign up information is provided on this After Visit Summary.  MyChart is used to connect with patients for Virtual Visits (Telemedicine).  Patients are able to view lab/test results, encounter notes, upcoming appointments, etc.  Non-urgent messages can be sent to your provider as well.   To learn more about what you can do with MyChart, go to [email or web address].    Your next appointment:   3 week(s)  Provider:   Bernadene Person, NP

## 2023-09-23 NOTE — Telephone Encounter (Signed)
   Received page from [facility] re critical lab value for this patient. I spoke with [facility] who advised of K of 5.9. In reviewing patient's chart, she has previously had intermittent hyperkalemia, most notably in October of 2022 when K was 5.4 and 5.8. ECG at that time without peaked T waves. I called and spoke with patient this evening. She denies any symptoms, no palpitations, dizziness, lightheadedness, etc. Discussed ED evaluation and ECG as best plan given potential for hyperkalemia to cause ventricular arrhythmias. Patient expressed strong desire to avoid the ED tonight. Given normal appearing ECG with previous ECG and patient having increased lasix to 20mg  daily as of today with dose taken after lab draw, reasonable to have her repeat a STAT BMP tomorrow morning. I strongly emphasized importance of ED evaluation if she develops any symptoms tonight. Will also have her hold Losartan (takes in the evening) given it's potential to raise serum K.  Perlie Gold, PA-C

## 2023-09-24 ENCOUNTER — Ambulatory Visit (HOSPITAL_COMMUNITY): Payer: Medicare Other | Attending: Cardiology

## 2023-09-24 DIAGNOSIS — I2510 Atherosclerotic heart disease of native coronary artery without angina pectoris: Secondary | ICD-10-CM | POA: Insufficient documentation

## 2023-09-24 DIAGNOSIS — R0602 Shortness of breath: Secondary | ICD-10-CM | POA: Diagnosis not present

## 2023-09-24 DIAGNOSIS — E875 Hyperkalemia: Secondary | ICD-10-CM | POA: Diagnosis not present

## 2023-09-24 DIAGNOSIS — I5032 Chronic diastolic (congestive) heart failure: Secondary | ICD-10-CM | POA: Diagnosis not present

## 2023-09-24 LAB — CBC
Hematocrit: 37.4 % (ref 34.0–46.6)
Hemoglobin: 12.9 g/dL (ref 11.1–15.9)
MCH: 33.3 pg — ABNORMAL HIGH (ref 26.6–33.0)
MCHC: 34.5 g/dL (ref 31.5–35.7)
MCV: 97 fL (ref 79–97)
Platelets: 205 10*3/uL (ref 150–450)
RBC: 3.87 x10E6/uL (ref 3.77–5.28)
RDW: 12.2 % (ref 11.7–15.4)
WBC: 4.8 10*3/uL (ref 3.4–10.8)

## 2023-09-24 LAB — BASIC METABOLIC PANEL
BUN/Creatinine Ratio: 19 (ref 12–28)
BUN/Creatinine Ratio: 19 (ref 12–28)
BUN: 14 mg/dL (ref 8–27)
BUN: 14 mg/dL (ref 8–27)
CO2: 26 mmol/L (ref 20–29)
CO2: 25 mmol/L (ref 20–29)
Calcium: 9.3 mg/dL (ref 8.7–10.3)
Calcium: 9.2 mg/dL (ref 8.7–10.3)
Chloride: 83 mmol/L — ABNORMAL LOW (ref 96–106)
Chloride: 82 mmol/L — ABNORMAL LOW (ref 96–106)
Creatinine, Ser: 0.73 mg/dL (ref 0.57–1.00)
Creatinine, Ser: 0.73 mg/dL (ref 0.57–1.00)
Glucose: 106 mg/dL — ABNORMAL HIGH (ref 70–99)
Glucose: 102 mg/dL — ABNORMAL HIGH (ref 70–99)
Potassium: 5.9 mmol/L (ref 3.5–5.2)
Potassium: 4.7 mmol/L (ref 3.5–5.2)
Sodium: 123 mmol/L — ABNORMAL LOW (ref 134–144)
Sodium: 121 mmol/L — ABNORMAL LOW (ref 134–144)
eGFR: 81 mL/min/{1.73_m2} (ref >59)
eGFR: 81 mL/min/{1.73_m2} (ref >59)

## 2023-09-24 LAB — ECHOCARDIOGRAM COMPLETE
AR max vel: 0.82 cm2
AV Area VTI: 0.79 cm2
AV Area mean vel: 0.87 cm2
AV Mean grad: 16 mm[Hg]
AV Peak grad: 26.9 mm[Hg]
Ao pk vel: 2.6 m/s
MV M vel: 4.39 m/s
MV Peak grad: 77.1 mm[Hg]
S' Lateral: 2 cm

## 2023-09-24 LAB — TSH: TSH: 0.471 u[IU]/mL (ref 0.450–4.500)

## 2023-09-24 NOTE — Telephone Encounter (Signed)
Patient is already here for echo. Tech is aware to send patient downstairs for labs once echo is completed.

## 2023-10-05 DIAGNOSIS — I5032 Chronic diastolic (congestive) heart failure: Secondary | ICD-10-CM | POA: Diagnosis not present

## 2023-10-05 DIAGNOSIS — R0602 Shortness of breath: Secondary | ICD-10-CM | POA: Diagnosis not present

## 2023-10-05 DIAGNOSIS — I130 Hypertensive heart and chronic kidney disease with heart failure and stage 1 through stage 4 chronic kidney disease, or unspecified chronic kidney disease: Secondary | ICD-10-CM | POA: Diagnosis not present

## 2023-10-07 ENCOUNTER — Other Ambulatory Visit: Payer: Self-pay | Admitting: Nurse Practitioner

## 2023-10-07 DIAGNOSIS — N182 Chronic kidney disease, stage 2 (mild): Secondary | ICD-10-CM | POA: Diagnosis not present

## 2023-10-07 DIAGNOSIS — R0602 Shortness of breath: Secondary | ICD-10-CM | POA: Diagnosis not present

## 2023-10-07 DIAGNOSIS — I5032 Chronic diastolic (congestive) heart failure: Secondary | ICD-10-CM | POA: Diagnosis not present

## 2023-10-11 NOTE — Progress Notes (Deleted)
 Office Visit    Patient Name: [name] Date of Encounter: 10/11/2023  Primary Care Provider:  Nichole Senior, MD Primary Cardiologist:  Lynwood Schilling, MD  Chief Complaint    88 year old female with a history of significant three-vessel CAD with CTO-left circumflex and LAD, 99% complex and long RCA stenosis, managed medically, paroxysmal atrial fibrillation, PVCs, chronic diastolic heart failure, aortic stenosis, hypertension, hyperlipidemia, carotid artery stenosis, macular degeneration and rheumatoid arthritis who presents for follow-up related to CAD and heart failure.  Past Medical History    Past Medical History:  Diagnosis Date   Aortic stenosis    Atrial fibrillation (HCC)    CAD, UNSPECIFIED SITE    2009. 99% proximal RCA stenosis followed by 80% distal stenosis. The LAD has a 100% stenosis in the midsegment. The circumflex is patent with distal occlusion. She has been managed medically. Her EF was well-preserved.   CAROTID STENOSIS    Chronic diastolic (congestive) heart failure (HCC)    DIVERTICULAR DISEASE    Elevated troponin- 0.58 in setting of PSVT 10/29/2013   Essential hypertension, benign    Gastritis    H/O blood clots 1984   DVT and PE   Hx of degenerative disc disease    HYPERLIPIDEMIA    MACULAR DEGENERATION    OSTEOPOROSIS    Rheumatoid arthritis(714.0)    Thyroid  cancer (HCC) 2008   Past Surgical History:  Procedure Laterality Date   ABDOMINAL HYSTERECTOMY  1984   APPENDECTOMY  1959   BREAST CYST EXCISION     left   CARDIOVERSION N/A 08/13/2021   Procedure: CARDIOVERSION;  Surgeon: Raford Riggs, MD;  Location: [facility] ENDOSCOPY;  Service: Cardiovascular;  Laterality: N/A;   CATARACT EXTRACTION, BILATERAL     CHOLECYSTECTOMY  2009   COLONOSCOPY     ESOPHAGOGASTRODUODENOSCOPY     IM NAILING FEMORAL SHAFT RETROGRADE  2010   left   TONSILLECTOMY     WRIST SURGERY  2004   Left    Allergies  Allergies  Allergen Reactions   Hydrocodone  Other (See Comments)    feels weird   Prednisone Nausea And Vomiting    Oral     Labs/Other Studies Reviewed    The following studies were reviewed today: *** Cardiac Studies & Procedures     STRESS TESTS  EXERCISE TOLERANCE TEST (ETT) 08/06/2015  Narrative  Blood pressure demonstrated a hypertensive response to exercise.  There was no ST segment deviation noted during stress.  ETT with poor exercise tolerance (4:30); no chest pain; hypertensive BP response; no ST changes; negative adequate ETT.  ECHOCARDIOGRAM  ECHOCARDIOGRAM COMPLETE 09/24/2023  Narrative ECHOCARDIOGRAM REPORT    Patient Name:   [name] Date of Exam: 09/24/2023 Medical Rec #:  [id number]       Height:       63.0 in Accession #:    [id number]      Weight:       121.0 lb Date of Birth:  [birth year]      BSA:          1.562 m Patient Age:    87 years        BP:           120/75 mmHg Patient Gender: F               HR:           78 bpm. Exam Location:  [address]  Procedure: 2D Echo, Color Doppler, Cardiac Doppler and 3D  Echo  Indications:    Acute dyspnea R06.02  History:        Patient has prior history of Echocardiogram examinations, most recent 09/25/2022. CHF, CAD, Arrythmias:Atrial Fibrillation; Risk Factors:Hypertension and Dyslipidemia.  Sonographer:    Augustin Seals RDCS Referring Phys: [id number] JAMES HOCHREIN  IMPRESSIONS   1. Left ventricular ejection fraction, by estimation, is 60 to 65%. Left ventricular ejection fraction by 3D volume is 62 %. The left ventricle has normal function. The left ventricle has no regional wall motion abnormalities. There is mild left ventricular hypertrophy. Left ventricular diastolic function could not be evaluated. 2. Right ventricular systolic function is moderately reduced. The right ventricular size is normal. There is moderately elevated pulmonary artery systolic pressure. The estimated right ventricular systolic pressure is 47.3  mmHg. 3. Right atrial size was moderately dilated. 4. The mitral valve is abnormal. Mild mitral valve regurgitation. 5. Tricuspid valve regurgitation is mild to moderate. 6. The aortic valve is moderately calcified. Aortic valve regurgitation is not visualized. Suspect low gradient moderate aortic valve stenosis. Aortic valve area, by VTI measures 0.79 cm (based on an LVOT diameter of 1.8 cm). Aortic valve mean gradient measures 16.0 mmHg. Aortic valve Vmax measures 2.60 m/s. Peak gradient 26.9 mmHg, DI 0.31. 7. The inferior vena cava is dilated in size with <50% respiratory variability, suggesting right atrial pressure of 15 mmHg. 8. Rhythm strip during this exam demonstrates atrial fibrillation.  Comparison(s): Changes from prior study are noted. 09/25/2022: LVEF 60-65%, mild AS - mean gradient 11 mmHg.  FINDINGS Left Ventricle: Left ventricular ejection fraction, by estimation, is 60 to 65%. Left ventricular ejection fraction by 3D volume is 62 %. The left ventricle has normal function. The left ventricle has no regional wall motion abnormalities. The left ventricular internal cavity size was normal in size. There is mild left ventricular hypertrophy. Left ventricular diastolic function could not be evaluated due to atrial fibrillation. Left ventricular diastolic function could not be evaluated.  Right Ventricle: The right ventricular size is normal. No increase in right ventricular wall thickness. Right ventricular systolic function is moderately reduced. There is moderately elevated pulmonary artery systolic pressure. The tricuspid regurgitant velocity is 2.84 m/s, and with an assumed right atrial pressure of 15 mmHg, the estimated right ventricular systolic pressure is 47.3 mmHg.  Left Atrium: Left atrial size was normal in size.  Right Atrium: Right atrial size was moderately dilated.  Pericardium: There is no evidence of pericardial effusion.  Mitral Valve: The mitral valve is  abnormal. There is mild thickening of the mitral valve leaflet(s). There is mild calcification of the mitral valve leaflet(s). Mild mitral valve regurgitation.  Tricuspid Valve: The tricuspid valve is grossly normal. Tricuspid valve regurgitation is mild to moderate.  Aortic Valve: The aortic valve is calcified. There is moderate calcification of the aortic valve. Aortic valve regurgitation is not visualized. Moderate aortic stenosis is present. Aortic valve mean gradient measures 16.0 mmHg. Aortic valve peak gradient measures 26.9 mmHg. Aortic valve area, by VTI measures 0.79 cm.  Pulmonic Valve: The pulmonic valve was grossly normal. Pulmonic valve regurgitation is trivial.  Aorta: The aortic root and ascending aorta are structurally normal, with no evidence of dilitation.  Venous: The inferior vena cava is dilated in size with less than 50% respiratory variability, suggesting right atrial pressure of 15 mmHg.  IAS/Shunts: No atrial level shunt detected by color flow Doppler.  EKG: Rhythm strip during this exam demonstrates atrial fibrillation.   LEFT VENTRICLE PLAX 2D LVIDd:  3.20 cm LVIDs:         2.00 cm LV PW:         1.00 cm         3D Volume EF LV IVS:        1.10 cm         LV 3D EF:    Left LVOT diam:     1.80 cm                      ventricul LV SV:         41                           ar LV SV Index:   26                           ejection LVOT Area:     2.54 cm                     fraction by 3D volume is 62 %.  3D Volume EF: 3D EF:        62 % LV EDV:       67 ml LV ESV:       26 ml LV SV:        41 ml  RIGHT VENTRICLE          IVC RV Basal diam:  2.60 cm  IVC diam: 2.40 cm  LEFT ATRIUM             Index        RIGHT ATRIUM           Index LA diam:        3.20 cm 2.05 cm/m   RA Area:     23.00 cm LA Vol (A2C):   47.6 ml 30.48 ml/m  RA Volume:   60.10 ml  38.48 ml/m LA Vol (A4C):   33.5 ml 21.45 ml/m LA Biplane Vol: 40.8 ml 26.12 ml/m AORTIC  VALVE AV Area (Vmax):    0.82 cm AV Area (Vmean):   0.87 cm AV Area (VTI):     0.79 cm AV Vmax:           259.50 cm/s AV Vmean:          170.750 cm/s AV VTI:            0.520 m AV Peak Grad:      26.9 mmHg AV Mean Grad:      16.0 mmHg LVOT Vmax:         83.40 cm/s LVOT Vmean:        58.300 cm/s LVOT VTI:          0.161 m LVOT/AV VTI ratio: 0.31  AORTA Ao Root diam: 3.00 cm Ao Asc diam:  3.20 cm  MR Peak grad: 77.1 mmHg   TRICUSPID VALVE MR Mean grad: 51.0 mmHg   TR Peak grad:   32.3 mmHg MR Vmax:      439.00 cm/s TR Vmax:        284.00 cm/s MR Vmean:     343.0 cm/s SHUNTS Systemic VTI:  0.16 m Systemic Diam: 1.80 cm  Vinie Maxcy MD Electronically signed by Vinie Maxcy MD Signature Date/Time: 09/24/2023/12:58:17 PM    Final   MONITORS  LONG TERM MONITOR (3-14 DAYS) 07/22/2021  Narrative Atrial fib with controlled  ventricular rate. Averate rate was 76 bpm          Recent Labs: 07/09/2023: B Natriuretic Peptide 519.2 09/23/2023: Hemoglobin 12.9; Platelets 205; TSH 0.471 09/24/2023: BUN 14; Creatinine, Ser 0.73; Potassium 4.7; Sodium 121  Recent Lipid Panel    Component Value Date/Time   CHOL 157 07/04/2015 0856   TRIG 84 07/04/2015 0856   HDL 79 07/04/2015 0856   CHOLHDL 2.0 07/04/2015 0856   VLDL 17 07/04/2015 0856   LDLCALC 61 07/04/2015 0856    History of Present Illness    88 year old female with the above past medical history including significant three-vessel CAD with CTO-left circumflex and LAD, 99% complex and long RCA stenosis, managed medically, paroxysmal atrial fibrillation, PVCs, chronic diastolic heart failure, aortic stenosis, hypertension, hyperlipidemia, carotid artery stenosis, macular degeneration and rheumatoid arthritis.   She was diagnosed with atrial fibrillation in December 2019 in the setting of acute bronchitis.  She has been stable on Eliquis  and metoprolol .  She has a history of extensive CAD as above, managed medically.  She  was last seen in the office on 06/12/2022 and was stable from a cardiac standpoint.  She denies symptoms concerning for angina. She was maintaining NSR.  She was hospitalized in December 2023 in the setting of acute on chronic diastolic heart failure.  Echocardiogram showed 60 to 65%, normal LV function, no RWMA, G2 DD, normal RV systolic function, mild aortic valve stenosis, mean gradient 11 mmHg.  She was evaluated in the ED in October 2020 for in the setting of shortness of breath.  CT of her chest was unremarkable.  She was last seen in office on 09/23/2023 and reported generalized weakness, worsening shortness of breath.  Repeat echocardiogram showed EF 60 to 65%, normal LV function, no RWMA, mild LVH, moderately reduced RV systolic function, moderately elevated PASP, estimated RVSP 47.3 mmHg, moderately dilated right atrium, mild mitral valve regurgitation, mild to moderate tricuspid valve regurgitation, moderate aortic stenosis, mean gradient 16 mmHg.  Follow-up R/LHC was recommended.  She presents today for follow-up accompanied by her husband.  Since her last visit she has   1. Chronic diastolic heart failure: Echo in 09/2022 showed 60 to 65%, normal LV function, no RWMA, G2 DD, normal RV systolic function, mild aortic valve stenosis, mean gradient 11 mmHg. Euvolemic and well compensated on exam.  Discussed ongoing monitoring with daily weights, sodium and fluid recommendations.  Continue losartan , metoprolol , lasix .    2. CAD: She has three-vessel CAD with CTO-left circumflex and LAD, 99% complex and long RCA stenosis, managed medically. She has stable chronic dyspnea, otherwise stable with no anginal symptoms. No indication for ischemic evaluation.  No ASA in the setting of chronic DOAC therapy.  Continue amlodipine , losartan , metoprolol , imdur  and lipitor.    3. Paroxysmal atrial fibrillation: Maintaining NSR. Continue Eliquis , metoprolol .    4. Aortic stenosis: Mild on most recent echo. Stable.  Consider repeat echo in 1 year or as clinically indicated.    5. Carotid artery stenosis: Carotid dopplers in 2021 showed 1-39% BICA stenosis. No indication for repeat testing at this time.    6. Hypertension: BP well controlled. Continue current antihypertensive regimen.    7. Hyperlipidemia: LDL was 65 in 04/2022. Continue Lipitor.    8. Disposition: Follow-up in  Home Medications    Current Outpatient Medications  Medication Sig Dispense Refill   Acetaminophen  (TYLENOL  PO) Take 1,000 mg by mouth 2 (two) times daily.     amLODipine  (NORVASC ) 5 MG tablet TAKE  1 TABLET(5 MG) BY MOUTH DAILY 90 tablet 3   atorvastatin  (LIPITOR) 40 MG tablet Take 1 tablet (40 mg total) by mouth daily. 90 tablet 3   Calcium  Carbonate-Vitamin D  (CALCIUM  + D PO) Take 600 mg by mouth 2 (two) times daily. Lunch and bedtime     carbamide peroxide (DEBROX) 6.5 % OTIC solution Place 5 drops into the right ear 2 (two) times daily. 15 mL 2   ELIQUIS  2.5 MG TABS tablet TAKE 1 TABLET(2.5 MG) BY MOUTH TWICE DAILY 180 tablet 1   furosemide  (LASIX ) 20 MG tablet TAKE 1 TABLET(20 MG) BY MOUTH DAILY 90 tablet 3   isosorbide  mononitrate (IMDUR ) 30 MG 24 hr tablet Take 30 mg by mouth daily.     levothyroxine  (SYNTHROID ) 88 MCG tablet Take 88 mcg by mouth daily before breakfast.     losartan  (COZAAR ) 100 MG tablet Take 0.5 tablets (50 mg total) by mouth daily. 90 tablet 3   metoprolol  succinate (TOPROL -XL) 100 MG 24 hr tablet Take 100 mg by mouth 2 (two) times daily. Take with or immediately following a meal.     Multiple Vitamins-Minerals (CENTRUM SILVER 50+WOMEN) TABS Take 1 tablet by mouth daily.     nitroGLYCERIN  (NITROSTAT ) 0.4 MG SL tablet For chest pain, tightness, or pressure. While sitting, place 1 tablet under tongue. May be used every 5 minutes as needed, for up to 15 minutes. Do not use more than 3 tablets. 25 tablet 3   olopatadine (PATANOL) 0.1 % ophthalmic solution Place 1 drop into both eyes daily.      Tetrahydrozoline-Zn Sulfate (EYE DROPS A/C OP) Place 1 drop into both eyes every 6 (six) hours as needed (for dry eyes). Systane     No current facility-administered medications for this visit.     Review of Systems    ***.  All other systems reviewed and are otherwise negative except as noted above.    Physical Exam    VS:  There were no vitals taken for this visit. , BMI There is no height or weight on file to calculate BMI.     GEN: Well nourished, well developed, in no acute distress. HEENT: normal. Neck: Supple, no JVD, carotid bruits, or masses. Cardiac: RRR, no murmurs, rubs, or gallops. No clubbing, cyanosis, edema.  Radials/DP/PT 2+ and equal bilaterally.  Respiratory:  Respirations regular and unlabored, clear to auscultation bilaterally. GI: Soft, nontender, nondistended, BS + x 4. MS: no deformity or atrophy. Skin: warm and dry, no rash. Neuro:  Strength and sensation are intact. Psych: Normal affect.  Accessory Clinical Findings    ECG personally reviewed by me today -    - no acute changes.   Lab Results  Component Value Date   WBC 4.8 09/23/2023   HGB 12.9 09/23/2023   HCT 37.4 09/23/2023   MCV 97 09/23/2023   PLT 205 09/23/2023   Lab Results  Component Value Date   CREATININE 0.73 09/24/2023   BUN 14 09/24/2023   NA 121 (L) 09/24/2023   K 4.7 09/24/2023   CL 82 (L) 09/24/2023   CO2 25 09/24/2023   Lab Results  Component Value Date   ALT 19 09/26/2022   AST 36 09/26/2022   ALKPHOS 57 09/26/2022   BILITOT 1.7 (H) 09/26/2022   Lab Results  Component Value Date   CHOL 157 07/04/2015   HDL 79 07/04/2015   LDLCALC 61 07/04/2015   TRIG 84 07/04/2015   CHOLHDL 2.0 07/04/2015    Lab Results  Component Value Date   HGBA1C 6.3 (H) 08/06/2021    Assessment & Plan    1.  ***  No BP recorded.  {Refresh Note OR Click here to enter BP  :1}***   Damien JAYSON Braver, NP 10/11/2023, 11:12 AM

## 2023-10-12 ENCOUNTER — Ambulatory Visit: Payer: Medicare Other | Attending: Nurse Practitioner | Admitting: Nurse Practitioner

## 2023-10-12 ENCOUNTER — Ambulatory Visit: Payer: Medicare Other | Admitting: Nurse Practitioner

## 2023-10-12 ENCOUNTER — Encounter: Payer: Self-pay | Admitting: Nurse Practitioner

## 2023-10-12 VITALS — BP 100/64 | HR 73 | Ht 63.0 in | Wt 118.0 lb

## 2023-10-12 DIAGNOSIS — I10 Essential (primary) hypertension: Secondary | ICD-10-CM | POA: Diagnosis not present

## 2023-10-12 DIAGNOSIS — I350 Nonrheumatic aortic (valve) stenosis: Secondary | ICD-10-CM

## 2023-10-12 DIAGNOSIS — I5033 Acute on chronic diastolic (congestive) heart failure: Secondary | ICD-10-CM

## 2023-10-12 DIAGNOSIS — E785 Hyperlipidemia, unspecified: Secondary | ICD-10-CM

## 2023-10-12 DIAGNOSIS — I272 Pulmonary hypertension, unspecified: Secondary | ICD-10-CM

## 2023-10-12 DIAGNOSIS — I6529 Occlusion and stenosis of unspecified carotid artery: Secondary | ICD-10-CM

## 2023-10-12 DIAGNOSIS — I480 Paroxysmal atrial fibrillation: Secondary | ICD-10-CM

## 2023-10-12 DIAGNOSIS — N182 Chronic kidney disease, stage 2 (mild): Secondary | ICD-10-CM | POA: Diagnosis not present

## 2023-10-12 DIAGNOSIS — I130 Hypertensive heart and chronic kidney disease with heart failure and stage 1 through stage 4 chronic kidney disease, or unspecified chronic kidney disease: Secondary | ICD-10-CM | POA: Diagnosis not present

## 2023-10-12 DIAGNOSIS — I5032 Chronic diastolic (congestive) heart failure: Secondary | ICD-10-CM

## 2023-10-12 DIAGNOSIS — I2510 Atherosclerotic heart disease of native coronary artery without angina pectoris: Secondary | ICD-10-CM

## 2023-10-12 NOTE — Progress Notes (Signed)
 Office Visit    Patient Name: [name] Date of Encounter: 10/12/2023  Primary Care Provider:  Nichole Senior, MD Primary Cardiologist:  Lynwood Schilling, MD  Chief Complaint    88 year old female with a history of significant three-vessel CAD with CTO-left circumflex and LAD, 99% complex and long RCA stenosis, managed medically, paroxysmal atrial fibrillation, PVCs, chronic diastolic heart failure, aortic stenosis, hypertension, hyperlipidemia, carotid artery stenosis, macular degeneration and rheumatoid arthritis who presents for follow-up related to CAD and heart failure.  Past Medical History    Past Medical History:  Diagnosis Date   Aortic stenosis    Atrial fibrillation (HCC)    CAD, UNSPECIFIED SITE    2009. 99% proximal RCA stenosis followed by 80% distal stenosis. The LAD has a 100% stenosis in the midsegment. The circumflex is patent with distal occlusion. She has been managed medically. Her EF was well-preserved.   CAROTID STENOSIS    Chronic diastolic (congestive) heart failure (HCC)    DIVERTICULAR DISEASE    Elevated troponin- 0.58 in setting of PSVT 10/29/2013   Essential hypertension, benign    Gastritis    H/O blood clots 1984   DVT and PE   Hx of degenerative disc disease    HYPERLIPIDEMIA    MACULAR DEGENERATION    OSTEOPOROSIS    Rheumatoid arthritis(714.0)    Thyroid  cancer (HCC) 2008   Past Surgical History:  Procedure Laterality Date   ABDOMINAL HYSTERECTOMY  1984   APPENDECTOMY  1959   BREAST CYST EXCISION     left   CARDIOVERSION N/A 08/13/2021   Procedure: CARDIOVERSION;  Surgeon: Raford Riggs, MD;  Location: [facility] ENDOSCOPY;  Service: Cardiovascular;  Laterality: N/A;   CATARACT EXTRACTION, BILATERAL     CHOLECYSTECTOMY  2009   COLONOSCOPY     ESOPHAGOGASTRODUODENOSCOPY     IM NAILING FEMORAL SHAFT RETROGRADE  2010   left   TONSILLECTOMY     WRIST SURGERY  2004   Left    Allergies  Allergies  Allergen Reactions   Hydrocodone  Other (See Comments)    feels weird   Prednisone Nausea And Vomiting    Oral     Labs/Other Studies Reviewed    The following studies were reviewed today:  Cardiac Studies & Procedures     STRESS TESTS  EXERCISE TOLERANCE TEST (ETT) 08/06/2015  Narrative  Blood pressure demonstrated a hypertensive response to exercise.  There was no ST segment deviation noted during stress.  ETT with poor exercise tolerance (4:30); no chest pain; hypertensive BP response; no ST changes; negative adequate ETT.  ECHOCARDIOGRAM  ECHOCARDIOGRAM COMPLETE 09/24/2023  Narrative ECHOCARDIOGRAM REPORT    Patient Name:   [name] Date of Exam: 09/24/2023 Medical Rec #:  [id number]       Height:       63.0 in Accession #:    [id number]      Weight:       121.0 lb Date of Birth:  [birth year]      BSA:          1.562 m Patient Age:    87 years        BP:           120/75 mmHg Patient Gender: F               HR:           78 bpm. Exam Location:  [address]  Procedure: 2D Echo, Color Doppler, Cardiac Doppler and 3D  Echo  Indications:    Acute dyspnea R06.02  History:        Patient has prior history of Echocardiogram examinations, most recent 09/25/2022. CHF, CAD, Arrythmias:Atrial Fibrillation; Risk Factors:Hypertension and Dyslipidemia.  Sonographer:    Augustin Seals RDCS Referring Phys: [id number] JAMES HOCHREIN  IMPRESSIONS   1. Left ventricular ejection fraction, by estimation, is 60 to 65%. Left ventricular ejection fraction by 3D volume is 62 %. The left ventricle has normal function. The left ventricle has no regional wall motion abnormalities. There is mild left ventricular hypertrophy. Left ventricular diastolic function could not be evaluated. 2. Right ventricular systolic function is moderately reduced. The right ventricular size is normal. There is moderately elevated pulmonary artery systolic pressure. The estimated right ventricular systolic pressure is 47.3 mmHg. 3.  Right atrial size was moderately dilated. 4. The mitral valve is abnormal. Mild mitral valve regurgitation. 5. Tricuspid valve regurgitation is mild to moderate. 6. The aortic valve is moderately calcified. Aortic valve regurgitation is not visualized. Suspect low gradient moderate aortic valve stenosis. Aortic valve area, by VTI measures 0.79 cm (based on an LVOT diameter of 1.8 cm). Aortic valve mean gradient measures 16.0 mmHg. Aortic valve Vmax measures 2.60 m/s. Peak gradient 26.9 mmHg, DI 0.31. 7. The inferior vena cava is dilated in size with <50% respiratory variability, suggesting right atrial pressure of 15 mmHg. 8. Rhythm strip during this exam demonstrates atrial fibrillation.  Comparison(s): Changes from prior study are noted. 09/25/2022: LVEF 60-65%, mild AS - mean gradient 11 mmHg.  FINDINGS Left Ventricle: Left ventricular ejection fraction, by estimation, is 60 to 65%. Left ventricular ejection fraction by 3D volume is 62 %. The left ventricle has normal function. The left ventricle has no regional wall motion abnormalities. The left ventricular internal cavity size was normal in size. There is mild left ventricular hypertrophy. Left ventricular diastolic function could not be evaluated due to atrial fibrillation. Left ventricular diastolic function could not be evaluated.  Right Ventricle: The right ventricular size is normal. No increase in right ventricular wall thickness. Right ventricular systolic function is moderately reduced. There is moderately elevated pulmonary artery systolic pressure. The tricuspid regurgitant velocity is 2.84 m/s, and with an assumed right atrial pressure of 15 mmHg, the estimated right ventricular systolic pressure is 47.3 mmHg.  Left Atrium: Left atrial size was normal in size.  Right Atrium: Right atrial size was moderately dilated.  Pericardium: There is no evidence of pericardial effusion.  Mitral Valve: The mitral valve is abnormal. There  is mild thickening of the mitral valve leaflet(s). There is mild calcification of the mitral valve leaflet(s). Mild mitral valve regurgitation.  Tricuspid Valve: The tricuspid valve is grossly normal. Tricuspid valve regurgitation is mild to moderate.  Aortic Valve: The aortic valve is calcified. There is moderate calcification of the aortic valve. Aortic valve regurgitation is not visualized. Moderate aortic stenosis is present. Aortic valve mean gradient measures 16.0 mmHg. Aortic valve peak gradient measures 26.9 mmHg. Aortic valve area, by VTI measures 0.79 cm.  Pulmonic Valve: The pulmonic valve was grossly normal. Pulmonic valve regurgitation is trivial.  Aorta: The aortic root and ascending aorta are structurally normal, with no evidence of dilitation.  Venous: The inferior vena cava is dilated in size with less than 50% respiratory variability, suggesting right atrial pressure of 15 mmHg.  IAS/Shunts: No atrial level shunt detected by color flow Doppler.  EKG: Rhythm strip during this exam demonstrates atrial fibrillation.   LEFT VENTRICLE PLAX 2D LVIDd:  3.20 cm LVIDs:         2.00 cm LV PW:         1.00 cm         3D Volume EF LV IVS:        1.10 cm         LV 3D EF:    Left LVOT diam:     1.80 cm                      ventricul LV SV:         41                           ar LV SV Index:   26                           ejection LVOT Area:     2.54 cm                     fraction by 3D volume is 62 %.  3D Volume EF: 3D EF:        62 % LV EDV:       67 ml LV ESV:       26 ml LV SV:        41 ml  RIGHT VENTRICLE          IVC RV Basal diam:  2.60 cm  IVC diam: 2.40 cm  LEFT ATRIUM             Index        RIGHT ATRIUM           Index LA diam:        3.20 cm 2.05 cm/m   RA Area:     23.00 cm LA Vol (A2C):   47.6 ml 30.48 ml/m  RA Volume:   60.10 ml  38.48 ml/m LA Vol (A4C):   33.5 ml 21.45 ml/m LA Biplane Vol: 40.8 ml 26.12 ml/m AORTIC VALVE AV Area  (Vmax):    0.82 cm AV Area (Vmean):   0.87 cm AV Area (VTI):     0.79 cm AV Vmax:           259.50 cm/s AV Vmean:          170.750 cm/s AV VTI:            0.520 m AV Peak Grad:      26.9 mmHg AV Mean Grad:      16.0 mmHg LVOT Vmax:         83.40 cm/s LVOT Vmean:        58.300 cm/s LVOT VTI:          0.161 m LVOT/AV VTI ratio: 0.31  AORTA Ao Root diam: 3.00 cm Ao Asc diam:  3.20 cm  MR Peak grad: 77.1 mmHg   TRICUSPID VALVE MR Mean grad: 51.0 mmHg   TR Peak grad:   32.3 mmHg MR Vmax:      439.00 cm/s TR Vmax:        284.00 cm/s MR Vmean:     343.0 cm/s SHUNTS Systemic VTI:  0.16 m Systemic Diam: 1.80 cm  Vinie Maxcy MD Electronically signed by Vinie Maxcy MD Signature Date/Time: 09/24/2023/12:58:17 PM    Final   MONITORS  LONG TERM MONITOR (3-14 DAYS) 07/22/2021  Narrative Atrial fib with controlled  ventricular rate. Averate rate was 76 bpm          Recent Labs: 07/09/2023: B Natriuretic Peptide 519.2 09/23/2023: Hemoglobin 12.9; Platelets 205; TSH 0.471 09/24/2023: BUN 14; Creatinine, Ser 0.73; Potassium 4.7; Sodium 121  Recent Lipid Panel    Component Value Date/Time   CHOL 157 07/04/2015 0856   TRIG 84 07/04/2015 0856   HDL 79 07/04/2015 0856   CHOLHDL 2.0 07/04/2015 0856   VLDL 17 07/04/2015 0856   LDLCALC 61 07/04/2015 0856    History of Present Illness    88 year old female with the above past medical history including significant three-vessel CAD with CTO-left circumflex and LAD, 99% complex and long RCA stenosis, managed medically, paroxysmal atrial fibrillation, PVCs, chronic diastolic heart failure, aortic stenosis, hypertension, hyperlipidemia, carotid artery stenosis, macular degeneration and rheumatoid arthritis.   She was diagnosed with atrial fibrillation in December 2019 in the setting of acute bronchitis.  She has been stable on Eliquis  and metoprolol .  She has a history of extensive CAD as above, managed medically.  She was last seen  in the office on 06/12/2022 and was stable from a cardiac standpoint.  She denies symptoms concerning for angina. She was maintaining NSR.  She was hospitalized in December 2023 in the setting of acute on chronic diastolic heart failure.  Echocardiogram showed 60 to 65%, normal LV function, no RWMA, G2 DD, normal RV systolic function, mild aortic valve stenosis, mean gradient 11 mmHg.  She was evaluated in the ED in October 2020 for in the setting of shortness of breath.  CT of her chest was unremarkable.  She was last seen in office on 09/23/2023 and reported generalized weakness, worsening shortness of breath.  Repeat echocardiogram showed EF 60 to 65%, normal LV function, no RWMA, mild LVH, moderately reduced RV systolic function, moderately elevated PASP, estimated RVSP 47.3 mmHg, moderately dilated right atrium, mild mitral valve regurgitation, mild to moderate tricuspid valve regurgitation, moderate aortic stenosis, mean gradient 16 mmHg.  Follow-up R/LHC was recommended.  She presents today for follow-up accompanied by her husband.  Since her last visit she has been stable overall from a cardiac standpoint.  She saw her PCP earlier today who did a chest x-ray which showed a small right pleural effusion. She was advised to increase her Lasix  to 40 mg daily. She has been experiencing low sodium levels. She continues to note shortness of breath at rest with activity, orthopnea, she denies any significant weight gain. Her oxygen  saturation has been stable at home with O2 sats in the mid 90s consistently.  She has mild nonpitting bilateral lower extremity edema, improved with compression and elevation. She denies chest pain, palpitations, dizziness, presyncope or syncope.  Home Medications    Current Outpatient Medications  Medication Sig Dispense Refill   Acetaminophen  (TYLENOL  PO) Take 1,000 mg by mouth 2 (two) times daily.     amLODipine  (NORVASC ) 5 MG tablet TAKE 1 TABLET(5 MG) BY MOUTH DAILY 90 tablet  3   atorvastatin  (LIPITOR) 40 MG tablet Take 1 tablet (40 mg total) by mouth daily. 90 tablet 3   Calcium  Carbonate-Vitamin D  (CALCIUM  + D PO) Take 600 mg by mouth 2 (two) times daily. Lunch and bedtime     carbamide peroxide (DEBROX) 6.5 % OTIC solution Place 5 drops into the right ear 2 (two) times daily. 15 mL 2   ELIQUIS  2.5 MG TABS tablet TAKE 1 TABLET(2.5 MG) BY MOUTH TWICE DAILY 180 tablet 1   furosemide  (LASIX ) 20 MG  tablet TAKE 1 TABLET(20 MG) BY MOUTH DAILY 90 tablet 3   isosorbide  mononitrate (IMDUR ) 30 MG 24 hr tablet Take 30 mg by mouth daily.     levothyroxine  (SYNTHROID ) 88 MCG tablet Take 88 mcg by mouth daily before breakfast.     metoprolol  succinate (TOPROL -XL) 100 MG 24 hr tablet Take 100 mg by mouth 2 (two) times daily. Take with or immediately following a meal.     Multiple Vitamins-Minerals (CENTRUM SILVER 50+WOMEN) TABS Take 1 tablet by mouth daily.     nitroGLYCERIN  (NITROSTAT ) 0.4 MG SL tablet For chest pain, tightness, or pressure. While sitting, place 1 tablet under tongue. May be used every 5 minutes as needed, for up to 15 minutes. Do not use more than 3 tablets. 25 tablet 3   olopatadine (PATANOL) 0.1 % ophthalmic solution Place 1 drop into both eyes daily.     Tetrahydrozoline-Zn Sulfate (EYE DROPS A/C OP) Place 1 drop into both eyes every 6 (six) hours as needed (for dry eyes). Systane     losartan  (COZAAR ) 100 MG tablet Take 0.5 tablets (50 mg total) by mouth daily. 90 tablet 3   No current facility-administered medications for this visit.     Review of Systems    She denies chest pain, palpitations, pnd, orthopnea, n, v, dizziness, syncope, weight gain, or early satiety. All other systems reviewed and are otherwise negative except as noted above.   Physical Exam    VS:  BP 100/64   Pulse 73   Ht 5' 3 (1.6 m)   Wt 118 lb (53.5 kg)   SpO2 95%   BMI 20.90 kg/m  GEN: Well nourished, well developed, in no acute distress. HEENT: normal. Neck: Supple, no  JVD, carotid bruits, or masses. Cardiac: IRIR, no murmurs, rubs, or gallops. No clubbing, cyanosis, edema.  Radials/DP/PT 2+ and equal bilaterally.  Respiratory:  Respirations regular and unlabored, clear to auscultation bilaterally. GI: Soft, nontender, nondistended, BS + x 4. MS: no deformity or atrophy. Skin: warm and dry, no rash. Neuro:  Strength and sensation are intact. Psych: Normal affect.  Accessory Clinical Findings    ECG personally reviewed by me today - EKG Interpretation Date/Time:  Tuesday October 12 2023 14:40:52 EST Ventricular Rate:  73 PR Interval:    QRS Duration:  52 QT Interval:  358 QTC Calculation: 394 R Axis:   61  Text Interpretation: Atrial fibrillation Low voltage QRS When compared with ECG of 09-Jul-2023 13:30, PREVIOUS ECG IS PRESENT Confirmed by Daneen Perkins ([id number]) on 10/12/2023 2:44:32 PM  - no acute changes.   Lab Results  Component Value Date   WBC 4.8 09/23/2023   HGB 12.9 09/23/2023   HCT 37.4 09/23/2023   MCV 97 09/23/2023   PLT 205 09/23/2023   Lab Results  Component Value Date   CREATININE 0.73 09/24/2023   BUN 14 09/24/2023   NA 121 (L) 09/24/2023   K 4.7 09/24/2023   CL 82 (L) 09/24/2023   CO2 25 09/24/2023   Lab Results  Component Value Date   ALT 19 09/26/2022   AST 36 09/26/2022   ALKPHOS 57 09/26/2022   BILITOT 1.7 (H) 09/26/2022   Lab Results  Component Value Date   CHOL 157 07/04/2015   HDL 79 07/04/2015   LDLCALC 61 07/04/2015   TRIG 84 07/04/2015   CHOLHDL 2.0 07/04/2015    Lab Results  Component Value Date   HGBA1C 6.3 (H) 08/06/2021    Assessment & Plan  1. Chronic diastolic heart failure/elevated PASP/CAD/ shortness of breath: She has three-vessel CAD with CTO-left circumflex and LAD, 99% complex and long RCA stenosis, managed medically. Echo in 09/2022 showed 60 to 65%, normal LV function, no RWMA, G2 DD, normal RV systolic function, mild aortic valve stenosis, mean gradient 11 mmHg. Euvolemic and well  compensated on exam.  She has had progressive shortness of breath.  Repeat echo in 09/2023 showed EF 60 to 65%, normal LV function, no RWMA, mild LVH, moderately reduced RV systolic function, moderately elevated PASP, estimated RVSP 47.3 mmHg, moderately dilated right atrium, mild mitral valve regurgitation, mild to moderate tricuspid valve regurgitation, moderate aortic stenosis, mean gradient 16 mmHg.  Follow-up R/LHC was recommended per Dr. Lavona. We discussed the procedure in detail today. However, patient states she would like to discuss this further with Dr. Lavona before agreeing to proceed.  Will arrange for close follow-up with Dr. Lavona.  She had a chest x-ray with her PCP that showed small right pleural effusion.  She is maintaining her oxygen  saturation.  Will increase Lasix  to 40 mg daily. Discussed ongoing monitoring with daily weights, sodium and fluid recommendations.  Continue amlodipine , losartan , metoprolol , Imdur , Lasix  and lipitor. No ASA in the setting of chronic DOAC therapy.   2. Paroxysmal atrial fibrillation: Rate controlled. Continue Eliquis , metoprolol .    3. Aortic stenosis: Moderate on most recent echo, mean gradient 16 mmHg. Consider repeat echo in 09/2024, sooner if clinically indicated.    5. Carotid artery stenosis: Carotid dopplers in 2021 showed 1-39% BICA stenosis. Asymptomatic. No indication for repeat testing at this time. Continue statin.    6. Hypertension: BP well controlled, borderline low.  Continue to monitor BP with increased Lasix  dosing. Continue current antihypertensive regimen.    7. Hyperlipidemia: LDL was 61 in 04/2023. Continue Lipitor.    8. Disposition: Follow-up with Dr. Lavona on 10/15/2023.       Damien JAYSON Braver, NP 10/12/2023, 6:00 PM

## 2023-10-12 NOTE — Patient Instructions (Signed)
 Medication Instructions:  Lasix  (Furosemide ) 40 mg twice daily.   *If you need a refill on your cardiac medications before your next appointment, please call your pharmacy*   Lab Work: NONE ordered at this time of appointment   Testing/Procedures: NONE ordered at this time of appointment    Follow-Up: At [facility], you and your health needs are our priority.  As part of our continuing mission to provide you with exceptional heart care, we have created designated Provider Care Teams.  These Care Teams include your primary Cardiologist (physician) and Advanced Practice Providers (APPs -  Physician Assistants and Nurse Practitioners) who all work together to provide you with the care you need, when you need it.  We recommend signing up for the patient portal called MyChart.  Sign up information is provided on this After Visit Summary.  MyChart is used to connect with patients for Virtual Visits (Telemedicine).  Patients are able to view lab/test results, encounter notes, upcoming appointments, etc.  Non-urgent messages can be sent to your provider as well.   To learn more about what you can do with MyChart, go to [email or web address].    Your next appointment:    Keep apt   Provider:   Lynwood Schilling, MD     Other Instructions

## 2023-10-15 ENCOUNTER — Ambulatory Visit: Payer: Medicare Other | Admitting: Cardiology

## 2023-10-27 NOTE — H&P (View-Only) (Signed)
Cardiology Office Note:   Date:  10/29/2023  ID:  [name], DOB 02-03-1936, MRN [id number] PCP: Adrian Prince, MD  [facility] Providers Cardiologist:  Rollene Rotunda, MD {  History of Present Illness:   [name] is a 88 y.o. female who presents for follow up of CAD and atrial fibrillation.  She was admitted to the hospital in December 2019 with bronchitis.  She had an elevated troponin which was thought to be demand ischemia.  She was in new onset atrial fib.  She was hospitalized in December 2023 with acute diastolic heart failure.     She had an echo in December and she had a normal EF.  There was moderate AS but it was suggested that this could be low flow.    I was going to send her for right and left heart cath but she wanted to talk about this first.  She continues to have fatigue and SOB.  She is being seen by Dr. Evlyn Kanner for follow up of hyponatremia.    She continues to have shortness of breath.  She is short of breath going to any activity.  She is very weak.  Previously her oxygen saturations her in the 90% range walking around the office although she was still short of breath.  She is getting around for the most part in a wheelchair she has no any distance.  She has lower extremity swelling and she is wearing compression stockings.  She tries to restrict her salt and fluid  ROS: As stated in the HPI and negative for all other systems.  Studies Reviewed:    EKG:     Risk Assessment/Calculations:    CHA2DS2-VASc Score = 5   This indicates a 7.2% annual risk of stroke. The patient's score is based upon: CHF History: 0 HTN History: 1 Diabetes History: 0 Stroke History: 0 Vascular Disease History: 1 Age Score: 2 Gender Score: 1       Physical Exam:   VS:  BP 110/62   Pulse 83   Ht 5\' 3"  (1.6 m)   Wt 117 lb (53.1 kg)   SpO2 93%   BMI 20.73 kg/m    Wt Readings from Last 3 Encounters:  10/29/23 117 lb (53.1 kg)  10/12/23 118 lb (53.5 kg)   09/23/23 121 lb (54.9 kg)     GEN: Frail appearing NECK: No JVD; No carotid bruits CARDIAC: Irregular RR, 2 out of 6 apical systolic murmur radiating at the right upper tract no diastolic murmurs, rubs, gallops RESPIRATORY:  Clear to auscultation without rales, wheezing or rhonchi  ABDOMEN: Soft, non-tender, non-distended EXTREMITIES: Mild leg edema; No deformity   ASSESSMENT AND PLAN:   CAD:    The patient has progressive dyspnea and decreased exercise tolerance which is likely multifactorial and probably in part related to aging.  There might be also a systemic process with her hyponatremia.  However, get further information I am going to need to get right heart pressures, assess her aortic valve and I like to exclude obstructive coronary disease.  I will order a right and left heart cath and she agrees to proceed.  The patient understands that risks included but are not limited to stroke (1 in 1000), death (1 in 1000), kidney failure [usually temporary] (1 in 500), bleeding (1 in 200), allergic reaction [possibly serious] (1 in 200).  The patient understands and agrees to proceed.   ATRIAL FIB:    Ms. TATELYN VANHECKE has a  CHA2DS2 - VASc score of 5.  She is in chronic atrial fibrillation on rate control.  She will need to hold her anticoagulation for couple of days prior to the procedure.    HTN:   Her blood pressure is at target.  No change in therapy.   CHRONIC DIASTOLIC HF: Probably has some component of acute on chronic diastolic heart failure causing her dyspnea.  We are treating her with low-dose diuretics and restricting salt and fluid.   SOB: If there is no forthcoming data it is helpful on the right and left heart cath I would consider pulmonary function testing.  AORTIC STENOSIS: This was mod in December.  No change in therapy.  See discussion above.    HYPONATREMIA:  I have a phone call into Dr. Evlyn Kanner to further discuss this.  She had a workup in the hospital previously in 2019  and there was no clear etiology.  She has been hyponatremic for quite a while and certainly her sodium has been lower recently which could contribute to her symptoms.  I will defer further workup to Dr. Evlyn Kanner.    Follow up with me after the cath.    Signed, Rollene Rotunda, MD

## 2023-10-27 NOTE — Progress Notes (Signed)
 Cardiology Office Note:   Date:  10/29/2023  ID:  [name], DOB 02-03-1936, MRN [id number] PCP: Adrian Prince, MD  [facility] Providers Cardiologist:  Rollene Rotunda, MD {  History of Present Illness:   [name] is a 88 y.o. female who presents for follow up of CAD and atrial fibrillation.  She was admitted to the hospital in December 2019 with bronchitis.  She had an elevated troponin which was thought to be demand ischemia.  She was in new onset atrial fib.  She was hospitalized in December 2023 with acute diastolic heart failure.     She had an echo in December and she had a normal EF.  There was moderate AS but it was suggested that this could be low flow.    I was going to send her for right and left heart cath but she wanted to talk about this first.  She continues to have fatigue and SOB.  She is being seen by Dr. Evlyn Kanner for follow up of hyponatremia.    She continues to have shortness of breath.  She is short of breath going to any activity.  She is very weak.  Previously her oxygen saturations her in the 90% range walking around the office although she was still short of breath.  She is getting around for the most part in a wheelchair she has no any distance.  She has lower extremity swelling and she is wearing compression stockings.  She tries to restrict her salt and fluid  ROS: As stated in the HPI and negative for all other systems.  Studies Reviewed:    EKG:     Risk Assessment/Calculations:    CHA2DS2-VASc Score = 5   This indicates a 7.2% annual risk of stroke. The patient's score is based upon: CHF History: 0 HTN History: 1 Diabetes History: 0 Stroke History: 0 Vascular Disease History: 1 Age Score: 2 Gender Score: 1       Physical Exam:   VS:  BP 110/62   Pulse 83   Ht 5\' 3"  (1.6 m)   Wt 117 lb (53.1 kg)   SpO2 93%   BMI 20.73 kg/m    Wt Readings from Last 3 Encounters:  10/29/23 117 lb (53.1 kg)  10/12/23 118 lb (53.5 kg)   09/23/23 121 lb (54.9 kg)     GEN: Frail appearing NECK: No JVD; No carotid bruits CARDIAC: Irregular RR, 2 out of 6 apical systolic murmur radiating at the right upper tract no diastolic murmurs, rubs, gallops RESPIRATORY:  Clear to auscultation without rales, wheezing or rhonchi  ABDOMEN: Soft, non-tender, non-distended EXTREMITIES: Mild leg edema; No deformity   ASSESSMENT AND PLAN:   CAD:    The patient has progressive dyspnea and decreased exercise tolerance which is likely multifactorial and probably in part related to aging.  There might be also a systemic process with her hyponatremia.  However, get further information I am going to need to get right heart pressures, assess her aortic valve and I like to exclude obstructive coronary disease.  I will order a right and left heart cath and she agrees to proceed.  The patient understands that risks included but are not limited to stroke (1 in 1000), death (1 in 1000), kidney failure [usually temporary] (1 in 500), bleeding (1 in 200), allergic reaction [possibly serious] (1 in 200).  The patient understands and agrees to proceed.   ATRIAL FIB:    [name] has a  CHA2DS2 - VASc score of 5.  She is in chronic atrial fibrillation on rate control.  She will need to hold her anticoagulation for couple of days prior to the procedure.    HTN:   Her blood pressure is at target.  No change in therapy.   CHRONIC DIASTOLIC HF: Probably has some component of acute on chronic diastolic heart failure causing her dyspnea.  We are treating her with low-dose diuretics and restricting salt and fluid.   SOB: If there is no forthcoming data it is helpful on the right and left heart cath I would consider pulmonary function testing.  AORTIC STENOSIS: This was mod in December.  No change in therapy.  See discussion above.    HYPONATREMIA:  I have a phone call into Dr. Evlyn Kanner to further discuss this.  She had a workup in the hospital previously in 2019  and there was no clear etiology.  She has been hyponatremic for quite a while and certainly her sodium has been lower recently which could contribute to her symptoms.  I will defer further workup to Dr. Evlyn Kanner.    Follow up with me after the cath.    Signed, Rollene Rotunda, MD

## 2023-10-28 DIAGNOSIS — R7303 Prediabetes: Secondary | ICD-10-CM | POA: Diagnosis not present

## 2023-10-28 DIAGNOSIS — N182 Chronic kidney disease, stage 2 (mild): Secondary | ICD-10-CM | POA: Diagnosis not present

## 2023-10-28 DIAGNOSIS — I130 Hypertensive heart and chronic kidney disease with heart failure and stage 1 through stage 4 chronic kidney disease, or unspecified chronic kidney disease: Secondary | ICD-10-CM | POA: Diagnosis not present

## 2023-10-28 DIAGNOSIS — I10 Essential (primary) hypertension: Secondary | ICD-10-CM | POA: Diagnosis not present

## 2023-10-28 DIAGNOSIS — E871 Hypo-osmolality and hyponatremia: Secondary | ICD-10-CM | POA: Diagnosis not present

## 2023-10-28 DIAGNOSIS — M069 Rheumatoid arthritis, unspecified: Secondary | ICD-10-CM | POA: Diagnosis not present

## 2023-10-29 ENCOUNTER — Ambulatory Visit: Payer: Medicare Other | Attending: Cardiology | Admitting: Cardiology

## 2023-10-29 ENCOUNTER — Encounter: Payer: Self-pay | Admitting: Cardiology

## 2023-10-29 VITALS — BP 110/62 | HR 83 | Ht 63.0 in | Wt 117.0 lb

## 2023-10-29 DIAGNOSIS — I5032 Chronic diastolic (congestive) heart failure: Secondary | ICD-10-CM

## 2023-10-29 DIAGNOSIS — I10 Essential (primary) hypertension: Secondary | ICD-10-CM

## 2023-10-29 DIAGNOSIS — R0602 Shortness of breath: Secondary | ICD-10-CM

## 2023-10-29 DIAGNOSIS — I480 Paroxysmal atrial fibrillation: Secondary | ICD-10-CM

## 2023-10-29 DIAGNOSIS — I350 Nonrheumatic aortic (valve) stenosis: Secondary | ICD-10-CM

## 2023-10-29 DIAGNOSIS — I2510 Atherosclerotic heart disease of native coronary artery without angina pectoris: Secondary | ICD-10-CM | POA: Diagnosis not present

## 2023-10-29 NOTE — Patient Instructions (Signed)
Medication Instructions:  No changes.  *If you need a refill on your cardiac medications before your next appointment, please call your pharmacy*    Testing/Procedures:      Cardiac Catheterization   You are scheduled for a Cardiac Catheterization on Wednesday, January 29 with Dr. Peter Swaziland.  1. Please arrive at the [facility] (Main Entrance A) at [facility]: [address] [place], [place] [address] at 5:30 AM (This time is 2 hour(s) before your procedure to ensure your preparation).   Free valet parking service is available. You will check in at ADMITTING. The support person will be asked to wait in the waiting room.  It is OK to have someone drop you off and come back when you are ready to be discharged.        Special note: Every effort is made to have your procedure done on time. Please understand that emergencies sometimes delay scheduled procedures.  2. Diet: Do not eat solid foods after midnight.  You may have clear liquids until 5 AM the day of the procedure.  3. Labs: You will need to have blood drawn today  4. Medication instructions in preparation for your procedure:   DO NOT TAKE FUROSEMIDE THE MORNING OF THE PROCEDURE  On the morning of your procedure, take any morning medicines NOT listed above.  You may use sips of water.  5. Plan to go home the same day, you will only stay overnight if medically necessary. 6. You MUST have a responsible adult to drive you home. 7. An adult MUST be with you the first 24 hours after you arrive home. 8. Bring a current list of your medications, and the last time and date medication taken. 9. Bring ID and current insurance cards. 10.Please wear clothes that are easy to get on and off and wear slip-on shoes.  Thank you for allowing [place] to care for you!   -- [facility]    Follow-Up: At [facility], you and your health needs are our priority.  As part of our continuing  mission to provide you with exceptional heart care, we have created designated Provider Care Teams.  These Care Teams include your primary Cardiologist (physician) and Advanced Practice Providers (APPs -  Physician Assistants and Nurse Practitioners) who all work together to provide you with the care you need, when you need it.  We recommend signing up for the patient portal called "MyChart".  Sign up information is provided on this After Visit Summary.  MyChart is used to connect with patients for Virtual Visits (Telemedicine).  Patients are able to view lab/test results, encounter notes, upcoming appointments, etc.  Non-urgent messages can be sent to your provider as well.   To learn more about what you can do with MyChart, go to [email or web address].    Your next appointment:   3 month(s)  Provider:   Bernadene Person, NP

## 2023-10-30 LAB — CBC
Hematocrit: 36.8 % (ref 34.0–46.6)
Hemoglobin: 12.4 g/dL (ref 11.1–15.9)
MCH: 33.1 pg — ABNORMAL HIGH (ref 26.6–33.0)
MCHC: 33.7 g/dL (ref 31.5–35.7)
MCV: 98 fL — ABNORMAL HIGH (ref 79–97)
Platelets: 219 10*3/uL (ref 150–450)
RBC: 3.75 x10E6/uL — ABNORMAL LOW (ref 3.77–5.28)
RDW: 11.9 % (ref 11.7–15.4)
WBC: 4.6 10*3/uL (ref 3.4–10.8)

## 2023-10-30 LAB — BASIC METABOLIC PANEL
BUN/Creatinine Ratio: 25 (ref 12–28)
BUN: 21 mg/dL (ref 8–27)
CO2: 28 mmol/L (ref 20–29)
Calcium: 8.9 mg/dL (ref 8.7–10.3)
Chloride: 91 mmol/L — ABNORMAL LOW (ref 96–106)
Creatinine, Ser: 0.85 mg/dL (ref 0.57–1.00)
Glucose: 132 mg/dL — ABNORMAL HIGH (ref 70–99)
Potassium: 5.6 mmol/L — ABNORMAL HIGH (ref 3.5–5.2)
Sodium: 134 mmol/L (ref 134–144)
eGFR: 67 mL/min/{1.73_m2} (ref >59)

## 2023-11-01 ENCOUNTER — Other Ambulatory Visit: Payer: Self-pay

## 2023-11-01 ENCOUNTER — Telehealth: Payer: Self-pay | Admitting: Cardiology

## 2023-11-01 NOTE — Telephone Encounter (Signed)
Told patient not to wear her compression stockings- so they can visualize her extremities. She can always bring them with her just in case, but do not wear them there. She verbalized understanding.

## 2023-11-01 NOTE — Telephone Encounter (Signed)
Patient wears compression socks and would like to know if she should wear them the day of her procedure. Please advise.

## 2023-11-02 ENCOUNTER — Telehealth: Payer: Self-pay | Admitting: *Deleted

## 2023-11-02 NOTE — Telephone Encounter (Signed)
R/LHC originally scheduled 11/03/23 with Dr Swaziland. Patient reports she was not given instructions for holding Eliquis prior to cath, she took Eliquis this morning at 7:30 AM. Secure Chat from Dr Swaziland 11/02/23: "I think that since it is an elective procedure she should hold Eliquis for 48 hours prior to cath. this should be rescheduled.'  Cardiac Cath has been rescheduled to 11/04/23 with Dr Lynnette Caffey, instructions reviewed with patient. Patient knows she should not take any more Eliquis until after procedure 11/04/23.

## 2023-11-02 NOTE — Telephone Encounter (Signed)
Cardiac Catheterization scheduled at [facility] for: Thursday November 04, 2023 12 Noon Arrival time [facility] Main Entrance A at: 9:30 AM-needs BMP  Nothing to eat after midnight prior to procedure, clear liquids until 5 AM day of procedure.  Medication instructions: -Hold:  Eliquis-pt reports last dose 11/02/23 at 7:30 AM-knows to hold until post procedure  Lasix-AM of procedure -Other usual morning medications can be taken with sips of water including aspirin 81 mg.  Plan to go home the same day, you will only stay overnight if medically necessary.  You must have responsible adult to drive you home.  Someone must be with you the first 24 hours after you arrive home.  Reviewed procedure instructions with patient.

## 2023-11-03 DIAGNOSIS — I2510 Atherosclerotic heart disease of native coronary artery without angina pectoris: Secondary | ICD-10-CM

## 2023-11-04 ENCOUNTER — Other Ambulatory Visit: Payer: Self-pay

## 2023-11-04 ENCOUNTER — Encounter (HOSPITAL_COMMUNITY)
Admission: RE | Disposition: A | Discharge status: Home / Self Care | Payer: Self-pay | Source: Home / Self Care | Attending: Internal Medicine

## 2023-11-04 ENCOUNTER — Ambulatory Visit (HOSPITAL_COMMUNITY)
Admission: RE | Admit: 2023-11-04 | Discharge: 2023-11-04 | Disposition: A | Discharge status: Home / Self Care | Payer: Medicare Other | Attending: Internal Medicine | Admitting: Internal Medicine

## 2023-11-04 DIAGNOSIS — R0602 Shortness of breath: Secondary | ICD-10-CM | POA: Diagnosis not present

## 2023-11-04 DIAGNOSIS — Z79899 Other long term (current) drug therapy: Secondary | ICD-10-CM | POA: Diagnosis not present

## 2023-11-04 DIAGNOSIS — I482 Chronic atrial fibrillation, unspecified: Secondary | ICD-10-CM | POA: Diagnosis not present

## 2023-11-04 DIAGNOSIS — I5032 Chronic diastolic (congestive) heart failure: Secondary | ICD-10-CM | POA: Insufficient documentation

## 2023-11-04 DIAGNOSIS — I2582 Chronic total occlusion of coronary artery: Secondary | ICD-10-CM | POA: Diagnosis not present

## 2023-11-04 DIAGNOSIS — I350 Nonrheumatic aortic (valve) stenosis: Secondary | ICD-10-CM | POA: Diagnosis not present

## 2023-11-04 DIAGNOSIS — I110 Hypertensive heart disease with heart failure: Secondary | ICD-10-CM | POA: Diagnosis not present

## 2023-11-04 DIAGNOSIS — I2510 Atherosclerotic heart disease of native coronary artery without angina pectoris: Secondary | ICD-10-CM | POA: Diagnosis not present

## 2023-11-04 DIAGNOSIS — Z01812 Encounter for preprocedural laboratory examination: Secondary | ICD-10-CM

## 2023-11-04 DIAGNOSIS — E871 Hypo-osmolality and hyponatremia: Secondary | ICD-10-CM | POA: Diagnosis not present

## 2023-11-04 DIAGNOSIS — Z7901 Long term (current) use of anticoagulants: Secondary | ICD-10-CM | POA: Insufficient documentation

## 2023-11-04 HISTORY — PX: RIGHT/LEFT HEART CATH AND CORONARY ANGIOGRAPHY: CATH118266

## 2023-11-04 LAB — BASIC METABOLIC PANEL
Anion gap: 11 (ref 5–15)
BUN: 22 mg/dL (ref 8–23)
CO2: 29 mmol/L (ref 22–32)
Calcium: 9.4 mg/dL (ref 8.9–10.3)
Chloride: 89 mmol/L — ABNORMAL LOW (ref 98–111)
Creatinine, Ser: 0.88 mg/dL (ref 0.44–1.00)
GFR, Estimated: >60 mL/min (ref >60)
Glucose, Bld: 107 mg/dL — ABNORMAL HIGH (ref 70–99)
Potassium: 5.3 mmol/L — ABNORMAL HIGH (ref 3.5–5.1)
Sodium: 129 mmol/L — ABNORMAL LOW (ref 135–145)

## 2023-11-04 LAB — POCT I-STAT EG7
Acid-Base Excess: 3 mmol/L — ABNORMAL HIGH (ref 0.0–2.0)
Acid-Base Excess: 5 mmol/L — ABNORMAL HIGH (ref 0.0–2.0)
Bicarbonate: 28.6 mmol/L — ABNORMAL HIGH (ref 20.0–28.0)
Bicarbonate: 30.9 mmol/L — ABNORMAL HIGH (ref 20.0–28.0)
Calcium, Ion: 1.04 mmol/L — ABNORMAL LOW (ref 1.15–1.40)
Calcium, Ion: 1.11 mmol/L — ABNORMAL LOW (ref 1.15–1.40)
HCT: 36 % (ref 36.0–46.0)
HCT: 37 % (ref 36.0–46.0)
Hemoglobin: 12.2 g/dL (ref 12.0–15.0)
Hemoglobin: 12.6 g/dL (ref 12.0–15.0)
O2 Saturation: 64 %
O2 Saturation: 73 %
Potassium: 3.9 mmol/L (ref 3.5–5.1)
Potassium: 4.1 mmol/L (ref 3.5–5.1)
Sodium: 129 mmol/L — ABNORMAL LOW (ref 135–145)
Sodium: 131 mmol/L — ABNORMAL LOW (ref 135–145)
TCO2: 30 mmol/L (ref 22–32)
TCO2: 32 mmol/L (ref 22–32)
pCO2, Ven: 47.4 mm[Hg] (ref 44–60)
pCO2, Ven: 51.2 mm[Hg] (ref 44–60)
pH, Ven: 7.388 (ref 7.25–7.43)
pH, Ven: 7.39 (ref 7.25–7.43)
pO2, Ven: 34 mm[Hg] (ref 32–45)
pO2, Ven: 39 mm[Hg] (ref 32–45)

## 2023-11-04 LAB — POCT I-STAT 7, (LYTES, BLD GAS, ICA,H+H)
Acid-Base Excess: 5 mmol/L — ABNORMAL HIGH (ref 0.0–2.0)
Bicarbonate: 30.2 mmol/L — ABNORMAL HIGH (ref 20.0–28.0)
Calcium, Ion: 1.12 mmol/L — ABNORMAL LOW (ref 1.15–1.40)
HCT: 36 % (ref 36.0–46.0)
Hemoglobin: 12.2 g/dL (ref 12.0–15.0)
O2 Saturation: 91 %
Potassium: 4.2 mmol/L (ref 3.5–5.1)
Sodium: 128 mmol/L — ABNORMAL LOW (ref 135–145)
TCO2: 32 mmol/L (ref 22–32)
pCO2 arterial: 47.1 mm[Hg] (ref 32–48)
pH, Arterial: 7.415 (ref 7.35–7.45)
pO2, Arterial: 60 mm[Hg] — ABNORMAL LOW (ref 83–108)

## 2023-11-04 SURGERY — RIGHT/LEFT HEART CATH AND CORONARY ANGIOGRAPHY
Anesthesia: LOCAL

## 2023-11-04 MED ORDER — MIDAZOLAM HCL 2 MG/2ML IJ SOLN
INTRAMUSCULAR | Status: DC | PRN
  Administered 2023-11-04 (×2): 1 mg via INTRAVENOUS

## 2023-11-04 MED ORDER — SODIUM CHLORIDE 0.9% FLUSH: 3.0000 mL | INTRAVENOUS | Status: DC | PRN

## 2023-11-04 MED ORDER — SODIUM CHLORIDE 0.9 % WEIGHT BASED INFUSION: 3.0000 mL/kg/h | INTRAVENOUS | Status: DC

## 2023-11-04 MED ORDER — MIDAZOLAM HCL 2 MG/2ML IJ SOLN
INTRAMUSCULAR | Status: AC
  Filled 2023-11-04: qty 2

## 2023-11-04 MED ORDER — SODIUM CHLORIDE 0.9 % IV SOLN: 250.0000 mL | INTRAVENOUS | Status: DC | PRN

## 2023-11-04 MED ORDER — SODIUM CHLORIDE 0.9% FLUSH
3.0000 mL | Freq: Two times a day (BID) | INTRAVENOUS | Status: DC
Start: 2023-11-04 — End: 2023-11-04

## 2023-11-04 MED ORDER — FENTANYL CITRATE (PF) 100 MCG/2ML IJ SOLN
INTRAMUSCULAR | Status: AC
  Filled 2023-11-04: qty 2

## 2023-11-04 MED ORDER — VERAPAMIL HCL 2.5 MG/ML IV SOLN
INTRAVENOUS | Status: DC | PRN
  Administered 2023-11-04: 10 mL via INTRA_ARTERIAL

## 2023-11-04 MED ORDER — ONDANSETRON HCL 4 MG/2ML IJ SOLN: 4.0000 mg | Freq: Four times a day (QID) | INTRAMUSCULAR | Status: DC | PRN

## 2023-11-04 MED ORDER — ACETAMINOPHEN 325 MG PO TABS
650.0000 mg | ORAL_TABLET | ORAL | Status: DC | PRN
Start: 2023-11-04 — End: 2023-11-04

## 2023-11-04 MED ORDER — HEPARIN (PORCINE) IN NACL 1000-0.9 UT/500ML-% IV SOLN
INTRAVENOUS | Status: DC | PRN
  Administered 2023-11-04 (×2): 500 mL

## 2023-11-04 MED ORDER — FENTANYL CITRATE (PF) 100 MCG/2ML IJ SOLN
INTRAMUSCULAR | Status: DC | PRN
  Administered 2023-11-04 (×2): 25 ug via INTRAVENOUS

## 2023-11-04 MED ORDER — LIDOCAINE HCL (PF) 1 % IJ SOLN
INTRAMUSCULAR | Status: DC | PRN
  Administered 2023-11-04: 10 mL

## 2023-11-04 MED ORDER — HEPARIN SODIUM (PORCINE) 1000 UNIT/ML IJ SOLN
INTRAMUSCULAR | Status: AC
  Filled 2023-11-04: qty 10

## 2023-11-04 MED ORDER — LIDOCAINE HCL (PF) 1 % IJ SOLN
INTRAMUSCULAR | Status: AC
  Filled 2023-11-04: qty 30

## 2023-11-04 MED ORDER — HEPARIN SODIUM (PORCINE) 1000 UNIT/ML IJ SOLN
INTRAMUSCULAR | Status: DC | PRN
  Administered 2023-11-04: 5000 [IU] via INTRA_ARTERIAL

## 2023-11-04 MED ORDER — ASPIRIN 81 MG PO CHEW: 81.0000 mg | CHEWABLE_TABLET | ORAL | Status: DC

## 2023-11-04 MED ORDER — VERAPAMIL HCL 2.5 MG/ML IV SOLN
INTRAVENOUS | Status: AC
  Filled 2023-11-04: qty 2

## 2023-11-04 MED ORDER — HYDRALAZINE HCL 20 MG/ML IJ SOLN: 10.0000 mg | INTRAMUSCULAR | Status: DC | PRN

## 2023-11-04 MED ORDER — LABETALOL HCL 5 MG/ML IV SOLN: 10.0000 mg | INTRAVENOUS | Status: DC | PRN

## 2023-11-04 MED ORDER — SODIUM CHLORIDE 0.9 % WEIGHT BASED INFUSION: 1.0000 mL/kg/h | INTRAVENOUS | Status: DC

## 2023-11-04 MED ORDER — IOHEXOL 350 MG/ML SOLN
INTRAVENOUS | Status: DC | PRN
  Administered 2023-11-04: 55 mL

## 2023-11-04 SURGICAL SUPPLY — 16 items
CATH BALLN WEDGE 5F 110CM (CATHETERS) IMPLANT
CATH INFINITI 6F ANG MULTIPACK (CATHETERS) IMPLANT
CATH LANGSTON DUAL LUM PIG 6FR (CATHETERS) IMPLANT
DEVICE RAD COMP TR BAND LRG (VASCULAR PRODUCTS) IMPLANT
GLIDESHEATH SLEND SS 6F .021 (SHEATH) IMPLANT
KIT ESSENTIALS PG (KITS) IMPLANT
PACK CARDIAC CATHETERIZATION (CUSTOM PROCEDURE TRAY) ×2 IMPLANT
SET ATX-X65L (MISCELLANEOUS) IMPLANT
SHEATH GLIDE SLENDER 4/5FR (SHEATH) IMPLANT
TRANSDUCER W/MONITORING KIT (MISCELLANEOUS) IMPLANT
TUBING ART PRESS 72 MALE/FEM (TUBING) IMPLANT
WIRE ASAHI PROWATER 180CM (WIRE) IMPLANT
WIRE EMERALD 3MM-J .025X260CM (WIRE) IMPLANT
WIRE EMERALD 3MM-J .035X260CM (WIRE) IMPLANT
WIRE EMERALD ST .035X260CM (WIRE) IMPLANT
WIRE HI TORQ VERSACORE-J 145CM (WIRE) IMPLANT

## 2023-11-04 NOTE — Interval H&P Note (Signed)
History and Physical Interval Note:  11/04/2023 9:33 AM  [name]  has presented today for surgery, with the diagnosis of AS.  The various methods of treatment have been discussed with the patient and family. After consideration of risks, benefits and other options for treatment, the patient has consented to  Procedure(s): RIGHT/LEFT HEART CATH AND CORONARY ANGIOGRAPHY (N/A) as a surgical intervention.  The patient's history has been reviewed, patient examined, no change in status, stable for surgery.  I have reviewed the patient's chart and labs.  Questions were answered to the patient's satisfaction.     [name]

## 2023-11-04 NOTE — Discharge Instructions (Addendum)
Restart Eliquis on 1/31/25Radial Site Care The following information offers guidance on how to care for yourself after your procedure. Your health care provider may also give you more specific instructions. If you have problems or questions, contact your health care provider. What can I expect after the procedure? After the procedure, it is common to have bruising and tenderness in the incision area. Follow these instructions at home: Incision site care  Follow instructions from your health care provider about how to take care of your incision site. Make sure you: Wash your hands with soap and water for at least 20 seconds before and after you change your bandage (dressing). If soap and water are not available, use hand sanitizer. Remove your dressing in 24 hours. Leave stitches (sutures), skin glue, or adhesive strips in place. These skin closures may need to stay in place for 2 weeks or longer. If adhesive strip edges start to loosen and curl up, you may trim the loose edges. Do not remove adhesive strips completely unless your health care provider tells you to do that. Do not take baths, swim, or use a hot tub for at least 1 week. You may shower 24 hours after the procedure or as told by your health care provider. Remove the dressing and gently wash the incision area with plain soap and water. Pat the area dry with a clean towel. Do not rub the site. That could cause bleeding. Do not apply powder or lotion to the site. Check your incision site every day for signs of infection. Check for: Redness, swelling, or pain. Fluid or blood. Warmth. Pus or a bad smell. Activity For 24 hours after the procedure, or as directed by your health care provider: Do not flex or bend the affected arm. Do not push or pull heavy objects with the affected arm. Do not operate machinery or power tools. Do not drive. You should not drive yourself home from the hospital or clinic if you go home during that time  period. You may drive 24 hours after the procedure unless your health care provider tells you not to. Do not lift anything that is heavier than 10 lb (4.5 kg), or the limit that you are told, until your health care provider says that it is safe. Return to your normal activities as told by your health care provider. Ask your health care provider what activities are safe for you and when you can return to work. If you were given a sedative during the procedure, it can affect you for several hours. Do not drive or operate machinery until your health care provider says that it is safe. General instructions Take over-the-counter and prescription medicines only as told by your health care provider. If you will be going home right after the procedure, plan to have a responsible adult care for you for the time you are told. This is important. Keep all follow-up visits. This is important. Contact a health care provider if: You have a fever or chills. You have any of these signs of infection at your incision site: Redness, swelling, or pain. Fluid or blood. Warmth. Pus or a bad smell. Get help right away if: The incision area swells very fast. The incision area is bleeding, and the bleeding does not stop when you hold steady pressure on the area. Your arm or hand becomes pale, cool, tingly, or numb. These symptoms may represent a serious problem that is an emergency. Do not wait to see if the symptoms will go  away. Get medical help right away. Call your local emergency services (911 in the [place] Do not drive yourself to the hospital. Summary After the procedure, it is common to have bruising and tenderness at the incision site. Follow instructions from your health care provider about how to take care of your radial site incision. Check the incision every day for signs of infection. Do not lift anything that is heavier than 10 lb (4.5 kg), or the limit that you are told, until your health care provider  says that it is safe. Get help right away if the incision area swells very fast, you have bleeding at the incision site that will not stop, or your arm or hand becomes pale, cool, or numb. This information is not intended to replace advice given to you by your health care provider. Make sure you discuss any questions you have with your health care provider. Document Revised: 11/10/2020 Document Reviewed: 11/10/2020 Elsevier Patient Education  2024 [employer].

## 2023-11-05 ENCOUNTER — Encounter (HOSPITAL_COMMUNITY): Payer: Self-pay | Admitting: Internal Medicine

## 2023-11-09 ENCOUNTER — Ambulatory Visit: Payer: Medicare Other | Admitting: Internal Medicine

## 2023-11-09 ENCOUNTER — Other Ambulatory Visit: Payer: Self-pay | Admitting: Internal Medicine

## 2023-11-09 VITALS — BP 110/60 | HR 87 | Temp 97.8°F | Resp 18 | Wt 113.2 lb

## 2023-11-09 DIAGNOSIS — J208 Acute bronchitis due to other specified organisms: Secondary | ICD-10-CM | POA: Diagnosis not present

## 2023-11-09 DIAGNOSIS — N390 Urinary tract infection, site not specified: Secondary | ICD-10-CM | POA: Diagnosis not present

## 2023-11-09 LAB — POCT URINALYSIS DIPSTICK
Bilirubin, UA: NEGATIVE
Blood, UA: POSITIVE
Glucose, UA: NEGATIVE
Ketones, UA: POSITIVE
Nitrite, UA: NEGATIVE
Protein, UA: POSITIVE — AB
Spec Grav, UA: 1.01 (ref 1.010–1.025)
Urobilinogen, UA: 0.2 U/dL
pH, UA: 5 (ref 5.0–8.0)

## 2023-11-09 MED ORDER — LEVOFLOXACIN 500 MG PO TABS: 500.0000 mg | ORAL_TABLET | Freq: Every day | ORAL | 0 refills | Status: DC

## 2023-11-09 MED ORDER — CEPHALEXIN 500 MG PO CAPS: 500.0000 mg | ORAL_CAPSULE | Freq: Four times a day (QID) | ORAL | 0 refills | Status: DC

## 2023-11-09 MED ORDER — GUAIFENESIN ER 600 MG PO TB12: 600.0000 mg | ORAL_TABLET | Freq: Two times a day (BID) | ORAL | 0 refills | Status: DC

## 2023-11-09 NOTE — Progress Notes (Addendum)
   Acute Office Visit  Subjective:     Patient ID: [name], female    DOB: 05-Jul-1936, 88 y.o.   MRN: [id number]  Chief Complaint  Patient presents with   office visit    Patient having UTI    HPI Patient is in today for burning urination for 2-3 days.  No fever or chills.  No abdominal or flank pain.  She has the same symptoms when she has previous urinary tract infection.  She is also complaining of chest congestion.  Bringing yellow-colored phlegm and feels that she may be having wheezing also.  No fever or chills.  Review of Systems  Constitutional: Negative.   Respiratory:  Positive for cough and wheezing.   Genitourinary:  Positive for dysuria.        Objective:    BP 110/60 (BP Location: Left Arm, Patient Position: Sitting, Cuff Size: Normal)   Pulse 87   Temp 97.8 F (36.6 C)   Resp 18   Wt 113 lb 4 oz (51.4 kg)   SpO2 96%   BMI 20.06 kg/m    Physical Exam Constitutional:      Appearance: Normal appearance.  Pulmonary:     Effort: Pulmonary effort is normal.     Breath sounds: Rhonchi present.  Abdominal:     General: Bowel sounds are normal.     Palpations: Abdomen is soft.     Tenderness: There is no right CVA tenderness or left CVA tenderness.  Musculoskeletal:        General: No tenderness.  Neurological:     Mental Status: She is alert.     No results found for any visits on 11/09/23.      Assessment & Plan:   Problem List Items Addressed This Visit       Respiratory   Acute bronchitis due to other specified organisms   She is wheezing.  I will start her on Levaquin  that will cover for urinary tract infection as well.      Relevant Medications   cephALEXin  (KEFLEX ) 500 MG capsule     Genitourinary   Urinary tract infection without hematuria - Primary   Her urine analysis is suggestive of urinary tract infection.  I will start Levaquin  and send urine for culture.      Relevant Medications   cephALEXin  (KEFLEX ) 500 MG  capsule   Other Relevant Orders   POCT urinalysis dipstick (Completed)   Urine Culture    No orders of the defined types were placed in this encounter.   No follow-ups on file.  Roetta Dare, MD

## 2023-11-11 LAB — URINE CULTURE

## 2023-11-11 NOTE — Assessment & Plan Note (Signed)
 She is wheezing.  I will start her on Levaquin  that will cover for urinary tract infection as well.

## 2023-11-11 NOTE — Assessment & Plan Note (Signed)
 Her urine analysis is suggestive of urinary tract infection.  I will start Levaquin  and send urine for culture.

## 2023-11-12 ENCOUNTER — Inpatient Hospital Stay (HOSPITAL_COMMUNITY)
Admission: EM | Admit: 2023-11-12 | Discharge: 2023-11-14 | DRG: 813 | Payer: Medicare Other | Attending: Internal Medicine | Admitting: Internal Medicine

## 2023-11-12 ENCOUNTER — Encounter (HOSPITAL_COMMUNITY): Payer: Self-pay

## 2023-11-12 ENCOUNTER — Emergency Department (HOSPITAL_COMMUNITY): Payer: Medicare Other

## 2023-11-12 ENCOUNTER — Other Ambulatory Visit: Payer: Self-pay

## 2023-11-12 DIAGNOSIS — I350 Nonrheumatic aortic (valve) stenosis: Secondary | ICD-10-CM | POA: Diagnosis present

## 2023-11-12 DIAGNOSIS — R197 Diarrhea, unspecified: Secondary | ICD-10-CM | POA: Diagnosis not present

## 2023-11-12 DIAGNOSIS — R58 Hemorrhage, not elsewhere classified: Secondary | ICD-10-CM | POA: Diagnosis not present

## 2023-11-12 DIAGNOSIS — R0600 Dyspnea, unspecified: Secondary | ICD-10-CM | POA: Diagnosis not present

## 2023-11-12 DIAGNOSIS — E039 Hypothyroidism, unspecified: Secondary | ICD-10-CM | POA: Diagnosis present

## 2023-11-12 DIAGNOSIS — I480 Paroxysmal atrial fibrillation: Secondary | ICD-10-CM | POA: Diagnosis not present

## 2023-11-12 DIAGNOSIS — I2582 Chronic total occlusion of coronary artery: Secondary | ICD-10-CM | POA: Diagnosis not present

## 2023-11-12 DIAGNOSIS — Z8673 Personal history of transient ischemic attack (TIA), and cerebral infarction without residual deficits: Secondary | ICD-10-CM

## 2023-11-12 DIAGNOSIS — Z1152 Encounter for screening for COVID-19: Secondary | ICD-10-CM | POA: Diagnosis not present

## 2023-11-12 DIAGNOSIS — Z8249 Family history of ischemic heart disease and other diseases of the circulatory system: Secondary | ICD-10-CM | POA: Diagnosis not present

## 2023-11-12 DIAGNOSIS — Z85850 Personal history of malignant neoplasm of thyroid: Secondary | ICD-10-CM

## 2023-11-12 DIAGNOSIS — K921 Melena: Secondary | ICD-10-CM | POA: Diagnosis not present

## 2023-11-12 DIAGNOSIS — E785 Hyperlipidemia, unspecified: Secondary | ICD-10-CM | POA: Diagnosis present

## 2023-11-12 DIAGNOSIS — Z8616 Personal history of COVID-19: Secondary | ICD-10-CM | POA: Diagnosis not present

## 2023-11-12 DIAGNOSIS — Z79899 Other long term (current) drug therapy: Secondary | ICD-10-CM | POA: Diagnosis not present

## 2023-11-12 DIAGNOSIS — Z86718 Personal history of other venous thrombosis and embolism: Secondary | ICD-10-CM

## 2023-11-12 DIAGNOSIS — N3090 Cystitis, unspecified without hematuria: Secondary | ICD-10-CM | POA: Diagnosis not present

## 2023-11-12 DIAGNOSIS — I2510 Atherosclerotic heart disease of native coronary artery without angina pectoris: Secondary | ICD-10-CM | POA: Diagnosis not present

## 2023-11-12 DIAGNOSIS — E871 Hypo-osmolality and hyponatremia: Secondary | ICD-10-CM | POA: Diagnosis not present

## 2023-11-12 DIAGNOSIS — Z90710 Acquired absence of both cervix and uterus: Secondary | ICD-10-CM

## 2023-11-12 DIAGNOSIS — J101 Influenza due to other identified influenza virus with other respiratory manifestations: Principal | ICD-10-CM | POA: Diagnosis present

## 2023-11-12 DIAGNOSIS — M069 Rheumatoid arthritis, unspecified: Secondary | ICD-10-CM | POA: Diagnosis not present

## 2023-11-12 DIAGNOSIS — Z66 Do not resuscitate: Secondary | ICD-10-CM | POA: Diagnosis not present

## 2023-11-12 DIAGNOSIS — Z79890 Hormone replacement therapy (postmenopausal): Secondary | ICD-10-CM

## 2023-11-12 DIAGNOSIS — J40 Bronchitis, not specified as acute or chronic: Secondary | ICD-10-CM | POA: Diagnosis present

## 2023-11-12 DIAGNOSIS — Z9049 Acquired absence of other specified parts of digestive tract: Secondary | ICD-10-CM

## 2023-11-12 DIAGNOSIS — Z885 Allergy status to narcotic agent status: Secondary | ICD-10-CM

## 2023-11-12 DIAGNOSIS — D6832 Hemorrhagic disorder due to extrinsic circulating anticoagulants: Principal | ICD-10-CM | POA: Diagnosis present

## 2023-11-12 DIAGNOSIS — Z7901 Long term (current) use of anticoagulants: Secondary | ICD-10-CM | POA: Diagnosis not present

## 2023-11-12 DIAGNOSIS — I110 Hypertensive heart disease with heart failure: Secondary | ICD-10-CM | POA: Diagnosis present

## 2023-11-12 DIAGNOSIS — I5032 Chronic diastolic (congestive) heart failure: Secondary | ICD-10-CM | POA: Diagnosis present

## 2023-11-12 DIAGNOSIS — Z833 Family history of diabetes mellitus: Secondary | ICD-10-CM

## 2023-11-12 DIAGNOSIS — H3530 Unspecified macular degeneration: Secondary | ICD-10-CM | POA: Diagnosis present

## 2023-11-12 DIAGNOSIS — A419 Sepsis, unspecified organism: Principal | ICD-10-CM | POA: Diagnosis present

## 2023-11-12 DIAGNOSIS — Z888 Allergy status to other drugs, medicaments and biological substances status: Secondary | ICD-10-CM

## 2023-11-12 DIAGNOSIS — K922 Gastrointestinal hemorrhage, unspecified: Secondary | ICD-10-CM | POA: Diagnosis not present

## 2023-11-12 DIAGNOSIS — I10 Essential (primary) hypertension: Secondary | ICD-10-CM | POA: Diagnosis present

## 2023-11-12 DIAGNOSIS — Z86711 Personal history of pulmonary embolism: Secondary | ICD-10-CM | POA: Diagnosis present

## 2023-11-12 DIAGNOSIS — T45515A Adverse effect of anticoagulants, initial encounter: Secondary | ICD-10-CM | POA: Diagnosis present

## 2023-11-12 DIAGNOSIS — Z823 Family history of stroke: Secondary | ICD-10-CM

## 2023-11-12 LAB — TROPONIN I (HIGH SENSITIVITY)
Troponin I (High Sensitivity): 8 ng/L (ref <18)
Troponin I (High Sensitivity): 6 ng/L (ref <18)

## 2023-11-12 LAB — COMPREHENSIVE METABOLIC PANEL
ALT: 21 U/L (ref 0–44)
AST: 40 U/L (ref 15–41)
Albumin: 3.1 g/dL — ABNORMAL LOW (ref 3.5–5.0)
Alkaline Phosphatase: 55 U/L (ref 38–126)
Anion gap: 13 (ref 5–15)
BUN: 20 mg/dL (ref 8–23)
CO2: 28 mmol/L (ref 22–32)
Calcium: 8.4 mg/dL — ABNORMAL LOW (ref 8.9–10.3)
Chloride: 85 mmol/L — ABNORMAL LOW (ref 98–111)
Creatinine, Ser: 0.81 mg/dL (ref 0.44–1.00)
GFR, Estimated: >60 mL/min (ref >60)
Glucose, Bld: 88 mg/dL (ref 70–99)
Potassium: 4.3 mmol/L (ref 3.5–5.1)
Sodium: 126 mmol/L — ABNORMAL LOW (ref 135–145)
Total Bilirubin: 1 mg/dL (ref 0.0–1.2)
Total Protein: 7.2 g/dL (ref 6.5–8.1)

## 2023-11-12 LAB — CBC WITH DIFFERENTIAL/PLATELET
Abs Immature Granulocytes: 0.02 10*3/uL (ref 0.00–0.07)
Basophils Absolute: 0 10*3/uL (ref 0.0–0.1)
Basophils Relative: 0 %
Eosinophils Absolute: 0 10*3/uL (ref 0.0–0.5)
Eosinophils Relative: 0 %
HCT: 37.2 % (ref 36.0–46.0)
Hemoglobin: 12.8 g/dL (ref 12.0–15.0)
Immature Granulocytes: 0 %
Lymphocytes Relative: 10 %
Lymphs Abs: 0.5 10*3/uL — ABNORMAL LOW (ref 0.7–4.0)
MCH: 32.5 pg (ref 26.0–34.0)
MCHC: 34.4 g/dL (ref 30.0–36.0)
MCV: 94.4 fL (ref 80.0–100.0)
Monocytes Absolute: 0.6 10*3/uL (ref 0.1–1.0)
Monocytes Relative: 13 %
Neutro Abs: 3.4 10*3/uL (ref 1.7–7.7)
Neutrophils Relative %: 77 %
Platelets: 160 10*3/uL (ref 150–400)
RBC: 3.94 MIL/uL (ref 3.87–5.11)
RDW: 13 % (ref 11.5–15.5)
WBC: 4.5 10*3/uL (ref 4.0–10.5)
nRBC: 0 % (ref 0.0–0.2)

## 2023-11-12 LAB — RESP PANEL BY RT-PCR (RSV, FLU A&B, COVID)  RVPGX2
Influenza A by PCR: POSITIVE — AB
Influenza B by PCR: NEGATIVE
Resp Syncytial Virus by PCR: NEGATIVE
SARS Coronavirus 2 by RT PCR: NEGATIVE

## 2023-11-12 LAB — URINALYSIS, W/ REFLEX TO CULTURE (INFECTION SUSPECTED)
Bilirubin Urine: NEGATIVE
Glucose, UA: NEGATIVE mg/dL
Hgb urine dipstick: NEGATIVE
Ketones, ur: NEGATIVE mg/dL
Nitrite: NEGATIVE
Protein, ur: NEGATIVE mg/dL
Specific Gravity, Urine: 1.01 (ref 1.005–1.030)
pH: 7 (ref 5.0–8.0)

## 2023-11-12 LAB — TYPE AND SCREEN
ABO/RH(D): A POS
Antibody Screen: NEGATIVE

## 2023-11-12 LAB — T4, FREE: Free T4: 1.61 ng/dL — ABNORMAL HIGH (ref 0.61–1.12)

## 2023-11-12 LAB — TSH: TSH: 0.39 u[IU]/mL (ref 0.350–4.500)

## 2023-11-12 LAB — HEMOGLOBIN AND HEMATOCRIT, BLOOD
HCT: 35.6 % — ABNORMAL LOW (ref 36.0–46.0)
Hemoglobin: 12.3 g/dL (ref 12.0–15.0)

## 2023-11-12 LAB — PROTIME-INR
INR: 1.4 — ABNORMAL HIGH (ref 0.8–1.2)
Prothrombin Time: 17.5 s — ABNORMAL HIGH (ref 11.4–15.2)

## 2023-11-12 LAB — POC OCCULT BLOOD, ED: Fecal Occult Bld: POSITIVE — AB

## 2023-11-12 LAB — CK: Total CK: 93 U/L (ref 38–234)

## 2023-11-12 MED ORDER — ACETAMINOPHEN 325 MG PO TABS: 650.0000 mg | ORAL_TABLET | Freq: Four times a day (QID) | ORAL | Status: DC | PRN

## 2023-11-12 MED ORDER — SODIUM CHLORIDE 0.9 % IV SOLN: 2.0000 g | Freq: Every day | INTRAVENOUS | Status: DC

## 2023-11-12 MED ORDER — OSELTAMIVIR PHOSPHATE 75 MG PO CAPS
75.0000 mg | ORAL_CAPSULE | Freq: Once | ORAL | Status: AC
Start: 2023-11-12 — End: 2023-11-12
  Administered 2023-11-12: 75 mg via ORAL
  Filled 2023-11-12: qty 1

## 2023-11-12 MED ORDER — PANTOPRAZOLE SODIUM 40 MG IV SOLR
40.0000 mg | Freq: Once | INTRAVENOUS | Status: AC
  Administered 2023-11-12: 40 mg via INTRAVENOUS
  Filled 2023-11-12: qty 10

## 2023-11-12 MED ORDER — ONDANSETRON HCL 4 MG/2ML IJ SOLN: 4.0000 mg | Freq: Four times a day (QID) | INTRAMUSCULAR | Status: DC | PRN

## 2023-11-12 MED ORDER — ONDANSETRON HCL 4 MG PO TABS: 4.0000 mg | ORAL_TABLET | Freq: Four times a day (QID) | ORAL | Status: DC | PRN

## 2023-11-12 MED ORDER — MORPHINE SULFATE (PF) 2 MG/ML IV SOLN: 2.0000 mg | INTRAVENOUS | Status: DC | PRN

## 2023-11-12 MED ORDER — LACTATED RINGERS IV SOLN: INTRAVENOUS | Status: AC

## 2023-11-12 MED ORDER — PANTOPRAZOLE SODIUM 40 MG IV SOLR
40.0000 mg | Freq: Two times a day (BID) | INTRAVENOUS | Status: DC
Start: 2023-11-12 — End: 2023-11-13
  Administered 2023-11-12 – 2023-11-13 (×2): 40 mg via INTRAVENOUS
  Filled 2023-11-12 (×2): qty 10

## 2023-11-12 MED ORDER — ACETAMINOPHEN 650 MG RE SUPP: 650.0000 mg | Freq: Four times a day (QID) | RECTAL | Status: DC | PRN

## 2023-11-12 NOTE — Assessment & Plan Note (Addendum)
 Vitals:   11/12/23 0941 11/12/23 1030 11/12/23 1330  BP: 131/79 114/68 138/78  Stable  Home regimen includes losartan , Lasix , amlodipine , metoprolol , Imdur . Currently with blood pressure being well-controlled we will hold meds with as needed hydralazine  IV.

## 2023-11-12 NOTE — H&P (Addendum)
 History and Physical    Patient: [name] FMW:[id number] DOB: Aug 25, 1936 DOA: 11/12/2023 DOS: the patient was seen and examined on 11/12/2023 PCP: Nichole Senior, MD  Patient coming from: Home Chief complaint: Melena.  HPI:  [name] is a 88 y.o. female with past medical history  of A-fib/PE on Eliquis , allergies to hydrocodone, oxycodone and prednisone, hypothyroidism, hyperlipidemia, CAD, carotid stenosis, RA, essential hypertension, history of TIA, Macular degeneration, chronic diastolic congestive heart failure, DNR and DNI coming to the emergency room for melena.  Patient was seen by PCP on the fourth with complaints of dysuria and started on Levaquin  and Keflex .  Patient was also seen by cardiology and of January this year and was scheduled for left and right heart cath for her persistent shortness of breath.  Patient today comes with melena off and on for about 2 weeks and has been on Eliquis  for several years for her A-fib and PE patient also reports of diarrhea.  Patient also reports a cough no fever nausea vomiting chest pain palpitations falls dizziness headache speech or vision issues.  >>ED Course: In emergency room alert awake oriented afebrile patient meeting sepsis criteria. Vitals:   11/12/23 0941 11/12/23 1030 11/12/23 1330 11/12/23 1345  BP: 131/79 114/68 138/78   Pulse: 83 87 94   Temp: (!) 97.4 F (36.3 C)   98.6 F (37 C)  Resp: (!) 31 20 (!) 22   Height:      Weight:      SpO2: 98% 96% 96%   TempSrc: Oral   Oral  BMI (Calculated):      ED evaluation  so far shows: EKG today shows A-fib at 89 QTc 423.  No ST-T wave changes.  Troponin of 8. Metabolic panel shows sodium 126 normal kidney function normal LFTs. CBC is within normal limits.  INR 1.4 Respiratory panel is positive for flu A. Urinalysis today is abnormal hazy with moderate leukocytes and 11-20 WBCs and RBCs with few bacteria. Fecal occult is positive today.  In the emergency room  pt has  received the following treatment thus far: Medications  oseltamivir  (TAMIFLU ) capsule 75 mg (has no administration in time range)  pantoprazole  (PROTONIX ) injection 40 mg (40 mg Intravenous Given 11/12/23 1055)   Review of Systems  Gastrointestinal:  Positive for blood in stool and melena.  Neurological:  Positive for weakness.   Past Medical History:  Diagnosis Date   Aortic stenosis    Atrial fibrillation (HCC)    CAD, UNSPECIFIED SITE    2009. 99% proximal RCA stenosis followed by 80% distal stenosis. The LAD has a 100% stenosis in the midsegment. The circumflex is patent with distal occlusion. She has been managed medically. Her EF was well-preserved.   CAROTID STENOSIS    Chronic diastolic (congestive) heart failure (HCC)    DIVERTICULAR DISEASE    Elevated troponin- 0.58 in setting of PSVT 10/29/2013   Essential hypertension, benign    Gastritis    H/O blood clots 1984   DVT and PE   Hx of degenerative disc disease    HYPERLIPIDEMIA    MACULAR DEGENERATION    OSTEOPOROSIS    Rheumatoid arthritis(714.0)    Thyroid  cancer (HCC) 2008   Past Surgical History:  Procedure Laterality Date   ABDOMINAL HYSTERECTOMY  1984   APPENDECTOMY  1959   BREAST CYST EXCISION     left   CARDIOVERSION N/A 08/13/2021   Procedure: CARDIOVERSION;  Surgeon: Raford Riggs, MD;  Location: [facility] ENDOSCOPY;  Service: Cardiovascular;  Laterality: N/A;   CATARACT EXTRACTION, BILATERAL     CHOLECYSTECTOMY  2009   COLONOSCOPY     ESOPHAGOGASTRODUODENOSCOPY     IM NAILING FEMORAL SHAFT RETROGRADE  2010   left   RIGHT/LEFT HEART CATH AND CORONARY ANGIOGRAPHY N/A 11/04/2023   Procedure: RIGHT/LEFT HEART CATH AND CORONARY ANGIOGRAPHY;  Surgeon: Wendel Lurena POUR, MD;  Location: [facility] INVASIVE CV LAB;  Service: Cardiovascular;  Laterality: N/A;   TONSILLECTOMY     WRIST SURGERY  2004   Left    reports that she has never smoked. She has never used smokeless tobacco. She reports that she does not drink  alcohol and does not use drugs.  Allergies  Allergen Reactions   Hydrocodone Other (See Comments)    feels weird   Prednisone Nausea And Vomiting    Oral    Family History  Problem Relation Age of Onset   Heart attack Mother    Clotting disorder Mother    Heart disease Father    Diabetes Father    CAD Sister    Stroke Sister    Lung cancer Brother    Ovarian cancer Maternal Aunt    Lung cancer Maternal Uncle    Diabetes Niece     Prior to Admission medications   Medication Sig Start Date End Date Taking? Authorizing Provider  Acetaminophen  (TYLENOL  PO) Take 1,000 mg by mouth 2 (two) times daily.    [provider]  amLODipine  (NORVASC ) 5 MG tablet TAKE 1 TABLET(5 MG) BY MOUTH DAILY 12/25/22   Lavona Agent, MD  atorvastatin  (LIPITOR) 40 MG tablet Take 1 tablet (40 mg total) by mouth daily. 09/05/21   Daneen Damien BROCKS, NP  Calcium  Carbonate-Vitamin D  (CALCIUM  + D PO) Take 600 mg by mouth 2 (two) times daily.    [provider]  cephALEXin  (KEFLEX ) 500 MG capsule Take 1 capsule (500 mg total) by mouth 4 (four) times daily for 7 days. 11/09/23 11/16/23  Amin, Saad, MD  ELIQUIS  2.5 MG TABS tablet TAKE 1 TABLET(2.5 MG) BY MOUTH TWICE DAILY 08/23/23   Lavona Agent, MD  furosemide  (LASIX ) 20 MG tablet TAKE 1 TABLET(20 MG) BY MOUTH DAILY Patient taking differently: Take 40 mg by mouth daily. 10/08/23   Daneen Damien BROCKS, NP  guaiFENesin  (MUCINEX ) 600 MG 12 hr tablet Take 1 tablet (600 mg total) by mouth 2 (two) times daily. 11/09/23   Amin, Saad, MD  isosorbide  mononitrate (IMDUR ) 30 MG 24 hr tablet Take 30 mg by mouth daily.    [provider]  levofloxacin  (LEVAQUIN ) 500 MG tablet Take 1 tablet (500 mg total) by mouth daily. 11/09/23   Amin, Saad, MD  levothyroxine  (SYNTHROID ) 88 MCG tablet Take 88 mcg by mouth daily before breakfast.    [provider]  losartan  (COZAAR ) 100 MG tablet Take 0.5 tablets (50 mg total) by mouth daily. Patient not taking:  Reported on 10/29/2023 03/22/23 10/29/23  Lavona Agent, MD  losartan  (COZAAR ) 50 MG tablet Take 50 mg by mouth daily. 09/18/23   [provider]  metoprolol  succinate (TOPROL -XL) 100 MG 24 hr tablet Take 100 mg by mouth 2 (two) times daily. Take with or immediately following a meal.    [provider]  Multiple Vitamins-Minerals (CENTRUM SILVER 50+WOMEN) TABS Take 1 tablet by mouth daily.    [provider]  nitroGLYCERIN  (NITROSTAT ) 0.4 MG SL tablet For chest pain, tightness, or pressure. While sitting, place 1 tablet under tongue. May be used every 5  minutes as needed, for up to 15 minutes. Do not use more than 3 tablets. 09/30/22   Lavona Agent, MD  olopatadine (PATANOL) 0.1 % ophthalmic solution Place 1 drop into both eyes daily.    [provider]  Polyethyl Glycol-Propyl Glycol (SYSTANE OP) Place 1 drop into both eyes daily as needed (dry eyes).    [provider]     Vitals:   11/12/23 9058 11/12/23 1030 11/12/23 1330 11/12/23 1345  BP: 131/79 114/68 138/78   Pulse: 83 87 94   Resp: (!) 31 20 (!) 22   Temp: (!) 97.4 F (36.3 C)   98.6 F (37 C)  TempSrc: Oral   Oral  SpO2: 98% 96% 96%   Weight:      Height:       Physical Exam Vitals and nursing note reviewed.  Constitutional:      General: She is not in acute distress.    Appearance: She is ill-appearing.  HENT:     Head: Normocephalic and atraumatic.     Right Ear: Hearing normal.     Left Ear: Hearing normal.     Nose: No nasal deformity.     Mouth/Throat:     Lips: Pink.     Tongue: No lesions.  Eyes:     General: Lids are normal.     Extraocular Movements: Extraocular movements intact.  Cardiovascular:     Rate and Rhythm: Normal rate. Rhythm irregular.     Heart sounds: Normal heart sounds.  Pulmonary:     Effort: Pulmonary effort is normal.     Breath sounds: Examination of the right-upper field reveals rhonchi. Examination of the left-upper field reveals  rhonchi. Examination of the right-middle field reveals rhonchi. Examination of the left-middle field reveals rhonchi. Examination of the right-lower field reveals rhonchi. Examination of the left-lower field reveals rhonchi. Rhonchi present.  Abdominal:     General: Bowel sounds are normal. There is no distension.     Palpations: Abdomen is soft. There is no mass.     Tenderness: There is no abdominal tenderness.  Musculoskeletal:     Right lower leg: No edema.     Left lower leg: No edema.  Skin:    General: Skin is warm.  Neurological:     General: No focal deficit present.     Mental Status: She is alert and oriented to person, place, and time.     Cranial Nerves: Cranial nerves 2-12 are intact.  Psychiatric:        Attention and Perception: Attention normal.        Mood and Affect: Mood normal.        Speech: Speech normal.        Behavior: Behavior normal. Behavior is cooperative.     Labs on Admission: I have personally reviewed following labs and imaging studies Results for orders placed or performed during the hospital encounter of 11/12/23 (from the past 24 hours)  Resp panel by RT-PCR (RSV, Flu A&B, Covid) Anterior Nasal Swab     Status: Abnormal   Collection Time: 11/12/23  9:30 AM   Specimen: Anterior Nasal Swab  Result Value Ref Range   SARS Coronavirus 2 by RT PCR NEGATIVE NEGATIVE   Influenza A by PCR POSITIVE (A) NEGATIVE   Influenza B by PCR NEGATIVE NEGATIVE   Resp Syncytial Virus by PCR NEGATIVE NEGATIVE  POC occult blood, ED Provider will collect     Status: Abnormal   Collection Time: 11/12/23  9:38 AM  Result Value Ref Range   Fecal Occult Bld POSITIVE (A) NEGATIVE  Type and screen [facility]     Status: None   Collection Time: 11/12/23 10:29 AM  Result Value Ref Range   ABO/RH(D) A POS    Antibody Screen NEG    Sample Expiration      11/15/2023,2359 Performed at [facility], [address], [place], [place] [address]   CBC  with Differential     Status: Abnormal   Collection Time: 11/12/23 10:30 AM  Result Value Ref Range   WBC 4.5 4.0 - 10.5 K/uL   RBC 3.94 3.87 - 5.11 MIL/uL   Hemoglobin 12.8 12.0 - 15.0 g/dL   HCT 62.7 63.9 - 53.9 %   MCV 94.4 80.0 - 100.0 fL   MCH 32.5 26.0 - 34.0 pg   MCHC 34.4 30.0 - 36.0 g/dL   RDW 86.9 88.4 - 84.4 %   Platelets 160 150 - 400 K/uL   nRBC 0.0 0.0 - 0.2 %   Neutrophils Relative % 77 %   Neutro Abs 3.4 1.7 - 7.7 K/uL   Lymphocytes Relative 10 %   Lymphs Abs 0.5 (L) 0.7 - 4.0 K/uL   Monocytes Relative 13 %   Monocytes Absolute 0.6 0.1 - 1.0 K/uL   Eosinophils Relative 0 %   Eosinophils Absolute 0.0 0.0 - 0.5 K/uL   Basophils Relative 0 %   Basophils Absolute 0.0 0.0 - 0.1 K/uL   Immature Granulocytes 0 %   Abs Immature Granulocytes 0.02 0.00 - 0.07 K/uL  Urinalysis, w/ Reflex to Culture (Infection Suspected) -Urine, Clean Catch     Status: Abnormal   Collection Time: 11/12/23 11:16 AM  Result Value Ref Range   Specimen Source URINE, CLEAN CATCH    Color, Urine YELLOW YELLOW   APPearance HAZY (A) CLEAR   Specific Gravity, Urine 1.010 1.005 - 1.030   pH 7.0 5.0 - 8.0   Glucose, UA NEGATIVE NEGATIVE mg/dL   Hgb urine dipstick NEGATIVE NEGATIVE   Bilirubin Urine NEGATIVE NEGATIVE   Ketones, ur NEGATIVE NEGATIVE mg/dL   Protein, ur NEGATIVE NEGATIVE mg/dL   Nitrite NEGATIVE NEGATIVE   Leukocytes,Ua MODERATE (A) NEGATIVE   RBC / HPF 11-20 0 - 5 RBC/hpf   WBC, UA 11-20 0 - 5 WBC/hpf   Bacteria, UA FEW (A) NONE SEEN   Squamous Epithelial / HPF 0-5 0 - 5 /HPF   Non Squamous Epithelial 0-5 (A) NONE SEEN  Protime-INR     Status: Abnormal   Collection Time: 11/12/23 11:51 AM  Result Value Ref Range   Prothrombin Time 17.5 (H) 11.4 - 15.2 seconds   INR 1.4 (H) 0.8 - 1.2  Comprehensive metabolic panel     Status: Abnormal   Collection Time: 11/12/23 11:51 AM  Result Value Ref Range   Sodium 126 (L) 135 - 145 mmol/L   Potassium 4.3 3.5 - 5.1 mmol/L   Chloride  85 (L) 98 - 111 mmol/L   CO2 28 22 - 32 mmol/L   Glucose, Bld 88 70 - 99 mg/dL   BUN 20 8 - 23 mg/dL   Creatinine, Ser 9.18 0.44 - 1.00 mg/dL   Calcium  8.4 (L) 8.9 - 10.3 mg/dL   Total Protein 7.2 6.5 - 8.1 g/dL   Albumin 3.1 (L) 3.5 - 5.0 g/dL   AST 40 15 - 41 U/L   ALT 21 0 - 44 U/L   Alkaline Phosphatase 55 38 -  126 U/L   Total Bilirubin 1.0 0.0 - 1.2 mg/dL   GFR, Estimated >39 >39 mL/min   Anion gap 13 5 - 15  Troponin I (High Sensitivity)     Status: None   Collection Time: 11/12/23 11:51 AM  Result Value Ref Range   Troponin I (High Sensitivity) 8 <18 ng/L   Recent Results (from the past 720 hours)  Urine Culture     Status: None   Collection Time: 11/09/23  3:15 PM  Result Value Ref Range Status   Urine Culture, Routine Final report  Final   Organism ID, Bacteria Comment  Final    Comment: Culture shows less than 10,000 colony forming units of bacteria per milliliter of urine. This colony count is not generally considered to be clinically significant.   Resp panel by RT-PCR (RSV, Flu A&B, Covid) Anterior Nasal Swab     Status: Abnormal   Collection Time: 11/12/23  9:30 AM   Specimen: Anterior Nasal Swab  Result Value Ref Range Status   SARS Coronavirus 2 by RT PCR NEGATIVE NEGATIVE Final   Influenza A by PCR POSITIVE (A) NEGATIVE Final   Influenza B by PCR NEGATIVE NEGATIVE Final    Comment: (NOTE) The Xpert Xpress SARS-CoV-2/FLU/RSV plus assay is intended as an aid in the diagnosis of influenza from Nasopharyngeal swab specimens and should not be used as a sole basis for treatment. Nasal washings and aspirates are unacceptable for Xpert Xpress SARS-CoV-2/FLU/RSV testing.  Fact Sheet for Patients: bloggercourse.com  Fact Sheet for Healthcare Providers: seriousbroker.it  This test is not yet approved or cleared by the [place]  FDA and has been authorized for detection and/or diagnosis of SARS-CoV-2 by FDA  under an Emergency Use Authorization (EUA). This EUA will remain in effect (meaning this test can be used) for the duration of the COVID-19 declaration under Section 564(b)(1) of the Act, 21 U.S.C. section 360bbb-3(b)(1), unless the authorization is terminated or revoked.     Resp Syncytial Virus by PCR NEGATIVE NEGATIVE Final    Comment: (NOTE) Fact Sheet for Patients: bloggercourse.com  Fact Sheet for Healthcare Providers: seriousbroker.it  This test is not yet approved or cleared by the [place]  FDA and has been authorized for detection and/or diagnosis of SARS-CoV-2 by FDA under an Emergency Use Authorization (EUA). This EUA will remain in effect (meaning this test can be used) for the duration of the COVID-19 declaration under Section 564(b)(1) of the Act, 21 U.S.C. section 360bbb-3(b)(1), unless the authorization is terminated or revoked.  Performed at [facility], [address], [place], [place] [address]    CBC:    Latest Ref Rng & Units 11/12/2023   10:30 AM 11/04/2023   12:16 PM 11/04/2023   12:12 PM  CBC  WBC 4.0 - 10.5 K/uL 4.5     Hemoglobin 12.0 - 15.0 g/dL 87.1  87.7  87.3   Hematocrit 36.0 - 46.0 % 37.2  36.0  37.0   Platelets 150 - 400 K/uL 160      Basic Metabolic Panel: Recent Labs  Lab 11/12/23 1151  NA 126*  K 4.3  CL 85*  CO2 28  GLUCOSE 88  BUN 20  CREATININE 0.81  CALCIUM  8.4*   Creatinine: Lab Results  Component Value Date   CREATININE 0.81 11/12/2023   CREATININE 0.88 11/04/2023   CREATININE 0.85 10/29/2023   Liver Function Tests:    Latest Ref Rng & Units 11/12/2023   11:51 AM 09/26/2022    5:03  PM 09/26/2022    1:04 AM  Hepatic Function  Total Protein 6.5 - 8.1 g/dL 7.2   8.0   Albumin 3.5 - 5.0 g/dL 3.1  3.7  4.0   AST 15 - 41 U/L 40   36   ALT 0 - 44 U/L 21   19   Alk Phosphatase 38 - 126 U/L 55   57   Total Bilirubin 0.0 - 1.2 mg/dL 1.0   1.7    Coagulation  Profile: Recent Labs  Lab 11/12/23 1151  INR 1.4*   Radiological Exams on Admission: DG Chest Port 1 View Result Date: 11/12/2023 CLINICAL DATA:  Intermittent melena EXAM: PORTABLE CHEST 1 VIEW COMPARISON:  Chest radiograph dated 07/09/2023 FINDINGS: Normal lung volumes. No focal consolidations. No pleural effusion or pneumothorax. Similar enlarged cardiomediastinal silhouette. No acute osseous abnormality. Surgical clips project over the neck. IMPRESSION: 1. No acute disease. 2. Similar cardiomegaly. Electronically Signed   By: Limin  Xu M.D.   On: 11/12/2023 10:19    Data Reviewed: Relevant notes from primary care and specialist visits, past discharge summaries as available in EHR, including Care Everywhere. Prior diagnostic testing as pertinent to current admission diagnoses, Updated medications and problem lists for reconciliation ED course, including vitals, labs, imaging, treatment and response to treatment,Triage notes, nursing and pharmacy notes and ED provider's notes Notable results as noted in HPI.Discussed case with EDMD/ ED APP/ or Specialty MD on call and as needed.  Assessment & Plan Melena 2/2 to Eliquis .  We will hold same.  GI Consulted per EDMD.  Type/screen. IV PPI.  NPO except for Ice chips.   PAF (paroxysmal atrial fibrillation) (HCC) Rate controlled and we will monitor on telemetry.  CHA2DS2/VAS Stroke Risk Points  Current as of 20 minutes ago     8 >= 2 Points: High Risk  1 to 1.99 Points: Medium Risk  0 Points: Low Risk    Last Change: N/A      Details    This score determines the patient's risk of having a stroke if the  patient has atrial fibrillation.       Points Metrics  1 Has Congestive Heart Failure:  Yes    Current as of 20 minutes ago  1 Has Vascular Disease:  Yes    Current as of 20 minutes ago  1 Has Hypertension:  Yes    Current as of 20 minutes ago  2 Age:  81    Current as of 20 minutes ago  0 Has Diabetes Excluding Gestational  Diabetes:  No    Current as of 20 minutes ago  2 Had Stroke:  No  Had TIA:  Yes  Had Thromboembolism:  Yes    Current as of 20 minutes ago  1 Female:  Yes    Current as of 20 minutes ago   No AC due to GIB.   Influenza A Droplet isolation.  Tamiflu . Supportive care with antitussives and MDI.  History of pulmonary embolism Pt has GIB and I have held eliquis .   Sepsis [facility]) Patient meets sepsis criteria with vitals UTI and CBC. Will follow culture and sensitivity currently we will continue on Rocephin  IV. Coronary artery disease involving native coronary artery of native heart without angina pectoris Stable.  No AC or Antiplatelet currently.  Metoprolol / statin and imdur  held.  Per cardiology note patient is set to have a right and left heart cath.  Essential hypertension, benign Vitals:   11/12/23 0941 11/12/23 1030 11/12/23 1330  BP: 131/79 114/68 138/78  Stable  Home regimen includes losartan , Lasix , amlodipine , metoprolol , Imdur . Currently with blood pressure being well-controlled we will hold meds with as needed hydralazine  IV.  Hypothyroidism Home medications include levothyroxine  88 mcg which we will currently hold.  A.m. team to resume once patient is stable and able to take a diet. DNR (do not resuscitate)/DNI(Do Not Intubate) CODE STATUS DNR/DNI will verify. Cystitis Pt is s/p 3 days of levaquin  t/t .we will obtain culture.  Recent Results (from the past 720 hours)  Urine Culture     Status: None   Collection Time: 11/09/23  3:15 PM  Result Value Ref Range Status   Urine Culture, Routine Final report  Final   Organism ID, Bacteria Comment  Final    Comment: Culture shows less than 10,000 colony forming units of bacteria per milliliter of urine. This colony count is not generally considered to be clinically significant.   Resp panel by RT-PCR (RSV, Flu A&B, Covid) Anterior Nasal Swab     Status: Abnormal   Collection Time: 11/12/23  9:30 AM   Specimen:  Anterior Nasal Swab  Result Value Ref Range Status   SARS Coronavirus 2 by RT PCR NEGATIVE NEGATIVE Final   Influenza A by PCR POSITIVE (A) NEGATIVE Final   Influenza B by PCR NEGATIVE NEGATIVE Final    Comment: (NOTE) The Xpert Xpress SARS-CoV-2/FLU/RSV plus assay is intended as an aid in the diagnosis of influenza from Nasopharyngeal swab specimens and should not be used as a sole basis for treatment. Nasal washings and aspirates are unacceptable for Xpert Xpress SARS-CoV-2/FLU/RSV testing.  Fact Sheet for Patients: bloggercourse.com  Fact Sheet for Healthcare Providers: seriousbroker.it  This test is not yet approved or cleared by the [place]  FDA and has been authorized for detection and/or diagnosis of SARS-CoV-2 by FDA under an Emergency Use Authorization (EUA). This EUA will remain in effect (meaning this test can be used) for the duration of the COVID-19 declaration under Section 564(b)(1) of the Act, 21 U.S.C. section 360bbb-3(b)(1), unless the authorization is terminated or revoked.     Resp Syncytial Virus by PCR NEGATIVE NEGATIVE Final    Comment: (NOTE) Fact Sheet for Patients: bloggercourse.com  Fact Sheet for Healthcare Providers: seriousbroker.it  This test is not yet approved or cleared by the [place]  FDA and has been authorized for detection and/or diagnosis of SARS-CoV-2 by FDA under an Emergency Use Authorization (EUA). This EUA will remain in effect (meaning this test can be used) for the duration of the COVID-19 declaration under Section 564(b)(1) of the Act, 21 U.S.C. section 360bbb-3(b)(1), unless the authorization is terminated or revoked.  Performed at [facility], [address], [place], [place] [address]      DVT prophylaxis:  Scd's Consults:  Gi Brien. Advance Care Planning:    Code Status: Limited: Do not attempt  resuscitation (DNR) -DNR-LIMITED -Do Not Intubate/DNI    Family Communication:  None  Disposition Plan:  Home  Severity of Illness: The appropriate patient status for this patient is INPATIENT. Inpatient status is judged to be reasonable and necessary in order to provide the required intensity of service to ensure the patient's safety. The patient's presenting symptoms, physical exam findings, and initial radiographic and laboratory data in the context of their chronic comorbidities is felt to place them at high risk for further clinical deterioration. Furthermore, it is not anticipated that the patient will be medically stable for discharge from the hospital within 2 midnights  of admission.   * I certify that at the point of admission it is my clinical judgment that the patient will require inpatient hospital care spanning beyond 2 midnights from the point of admission due to high intensity of service, high risk for further deterioration and high frequency of surveillance required.*  Author: Mario LULLA Blanch, MD 11/12/2023 3:01 PM  For on call review [email or web address].   Unresulted Labs (From admission, onward)     Start     Ordered   11/12/23 1116  Urine Culture  Once,   R        11/12/23 1116            Orders Placed This Encounter  Procedures   Resp panel by RT-PCR (RSV, Flu A&B, Covid) Anterior Nasal Swab   Urine Culture   DG Chest Port 1 View   CBC with Differential   Urinalysis, w/ Reflex to Culture (Infection Suspected) -Urine, Clean Catch   Protime-INR   Comprehensive metabolic panel   Diet NPO time specified   Initiate Carrier Fluid Protocol   ED Cardiac monitoring   Consult to gastroenterology   Consult to hospitalist   POC occult blood, ED Provider will collect   ED EKG   EKG 12-Lead   Type and screen [facility]   Insert peripheral IV

## 2023-11-12 NOTE — Plan of Care (Signed)

## 2023-11-12 NOTE — ED Triage Notes (Signed)
 Pt BIB [facility] from home c/o black stools on and off for 2 weeks. Pt has been on Eliquis  for several years. Pt has also been experiencing diarrhea.

## 2023-11-12 NOTE — Assessment & Plan Note (Addendum)
 Patient meets sepsis criteria with vitals UTI and CBC. Will follow culture and sensitivity currently we will continue on Rocephin  IV.

## 2023-11-12 NOTE — Assessment & Plan Note (Signed)
 CODE STATUS DNR/DNI d/w pt about code status and pt is clear and oriented and wishes to be DNR and no resuscitation measure.s

## 2023-11-12 NOTE — Hospital Course (Signed)
 GIB FLUA UTI- keflex  and levaquin  . Sepsis UTI.

## 2023-11-12 NOTE — Assessment & Plan Note (Addendum)
 CODE STATUS DNR/DNI will verify.

## 2023-11-12 NOTE — Assessment & Plan Note (Addendum)
 2/2 to Eliquis .  We will hold same.  GI Consulted per EDMD.  Type/screen. IV PPI.  NPO except for Ice chips.

## 2023-11-12 NOTE — Assessment & Plan Note (Addendum)
 Rate controlled and we will monitor on telemetry.  CHA2DS2/VAS Stroke Risk Points  Current as of 20 minutes ago     8 >= 2 Points: High Risk  1 to 1.99 Points: Medium Risk  0 Points: Low Risk    Last Change: N/A      Details    This score determines the patient's risk of having a stroke if the  patient has atrial fibrillation.       Points Metrics  1 Has Congestive Heart Failure:  Yes    Current as of 20 minutes ago  1 Has Vascular Disease:  Yes    Current as of 20 minutes ago  1 Has Hypertension:  Yes    Current as of 20 minutes ago  2 Age:  88    Current as of 20 minutes ago  0 Has Diabetes Excluding Gestational Diabetes:  No    Current as of 20 minutes ago  2 Had Stroke:  No  Had TIA:  Yes  Had Thromboembolism:  Yes    Current as of 20 minutes ago  1 Female:  Yes    Current as of 20 minutes ago   No AC due to GIB.

## 2023-11-12 NOTE — Assessment & Plan Note (Addendum)
 Stable.  No AC or Antiplatelet currently.  Metoprolol / statin and imdur  held.  Per cardiology note patient is set to have a right and left heart cath.

## 2023-11-12 NOTE — Assessment & Plan Note (Addendum)
 Droplet isolation.  Tamiflu . Supportive care with antitussives and MDI.

## 2023-11-12 NOTE — ED Notes (Signed)
 Pt ambulated to the RR and back to the Room. Pt did well.

## 2023-11-12 NOTE — Assessment & Plan Note (Addendum)
 Suspect 2/2 to flu Patient meets sepsis criteria with vitals infection source and leucocytosis.  Will follow culture and sensitivity currently we will obtain culture and decide on continuation of therapy or test of cure.

## 2023-11-12 NOTE — ED Provider Notes (Signed)
 [place] EMERGENCY DEPARTMENT AT [facility] Provider Note   CSN: [id number] Arrival date & time: 11/12/23  9075     History  No chief complaint on file.   [name] is a 88 y.o. female.  Pt is a 88 yo female with pmhx significant for CAD, HTN, HLD, RA, thyroid  cancer, hx DVT and PE, Afib (on Eliquis ), and chf.  Pt has been feeling very weak.  She said her stools have been dark.  She did go to pcp on 2/4 and was dx'd with a uti.  She has been on levaquin  and keflex .  She thinks the medicine is making her hallucinate.  Pt has a cough, but no fever.       Home Medications Prior to Admission medications   Medication Sig Start Date End Date Taking? Authorizing Provider  Acetaminophen  (TYLENOL  PO) Take 1,000 mg by mouth 2 (two) times daily.    [provider]  amLODipine  (NORVASC ) 5 MG tablet TAKE 1 TABLET(5 MG) BY MOUTH DAILY 12/25/22   Lavona Agent, MD  atorvastatin  (LIPITOR) 40 MG tablet Take 1 tablet (40 mg total) by mouth daily. 09/05/21   Daneen Damien BROCKS, NP  Calcium  Carbonate-Vitamin D  (CALCIUM  + D PO) Take 600 mg by mouth 2 (two) times daily.    [provider]  cephALEXin  (KEFLEX ) 500 MG capsule Take 1 capsule (500 mg total) by mouth 4 (four) times daily for 7 days. 11/09/23 11/16/23  Amin, Saad, MD  ELIQUIS  2.5 MG TABS tablet TAKE 1 TABLET(2.5 MG) BY MOUTH TWICE DAILY 08/23/23   Lavona Agent, MD  furosemide  (LASIX ) 20 MG tablet TAKE 1 TABLET(20 MG) BY MOUTH DAILY Patient taking differently: Take 40 mg by mouth daily. 10/08/23   Daneen Damien BROCKS, NP  guaiFENesin  (MUCINEX ) 600 MG 12 hr tablet Take 1 tablet (600 mg total) by mouth 2 (two) times daily. 11/09/23   Amin, Saad, MD  isosorbide  mononitrate (IMDUR ) 30 MG 24 hr tablet Take 30 mg by mouth daily.    [provider]  levofloxacin  (LEVAQUIN ) 500 MG tablet Take 1 tablet (500 mg total) by mouth daily. 11/09/23   Amin, Saad, MD  levothyroxine  (SYNTHROID ) 88 MCG tablet Take 88 mcg by mouth  daily before breakfast.    [provider]  losartan  (COZAAR ) 100 MG tablet Take 0.5 tablets (50 mg total) by mouth daily. Patient not taking: Reported on 10/29/2023 03/22/23 10/29/23  Lavona Agent, MD  losartan  (COZAAR ) 50 MG tablet Take 50 mg by mouth daily. 09/18/23   [provider]  metoprolol  succinate (TOPROL -XL) 100 MG 24 hr tablet Take 100 mg by mouth 2 (two) times daily. Take with or immediately following a meal.    [provider]  Multiple Vitamins-Minerals (CENTRUM SILVER 50+WOMEN) TABS Take 1 tablet by mouth daily.    [provider]  nitroGLYCERIN  (NITROSTAT ) 0.4 MG SL tablet For chest pain, tightness, or pressure. While sitting, place 1 tablet under tongue. May be used every 5 minutes as needed, for up to 15 minutes. Do not use more than 3 tablets. 09/30/22   Lavona Agent, MD  olopatadine (PATANOL) 0.1 % ophthalmic solution Place 1 drop into both eyes daily.    [provider]  Polyethyl Glycol-Propyl Glycol (SYSTANE OP) Place 1 drop into both eyes daily as needed (dry eyes).    [provider]      Allergies    Hydrocodone and Prednisone    Review of Systems   Review of Systems  Respiratory:  Positive for cough.   Gastrointestinal:        Dark stool  Neurological:  Positive for weakness.  All other systems reviewed and are negative.   Physical Exam Updated Vital Signs BP 138/78   Pulse 94   Temp 98.6 F (37 C) (Oral)   Resp (!) 22   Ht 5' 3 (1.6 m)   Wt 51.3 kg   SpO2 96%   BMI 20.02 kg/m  Physical Exam Vitals and nursing note reviewed. Exam conducted with a chaperone present.  Constitutional:      Appearance: Normal appearance.  HENT:     Head: Normocephalic and atraumatic.     Right Ear: External ear normal.     Left Ear: External ear normal.     Nose: Nose normal.     Mouth/Throat:     Mouth: Mucous membranes are moist.     Pharynx: Oropharynx is clear.  Eyes:     Extraocular Movements:  Extraocular movements intact.     Conjunctiva/sclera: Conjunctivae normal.     Pupils: Pupils are equal, round, and reactive to light.  Cardiovascular:     Rate and Rhythm: Normal rate. Rhythm irregular.     Pulses: Normal pulses.     Heart sounds: Normal heart sounds.  Pulmonary:     Effort: Pulmonary effort is normal.     Breath sounds: Normal breath sounds.  Abdominal:     General: Abdomen is flat. Bowel sounds are normal.     Palpations: Abdomen is soft.  Genitourinary:    Rectum: Guaiac result positive. External hemorrhoid present.  Musculoskeletal:        General: Normal range of motion.     Cervical back: Normal range of motion and neck supple.  Skin:    General: Skin is warm.     Capillary Refill: Capillary refill takes less than 2 seconds.  Neurological:     General: No focal deficit present.     Mental Status: She is alert and oriented to person, place, and time.  Psychiatric:        Mood and Affect: Mood normal.        Behavior: Behavior normal.     ED Results / Procedures / Treatments   Labs (all labs ordered are listed, but only abnormal results are displayed) Labs Reviewed  RESP PANEL BY RT-PCR (RSV, FLU A&B, COVID)  RVPGX2 - Abnormal; Notable for the following components:      Result Value   Influenza A by PCR POSITIVE (*)    All other components within normal limits  CBC WITH DIFFERENTIAL/PLATELET - Abnormal; Notable for the following components:   Lymphs Abs 0.5 (*)    All other components within normal limits  URINALYSIS, W/ REFLEX TO CULTURE (INFECTION SUSPECTED) - Abnormal; Notable for the following components:   APPearance HAZY (*)    Leukocytes,Ua MODERATE (*)    Bacteria, UA FEW (*)    Non Squamous Epithelial 0-5 (*)    All other components within normal limits  PROTIME-INR - Abnormal; Notable for the following components:   Prothrombin Time 17.5 (*)    INR 1.4 (*)    All other components within normal limits  COMPREHENSIVE METABOLIC PANEL -  Abnormal; Notable for the following components:   Sodium 126 (*)    Chloride 85 (*)    Calcium  8.4 (*)    Albumin 3.1 (*)    All other components within normal limits  POC OCCULT BLOOD, ED - Abnormal; Notable  for the following components:   Fecal Occult Bld POSITIVE (*)    All other components within normal limits  URINE CULTURE  TYPE AND SCREEN  TROPONIN I (HIGH SENSITIVITY)  TROPONIN I (HIGH SENSITIVITY)    EKG EKG Interpretation Date/Time:  Friday November 12 2023 09:42:14 EST Ventricular Rate:  89 PR Interval:    QRS Duration:  67 QT Interval:  347 QTC Calculation: 423 R Axis:   53  Text Interpretation: Atrial fibrillation No significant change since last tracing Confirmed by Dean Clarity [phone]) on 11/12/2023 10:37:56 AM  Radiology DG Chest Port 1 View Result Date: 11/12/2023 CLINICAL DATA:  Intermittent melena EXAM: PORTABLE CHEST 1 VIEW COMPARISON:  Chest radiograph dated 07/09/2023 FINDINGS: Normal lung volumes. No focal consolidations. No pleural effusion or pneumothorax. Similar enlarged cardiomediastinal silhouette. No acute osseous abnormality. Surgical clips project over the neck. IMPRESSION: 1. No acute disease. 2. Similar cardiomegaly. Electronically Signed   By: Limin  Xu M.D.   On: 11/12/2023 10:19    Procedures Procedures    Medications Ordered in ED Medications  oseltamivir  (TAMIFLU ) capsule 75 mg (has no administration in time range)  pantoprazole  (PROTONIX ) injection 40 mg (40 mg Intravenous Given 11/12/23 1055)    ED Course/ Medical Decision Making/ A&P                                 Medical Decision Making Amount and/or Complexity of Data Reviewed Labs: ordered. Radiology: ordered.  Risk Prescription drug management. Decision regarding hospitalization.   This patient presents to the ED for concern of gi bleed, this involves an extensive number of treatment options, and is a complaint that carries with it a high risk of complications and  morbidity.  The differential diagnosis includes upper, lower gi bleed   Co morbidities that complicate the patient evaluation  CAD, HTN, HLD, RA, thyroid  cancer, hx DVT and PE, Afib (on Eliquis ), and chf   Additional history obtained:  Additional history obtained from epic chart review External records from outside source obtained and reviewed including EMS report   Lab Tests:  I Ordered, and personally interpreted labs.  The pertinent results include:  flu a +, cbc nl, cmp with na low at 126 (chronic); inr 1.4, trop nl   Imaging Studies ordered:  I ordered imaging studies including cxr  I independently visualized and interpreted imaging which showed  No acute disease.  2. Similar cardiomegaly.   I agree with the radiologist interpretation   Cardiac Monitoring:  The patient was maintained on a cardiac monitor.  I personally viewed and interpreted the cardiac monitored which showed an underlying rhythm of: af   Medicines ordered and prescription drug management:  I ordered medication including protonix   for ugi bleed  Reevaluation of the patient after these medicines showed that the patient improved I have reviewed the patients home medicines and have made adjustments as needed  Consultations Obtained:  I requested consultation with the gastroenterologists  ([facility]),  and discussed lab and imaging findings as well as pertinent plan - they will see pt in consult Pt d/w Dr. CHARLENA Blanch ([facility]) for admission.   Problem List / ED Course:  Influenza A:  pt is not hypoxic and no pna on cxr.  Tamiflu  started. Upper GI bleed:  will need to hold eliquis ; iv protonix  given.  [facility] GI to see Afib:    CHA2DS2/VAS Stroke Risk Points  Current as of 44 minutes  ago     8 >= 2 Points: High Risk  1 to 1.99 Points: Medium Risk  0 Points: Low Risk    Last Change: N/A      Details    This score determines the patient's risk of having a stroke if the  patient has atrial fibrillation.        Points Metrics  1 Has Congestive Heart Failure:  Yes    Current as of 44 minutes ago  1 Has Vascular Disease:  Yes    Current as of 44 minutes ago  1 Has Hypertension:  Yes    Current as of 44 minutes ago  2 Age:  64    Current as of 44 minutes ago  0 Has Diabetes Excluding Gestational Diabetes:  No    Current as of 44 minutes ago  2 Had Stroke:  No  Had TIA:  Yes  Had Thromboembolism:  Yes    Current as of 44 minutes ago  1 Female:  Yes    Current as of 44 minutes ago       Hyponatremia: chronic      Reevaluation:  After the interventions noted above, I reevaluated the patient and found that they have :improved   Social Determinants of Health:  Lives at home   Dispostion:  After consideration of the diagnostic results and the patients response to treatment, I feel that the patent would benefit from admission.          Final Clinical Impression(s) / ED Diagnoses Final diagnoses:  Influenza A  Upper GI bleed  On apixaban  therapy  Hyponatremia    Rx / DC Orders ED Discharge Orders     None         Dean Clarity, MD 11/12/23 1402

## 2023-11-12 NOTE — Assessment & Plan Note (Addendum)
 Pt is s/p 3 days of levaquin  t/t .we will obtain culture.  Recent Results (from the past 720 hours)  Urine Culture     Status: None   Collection Time: 11/09/23  3:15 PM  Result Value Ref Range Status   Urine Culture, Routine Final report  Final   Organism ID, Bacteria Comment  Final    Comment: Culture shows less than 10,000 colony forming units of bacteria per milliliter of urine. This colony count is not generally considered to be clinically significant.   Resp panel by RT-PCR (RSV, Flu A&B, Covid) Anterior Nasal Swab     Status: Abnormal   Collection Time: 11/12/23  9:30 AM   Specimen: Anterior Nasal Swab  Result Value Ref Range Status   SARS Coronavirus 2 by RT PCR NEGATIVE NEGATIVE Final   Influenza A by PCR POSITIVE (A) NEGATIVE Final   Influenza B by PCR NEGATIVE NEGATIVE Final    Comment: (NOTE) The Xpert Xpress SARS-CoV-2/FLU/RSV plus assay is intended as an aid in the diagnosis of influenza from Nasopharyngeal swab specimens and should not be used as a sole basis for treatment. Nasal washings and aspirates are unacceptable for Xpert Xpress SARS-CoV-2/FLU/RSV testing.  Fact Sheet for Patients: bloggercourse.com  Fact Sheet for Healthcare Providers: seriousbroker.it  This test is not yet approved or cleared by the [place]  FDA and has been authorized for detection and/or diagnosis of SARS-CoV-2 by FDA under an Emergency Use Authorization (EUA). This EUA will remain in effect (meaning this test can be used) for the duration of the COVID-19 declaration under Section 564(b)(1) of the Act, 21 U.S.C. section 360bbb-3(b)(1), unless the authorization is terminated or revoked.     Resp Syncytial Virus by PCR NEGATIVE NEGATIVE Final    Comment: (NOTE) Fact Sheet for Patients: bloggercourse.com  Fact Sheet for Healthcare Providers: seriousbroker.it  This test is not yet  approved or cleared by the [place]  FDA and has been authorized for detection and/or diagnosis of SARS-CoV-2 by FDA under an Emergency Use Authorization (EUA). This EUA will remain in effect (meaning this test can be used) for the duration of the COVID-19 declaration under Section 564(b)(1) of the Act, 21 U.S.C. section 360bbb-3(b)(1), unless the authorization is terminated or revoked.  Performed at [facility], [address], [place], [place] [address]

## 2023-11-12 NOTE — Assessment & Plan Note (Addendum)
 Pt has GIB and I have held eliquis .

## 2023-11-12 NOTE — Assessment & Plan Note (Addendum)
 Home medications include levothyroxine  88 mcg which we will currently hold.  A.m. team to resume once patient is stable and able to take a diet.

## 2023-11-12 NOTE — Consult Note (Signed)
 Consultation Note   Referring Provider:  [facility] PCP: Nichole Senior, MD Primary Gastroenterologist: Lupita Commander, MD        Reason for Consultation:  melena  DOA: 11/12/2023         Hospital Day: 1   ASSESSMENT    Brief Narrative:  88 y.o. year old female with a history of  A-fib/PE on Eliquis , CAD, carotid stenosis, , hypertension, RA,  chronic diastolic congestive heart failure, moderate aortic stenosis, DNR and DNI   Dark, FOBT+ stool x 1 week (on Eliquis ) without any decline in hgb.   Hgb stable at 12.8 ( at baseline) and normal BUN. No bismuth or oral iron use. No associated GI symptoms other than involuntary weight loss. Rule out PUD, AVMs, intestinal neoplasm)  Influenza A.  Productive cough. CXR without acute findings.  To start Tamiflu   Dyspnea CAD / chronic diastolic heart failure.  Chronic but with recent progression of dyspnea.  Had R/L cardiac cath last week, see results below. Medical therapy recommended. CXR showing cardiomegaly but acute findings  ? Sepsis, ( meets criteria) per admitting team.  Possible UTI.   PAF, rate controlled  History of PE, on Eliquis  Last dose of Eliquis  was this am  Chronic hyponatremia Na 126   Active Problems:   Essential hypertension, benign   History of pulmonary embolism   Hypothyroidism   Coronary artery disease involving native coronary artery of native heart without angina pectoris   PAF (paroxysmal atrial fibrillation) (HCC)   DNR (do not resuscitate)/DNI(Do Not Intubate)   Melena   Influenza A   Sepsis (HCC)   Cystitis     PLAN:   --Admitting team raised concern for sepsis, ? Maybe related to a UTI. Plan was to continue Rocephin  but I dont' see it was ordered? Will defer to admitting team.  --Received dose of pantoprazole  IV this am but no additional  doses orders. Will order 40 mg IV BID --Repeat H/H now ( last one was around 1030 am) and another in the  am.  --Eliquis  is being held, last dose was this am --Will likely need EGD at some point this admission but she is at increased risk for sedation / procedures with age and co-morbidities. The dyspnea is worrisome.   HPI   Patient began having loose black stools a week ago. Has had a black BM almost everyday since onset. No bismuth or oral iron use. No N/V or abdominal pain. No NSAID use.  She had COVID in August. Since then hasn't felt well. Appetite has been poor. She has lost an undetermined amount of weight. Her dyspnea has been getting progressively worse.  She has been seeing her Cardiologist for the progressive dyspnea. Underwent cardiac cath on 11/04/23.  She has a productive cough, tests positive for the flu.    Previous GI Evaluations   Last colonoscopy was in 2004 -Diverticulosis and hemorrhoids  Labs and Imaging: Recent Labs    11/12/23 1030  WBC 4.5  HGB 12.8  HCT 37.2  PLT 160   Recent Labs    11/12/23 1151  NA 126*  K 4.3  CL 85*  CO2 28  GLUCOSE 88  BUN 20  CREATININE 0.81  CALCIUM  8.4*  Recent Labs    11/12/23 1151  PROT 7.2  ALBUMIN 3.1*  AST 40  ALT 21  ALKPHOS 55  BILITOT 1.0   No results for input(s): HEPBSAG, HCVAB, HEPAIGM, HEPBIGM in the last 72 hours. Recent Labs    11/12/23 1151  LABPROT 17.5*  INR 1.4*   Right / Left heart cath 11/04/23 Mid RCA lesion is 100% stenosed.   Mid Cx lesion is 50% stenosed.   Mid LAD lesion is 100% stenosed.   Prox LAD to Mid LAD lesion is 50% stenosed.   Ost LAD to Prox LAD lesion is 70% stenosed.   1.  Chronic total occlusion of right coronary artery collateralized via left circumflex. 2.  Chronic total occlusion of mid LAD. 3.  Moderate obstructive disease of proximal LAD; this should be treated medically. 4.  Mean gradient across the aortic valve of 20 mmHg consistent with moderate aortic stenosis 5.  Fick cardiac output of 4.5 L/min and Fick cardiac index of 2.9 L/min/m with the  following hemodynamics:            Right atrial pressure mean of 9 mmHg            Right ventricular pressure 45/-1 with an end-diastolic pressure of 7 mmHg            Wedge pressure mean of 16 mmHg with V waves to 20 mmHg            PA pressure 47/19 with a mean of 31 mmHg            PVR of 3.3 Woods units            PA pulsatility index of 3.1 6.  LVEDP of 17 mmHg   Recommendation: Medical therapy.  Past Medical History:  Diagnosis Date   Aortic stenosis    Atrial fibrillation (HCC)    CAD, UNSPECIFIED SITE    2009. 99% proximal RCA stenosis followed by 80% distal stenosis. The LAD has a 100% stenosis in the midsegment. The circumflex is patent with distal occlusion. She has been managed medically. Her EF was well-preserved.   CAROTID STENOSIS    Chronic diastolic (congestive) heart failure (HCC)    DIVERTICULAR DISEASE    Elevated troponin- 0.58 in setting of PSVT 10/29/2013   Essential hypertension, benign    Gastritis    H/O blood clots 1984   DVT and PE   Hx of degenerative disc disease    HYPERLIPIDEMIA    MACULAR DEGENERATION    OSTEOPOROSIS    Rheumatoid arthritis(714.0)    Thyroid  cancer (HCC) 2008    Past Surgical History:  Procedure Laterality Date   ABDOMINAL HYSTERECTOMY  1984   APPENDECTOMY  1959   BREAST CYST EXCISION     left   CARDIOVERSION N/A 08/13/2021   Procedure: CARDIOVERSION;  Surgeon: Raford Riggs, MD;  Location: [facility] ENDOSCOPY;  Service: Cardiovascular;  Laterality: N/A;   CATARACT EXTRACTION, BILATERAL     CHOLECYSTECTOMY  2009   COLONOSCOPY     ESOPHAGOGASTRODUODENOSCOPY     IM NAILING FEMORAL SHAFT RETROGRADE  2010   left   RIGHT/LEFT HEART CATH AND CORONARY ANGIOGRAPHY N/A 11/04/2023   Procedure: RIGHT/LEFT HEART CATH AND CORONARY ANGIOGRAPHY;  Surgeon: Wendel Lurena POUR, MD;  Location: [facility] INVASIVE CV LAB;  Service: Cardiovascular;  Laterality: N/A;   TONSILLECTOMY     WRIST SURGERY  2004   Left    Family History  Problem Relation  Age of Onset  Heart attack Mother    Clotting disorder Mother    Heart disease Father    Diabetes Father    CAD Sister    Stroke Sister    Lung cancer Brother    Ovarian cancer Maternal Aunt    Lung cancer Maternal Uncle    Diabetes Niece     Prior to Admission medications   Medication Sig Start Date End Date Taking? Authorizing Provider  Acetaminophen  (TYLENOL  PO) Take 1,000 mg by mouth 2 (two) times daily.    [provider]  amLODipine  (NORVASC ) 5 MG tablet TAKE 1 TABLET(5 MG) BY MOUTH DAILY 12/25/22   Lavona Agent, MD  atorvastatin  (LIPITOR) 40 MG tablet Take 1 tablet (40 mg total) by mouth daily. 09/05/21   Daneen Damien BROCKS, NP  Calcium  Carbonate-Vitamin D  (CALCIUM  + D PO) Take 600 mg by mouth 2 (two) times daily.    [provider]  cephALEXin  (KEFLEX ) 500 MG capsule Take 1 capsule (500 mg total) by mouth 4 (four) times daily for 7 days. 11/09/23 11/16/23  Amin, Saad, MD  ELIQUIS  2.5 MG TABS tablet TAKE 1 TABLET(2.5 MG) BY MOUTH TWICE DAILY 08/23/23   Lavona Agent, MD  furosemide  (LASIX ) 20 MG tablet TAKE 1 TABLET(20 MG) BY MOUTH DAILY Patient taking differently: Take 40 mg by mouth daily. 10/08/23   Daneen Damien BROCKS, NP  guaiFENesin  (MUCINEX ) 600 MG 12 hr tablet Take 1 tablet (600 mg total) by mouth 2 (two) times daily. 11/09/23   Amin, Saad, MD  isosorbide  mononitrate (IMDUR ) 30 MG 24 hr tablet Take 30 mg by mouth daily.    [provider]  levofloxacin  (LEVAQUIN ) 500 MG tablet Take 1 tablet (500 mg total) by mouth daily. 11/09/23   Amin, Saad, MD  levothyroxine  (SYNTHROID ) 88 MCG tablet Take 88 mcg by mouth daily before breakfast.    [provider]  losartan  (COZAAR ) 100 MG tablet Take 0.5 tablets (50 mg total) by mouth daily. Patient not taking: Reported on 10/29/2023 03/22/23 10/29/23  Lavona Agent, MD  losartan  (COZAAR ) 50 MG tablet Take 50 mg by mouth daily. 09/18/23   [provider]  metoprolol  succinate (TOPROL -XL) 100 MG 24 hr  tablet Take 100 mg by mouth 2 (two) times daily. Take with or immediately following a meal.    [provider]  Multiple Vitamins-Minerals (CENTRUM SILVER 50+WOMEN) TABS Take 1 tablet by mouth daily.    [provider]  nitroGLYCERIN  (NITROSTAT ) 0.4 MG SL tablet For chest pain, tightness, or pressure. While sitting, place 1 tablet under tongue. May be used every 5 minutes as needed, for up to 15 minutes. Do not use more than 3 tablets. 09/30/22   Lavona Agent, MD  olopatadine (PATANOL) 0.1 % ophthalmic solution Place 1 drop into both eyes daily.    [provider]  Polyethyl Glycol-Propyl Glycol (SYSTANE OP) Place 1 drop into both eyes daily as needed (dry eyes).    [provider]    No current facility-administered medications for this encounter.   Current Outpatient Medications  Medication Sig Dispense Refill   Acetaminophen  (TYLENOL  PO) Take 1,000 mg by mouth 2 (two) times daily.     amLODipine  (NORVASC ) 5 MG tablet TAKE 1 TABLET(5 MG) BY MOUTH DAILY 90 tablet 3   atorvastatin  (LIPITOR) 40 MG tablet Take 1 tablet (40 mg total) by mouth daily. 90 tablet 3   Calcium  Carbonate-Vitamin D  (CALCIUM  + D PO) Take 600 mg by mouth 2 (two) times daily.  cephALEXin  (KEFLEX ) 500 MG capsule Take 1 capsule (500 mg total) by mouth 4 (four) times daily for 7 days. 28 capsule 0   ELIQUIS  2.5 MG TABS tablet TAKE 1 TABLET(2.5 MG) BY MOUTH TWICE DAILY 180 tablet 1   furosemide  (LASIX ) 20 MG tablet TAKE 1 TABLET(20 MG) BY MOUTH DAILY (Patient taking differently: Take 40 mg by mouth daily.) 90 tablet 3   guaiFENesin  (MUCINEX ) 600 MG 12 hr tablet Take 1 tablet (600 mg total) by mouth 2 (two) times daily. 30 tablet 0   isosorbide  mononitrate (IMDUR ) 30 MG 24 hr tablet Take 30 mg by mouth daily.     levofloxacin  (LEVAQUIN ) 500 MG tablet Take 1 tablet (500 mg total) by mouth daily. 7 tablet 0   levothyroxine  (SYNTHROID ) 88 MCG tablet Take 88 mcg by mouth daily before  breakfast.     losartan  (COZAAR ) 100 MG tablet Take 0.5 tablets (50 mg total) by mouth daily. (Patient not taking: Reported on 10/29/2023) 90 tablet 3   losartan  (COZAAR ) 50 MG tablet Take 50 mg by mouth daily.     metoprolol  succinate (TOPROL -XL) 100 MG 24 hr tablet Take 100 mg by mouth 2 (two) times daily. Take with or immediately following a meal.     Multiple Vitamins-Minerals (CENTRUM SILVER 50+WOMEN) TABS Take 1 tablet by mouth daily.     nitroGLYCERIN  (NITROSTAT ) 0.4 MG SL tablet For chest pain, tightness, or pressure. While sitting, place 1 tablet under tongue. May be used every 5 minutes as needed, for up to 15 minutes. Do not use more than 3 tablets. 25 tablet 3   olopatadine (PATANOL) 0.1 % ophthalmic solution Place 1 drop into both eyes daily.     Polyethyl Glycol-Propyl Glycol (SYSTANE OP) Place 1 drop into both eyes daily as needed (dry eyes).      Allergies as of 11/12/2023 - Review Complete 11/12/2023  Allergen Reaction Noted   Hydrocodone Other (See Comments)    Prednisone Nausea And Vomiting     Social History   Socioeconomic History   Marital status: Married    Spouse name: Not on file   Number of children: 1   Years of education: Not on file   Highest education level: Not on file  Occupational History   Occupation: retired    Comment: diplomatic services operational officer  Tobacco Use   Smoking status: Never   Smokeless tobacco: Never  Vaping Use   Vaping status: Never Used  Substance and Sexual Activity   Alcohol use: No   Drug use: No   Sexual activity: Not Currently    Partners: Male  Other Topics Concern   Not on file  Social History Narrative   Married, retired1 daughter   Right handed   1 story   Social Drivers of Corporate Investment Banker Strain: Not on file  Food Insecurity: Not on file  Transportation Needs: Not on file  Physical Activity: Not on file  Stress: Not on file  Social Connections: Not on file  Intimate Partner Violence: Not on file     Code  Status   Code Status: Limited: Do not attempt resuscitation (DNR) -DNR-LIMITED -Do Not Intubate/DNI   Review of Systems: All systems reviewed and negative except where noted in HPI.  Physical Exam: Vital signs in last 24 hours: Temp:  [97.4 F (36.3 C)-98.6 F (37 C)] 98.6 F (37 C) (02/07 1345) Pulse Rate:  [83-94] 94 (02/07 1330) Resp:  [20-31] 22 (02/07 1330) BP: (114-138)/(68-79) 138/78 (02/07 1330) SpO2:  [  96 %-98 %] 96 % (02/07 1330) Weight:  [51.3 kg] 51.3 kg (02/07 0931)    General:  Pleasant female in NAD Psych:  Cooperative. Normal mood and affect Eyes: Pupils equal Ears:  Normal auditory acuity Nose: No deformity, discharge or lesions Neck:  Supple, no masses felt Lungs:  decreased breath sounds bilaterally. Occasional wheezing.   Heart:  Regular rate.  Abdomen:  Soft, nondistended, nontender, active bowel sounds, no masses felt Rectal :  Deferred Msk: Symmetrical without gross deformities.  Neurologic:  Alert, oriented, grossly normal neurologically Extremities : No edema Skin:  Intact without significant lesions.    Intake/Output from previous day: No intake/output data recorded. Intake/Output this shift:  No intake/output data recorded.   Vina Dasen, NP-C   11/12/2023, 3:26 PM

## 2023-11-13 DIAGNOSIS — K921 Melena: Secondary | ICD-10-CM | POA: Diagnosis not present

## 2023-11-13 DIAGNOSIS — D6832 Hemorrhagic disorder due to extrinsic circulating anticoagulants: Secondary | ICD-10-CM | POA: Diagnosis not present

## 2023-11-13 LAB — HEMOGLOBIN AND HEMATOCRIT, BLOOD
HCT: 38 % (ref 36.0–46.0)
Hemoglobin: 13.4 g/dL (ref 12.0–15.0)

## 2023-11-13 MED ORDER — FUROSEMIDE 20 MG PO TABS
20.0000 mg | ORAL_TABLET | Freq: Two times a day (BID) | ORAL | Status: DC
  Administered 2023-11-13 – 2023-11-14 (×2): 20 mg via ORAL
  Filled 2023-11-13 (×2): qty 1

## 2023-11-13 MED ORDER — CALCIUM CARBONATE ANTACID 500 MG PO CHEW
1.0000 | CHEWABLE_TABLET | Freq: Two times a day (BID) | ORAL | Status: DC | PRN
  Administered 2023-11-13: 200 mg via ORAL
  Filled 2023-11-13: qty 1

## 2023-11-13 MED ORDER — LEVOTHYROXINE SODIUM 88 MCG PO TABS
88.0000 ug | ORAL_TABLET | Freq: Every day | ORAL | Status: DC
  Administered 2023-11-14: 88 ug via ORAL
  Filled 2023-11-13: qty 1

## 2023-11-13 MED ORDER — SODIUM CHLORIDE 0.9 % IV SOLN: 1.0000 g | INTRAVENOUS | Status: DC

## 2023-11-13 MED ORDER — APIXABAN 2.5 MG PO TABS
2.5000 mg | ORAL_TABLET | Freq: Two times a day (BID) | ORAL | Status: DC
  Administered 2023-11-13 – 2023-11-14 (×2): 2.5 mg via ORAL
  Filled 2023-11-13 (×2): qty 1

## 2023-11-13 MED ORDER — ISOSORBIDE MONONITRATE ER 30 MG PO TB24
30.0000 mg | ORAL_TABLET | Freq: Every morning | ORAL | Status: DC
  Administered 2023-11-14: 30 mg via ORAL
  Filled 2023-11-13: qty 1

## 2023-11-13 MED ORDER — ATORVASTATIN CALCIUM 40 MG PO TABS
40.0000 mg | ORAL_TABLET | Freq: Every day | ORAL | Status: DC
  Administered 2023-11-13: 40 mg via ORAL
  Filled 2023-11-13: qty 1

## 2023-11-13 MED ORDER — PANTOPRAZOLE SODIUM 40 MG PO TBEC
40.0000 mg | DELAYED_RELEASE_TABLET | Freq: Every day | ORAL | Status: DC
  Administered 2023-11-14: 40 mg via ORAL
  Filled 2023-11-13: qty 1

## 2023-11-13 MED ORDER — LEVOFLOXACIN 500 MG PO TABS
500.0000 mg | ORAL_TABLET | ORAL | Status: DC
  Administered 2023-11-13 – 2023-11-14 (×2): 500 mg via ORAL
  Filled 2023-11-13 (×2): qty 1

## 2023-11-13 MED ORDER — OSELTAMIVIR PHOSPHATE 30 MG PO CAPS
30.0000 mg | ORAL_CAPSULE | Freq: Two times a day (BID) | ORAL | Status: DC
  Administered 2023-11-13 – 2023-11-14 (×3): 30 mg via ORAL
  Filled 2023-11-13 (×4): qty 1

## 2023-11-13 MED ORDER — METOPROLOL SUCCINATE ER 50 MG PO TB24
100.0000 mg | ORAL_TABLET | Freq: Every day | ORAL | Status: DC
  Administered 2023-11-13 – 2023-11-14 (×2): 100 mg via ORAL
  Filled 2023-11-13 (×2): qty 2

## 2023-11-13 NOTE — Plan of Care (Signed)

## 2023-11-13 NOTE — Progress Notes (Addendum)
 PROGRESS NOTE    [name]  FMW:[id number]  DOB: 04/23/1936  DOA: 11/12/2023 PCP: Nichole Senior, MD Outpatient Specialists:   Hospital course:  88 year old female with HTN, RA, A-fib on Eliquis , CAD, HFpEF, macular degeneration, H/O TIA and chronic shortness of breath and subacute cough was admitted yesterday for weakness and black stools x 2 weeks.  Workup was notable for heme positive stools, stable H&H, abnormal UA, positive influenza screen, chest x-ray was negative and she was started on Tamiflu .    Subjective:  Patient and husband would like to know when they can go home.  I noted that her O2 saturation seem to be dropping and asked if she was more short of breath.  Patient states that she has been short of breath for 2 years.  She has also had a cough for a few weeks and has been treated with antibiotics by her PCP however the cough is persisted.  Patient does not think she is more short of breath than usual and husband agrees.  There has been no change in her baseline cough  Objective: Vitals:   11/12/23 2123 11/13/23 0325 11/13/23 0820 11/13/23 1148  BP: (!) 150/97 (!) 143/98 (!) 153/100 123/88  Pulse: 88 89 89 95  Resp: 18 18 18 18   Temp: 97.9 F (36.6 C) 98 F (36.7 C) 97.9 F (36.6 C) 97.9 F (36.6 C)  TempSrc: Oral  Oral Oral  SpO2: 92% 92% 98% 91%  Weight:      Height:        Intake/Output Summary (Last 24 hours) at 11/13/2023 1456 Last data filed at 11/12/2023 2343 Gross per 24 hour  Intake 36.89 ml  Output --  Net 36.89 ml   Filed Weights   11/12/23 0931  Weight: 51.3 kg     Exam:  General: Patient lying in bed at 20 degrees with mildly labored breathing, occasional pursed lip breathing with flaring of nostrils, able to speak in full sentences that her short. Eyes: sclera anicteric, conjuctiva mild injection bilaterally CVS: S1-S2, regular  Respiratory: Coarse sounds  GI: NABS, soft, NT  LE: Warm and well-perfused Psych: Patient appears  irritable  Data Reviewed:  Basic Metabolic Panel: Recent Labs  Lab 11/12/23 1151  NA 126*  K 4.3  CL 85*  CO2 28  GLUCOSE 88  BUN 20  CREATININE 0.81  CALCIUM  8.4*    CBC: Recent Labs  Lab 11/12/23 1030 11/12/23 1640 11/13/23 0559  WBC 4.5  --   --   NEUTROABS 3.4  --   --   HGB 12.8 12.3 13.4  HCT 37.2 35.6* 38.0  MCV 94.4  --   --   PLT 160  --   --      Scheduled Meds:  apixaban   2.5 mg Oral BID   atorvastatin   40 mg Oral QHS   [START ON 11/14/2023] levothyroxine   88 mcg Oral Q0600   oseltamivir   30 mg Oral BID   [START ON 11/14/2023] pantoprazole   40 mg Oral Daily   Continuous Infusions:  cefTRIAXone  (ROCEPHIN )  IV     lactated ringers  20 mL/hr at 11/12/23 1832     Assessment & Plan:   Guaiac positive dark stools on Eliquis  Patient seen by gastroenterology today who do not believe there is any active bleeding, H&H have been stable and BUN is normal.  No further workup is required in house per their recommendations. Will restart Eliquis  Resume normal diet Pantoprazole  40 mg daily x 30 days  per GI recommendations Patient to follow-up with PCP and outpatient GI as required  Shortness of breath x 2 years Cough x few weeks treated with outpatient antibiotics Influenza A Patient does not feel that her shortness of breath or cough is any worse than previously Of note O2 sats were in the high 90s on admission yesterday, is 91 today Possibly secondary to influenza vs atelectasis on top of decreased reserve given chronic SOB  Will give incentive spirometer If O2 sats are still lower tomorrow, will repeat chest x-ray and ambulate patient with O2 monitor Patient however and husband would like for her to go home Continue Tamiflu  initiated in the ED Continue levofloxacin  for cough per patient report per outpatient regimen  Abnormal UA Urine culture is ordered and pending Continue levofloxacin  as started in outpatient for cough  PAF Will restart patient's  Toprol -XL 100 for rate control, however will dose as 100 daily rather than twice daily If patient's BP tolerates, can increase to twice daily tomorrow Eliquis  restarted  HFpEF HTN Will restart Lasix  20 twice daily per home doses Metoprolol  restarted as above Will hold amlodipine , losartan  given normotensive off meds  H/o PE/VTE Continue Eliquis   Chronic hyponatremia Sodium is at baseline   DVT prophylaxis: Eliquis  Code Status: DNR Family Communication: Husband was at bedside throughout     Studies: DG Chest Port 1 View Result Date: 11/12/2023 CLINICAL DATA:  Intermittent melena EXAM: PORTABLE CHEST 1 VIEW COMPARISON:  Chest radiograph dated 07/09/2023 FINDINGS: Normal lung volumes. No focal consolidations. No pleural effusion or pneumothorax. Similar enlarged cardiomediastinal silhouette. No acute osseous abnormality. Surgical clips project over the neck. IMPRESSION: 1. No acute disease. 2. Similar cardiomegaly. Electronically Signed   By: Limin  Xu M.D.   On: 11/12/2023 10:19    Principal Problem:   Melena Active Problems:   PAF (paroxysmal atrial fibrillation) (HCC)   Influenza A   History of pulmonary embolism   Sepsis (HCC)   Coronary artery disease involving native coronary artery of native heart without angina pectoris   Essential hypertension, benign   Hypothyroidism   DNR (do not resuscitate)/DNI(Do Not Intubate)   Cystitis     Justin Meisenheimer Vangie Pike, [facility]  If 7PM-7AM, please contact night-coverage [email or web address]   LOS: 1 day

## 2023-11-13 NOTE — Progress Notes (Addendum)
 Daily Progress Note  DOA: 11/12/2023 Hospital Day: 2   Chief Complaint: Black, Heme + stool  ASSESSMENT    Brief Narrative:  [name] is a 88 y.o. year old female with a history of  A-fib/PE on Eliquis , CAD, carotid stenosis, , hypertension, RA,  chronic diastolic congestive heart failure, moderate aortic stenosis, DNR and DNI . Admited with black , heme + stool. GI saw in consult 2/7   Dark, FOBT+ stool x 1 week (on Eliquis ) without any decline in hgb.   Cause unclear. No active bleeding. No BMs since admission. Hgb stable at 13.4 ( above baseline) and BUN is normal .   Diminished appetite / weight loss. Cause unclear.  Husband tells me she she never did recover from Covid in August, now has Flu   Influenza A.  Productive cough. CXR without acute findings.  On Tamiflu    Dyspnea CAD / chronic diastolic heart failure.  Chronic but with recent progression of dyspnea.  Had R/L cardiac cath last week, see results below. Medical therapy recommended. CXR showing cardiomegaly but acute findings   Possible UTI urine cx pending Yesterday's admission note mentioned continuing Rocephin  but I don't see that she is getting antibiotics   PAF, rate controlled   History of PE, on Eliquis  Last dose of Eliquis  was this am   Chronic hyponatremia Na 126   Principal Problem:   Melena Active Problems:   Essential hypertension, benign   History of pulmonary embolism   Hypothyroidism   Coronary artery disease involving native coronary artery of native heart without angina pectoris   PAF (paroxysmal atrial fibrillation) (HCC)   DNR (do not resuscitate)/DNI(Do Not Intubate)   Influenza A   Sepsis (HCC)   Cystitis   PLAN   --Will hold off on endoscopic evaluation in absence of active bleeding this admission, normal hgb and multiple co morbidities putting her at increased risk for procedure.  --Recommend Pantoprazole  40 mg daily x 30 days in case she did have erosive disease  / PUD with minor bleeding  Subjective   Resting head on bedside table. Husband trying to get her to eat some fruit. She has no appetite. No nausea. No abdominal pain. No BMs or overt GI bleeding today    Objective    Recent Labs    11/12/23 1030 11/12/23 1640 11/13/23 0559  WBC 4.5  --   --   HGB 12.8 12.3 13.4  HCT 37.2 35.6* 38.0  PLT 160  --   --    BMET Recent Labs    11/12/23 1151  NA 126*  K 4.3  CL 85*  CO2 28  GLUCOSE 88  BUN 20  CREATININE 0.81  CALCIUM  8.4*   LFT Recent Labs    11/12/23 1151  PROT 7.2  ALBUMIN 3.1*  AST 40  ALT 21  ALKPHOS 55  BILITOT 1.0   PT/INR Recent Labs    11/12/23 1151  LABPROT 17.5*  INR 1.4*     Imaging:  DG Chest Port 1 View CLINICAL DATA:  Intermittent melena  EXAM: PORTABLE CHEST 1 VIEW  COMPARISON:  Chest radiograph dated 07/09/2023  FINDINGS: Normal lung volumes. No focal consolidations. No pleural effusion or pneumothorax. Similar enlarged cardiomediastinal silhouette. No acute osseous abnormality. Surgical clips project over the neck.  IMPRESSION: 1. No acute disease. 2. Similar cardiomegaly.  Electronically Signed   By: Limin  Xu M.D.   On: 11/12/2023 10:19     Scheduled inpatient medications:  pantoprazole  (PROTONIX ) IV  40 mg Intravenous Q12H   Continuous inpatient infusions:   lactated ringers  20 mL/hr at 11/12/23 1832   PRN inpatient medications: acetaminophen  **OR** acetaminophen , morphine  injection, ondansetron  **OR** ondansetron  (ZOFRAN ) IV  Vital signs in last 24 hours: Temp:  [97.9 F (36.6 C)-98.6 F (37 C)] 97.9 F (36.6 C) (02/08 0820) Pulse Rate:  [74-94] 89 (02/08 0820) Resp:  [18-28] 18 (02/08 0820) BP: (118-153)/(43-100) 153/100 (02/08 0820) SpO2:  [92 %-98 %] 98 % (02/08 0820) Last BM Date : 11/12/23  Intake/Output Summary (Last 24 hours) at 11/13/2023 1103 Last data filed at 11/12/2023 2343 Gross per 24 hour  Intake 36.89 ml  Output --  Net 36.89 ml     Intake/Output from previous day: 02/07 0701 - 02/08 0700 In: 36.9 [I.V.:36.9] Out: -  Intake/Output this shift: No intake/output data recorded.   Physical Exam:  General: Alert female in NAD Heart:  Regular rate and rhythm.  Pulmonary: Decreased breath sounds bilaterally Abdomen: Soft, nondistended, nontender. Normal bowel sounds. Extremities: No lower extremity edema  Neurologic: Alert and oriented Psych: Pleasant. Cooperative. Insight appears normal.      LOS: 1 day   Vina Dasen ,NP 11/13/2023, 11:03 AM   I have taken history, reviewed the chart and examined the patient. I performed a substantive portion of this encounter, including complete performance of at least one of the key components, in conjunction with the APP. I agree with the Advanced Practitioner's note, impression and recommendations.   Ms. Brutus has been clinically stable from a GI perspective since admission.  No overt bleeding.  Hemoglobin is stable at 13.4.  There is no evidence of overt GI bleeding.  No abdominal discomfort.  I agree with my APP's recommendations to consider a 1 month trial of PPI.  The GI team will sign off for now.  Please reconsult should any new issues arise.

## 2023-11-14 DIAGNOSIS — K921 Melena: Secondary | ICD-10-CM | POA: Diagnosis not present

## 2023-11-14 LAB — URINE CULTURE

## 2023-11-14 MED ORDER — METOPROLOL SUCCINATE ER 100 MG PO TB24: 100.0000 mg | ORAL_TABLET | Freq: Every day | ORAL | 30 refills | Status: DC

## 2023-11-14 MED ORDER — OSELTAMIVIR PHOSPHATE 30 MG PO CAPS: 30.0000 mg | ORAL_CAPSULE | Freq: Two times a day (BID) | ORAL | 0 refills | Status: AC

## 2023-11-14 NOTE — Discharge Summary (Signed)
 [name] FMW:[id number] DOB: May 22, 1936 DOA: 11/12/2023  PCP: Nichole Senior, MD  Admit date: 11/12/2023  Discharge date: 11/14/2023  Admitted From: Home, [facility]   disposition: Rehab, [facility]  H/h stable, eliquis  restarted, h/h stable, had dip in o2 improved with incentive spirometer, continue tamiflu , recheck cbc in 5-7 days at pcp, stop losartan  till then.   Recommendations for Outpatient Follow-up:   Follow up with PCP in 5-7 days for CBC check as well as BP check.  Patient may need to be restarted on losartan  at that time if BP has rebounded sufficiently.   Home Health: N/A Equipment/Devices: N/A Consultations: Gastroenterology Discharge Condition: Fair CODE STATUS: DNR limited Diet Recommendation:   Diet Order             Diet general           Diet Heart Room service appropriate? Yes; Fluid consistency: Thin  Diet effective now                    No chief complaint on file.    Brief history of present illness from the day of admission and additional interim summary    88 y.o. female with past medical history  of A-fib/PE on Eliquis , allergies to hydrocodone, oxycodone and prednisone, hypothyroidism, hyperlipidemia, CAD, carotid stenosis, RA, essential hypertension, history of TIA, Macular degeneration, chronic diastolic congestive heart failure, DNR and DNI coming to the emergency room for melena.  Patient was seen by PCP on the fourth with complaints of dysuria and started on Levaquin  and Keflex .  Patient was also seen by cardiology and of January this year and was scheduled for left and right heart cath for her persistent shortness of breath.  Patient today comes with melena off and on for about 2 weeks and has been on Eliquis  for several years for her A-fib and PE patient also reports of  diarrhea.  Patient also reports a cough no fever nausea vomiting chest pain palpitations falls dizziness headache speech or vision issues.  Workup in the ED was notable for stable blood pressure, hemoglobin 13.4, abnormal UA and positive influenza screen.  Patient's chest x-ray was negative and she was satting well on room air.  Since patient was already on Levaquin , no further antibiotics were started for the abnormal UA.  She was started on Tamiflu  and gastroenterology was consulted.                                                                   Hospital Course    Patient was admitted for workup of black stools that were guaiac positive/melena while on Eliquis .  Patient was seen by gastroenterology who felt that given stable BP, stable H&H, normal BUN and no bowel movements or active bleeding during the hospital stay that  no further workup was warranted at this time.  They did recommend initiation of Protonix  40 mg p.o. daily x 30 days in case there is some erosive gastropathy.  Patient's abnormal urinalysis was sent for culture.  It was growing out diphtheroids and no further testing was done on them as these are usually contaminants.  Of note Levaquin  that patient is on for her bronchitis will also work against diphtheroids and other urinary pathogens.  Patient has chronic shortness of breath x 2 years and has had a cough for which she has been placed on Levaquin  by her PCP.  She notes that her shortness of breath is no worse than usual despite influenza.  Patient was given an incentive spirometer and monitored without any further change in her baseline respiratory function.  She did seem to improve with the incentive spirometer which she should continue to use after discharge.  Patient complained of diminished appetite since COVID.  Notes she has discussed this with her PCP and does not want to take any drugs.  We discussed eating nutritionally dense food which is oftentimes better tolerated as  a liquid with a straw, patient agreed that might be more palatable to her.  Patient had been stopped off of all of her BP medications when she came in for concern for GI bleed.  Patient's amlodipine  and Toprol -XL were restarted however Toprol -XL was started as a daily rather than twice daily dosage.  Losartan  was held.  Patient had good blood pressure on this regimen and patient was discharged home to follow-up with PCP for repeat BP check in 5 to 7 days to restart medications if warranted.  Guaiac positive dark stools on Eliquis  Patient seen by gastroenterology who do not believe there is any active bleeding, H&H have been stable and BUN is normal.  No further workup is required in house per their recommendations. Eliquis  was restarted, H&H remained stable Pantoprazole  40 mg daily x 30 days per GI recommendations Patient to follow-up with PCP and outpatient GI as required   Shortness of breath x 2 years Cough x few weeks treated with outpatient antibiotics Influenza A Patient does not feel that her shortness of breath or cough is any worse than previously Continue Tamiflu  initiated in the ED Continue levofloxacin  for cough/bronchitis per patient report per outpatient regimen   Abnormal UA Urine culture is growing out diphtheroids most likely contaminant Continue levofloxacin  as started in outpatient for cough which will also cover diphtheroids and other urinary pathogens   HTN Patient was taken off of all antihypertensives on admission due to concern for GI bleed.  Patient has had decreased p.o. intake and BP has remained normal on Toprol -XL however amlodipine , losartan  have been held. Continue Toprol -XL daily, decreased from twice daily dosage. Continue to hold amlodipine , losartan  given normotensive off meds at this time.   Patient to be seen by PCP in 5 to 7 days for BP check and modification of antihypertensives as warranted. Encourage p.o. intake  PAF Continue Toprol -XL 100 for  rate control. Continue Eliquis   HFpEF Euvolemic Continue Lasix  per home doses--this may need to be modified by PCP given decreased p.o. intake  Hyponatremia Chronic, stable, sodium at baseline   H/o PE/VTE Continue Eliquis    Discharge diagnosis     Principal Problem:   Melena Active Problems:   PAF (paroxysmal atrial fibrillation) (HCC)   Influenza A   History of pulmonary embolism   Sepsis (HCC)   Coronary artery disease involving native coronary artery of native  heart without angina pectoris   Essential hypertension, benign   Hypothyroidism   DNR (do not resuscitate)/DNI(Do Not Intubate)   Cystitis    Discharge instructions    Discharge Instructions     Diet general   Complete by: As directed    Discharge instructions   Complete by: As directed    1. Take one tablet of tamiflu  tonight and then one tablet twice a day for the next 2 days till all the tablets are completed.  2. Stop taking your Losartan  and amlodipine  and take your Toprol -XL once a day instead of twice a day. Visit your PCP in 5-7 days and let them check your blood pressure and see if they need to modify your blood pressure medicines at that time. 3.  Get your complete blood count checked in 3 to 5 days. 4. Even if you are not hungry, it is important to drink adequate fluids (around 32 ounces) to prevent from getting dehydrated.   Increase activity slowly   Complete by: As directed        Discharge Medications   Allergies as of 11/14/2023       Reactions   Hydrocodone Other (See Comments)   feels weird   Prednisone Nausea And Vomiting   Oral        Medication List     STOP taking these medications    amLODipine  5 MG tablet Commonly known as: NORVASC    cephALEXin  500 MG capsule Commonly known as: KEFLEX    guaiFENesin  600 MG 12 hr tablet Commonly known as: Mucinex    losartan  50 MG tablet Commonly known as: COZAAR        TAKE these medications    atorvastatin  40 MG  tablet Commonly known as: LIPITOR Take 1 tablet (40 mg total) by mouth daily. What changed: when to take this   CALCIUM  + D PO Take 600 mg by mouth 2 (two) times daily.   calcium  carbonate 500 MG chewable tablet Commonly known as: TUMS - dosed in mg elemental calcium  Chew 1 tablet by mouth daily as needed for indigestion or heartburn.   Centrum Silver 50+Women Tabs Take 1 tablet by mouth daily at 12 noon.   Eliquis  2.5 MG Tabs tablet Generic drug: apixaban  TAKE 1 TABLET(2.5 MG) BY MOUTH TWICE DAILY What changed: See the new instructions.   furosemide  20 MG tablet Commonly known as: LASIX  TAKE 1 TABLET(20 MG) BY MOUTH DAILY What changed: See the new instructions.   isosorbide  mononitrate 30 MG 24 hr tablet Commonly known as: IMDUR  Take 30 mg by mouth in the morning.   levofloxacin  500 MG tablet Commonly known as: LEVAQUIN  Take 1 tablet (500 mg total) by mouth daily. What changed: when to take this   levothyroxine  88 MCG tablet Commonly known as: SYNTHROID  Take 88 mcg by mouth daily before breakfast.   metoprolol  succinate 100 MG 24 hr tablet Commonly known as: TOPROL -XL Take 1 tablet (100 mg total) by mouth daily. Take with or immediately following a meal. What changed: when to take this   nitroGLYCERIN  0.4 MG SL tablet Commonly known as: NITROSTAT  For chest pain, tightness, or pressure. While sitting, place 1 tablet under tongue. May be used every 5 minutes as needed, for up to 15 minutes. Do not use more than 3 tablets.   olopatadine 0.1 % ophthalmic solution Commonly known as: PATANOL Place 1 drop into both eyes in the morning.   oseltamivir  30 MG capsule Commonly known as: TAMIFLU  Take 1 capsule (30 mg total)  by mouth 2 (two) times daily for 3 days.   SYSTANE OP Place 1 drop into both eyes daily as needed (dry eyes).   TYLENOL  PO Take 1,000 mg by mouth 2 (two) times daily.          Major procedures and Radiology Reports - PLEASE review detailed  and final reports thoroughly  -       DG Chest Port 1 View Result Date: 11/12/2023 CLINICAL DATA:  Intermittent melena EXAM: PORTABLE CHEST 1 VIEW COMPARISON:  Chest radiograph dated 07/09/2023 FINDINGS: Normal lung volumes. No focal consolidations. No pleural effusion or pneumothorax. Similar enlarged cardiomediastinal silhouette. No acute osseous abnormality. Surgical clips project over the neck. IMPRESSION: 1. No acute disease. 2. Similar cardiomegaly. Electronically Signed   By: Limin  Xu M.D.   On: 11/12/2023 10:19   CARDIAC CATHETERIZATION Result Date: 11/04/2023   Mid RCA lesion is 100% stenosed.   Mid Cx lesion is 50% stenosed.   Mid LAD lesion is 100% stenosed.   Prox LAD to Mid LAD lesion is 50% stenosed.   Ost LAD to Prox LAD lesion is 70% stenosed. 1.  Chronic total occlusion of right coronary artery collateralized via left circumflex. 2.  Chronic total occlusion of mid LAD. 3.  Moderate obstructive disease of proximal LAD; this should be treated medically. 4.  Mean gradient across the aortic valve of 20 mmHg consistent with moderate aortic stenosis 5.  Fick cardiac output of 4.5 L/min and Fick cardiac index of 2.9 L/min/m with the following hemodynamics:  Right atrial pressure mean of 9 mmHg  Right ventricular pressure 45/-1 with an end-diastolic pressure of 7 mmHg  Wedge pressure mean of 16 mmHg with V waves to 20 mmHg  PA pressure 47/19 with a mean of 31 mmHg  PVR of 3.3 Woods units  PA pulsatility index of 3.1 6.  LVEDP of 17 mmHg Recommendation: Medical therapy.    Micro Results    Recent Results (from the past 240 hours)  Urine Culture     Status: None   Collection Time: 11/09/23  3:15 PM  Result Value Ref Range Status   Urine Culture, Routine Final report  Final   Organism ID, Bacteria Comment  Final    Comment: Culture shows less than 10,000 colony forming units of bacteria per milliliter of urine. This colony count is not generally considered to be clinically significant.    Resp panel by RT-PCR (RSV, Flu A&B, Covid) Anterior Nasal Swab     Status: Abnormal   Collection Time: 11/12/23  9:30 AM   Specimen: Anterior Nasal Swab  Result Value Ref Range Status   SARS Coronavirus 2 by RT PCR NEGATIVE NEGATIVE Final   Influenza A by PCR POSITIVE (A) NEGATIVE Final   Influenza B by PCR NEGATIVE NEGATIVE Final    Comment: (NOTE) The Xpert Xpress SARS-CoV-2/FLU/RSV plus assay is intended as an aid in the diagnosis of influenza from Nasopharyngeal swab specimens and should not be used as a sole basis for treatment. Nasal washings and aspirates are unacceptable for Xpert Xpress SARS-CoV-2/FLU/RSV testing.  Fact Sheet for Patients: bloggercourse.com  Fact Sheet for Healthcare Providers: seriousbroker.it  This test is not yet approved or cleared by the [place]  FDA and has been authorized for detection and/or diagnosis of SARS-CoV-2 by FDA under an Emergency Use Authorization (EUA). This EUA will remain in effect (meaning this test can be used) for the duration of the COVID-19 declaration under Section 564(b)(1) of the Act, 21 U.S.C. section  360bbb-3(b)(1), unless the authorization is terminated or revoked.     Resp Syncytial Virus by PCR NEGATIVE NEGATIVE Final    Comment: (NOTE) Fact Sheet for Patients: bloggercourse.com  Fact Sheet for Healthcare Providers: seriousbroker.it  This test is not yet approved or cleared by the [place]  FDA and has been authorized for detection and/or diagnosis of SARS-CoV-2 by FDA under an Emergency Use Authorization (EUA). This EUA will remain in effect (meaning this test can be used) for the duration of the COVID-19 declaration under Section 564(b)(1) of the Act, 21 U.S.C. section 360bbb-3(b)(1), unless the authorization is terminated or revoked.  Performed at [facility], [address] [place],  [place] [address]   Urine Culture     Status: Abnormal   Collection Time: 11/12/23 11:16 AM   Specimen: Urine, Clean Catch  Result Value Ref Range Status   Specimen Description URINE, CLEAN CATCH  Final   Special Requests NONE Reflexed from [id number]  Final   Culture (A)  Final    >=100,000 COLONIES/mL DIPHTHEROIDS(CORYNEBACTERIUM SPECIES) Standardized susceptibility testing for this organism is not available. Performed at [facility], [address] [place], [place] [address]    Report Status 11/14/2023 FINAL  Final    Today   Subjective    [name] feels much improved since admission.  Feels ready to go home.  Denies chest pain, shortness of breath or abdominal pain.  Feels they can take care of themselves with the resources they have at home.  Objective   Blood pressure 118/70, pulse 91, temperature 97.6 F (36.4 C), temperature source Oral, resp. rate 17, height 5' 3 (1.6 m), weight 51.3 kg, SpO2 97%.   Intake/Output Summary (Last 24 hours) at 11/14/2023 1433 Last data filed at 11/13/2023 1800 Gross per 24 hour  Intake --  Output 650 ml  Net -650 ml    Exam General: Patient appears tired, lying in bed with attentive husband at bedside Eyes: sclera anicteric, conjuctiva mild injection bilaterally CVS: S1-S2, regular  Respiratory:  decreased air entry bilaterally secondary to decreased inspiratory effort, rales at bases  GI: NABS, soft, NT  LE: No edema.  Neuro: A/O x 3, Moving all extremities equally with normal strength, CN 3-12 intact, grossly nonfocal.      Data Review   CBC w Diff:  Lab Results  Component Value Date   WBC 4.5 11/12/2023   HGB 13.4 11/13/2023   HGB 12.4 10/29/2023   HCT 38.0 11/13/2023   HCT 36.8 10/29/2023   PLT 160 11/12/2023   PLT 219 10/29/2023   LYMPHOPCT 10 11/12/2023   MONOPCT 13 11/12/2023   EOSPCT 0 11/12/2023   BASOPCT 0 11/12/2023    CMP:  Lab Results  Component Value Date   NA 126 (L) 11/12/2023   NA 134  10/29/2023   K 4.3 11/12/2023   CL 85 (L) 11/12/2023   CO2 28 11/12/2023   BUN 20 11/12/2023   BUN 21 10/29/2023   CREATININE 0.81 11/12/2023   PROT 7.2 11/12/2023   PROT 8.3 07/10/2021   ALBUMIN 3.1 (L) 11/12/2023   ALBUMIN 4.4 07/10/2021   BILITOT 1.0 11/12/2023   BILITOT 0.7 07/10/2021   ALKPHOS 55 11/12/2023   AST 40 11/12/2023   ALT 21 11/12/2023  .   Total Time in preparing paper work, data evaluation and todays exam - 35 minutes  Antoniette Peake Tublu Lannie Brady M.D on 11/14/2023 at 2:33 PM  [facility]

## 2023-11-14 NOTE — NC FL2 (Signed)
 [place]  MEDICAID FL2 LEVEL OF CARE FORM     IDENTIFICATION  Patient Name: [name] Birthdate: Mar 18, 1936 Sex: female Admission Date (Current Location): 11/12/2023  [facility] and [place] Number:  Producer, Television/film/video and Address:  [facility], [address] [place], [place] [address]      Provider Number: [id number]  Attending Physician Name and Address:  Dana Ivan Masse*  Relative Name and Phone Number:       Current Level of Care: Hospital Recommended Level of Care: Skilled Nursing Facility Prior Approval Number:    Date Approved/Denied:   PASRR Number: [id number]  Discharge Plan: SNF    Current Diagnoses: Patient Active Problem List   Diagnosis Date Noted   Melena 11/12/2023   Influenza A 11/12/2023   Sepsis (HCC) 11/12/2023   Cystitis 11/12/2023   Urinary tract infection without hematuria 11/09/2023   Acute bronchitis due to other specified organisms 11/09/2023   Excessive ear wax, left 03/23/2023   Nonrheumatic aortic valve stenosis 03/20/2023   Acute respiratory failure with hypoxia (HCC) 09/25/2022   Hypervolemia 09/25/2022   DNR (do not resuscitate)/DNI(Do Not Intubate) 09/25/2022   Acute on chronic diastolic CHF (congestive heart failure) (HCC) 09/25/2022   PAF (paroxysmal atrial fibrillation) (HCC) 08/05/2021   Stable branch retinal vein occlusion of left eye 08/19/2020   Exudative age-related macular degeneration of right eye with inactive choroidal neovascularization (HCC) 08/19/2020   Dry eyes 04/16/2020   Intermediate stage nonexudative age-related macular degeneration of both eyes 04/16/2020   Dizziness 11/30/2019   Dyslipidemia 05/18/2019   Coronary artery disease involving native coronary artery of native heart without angina pectoris 01/01/2019   Tremor 11/01/2018   Shortness of breath 09/25/2018   Hypothyroidism 10/30/2013   TIA (transient ischemic attack)- remote 10/30/2013   Dyspnea 10/28/2013    PSVT (paroxysmal supraventricular tachycardia) (HCC) 10/28/2013   History of pulmonary embolism 10/28/2013   Osteoporosis    Essential hypertension, benign 07/11/2009   HYPERLIPIDEMIA 01/10/2009   CAROTID STENOSIS- moderate 01/10/2009   Macular degeneration (senile) of retina 01/09/2009   CAD- severe LAD and RCA disease with collaterals from CFX Sept 2009. Medical Rx 01/09/2009   Diverticulosis of colon 01/09/2009   Rheumatoid arthritis (HCC) 01/09/2009   Osteoporosis 01/09/2009    Orientation RESPIRATION BLADDER Height & Weight     Self, Time, Situation, Place  O2 Continent Weight: 113 lb (51.3 kg) Height:  5' 3 (160 cm)  BEHAVIORAL SYMPTOMS/MOOD NEUROLOGICAL BOWEL NUTRITION STATUS      Continent    AMBULATORY STATUS COMMUNICATION OF NEEDS Skin   Extensive Assist Verbally Normal                       Personal Care Assistance Level of Assistance  Bathing, Feeding, Dressing Bathing Assistance: Limited assistance Feeding assistance: Limited assistance Dressing Assistance: Maximum assistance     Functional Limitations Info  Sight, Hearing, Speech Sight Info: Adequate Hearing Info: Adequate Speech Info: Adequate    SPECIAL CARE FACTORS FREQUENCY                       Contractures Contractures Info: Not present    Additional Factors Info                  Current Medications (11/14/2023):  This is the current hospital active medication list Current Facility-Administered Medications  Medication Dose Route Frequency Provider Last Rate Last Admin   acetaminophen  (TYLENOL ) tablet 650  mg  650 mg Oral Q6H PRN Patel, Ekta V, MD       Or   acetaminophen  (TYLENOL ) suppository 650 mg  650 mg Rectal Q6H PRN Patel, Ekta V, MD       apixaban  (ELIQUIS ) tablet 2.5 mg  2.5 mg Oral BID Chatterjee, Srobona Tublu, MD   2.5 mg at 11/14/23 9043   atorvastatin  (LIPITOR) tablet 40 mg  40 mg Oral QHS Chatterjee, Srobona Tublu, MD   40 mg at 11/13/23 2133   calcium  carbonate  (TUMS - dosed in mg elemental calcium ) chewable tablet 200 mg of elemental calcium   1 tablet Oral BID PRN Opyd, Timothy S, MD   200 mg of elemental calcium  at 11/13/23 2132   furosemide  (LASIX ) tablet 20 mg  20 mg Oral BID Chatterjee, Srobona Tublu, MD   20 mg at 11/14/23 9165   isosorbide  mononitrate (IMDUR ) 24 hr tablet 30 mg  30 mg Oral q AM Chatterjee, Srobona Tublu, MD   30 mg at 11/14/23 0834   levofloxacin  (LEVAQUIN ) tablet 500 mg  500 mg Oral Q24H Chatterjee, Srobona Tublu, MD   500 mg at 11/13/23 1548   levothyroxine  (SYNTHROID ) tablet 88 mcg  88 mcg Oral Q0600 Chatterjee, Srobona Tublu, MD   88 mcg at 11/14/23 9387   metoprolol  succinate (TOPROL -XL) 24 hr tablet 100 mg  100 mg Oral Daily Chatterjee, Srobona Tublu, MD   100 mg at 11/14/23 9043   morphine  (PF) 2 MG/ML injection 2 mg  2 mg Intravenous Q2H PRN Patel, Ekta V, MD       ondansetron  (ZOFRAN ) tablet 4 mg  4 mg Oral Q6H PRN Patel, Ekta V, MD       Or   ondansetron  (ZOFRAN ) injection 4 mg  4 mg Intravenous Q6H PRN Patel, Ekta V, MD       oseltamivir  (TAMIFLU ) capsule 30 mg  30 mg Oral BID Chatterjee, Srobona Tublu, MD   30 mg at 11/14/23 0956   pantoprazole  (PROTONIX ) EC tablet 40 mg  40 mg Oral Daily Chatterjee, Srobona Tublu, MD   40 mg at 11/14/23 9043     Discharge Medications: Please see discharge summary for a list of discharge medications.  Relevant Imaging Results:  Relevant Lab Results:   Additional Information SS# [id number]  [name], [place]

## 2023-11-14 NOTE — Plan of Care (Signed)

## 2023-11-14 NOTE — TOC Transition Note (Signed)
 Transition of Care [facility]) - Discharge Note   Patient Details  Name: [name] MRN: [id number] Date of Birth: October 16, 1935  [facility] CM/SW Contact:  Gwenn Julien Norris, LCSW Phone Number: 11/14/2023, 3:00 PM   Clinical Narrative:  Pt admitted from [facility] where she lives with her spouse. Per spouse, plan is for SNF. Spoke to Brittany at [facility] who confirmed they are prepared to admit pt to SNF today, room 504. Pt and pt's spouse agreeable to dc plan. RN provided with number for report and [facility] arranged for transport. SW signing off at dc.   Julien Gwenn, MSW, LCSW [phone] (coverage)       Final next level of care: Skilled Nursing Facility Barriers to Discharge: Barriers Resolved   Patient Goals and CMS Choice   CMS Medicare.gov Compare Post Acute Care list provided to:: Patient Choice offered to / list presented to : Patient [place] ownership interest in [facility].provided to:: Patient    Discharge Placement              Patient chooses bed at: [facility] Patient to be transferred to facility by: [facility] Name of family member notified: Gene/spouse Patient and family notified of of transfer: 11/14/23  Discharge Plan and Services Additional resources added to the After Visit Summary for                                       Social Drivers of Health (SDOH) Interventions SDOH Screenings   Food Insecurity: No Food Insecurity (11/12/2023)  Housing: Low Risk  (11/12/2023)  Transportation Needs: No Transportation Needs (11/12/2023)  Utilities: Not At Risk (11/12/2023)  Social Connections: Socially Integrated (11/12/2023)  Tobacco Use: Low Risk  (11/12/2023)     Readmission Risk Interventions     No data to display

## 2023-11-15 DIAGNOSIS — I2510 Atherosclerotic heart disease of native coronary artery without angina pectoris: Secondary | ICD-10-CM | POA: Diagnosis not present

## 2023-11-15 DIAGNOSIS — E785 Hyperlipidemia, unspecified: Secondary | ICD-10-CM | POA: Diagnosis not present

## 2023-11-15 DIAGNOSIS — A419 Sepsis, unspecified organism: Secondary | ICD-10-CM | POA: Diagnosis not present

## 2023-11-15 DIAGNOSIS — E569 Vitamin deficiency, unspecified: Secondary | ICD-10-CM | POA: Diagnosis not present

## 2023-11-18 ENCOUNTER — Encounter (HOSPITAL_COMMUNITY): Payer: Self-pay | Admitting: Emergency Medicine

## 2023-11-18 ENCOUNTER — Emergency Department (HOSPITAL_COMMUNITY): Payer: Medicare Other

## 2023-11-18 ENCOUNTER — Inpatient Hospital Stay (HOSPITAL_COMMUNITY)
Admission: EM | Admit: 2023-11-18 | Discharge: 2023-11-24 | DRG: 644 | Disposition: A | Discharge status: Hospice | Payer: Medicare Other | Source: Ambulatory Visit | Attending: Internal Medicine | Admitting: Internal Medicine

## 2023-11-18 ENCOUNTER — Other Ambulatory Visit: Payer: Self-pay

## 2023-11-18 DIAGNOSIS — Z801 Family history of malignant neoplasm of trachea, bronchus and lung: Secondary | ICD-10-CM

## 2023-11-18 DIAGNOSIS — I110 Hypertensive heart disease with heart failure: Secondary | ICD-10-CM | POA: Diagnosis present

## 2023-11-18 DIAGNOSIS — I2510 Atherosclerotic heart disease of native coronary artery without angina pectoris: Secondary | ICD-10-CM

## 2023-11-18 DIAGNOSIS — Z86711 Personal history of pulmonary embolism: Secondary | ICD-10-CM | POA: Diagnosis not present

## 2023-11-18 DIAGNOSIS — Z833 Family history of diabetes mellitus: Secondary | ICD-10-CM

## 2023-11-18 DIAGNOSIS — E222 Syndrome of inappropriate secretion of antidiuretic hormone: Principal | ICD-10-CM | POA: Diagnosis present

## 2023-11-18 DIAGNOSIS — I5032 Chronic diastolic (congestive) heart failure: Secondary | ICD-10-CM | POA: Diagnosis not present

## 2023-11-18 DIAGNOSIS — Z885 Allergy status to narcotic agent status: Secondary | ICD-10-CM

## 2023-11-18 DIAGNOSIS — Z8619 Personal history of other infectious and parasitic diseases: Secondary | ICD-10-CM

## 2023-11-18 DIAGNOSIS — Z8616 Personal history of COVID-19: Secondary | ICD-10-CM

## 2023-11-18 DIAGNOSIS — E039 Hypothyroidism, unspecified: Secondary | ICD-10-CM | POA: Diagnosis not present

## 2023-11-18 DIAGNOSIS — E785 Hyperlipidemia, unspecified: Secondary | ICD-10-CM | POA: Diagnosis present

## 2023-11-18 DIAGNOSIS — R627 Adult failure to thrive: Secondary | ICD-10-CM | POA: Diagnosis present

## 2023-11-18 DIAGNOSIS — Z8249 Family history of ischemic heart disease and other diseases of the circulatory system: Secondary | ICD-10-CM | POA: Diagnosis not present

## 2023-11-18 DIAGNOSIS — Z79890 Hormone replacement therapy (postmenopausal): Secondary | ICD-10-CM

## 2023-11-18 DIAGNOSIS — M810 Age-related osteoporosis without current pathological fracture: Secondary | ICD-10-CM | POA: Diagnosis present

## 2023-11-18 DIAGNOSIS — G9340 Encephalopathy, unspecified: Secondary | ICD-10-CM

## 2023-11-18 DIAGNOSIS — R4182 Altered mental status, unspecified: Secondary | ICD-10-CM | POA: Diagnosis not present

## 2023-11-18 DIAGNOSIS — Z7901 Long term (current) use of anticoagulants: Secondary | ICD-10-CM | POA: Diagnosis not present

## 2023-11-18 DIAGNOSIS — Z681 Body mass index (BMI) 19 or less, adult: Secondary | ICD-10-CM | POA: Diagnosis not present

## 2023-11-18 DIAGNOSIS — G9349 Other encephalopathy: Secondary | ICD-10-CM | POA: Diagnosis present

## 2023-11-18 DIAGNOSIS — Z66 Do not resuscitate: Secondary | ICD-10-CM | POA: Diagnosis not present

## 2023-11-18 DIAGNOSIS — Z823 Family history of stroke: Secondary | ICD-10-CM

## 2023-11-18 DIAGNOSIS — E871 Hypo-osmolality and hyponatremia: Principal | ICD-10-CM

## 2023-11-18 DIAGNOSIS — I350 Nonrheumatic aortic (valve) stenosis: Secondary | ICD-10-CM | POA: Diagnosis present

## 2023-11-18 DIAGNOSIS — I10 Essential (primary) hypertension: Secondary | ICD-10-CM | POA: Diagnosis not present

## 2023-11-18 DIAGNOSIS — F32A Depression, unspecified: Secondary | ICD-10-CM | POA: Diagnosis not present

## 2023-11-18 DIAGNOSIS — Z85850 Personal history of malignant neoplasm of thyroid: Secondary | ICD-10-CM | POA: Diagnosis not present

## 2023-11-18 DIAGNOSIS — T501X5A Adverse effect of loop [high-ceiling] diuretics, initial encounter: Secondary | ICD-10-CM | POA: Diagnosis present

## 2023-11-18 DIAGNOSIS — Z515 Encounter for palliative care: Secondary | ICD-10-CM | POA: Diagnosis not present

## 2023-11-18 DIAGNOSIS — Z888 Allergy status to other drugs, medicaments and biological substances status: Secondary | ICD-10-CM

## 2023-11-18 DIAGNOSIS — M069 Rheumatoid arthritis, unspecified: Secondary | ICD-10-CM | POA: Diagnosis not present

## 2023-11-18 DIAGNOSIS — J9811 Atelectasis: Secondary | ICD-10-CM | POA: Diagnosis not present

## 2023-11-18 DIAGNOSIS — Z7189 Other specified counseling: Secondary | ICD-10-CM | POA: Diagnosis not present

## 2023-11-18 DIAGNOSIS — R059 Cough, unspecified: Secondary | ICD-10-CM | POA: Diagnosis not present

## 2023-11-18 DIAGNOSIS — Z79899 Other long term (current) drug therapy: Secondary | ICD-10-CM

## 2023-11-18 DIAGNOSIS — E860 Dehydration: Secondary | ICD-10-CM | POA: Diagnosis present

## 2023-11-18 DIAGNOSIS — I480 Paroxysmal atrial fibrillation: Secondary | ICD-10-CM

## 2023-11-18 DIAGNOSIS — J90 Pleural effusion, not elsewhere classified: Secondary | ICD-10-CM | POA: Diagnosis not present

## 2023-11-18 DIAGNOSIS — Z8041 Family history of malignant neoplasm of ovary: Secondary | ICD-10-CM

## 2023-11-18 DIAGNOSIS — R630 Anorexia: Secondary | ICD-10-CM | POA: Diagnosis present

## 2023-11-18 DIAGNOSIS — J101 Influenza due to other identified influenza virus with other respiratory manifestations: Secondary | ICD-10-CM | POA: Diagnosis not present

## 2023-11-18 DIAGNOSIS — Z832 Family history of diseases of the blood and blood-forming organs and certain disorders involving the immune mechanism: Secondary | ICD-10-CM

## 2023-11-18 DIAGNOSIS — R5383 Other fatigue: Secondary | ICD-10-CM | POA: Diagnosis not present

## 2023-11-18 DIAGNOSIS — Z90710 Acquired absence of both cervix and uterus: Secondary | ICD-10-CM

## 2023-11-18 LAB — URINALYSIS, ROUTINE W REFLEX MICROSCOPIC
Bacteria, UA: NONE SEEN
Bilirubin Urine: NEGATIVE
Glucose, UA: NEGATIVE mg/dL
Hgb urine dipstick: NEGATIVE
Ketones, ur: NEGATIVE mg/dL
Nitrite: NEGATIVE
Protein, ur: 30 mg/dL — AB
Specific Gravity, Urine: 1.014 (ref 1.005–1.030)
pH: 7 (ref 5.0–8.0)

## 2023-11-18 LAB — COMPREHENSIVE METABOLIC PANEL
ALT: 20 U/L (ref 0–44)
AST: 42 U/L — ABNORMAL HIGH (ref 15–41)
Albumin: 3.7 g/dL (ref 3.5–5.0)
Alkaline Phosphatase: 69 U/L (ref 38–126)
Anion gap: 13 (ref 5–15)
BUN: 24 mg/dL — ABNORMAL HIGH (ref 8–23)
CO2: 30 mmol/L (ref 22–32)
Calcium: 8.4 mg/dL — ABNORMAL LOW (ref 8.9–10.3)
Chloride: 82 mmol/L — ABNORMAL LOW (ref 98–111)
Creatinine, Ser: 0.35 mg/dL — ABNORMAL LOW (ref 0.44–1.00)
GFR, Estimated: >60 mL/min (ref >60)
Glucose, Bld: 113 mg/dL — ABNORMAL HIGH (ref 70–99)
Potassium: 4.7 mmol/L (ref 3.5–5.1)
Sodium: 125 mmol/L — ABNORMAL LOW (ref 135–145)
Total Bilirubin: 1.5 mg/dL — ABNORMAL HIGH (ref 0.0–1.2)
Total Protein: 8.6 g/dL — ABNORMAL HIGH (ref 6.5–8.1)

## 2023-11-18 LAB — CBC
HCT: 43.7 % (ref 36.0–46.0)
Hemoglobin: 14.2 g/dL (ref 12.0–15.0)
MCH: 31.3 pg (ref 26.0–34.0)
MCHC: 32.5 g/dL (ref 30.0–36.0)
MCV: 96.3 fL (ref 80.0–100.0)
Platelets: 226 10*3/uL (ref 150–400)
RBC: 4.54 MIL/uL (ref 3.87–5.11)
RDW: 13 % (ref 11.5–15.5)
WBC: 7.2 10*3/uL (ref 4.0–10.5)
nRBC: 0 % (ref 0.0–0.2)

## 2023-11-18 LAB — AMMONIA: Ammonia: <10 umol/L (ref 9–35)

## 2023-11-18 LAB — OSMOLALITY: Osmolality: 276 mosm/kg (ref 275–295)

## 2023-11-18 LAB — OSMOLALITY, URINE: Osmolality, Ur: 495 mosm/kg (ref 300–900)

## 2023-11-18 LAB — VITAMIN B12: Vitamin B-12: 1081 pg/mL — ABNORMAL HIGH (ref 180–914)

## 2023-11-18 LAB — SODIUM, URINE, RANDOM: Sodium, Ur: 80 mmol/L

## 2023-11-18 LAB — LACTIC ACID, PLASMA: Lactic Acid, Venous: 1.5 mmol/L (ref 0.5–1.9)

## 2023-11-18 LAB — CBG MONITORING, ED: Glucose-Capillary: 106 mg/dL — ABNORMAL HIGH (ref 70–99)

## 2023-11-18 MED ORDER — SODIUM CHLORIDE 0.9 % IV BOLUS
1000.0000 mL | Freq: Once | INTRAVENOUS | Status: AC
  Administered 2023-11-18: 1000 mL via INTRAVENOUS

## 2023-11-18 MED ORDER — LEVOTHYROXINE SODIUM 88 MCG PO TABS
88.0000 ug | ORAL_TABLET | Freq: Every day | ORAL | Status: DC
  Administered 2023-11-19 – 2023-11-22 (×4): 88 ug via ORAL
  Filled 2023-11-18 (×5): qty 1

## 2023-11-18 MED ORDER — SODIUM CHLORIDE 0.9% FLUSH
3.0000 mL | Freq: Two times a day (BID) | INTRAVENOUS | Status: DC
  Administered 2023-11-19 – 2023-11-24 (×10): 3 mL via INTRAVENOUS

## 2023-11-18 MED ORDER — ISOSORBIDE MONONITRATE ER 30 MG PO TB24
30.0000 mg | ORAL_TABLET | Freq: Every morning | ORAL | Status: DC
  Administered 2023-11-19 – 2023-11-23 (×5): 30 mg via ORAL
  Filled 2023-11-18 (×5): qty 1

## 2023-11-18 MED ORDER — APIXABAN 2.5 MG PO TABS
2.5000 mg | ORAL_TABLET | Freq: Two times a day (BID) | ORAL | Status: DC
  Administered 2023-11-18 – 2023-11-23 (×10): 2.5 mg via ORAL
  Filled 2023-11-18 (×10): qty 1

## 2023-11-18 MED ORDER — METOPROLOL SUCCINATE ER 50 MG PO TB24
100.0000 mg | ORAL_TABLET | Freq: Every day | ORAL | Status: DC
  Administered 2023-11-19 – 2023-11-23 (×5): 100 mg via ORAL
  Filled 2023-11-18 (×5): qty 2

## 2023-11-18 MED ORDER — SENNOSIDES-DOCUSATE SODIUM 8.6-50 MG PO TABS: 1.0000 | ORAL_TABLET | Freq: Every evening | ORAL | Status: DC | PRN

## 2023-11-18 MED ORDER — ATORVASTATIN CALCIUM 40 MG PO TABS
40.0000 mg | ORAL_TABLET | Freq: Every day | ORAL | Status: DC
  Administered 2023-11-18 – 2023-11-22 (×5): 40 mg via ORAL
  Filled 2023-11-18 (×5): qty 1

## 2023-11-18 MED ORDER — ACETAMINOPHEN 650 MG RE SUPP: 650.0000 mg | Freq: Four times a day (QID) | RECTAL | Status: DC | PRN

## 2023-11-18 MED ORDER — SODIUM CHLORIDE 0.9 % IV SOLN: INTRAVENOUS | Status: AC

## 2023-11-18 MED ORDER — PROCHLORPERAZINE EDISYLATE 10 MG/2ML IJ SOLN: 5.0000 mg | Freq: Four times a day (QID) | INTRAMUSCULAR | Status: DC | PRN

## 2023-11-18 MED ORDER — ACETAMINOPHEN 325 MG PO TABS: 650.0000 mg | ORAL_TABLET | Freq: Four times a day (QID) | ORAL | Status: DC | PRN

## 2023-11-18 NOTE — ED Provider Triage Note (Signed)
Emergency Medicine Provider Triage Evaluation Note  ALEJA YEARWOOD , a 88 y.o. female  was evaluated in triage.  Pt complains of abnormal labs. Pt was hospitalized a week ago for flu A.  Since discharge 4 days ago she has had no appetite, having increased confusion, fatigue and nausea.  Labs obtain today showing Na+ 124.  Husband notice occasional tremors that look like seizures  Review of Systems  Positive: As above Negative: As above  Physical Exam  BP 102/68   Pulse 89   Temp 97.7 F (36.5 C)   Resp 20   SpO2 94%  Gen:   Awake, no distress   Resp:  Normal effort  MSK:   Moves extremities without difficulty  Other:    Medical Decision Making  Medically screening exam initiated at 1:28 PM.  Appropriate orders placed.  [name] was informed that the remainder of the evaluation will be completed by another provider, this initial triage assessment does not replace that evaluation, and the importance of remaining in the ED until their evaluation is complete.     Fayrene Helper, PA-C 11/18/23 1329

## 2023-11-18 NOTE — ED Notes (Signed)
 ED TO INPATIENT HANDOFF REPORT  ED Nurse Name and Phone #: Wynema Birch Name/Age/Gender [name] 88 y.o. female Room/Bed: WA17/WA17  Code Status   Code Status: Limited: Do not attempt resuscitation (DNR) -DNR-LIMITED -Do Not Intubate/DNI   Home/SNF/Other Home Patient oriented to: self, place, time, and situation Is this baseline? Yes   Triage Complete: Triage complete  Chief Complaint Acute encephalopathy [G93.40]  Triage Note Patient presents from her MD office today due to concern for critically low sodium levels. MD told her to come here. Sodium level was 124. She has experienced worsening confusion, tremors, nausea, low appetite, wheezing, congestion and cough.     Allergies Allergies  Allergen Reactions   Hydrocodone Other (See Comments)    "feels weird"   Prednisone Nausea And Vomiting and Other (See Comments)    Oral version = N/V    Level of Care/Admitting Diagnosis ED Disposition     ED Disposition  Admit   Condition  --   Comment  Hospital Area: [facility] [[id number]]  Level of Care: Telemetry [5]  Admit to tele based on following criteria: Monitor QTC interval  May place patient in observation at [facility] or [facility] if equivalent level of care is available:: Yes  Covid Evaluation: Asymptomatic - no recent exposure (last 10 days) testing not required  Diagnosis: Acute encephalopathy [098119]  Admitting Physician: Briscoe Deutscher [[id number]]  Attending Physician: Briscoe Deutscher [[id number]]          B Medical/Surgery History Past Medical History:  Diagnosis Date   Aortic stenosis    Atrial fibrillation (HCC)    CAD, UNSPECIFIED SITE    2009. 99% proximal RCA stenosis followed by 80% distal stenosis. The LAD has a 100% stenosis in the midsegment. The circumflex is patent with distal occlusion. She has been managed medically. Her EF was well-preserved.   CAROTID STENOSIS    Chronic diastolic (congestive) heart failure  (HCC)    DIVERTICULAR DISEASE    Elevated troponin- 0.58 in setting of PSVT 10/29/2013   Essential hypertension, benign    Gastritis    H/O blood clots 1984   DVT and PE   Hx of degenerative disc disease    HYPERLIPIDEMIA    MACULAR DEGENERATION    OSTEOPOROSIS    Rheumatoid arthritis(714.0)    Thyroid cancer (HCC) 2008   Past Surgical History:  Procedure Laterality Date   ABDOMINAL HYSTERECTOMY  1984   APPENDECTOMY  1959   BREAST CYST EXCISION     left   CARDIOVERSION N/A 08/13/2021   Procedure: CARDIOVERSION;  Surgeon: Chilton Si, MD;  Location: [facility] ENDOSCOPY;  Service: Cardiovascular;  Laterality: N/A;   CATARACT EXTRACTION, BILATERAL     CHOLECYSTECTOMY  2009   COLONOSCOPY     ESOPHAGOGASTRODUODENOSCOPY     IM NAILING FEMORAL SHAFT RETROGRADE  2010   left   RIGHT/LEFT HEART CATH AND CORONARY ANGIOGRAPHY N/A 11/04/2023   Procedure: RIGHT/LEFT HEART CATH AND CORONARY ANGIOGRAPHY;  Surgeon: [name], MD;  Location: [facility] INVASIVE CV LAB;  Service: Cardiovascular;  Laterality: N/A;   TONSILLECTOMY     WRIST SURGERY  2004   Left     A IV Location/Drains/Wounds Patient Lines/Drains/Airways Status     Active Line/Drains/Airways     Name Placement date Placement time Site Days   Peripheral IV 11/18/23 20 G Anterior;Left Forearm 11/18/23  1431  Forearm  less than 1   Peripheral IV 11/18/23 20 G Anterior;Proximal;Right Forearm 11/18/23  1431  Forearm  less than 1            Intake/Output Last 24 hours  Intake/Output Summary (Last 24 hours) at 11/18/2023 1724 Last data filed at 11/18/2023 1645 Gross per 24 hour  Intake 1000 ml  Output --  Net 1000 ml    Labs/Imaging Results for orders placed or performed during the hospital encounter of 11/18/23 (from the past 48 hours)  CBG monitoring, ED     Status: Abnormal   Collection Time: 11/18/23  1:34 PM  Result Value Ref Range   Glucose-Capillary 106 (H) 70 - 99 mg/dL    Comment: Glucose reference range  applies only to samples taken after fasting for at least 8 hours.  Comprehensive metabolic panel     Status: Abnormal   Collection Time: 11/18/23  2:14 PM  Result Value Ref Range   Sodium 125 (L) 135 - 145 mmol/L   Potassium 4.7 3.5 - 5.1 mmol/L    Comment: HEMOLYSIS AT THIS LEVEL MAY AFFECT RESULT   Chloride 82 (L) 98 - 111 mmol/L   CO2 30 22 - 32 mmol/L   Glucose, Bld 113 (H) 70 - 99 mg/dL    Comment: Glucose reference range applies only to samples taken after fasting for at least 8 hours.   BUN 24 (H) 8 - 23 mg/dL   Creatinine, Ser 1.61 (L) 0.44 - 1.00 mg/dL   Calcium 8.4 (L) 8.9 - 10.3 mg/dL   Total Protein 8.6 (H) 6.5 - 8.1 g/dL   Albumin 3.7 3.5 - 5.0 g/dL   AST 42 (H) 15 - 41 U/L    Comment: HEMOLYSIS AT THIS LEVEL MAY AFFECT RESULT   ALT 20 0 - 44 U/L    Comment: HEMOLYSIS AT THIS LEVEL MAY AFFECT RESULT   Alkaline Phosphatase 69 38 - 126 U/L   Total Bilirubin 1.5 (H) 0.0 - 1.2 mg/dL    Comment: HEMOLYSIS AT THIS LEVEL MAY AFFECT RESULT   GFR, Estimated >60 >60 mL/min    Comment: (NOTE) Calculated using the CKD-EPI Creatinine Equation (2021)    Anion gap 13 5 - 15    Comment: Performed at [facility], [address], [place], [place] [address]  CBC     Status: None   Collection Time: 11/18/23  2:14 PM  Result Value Ref Range   WBC 7.2 4.0 - 10.5 K/uL   RBC 4.54 3.87 - 5.11 MIL/uL   Hemoglobin 14.2 12.0 - 15.0 g/dL   HCT 54.0 98.1 - 19.1 %   MCV 96.3 80.0 - 100.0 fL   MCH 31.3 26.0 - 34.0 pg   MCHC 32.5 30.0 - 36.0 g/dL   RDW 47.8 29.5 - 62.1 %   Platelets 226 150 - 400 K/uL   nRBC 0.0 0.0 - 0.2 %    Comment: Performed at [facility], [address] [place], [place] [address]  Lactic acid, plasma     Status: None   Collection Time: 11/18/23  2:36 PM  Result Value Ref Range   Lactic Acid, Venous 1.5 0.5 - 1.9 mmol/L    Comment: Performed at [facility], [address] [place], [place] [address]  Blood  culture (routine x 2)     Status: None (Preliminary result)   Collection Time: 11/18/23  2:46 PM   Specimen: BLOOD RIGHT ARM  Result Value Ref Range   Specimen Description      BLOOD RIGHT ARM Performed at [facility]  Hospital Lab, [address] [place], [place] [address]    Special Requests      BOTTLES DRAWN AEROBIC AND ANAEROBIC Blood Culture results may not be optimal due to an inadequate volume of blood received in culture bottles Performed at [facility], [address], [place], [place] [address]    Culture PENDING    Report Status PENDING   Blood culture (routine x 2)     Status: None (Preliminary result)   Collection Time: 11/18/23  2:47 PM   Specimen: BLOOD LEFT ARM  Result Value Ref Range   Specimen Description      BLOOD LEFT ARM Performed at [facility], [address] [place], [place] [address]    Special Requests      BOTTLES DRAWN AEROBIC AND ANAEROBIC Blood Culture results may not be optimal due to an inadequate volume of blood received in culture bottles Performed at [facility], [address] [place], [place] [address]    Culture PENDING    Report Status PENDING   Urinalysis, Routine w reflex microscopic -Urine, Clean Catch     Status: Abnormal   Collection Time: 11/18/23  4:28 PM  Result Value Ref Range   Color, Urine YELLOW YELLOW   APPearance CLEAR CLEAR   Specific Gravity, Urine 1.014 1.005 - 1.030   pH 7.0 5.0 - 8.0   Glucose, UA NEGATIVE NEGATIVE mg/dL   Hgb urine dipstick NEGATIVE NEGATIVE   Bilirubin Urine NEGATIVE NEGATIVE   Ketones, ur NEGATIVE NEGATIVE mg/dL   Protein, ur 30 (A) NEGATIVE mg/dL   Nitrite NEGATIVE NEGATIVE   Leukocytes,Ua TRACE (A) NEGATIVE   RBC / HPF 0-5 0 - 5 RBC/hpf   WBC, UA 6-10 0 - 5 WBC/hpf   Bacteria, UA NONE SEEN NONE SEEN   Squamous Epithelial / HPF 0-5 0 - 5 /HPF   Hyaline Casts, UA PRESENT     Comment: Performed at [facility], [address] [place], [place] [address]  Sodium, urine, random     Status: None   Collection Time: 11/18/23  4:28 PM  Result Value Ref Range   Sodium, Ur 80 mmol/L    Comment: Performed at [facility], [address], [place], [place] [address]   DG Chest Port 1 View Result Date: 11/18/2023 CLINICAL DATA:  Cough and fatigue. EXAM: PORTABLE CHEST 1 VIEW COMPARISON:  Chest radiograph dated 11/12/2023. FINDINGS: Trace bilateral pleural effusions and bibasilar atelectasis. No focal consolidation or pneumothorax. Stable cardiomegaly. Atherosclerotic calcification of the aorta. No acute osseous pathology. Bilateral breast calcifications noted. IMPRESSION: Trace bilateral pleural effusions and bibasilar atelectasis. Electronically Signed   By: Elgie Collard M.D.   On: 11/18/2023 16:30    Pending Labs Unresulted Labs (From admission, onward)     Start     Ordered   11/19/23 0500  Basic metabolic panel  Daily,   R      11/18/23 1559   11/19/23 0500  Hepatic function panel  Tomorrow morning,   R        11/18/23 1559   11/19/23 0500  CBC  Daily,   R      11/18/23 1559   11/18/23 1600  Ammonia  Once,   R        11/18/23 1559   11/18/23 1600  Osmolality  Once,   R        11/18/23 1559   11/18/23 1600  RPR  Once,  R        11/18/23 1559   11/18/23 1600  Vitamin B12  Once,   R        11/18/23 1559   11/18/23 1600  Osmolality, urine  Once,   R        11/18/23 1559            Vitals/Pain Today's Vitals   11/18/23 1615 11/18/23 1630 11/18/23 1700 11/18/23 1713  BP: (!) 146/77 (!) 169/102 (!) 149/84 (!) 140/79  Pulse: (!) 108 96 (!) 103 95  Resp: (!) 28 (!) 27 (!) 24 (!) 27  Temp:    97.9 F (36.6 C)  TempSrc:    Oral  SpO2: (!) 89% 91% 92% 92%  PainSc:        Isolation Precautions Droplet precaution  Medications Medications  atorvastatin (LIPITOR) tablet 40 mg (has no administration in time range)  isosorbide mononitrate (IMDUR) 24 hr tablet 30 mg (has no  administration in time range)  metoprolol succinate (TOPROL-XL) 24 hr tablet 100 mg (has no administration in time range)  levothyroxine (SYNTHROID) tablet 88 mcg (has no administration in time range)  apixaban (ELIQUIS) tablet 2.5 mg (has no administration in time range)  sodium chloride flush (NS) 0.9 % injection 3 mL (has no administration in time range)  acetaminophen (TYLENOL) tablet 650 mg (has no administration in time range)    Or  acetaminophen (TYLENOL) suppository 650 mg (has no administration in time range)  senna-docusate (Senokot-S) tablet 1 tablet (has no administration in time range)  prochlorperazine (COMPAZINE) injection 5 mg (has no administration in time range)  0.9 %  sodium chloride infusion (has no administration in time range)  sodium chloride 0.9 % bolus 1,000 mL (0 mLs Intravenous Stopped 11/18/23 1645)    Mobility walks     Focused Assessments Neuro Assessment Handoff:  Swallow screen pass? Yes  Cardiac Rhythm: Atrial fibrillation       Neuro Assessment: Within Defined Limits Neuro Checks:   Alert and oriented x 4.  Husband states intermediate confusion noted.      If patient is a Neuro Trauma and patient is going to OR before floor call report to 4N Charge nurse: [phone] or [phone]   R Recommendations: See Admitting Provider Note  Report given to:   Additional Notes:  DNR - Limited

## 2023-11-18 NOTE — ED Notes (Signed)
Report given to Aldo, RN

## 2023-11-18 NOTE — ED Triage Notes (Signed)
Patient presents from her MD office today due to concern for critically low sodium levels. MD told her to come here. Sodium level was 124. She has experienced worsening confusion, tremors, nausea, low appetite, wheezing, congestion and cough.

## 2023-11-18 NOTE — ED Provider Notes (Signed)
  EMERGENCY DEPARTMENT AT [facility] Provider Note   CSN: [id number] Arrival date & time: 11/18/23  1305     History  Chief Complaint  Patient presents with   Altered Mental Status   Abnormal labs    [name] is a 88 y.o. female.  88 yo F with a chief complaints of fatigue confusion.  Patient has had quite a decline since about August when she was diagnosed with COVID.  Things got worse recently on Friday.  She started helping cough and fatigue and confusion and ended up going to the emergency department and was diagnosed with influenza A.  Ended up being admitted.  There is a higher level of care at her nursing facility and she just got out of it today but has not really been able to eat or drink anything she was taken to her family doctor's office it was found that her sodium level had gotten lower than prior checks.  The PCP was concerned that the patient was dehydrated and needed readmission for IV fluids and they were sent here.   Altered Mental Status      Home Medications Prior to Admission medications   Medication Sig Start Date End Date Taking? Authorizing Provider  Acetaminophen (TYLENOL PO) Take 1,000 mg by mouth 2 (two) times daily.    [provider]  atorvastatin (LIPITOR) 40 MG tablet Take 1 tablet (40 mg total) by mouth daily. Patient taking differently: Take 40 mg by mouth at bedtime. 09/05/21   Joylene Grapes, NP  calcium carbonate (TUMS - DOSED IN MG ELEMENTAL CALCIUM) 500 MG chewable tablet Chew 1 tablet by mouth daily as needed for indigestion or heartburn.    [provider]  Calcium Carbonate-Vitamin D (CALCIUM + D PO) Take 600 mg by mouth 2 (two) times daily.    [provider]  ELIQUIS 2.5 MG TABS tablet TAKE 1 TABLET(2.5 MG) BY MOUTH TWICE DAILY Patient taking differently: Take 2.5 mg by mouth 2 (two) times daily. 08/23/23   Rollene Rotunda, MD  furosemide (LASIX) 20 MG tablet TAKE 1 TABLET(20 MG) BY  MOUTH DAILY Patient taking differently: Take 20 mg by mouth 2 (two) times daily. Take 1 tablet by mouth everyday at breakfast then take 1 tablet by mouth everyday at noon. 10/08/23   Joylene Grapes, NP  isosorbide mononitrate (IMDUR) 30 MG 24 hr tablet Take 30 mg by mouth in the morning.    [provider]  levofloxacin (LEVAQUIN) 500 MG tablet Take 1 tablet (500 mg total) by mouth daily. Patient taking differently: Take 500 mg by mouth in the morning. 11/09/23   Eloisa Northern, MD  levothyroxine (SYNTHROID) 88 MCG tablet Take 88 mcg by mouth daily before breakfast.    [provider]  metoprolol succinate (TOPROL-XL) 100 MG 24 hr tablet Take 1 tablet (100 mg total) by mouth daily. Take with or immediately following a meal. 11/14/23   Pieter Partridge, MD  Multiple Vitamins-Minerals (CENTRUM SILVER 50+WOMEN) TABS Take 1 tablet by mouth daily at 12 noon.    [provider]  nitroGLYCERIN (NITROSTAT) 0.4 MG SL tablet For chest pain, tightness, or pressure. While sitting, place 1 tablet under tongue. May be used every 5 minutes as needed, for up to 15 minutes. Do not use more than 3 tablets. Patient not taking: Reported on 11/12/2023 09/30/22   Rollene Rotunda, MD  olopatadine (PATANOL) 0.1 % ophthalmic solution Place 1 drop into both eyes in the morning.  [provider]  Polyethyl Glycol-Propyl Glycol (SYSTANE OP) Place 1 drop into both eyes daily as needed (dry eyes).    [provider]      Allergies    Hydrocodone and Prednisone    Review of Systems   Review of Systems  Physical Exam Updated Vital Signs BP 102/68   Pulse 89   Temp 97.7 F (36.5 C)   Resp 20   SpO2 94%  Physical Exam Vitals and nursing note reviewed.  Constitutional:      General: She is not in acute distress.    Appearance: She is well-developed. She is not diaphoretic.  HENT:     Head: Normocephalic and atraumatic.  Eyes:     Pupils: Pupils are equal, round, and  reactive to light.  Cardiovascular:     Rate and Rhythm: Normal rate and regular rhythm.     Heart sounds: No murmur heard.    No friction rub. No gallop.  Pulmonary:     Effort: Pulmonary effort is normal.     Breath sounds: No wheezing or rales.     Comments: Coarse breath sounds in all fields. Abdominal:     General: There is no distension.     Palpations: Abdomen is soft.     Tenderness: There is no abdominal tenderness.  Musculoskeletal:        General: No tenderness.     Cervical back: Normal range of motion and neck supple.  Skin:    General: Skin is warm and dry.  Neurological:     Mental Status: She is alert and oriented to person, place, and time.  Psychiatric:        Behavior: Behavior normal.     ED Results / Procedures / Treatments   Labs (all labs ordered are listed, but only abnormal results are displayed) Labs Reviewed  COMPREHENSIVE METABOLIC PANEL - Abnormal; Notable for the following components:      Result Value   Sodium 125 (*)    Chloride 82 (*)    Glucose, Bld 113 (*)    BUN 24 (*)    Creatinine, Ser 0.35 (*)    Calcium 8.4 (*)    Total Protein 8.6 (*)    AST 42 (*)    Total Bilirubin 1.5 (*)    All other components within normal limits  CBG MONITORING, ED - Abnormal; Notable for the following components:   Glucose-Capillary 106 (*)    All other components within normal limits  CULTURE, BLOOD (ROUTINE X 2)  CULTURE, BLOOD (ROUTINE X 2)  CBC  LACTIC ACID, PLASMA  URINALYSIS, ROUTINE W REFLEX MICROSCOPIC  LACTIC ACID, PLASMA    EKG None  Radiology No results found.  Procedures .Critical Care  Performed by: Melene Plan, DO Authorized by: Melene Plan, DO   Critical care provider statement:    Critical care time (minutes):  35   Critical care time was exclusive of:  Separately billable procedures and treating other patients   Critical care was time spent personally by me on the following activities:  Development of treatment plan with  patient or surrogate, discussions with consultants, evaluation of patient's response to treatment, examination of patient, ordering and review of laboratory studies, ordering and review of radiographic studies, ordering and performing treatments and interventions, pulse oximetry, re-evaluation of patient's condition and review of old charts   Care discussed with: admitting provider       Medications Ordered in ED Medications  sodium chloride 0.9 % bolus  1,000 mL (1,000 mLs Intravenous New Bag/Given 11/18/23 1437)    ED Course/ Medical Decision Making/ A&P                                 Medical Decision Making Amount and/or Complexity of Data Reviewed Labs: ordered. Radiology: ordered.  Risk Decision regarding hospitalization.   88 yo F with a chief complaints of fatigue confusion cough.  This been going on for about a week now.  The patient was recently admitted for influenza A.  She was discharged and has been at a higher level of care at her nursing facility.  She came back home today and has not really been able to eat and drink and was brought back to her family doctor's office where they found that her sodium level was 124 and she was then sent here for IV fluids and admission.  Sodium here 125.  Lactate normal.  Chest x-ray independently interpreted by me without obvious focal infiltrate or pneumothorax.  Discussed case with the hospitalist.  The patients results and plan were reviewed and discussed.   Any x-rays performed were independently reviewed by myself.   Differential diagnosis were considered with the presenting HPI.  Medications  sodium chloride 0.9 % bolus 1,000 mL (1,000 mLs Intravenous New Bag/Given 11/18/23 1437)    Vitals:   11/18/23 1319 11/18/23 1323  BP: 102/68 102/68  Pulse: 84 89  Resp: 16 20  Temp:  97.7 F (36.5 C)  SpO2: 96% 94%    Final diagnoses:  Hyponatremia    Admission/ observation were discussed with the admitting physician, patient  and/or family and they are comfortable with the plan.          Final Clinical Impression(s) / ED Diagnoses Final diagnoses:  Hyponatremia    Rx / DC Orders ED Discharge Orders     None         Melene Plan, DO 11/18/23 1539

## 2023-11-18 NOTE — H&P (Signed)
 History and Physical    [name] ZOX:[id number] DOB: 1936/08/14 DOA: 11/18/2023  PCP: Adrian Prince, MD   Patient coming from: [facility]   Chief Complaint: Not eating, increased confusion   HPI: [name] is a 88 y.o. female with medical history significant for CAD, atrial fibrillation on Eliquis, hypothyroidism, chronic hyponatremia, and recent admission for influenza A who is brought back to the ED due to not eating or drinking, increased confusion, and low sodium on outpatient blood work.  Patient states that her appetite has been poor for months ever since she had COVID.  This has worsened since her recent hospital discharge and she has also developed increased confusion and hallucinations.  She has a persisting cough and shortness of breath that have been present for months and unchanged.  She denies abdominal pain, dysphagia, nausea, or vomiting.  She denies chest pain, headache, or focal numbness or weakness.  ED Course: Upon arrival to the ED, patient is found to be afebrile with normal heart rate and stable blood pressure.  Labs are most notable for sodium 125, BUN 24, creatinine 0.35, normal WBC, normal hemoglobin, and normal lactic acid.  Blood cultures were collected in the ED, 1 L of NS was administered, and UA was ordered but not yet collected.  Review of Systems:  All other systems reviewed and apart from HPI, are negative.  Past Medical History:  Diagnosis Date   Aortic stenosis    Atrial fibrillation (HCC)    CAD, UNSPECIFIED SITE    2009. 99% proximal RCA stenosis followed by 80% distal stenosis. The LAD has a 100% stenosis in the midsegment. The circumflex is patent with distal occlusion. She has been managed medically. Her EF was well-preserved.   CAROTID STENOSIS    Chronic diastolic (congestive) heart failure (HCC)    DIVERTICULAR DISEASE    Elevated troponin- 0.58 in setting of PSVT 10/29/2013   Essential hypertension, benign    Gastritis    H/O blood  clots 1984   DVT and PE   Hx of degenerative disc disease    HYPERLIPIDEMIA    MACULAR DEGENERATION    OSTEOPOROSIS    Rheumatoid arthritis(714.0)    Thyroid cancer (HCC) 2008    Past Surgical History:  Procedure Laterality Date   ABDOMINAL HYSTERECTOMY  1984   APPENDECTOMY  1959   BREAST CYST EXCISION     left   CARDIOVERSION N/A 08/13/2021   Procedure: CARDIOVERSION;  Surgeon: Chilton Si, MD;  Location[facility] ENDOSCOPY;  Service: Cardiovascular;  Laterality: N/A;   CATARACT EXTRACTION, BILATERAL     CHOLECYSTECTOMY  2009   COLONOSCOPY     ESOPHAGOGASTRODUODENOSCOPY     IM NAILING FEMORAL SHAFT RETROGRADE  2010   left   RIGHT/LEFT HEART CATH AND CORONARY ANGIOGRAPHY N/A 11/04/2023   Procedure: RIGHT/LEFT HEART CATH AND CORONARY ANGIOGRAPHY;  Surgeon: [name], MD;  Location: [facility] INVASIVE CV LAB;  Service: Cardiovascular;  Laterality: N/A;   TONSILLECTOMY     WRIST SURGERY  2004   Left    Social History:   reports that she has never smoked. She has never used smokeless tobacco. She reports that she does not drink alcohol and does not use drugs.  Allergies  Allergen Reactions   Hydrocodone Other (See Comments)    "feels weird"   Prednisone Nausea And Vomiting    Oral    Family History  Problem Relation Age of Onset   Heart attack Mother    Clotting disorder Mother  Heart disease Father    Diabetes Father    CAD Sister    Stroke Sister    Lung cancer Brother    Ovarian cancer Maternal Aunt    Lung cancer Maternal Uncle    Diabetes Niece      Prior to Admission medications   Medication Sig Start Date End Date Taking? Authorizing Provider  Acetaminophen (TYLENOL PO) Take 1,000 mg by mouth 2 (two) times daily.    [provider]  atorvastatin (LIPITOR) 40 MG tablet Take 1 tablet (40 mg total) by mouth daily. Patient taking differently: Take 40 mg by mouth at bedtime. 09/05/21   Joylene Grapes, NP  calcium carbonate (TUMS - DOSED IN MG  ELEMENTAL CALCIUM) 500 MG chewable tablet Chew 1 tablet by mouth daily as needed for indigestion or heartburn.    [provider]  Calcium Carbonate-Vitamin D (CALCIUM + D PO) Take 600 mg by mouth 2 (two) times daily.    [provider]  ELIQUIS 2.5 MG TABS tablet TAKE 1 TABLET(2.5 MG) BY MOUTH TWICE DAILY Patient taking differently: Take 2.5 mg by mouth 2 (two) times daily. 08/23/23   Rollene Rotunda, MD  furosemide (LASIX) 20 MG tablet TAKE 1 TABLET(20 MG) BY MOUTH DAILY Patient taking differently: Take 20 mg by mouth 2 (two) times daily. Take 1 tablet by mouth everyday at breakfast then take 1 tablet by mouth everyday at noon. 10/08/23   Joylene Grapes, NP  isosorbide mononitrate (IMDUR) 30 MG 24 hr tablet Take 30 mg by mouth in the morning.    [provider]  levofloxacin (LEVAQUIN) 500 MG tablet Take 1 tablet (500 mg total) by mouth daily. Patient taking differently: Take 500 mg by mouth in the morning. 11/09/23   Eloisa Northern, MD  levothyroxine (SYNTHROID) 88 MCG tablet Take 88 mcg by mouth daily before breakfast.    [provider]  metoprolol succinate (TOPROL-XL) 100 MG 24 hr tablet Take 1 tablet (100 mg total) by mouth daily. Take with or immediately following a meal. 11/14/23   Pieter Partridge, MD  Multiple Vitamins-Minerals (CENTRUM SILVER 50+WOMEN) TABS Take 1 tablet by mouth daily at 12 noon.    [provider]  nitroGLYCERIN (NITROSTAT) 0.4 MG SL tablet For chest pain, tightness, or pressure. While sitting, place 1 tablet under tongue. May be used every 5 minutes as needed, for up to 15 minutes. Do not use more than 3 tablets. Patient not taking: Reported on 11/12/2023 09/30/22   Rollene Rotunda, MD  olopatadine (PATANOL) 0.1 % ophthalmic solution Place 1 drop into both eyes in the morning.    [provider]  Polyethyl Glycol-Propyl Glycol (SYSTANE OP) Place 1 drop into both eyes daily as needed (dry eyes).    [provider]    Physical Exam: Vitals:   11/18/23 1319 11/18/23 1323  BP: 102/68 102/68  Pulse: 84 89  Resp: 16 20  Temp:  97.7 F (36.5 C)  SpO2: 96% 94%    Constitutional: NAD, no pallor or diaphoresis   Eyes: PERTLA, lids and conjunctivae normal ENMT: Mucous membranes are moist. Posterior pharynx clear of any exudate or lesions.   Neck: supple, no masses  Respiratory: no wheezing, no crackles. No accessory muscle use.  Cardiovascular: S1 & S2 heard, regular rate and rhythm. Pretibial pitting edema.   Abdomen: no tenderness, soft. Bowel sounds active.  Musculoskeletal: no clubbing / cyanosis. No joint deformity upper and lower extremities.   Skin: no significant rashes,  lesions, ulcers. Warm, dry, well-perfused. Neurologic: CN 2-12 grossly intact. Sensation intact. Strength 5/5 in all 4 limbs. Alert and oriented.  Psychiatric: Calm. Cooperative.    Labs and Imaging on Admission: I have personally reviewed following labs and imaging studies  CBC: Recent Labs  Lab 11/12/23 1030 11/12/23 1640 11/13/23 0559 11/18/23 1414  WBC 4.5  --   --  7.2  NEUTROABS 3.4  --   --   --   HGB 12.8 12.3 13.4 14.2  HCT 37.2 35.6* 38.0 43.7  MCV 94.4  --   --  96.3  PLT 160  --   --  226   Basic Metabolic Panel: Recent Labs  Lab 11/12/23 1151 11/18/23 1414  NA 126* 125*  K 4.3 4.7  CL 85* 82*  CO2 28 30  GLUCOSE 88 113*  BUN 20 24*  CREATININE 0.81 0.35*  CALCIUM 8.4* 8.4*   GFR: Estimated Creatinine Clearance: 41.6 mL/min (A) (by C-G formula based on SCr of 0.35 mg/dL (L)). Liver Function Tests: Recent Labs  Lab 11/12/23 1151 11/18/23 1414  AST 40 42*  ALT 21 20  ALKPHOS 55 69  BILITOT 1.0 1.5*  PROT 7.2 8.6*  ALBUMIN 3.1* 3.7   No results for input(s): "LIPASE", "AMYLASE" in the last 168 hours. No results for input(s): "AMMONIA" in the last 168 hours. Coagulation Profile: Recent Labs  Lab 11/12/23 1151  INR 1.4*   Cardiac Enzymes: Recent Labs  Lab 11/12/23 2121   CKTOTAL 93   BNP (last 3 results) No results for input(s): "PROBNP" in the last 8760 hours. HbA1C: No results for input(s): "HGBA1C" in the last 72 hours. CBG: Recent Labs  Lab 11/18/23 1334  GLUCAP 106*   Lipid Profile: No results for input(s): "CHOL", "HDL", "LDLCALC", "TRIG", "CHOLHDL", "LDLDIRECT" in the last 72 hours. Thyroid Function Tests: No results for input(s): "TSH", "T4TOTAL", "FREET4", "T3FREE", "THYROIDAB" in the last 72 hours. Anemia Panel: No results for input(s): "VITAMINB12", "FOLATE", "FERRITIN", "TIBC", "IRON", "RETICCTPCT" in the last 72 hours. Urine analysis:    Component Value Date/Time   COLORURINE YELLOW 11/12/2023 1116   APPEARANCEUR HAZY (A) 11/12/2023 1116   LABSPEC 1.010 11/12/2023 1116   PHURINE 7.0 11/12/2023 1116   GLUCOSEU NEGATIVE 11/12/2023 1116   HGBUR NEGATIVE 11/12/2023 1116   BILIRUBINUR NEGATIVE 11/12/2023 1116   BILIRUBINUR neg 11/09/2023 1513   KETONESUR NEGATIVE 11/12/2023 1116   PROTEINUR NEGATIVE 11/12/2023 1116   UROBILINOGEN 0.2 11/09/2023 1513   UROBILINOGEN 0.2 11/05/2013 1332   NITRITE NEGATIVE 11/12/2023 1116   LEUKOCYTESUR MODERATE (A) 11/12/2023 1116   Sepsis Labs: @LABRCNTIP (procalcitonin:4,lacticidven:4) ) Recent Results (from the past 240 hours)  Urine Culture     Status: None   Collection Time: 11/09/23  3:15 PM  Result Value Ref Range Status   Urine Culture, Routine Final report  Final   Organism ID, Bacteria Comment  Final    Comment: Culture shows less than 10,000 colony forming units of bacteria per milliliter of urine. This colony count is not generally considered to be clinically significant.   Resp panel by RT-PCR (RSV, Flu A&B, Covid) Anterior Nasal Swab     Status: Abnormal   Collection Time: 11/12/23  9:30 AM   Specimen: Anterior Nasal Swab  Result Value Ref Range Status   SARS Coronavirus 2 by RT PCR NEGATIVE NEGATIVE Final   Influenza A by PCR POSITIVE (A) NEGATIVE Final   Influenza B by PCR  NEGATIVE NEGATIVE Final    Comment: (NOTE) The Xpert  Xpress SARS-CoV-2/FLU/RSV plus assay is intended as an aid in the diagnosis of influenza from Nasopharyngeal swab specimens and should not be used as a sole basis for treatment. Nasal washings and aspirates are unacceptable for Xpert Xpress SARS-CoV-2/FLU/RSV testing.  Fact Sheet for Patients: BloggerCourse.com  Fact Sheet for Healthcare Providers: SeriousBroker.it  This test is not yet approved or cleared by the [place] FDA and has been authorized for detection and/or diagnosis of SARS-CoV-2 by FDA under an Emergency Use Authorization (EUA). This EUA will remain in effect (meaning this test can be used) for the duration of the COVID-19 declaration under Section 564(b)(1) of the Act, 21 U.S.C. section 360bbb-3(b)(1), unless the authorization is terminated or revoked.     Resp Syncytial Virus by PCR NEGATIVE NEGATIVE Final    Comment: (NOTE) Fact Sheet for Patients: BloggerCourse.com  Fact Sheet for Healthcare Providers: SeriousBroker.it  This test is not yet approved or cleared by the [place] FDA and has been authorized for detection and/or diagnosis of SARS-CoV-2 by FDA under an Emergency Use Authorization (EUA). This EUA will remain in effect (meaning this test can be used) for the duration of the COVID-19 declaration under Section 564(b)(1) of the Act, 21 U.S.C. section 360bbb-3(b)(1), unless the authorization is terminated or revoked.  Performed at [facility], [address], [place], [place] [address]   Urine Culture     Status: Abnormal   Collection Time: 11/12/23 11:16 AM   Specimen: Urine, Clean Catch  Result Value Ref Range Status   Specimen Description URINE, CLEAN CATCH  Final   Special Requests NONE Reflexed from [id number]  Final   Culture (A)  Final    >=100,000 COLONIES/mL  DIPHTHEROIDS(CORYNEBACTERIUM SPECIES) Standardized susceptibility testing for this organism is not available. Performed at [facility], [address] [place], [place] [address]    Report Status 11/14/2023 FINAL  Final     Radiological Exams on Admission: No results found.   Assessment/Plan   1. Acute encephalopathy  - Increased confusion and hallucinations since recent hospital discharge    - No acute neuro deficits identified; she appears deconditioned and dehydrated; TSH was normal a wk ago - Use delirium precautions, continue IVF hydration, check ammonia, B12, and RPR   2. Hyponatremia, chronic  - Serum sodium 125 on admission  - She was given 1 liter NS in ED  - Hold Lasix, limit free water intake   3. Chronic HFpEF  - Hold Lasix, monitor volume status   4. PAF  - Eliquis, Toprol   5. CAD  - No angina - Lipitor, Imdur, Eliquis    6. Hypothyroidism  - Synthroid     DVT prophylaxis: Eliquis  Code Status: DNR  Level of Care: Level of care: Telemetry Family Communication: Husband at bedside   Disposition Plan:  Patient is from: ILF  Anticipated d/c is to: TBD Anticipated d/c date is: 2/14 or 11/20/23  Patient currently: Pending improved mental status, tolerance of adequate oral intake  Consults called: None  Admission status: Observation     Briscoe Deutscher, MD [facility]  11/18/2023, 4:00 PM

## 2023-11-19 ENCOUNTER — Inpatient Hospital Stay (HOSPITAL_COMMUNITY): Payer: Medicare Other

## 2023-11-19 DIAGNOSIS — E860 Dehydration: Secondary | ICD-10-CM | POA: Diagnosis present

## 2023-11-19 DIAGNOSIS — E871 Hypo-osmolality and hyponatremia: Secondary | ICD-10-CM | POA: Diagnosis present

## 2023-11-19 DIAGNOSIS — G9349 Other encephalopathy: Secondary | ICD-10-CM | POA: Diagnosis present

## 2023-11-19 DIAGNOSIS — I2510 Atherosclerotic heart disease of native coronary artery without angina pectoris: Secondary | ICD-10-CM | POA: Diagnosis present

## 2023-11-19 DIAGNOSIS — I5032 Chronic diastolic (congestive) heart failure: Secondary | ICD-10-CM | POA: Diagnosis present

## 2023-11-19 DIAGNOSIS — Z85850 Personal history of malignant neoplasm of thyroid: Secondary | ICD-10-CM | POA: Diagnosis not present

## 2023-11-19 DIAGNOSIS — I110 Hypertensive heart disease with heart failure: Secondary | ICD-10-CM | POA: Diagnosis present

## 2023-11-19 DIAGNOSIS — Z515 Encounter for palliative care: Secondary | ICD-10-CM | POA: Diagnosis not present

## 2023-11-19 DIAGNOSIS — Z7189 Other specified counseling: Secondary | ICD-10-CM | POA: Diagnosis not present

## 2023-11-19 DIAGNOSIS — E039 Hypothyroidism, unspecified: Secondary | ICD-10-CM | POA: Diagnosis present

## 2023-11-19 DIAGNOSIS — M069 Rheumatoid arthritis, unspecified: Secondary | ICD-10-CM | POA: Diagnosis present

## 2023-11-19 DIAGNOSIS — Z832 Family history of diseases of the blood and blood-forming organs and certain disorders involving the immune mechanism: Secondary | ICD-10-CM | POA: Diagnosis not present

## 2023-11-19 DIAGNOSIS — I480 Paroxysmal atrial fibrillation: Secondary | ICD-10-CM | POA: Diagnosis present

## 2023-11-19 DIAGNOSIS — Z7901 Long term (current) use of anticoagulants: Secondary | ICD-10-CM | POA: Diagnosis not present

## 2023-11-19 DIAGNOSIS — Z681 Body mass index (BMI) 19 or less, adult: Secondary | ICD-10-CM | POA: Diagnosis not present

## 2023-11-19 DIAGNOSIS — Z8616 Personal history of COVID-19: Secondary | ICD-10-CM | POA: Diagnosis not present

## 2023-11-19 DIAGNOSIS — Z66 Do not resuscitate: Secondary | ICD-10-CM | POA: Diagnosis present

## 2023-11-19 DIAGNOSIS — Z86711 Personal history of pulmonary embolism: Secondary | ICD-10-CM | POA: Diagnosis not present

## 2023-11-19 DIAGNOSIS — G9340 Encephalopathy, unspecified: Secondary | ICD-10-CM | POA: Diagnosis not present

## 2023-11-19 DIAGNOSIS — M810 Age-related osteoporosis without current pathological fracture: Secondary | ICD-10-CM | POA: Diagnosis present

## 2023-11-19 DIAGNOSIS — F32A Depression, unspecified: Secondary | ICD-10-CM | POA: Diagnosis present

## 2023-11-19 DIAGNOSIS — R627 Adult failure to thrive: Secondary | ICD-10-CM | POA: Diagnosis present

## 2023-11-19 DIAGNOSIS — I350 Nonrheumatic aortic (valve) stenosis: Secondary | ICD-10-CM | POA: Diagnosis present

## 2023-11-19 DIAGNOSIS — E222 Syndrome of inappropriate secretion of antidiuretic hormone: Secondary | ICD-10-CM | POA: Diagnosis present

## 2023-11-19 DIAGNOSIS — E785 Hyperlipidemia, unspecified: Secondary | ICD-10-CM | POA: Diagnosis present

## 2023-11-19 DIAGNOSIS — Z8249 Family history of ischemic heart disease and other diseases of the circulatory system: Secondary | ICD-10-CM | POA: Diagnosis not present

## 2023-11-19 DIAGNOSIS — Z79890 Hormone replacement therapy (postmenopausal): Secondary | ICD-10-CM | POA: Diagnosis not present

## 2023-11-19 LAB — BASIC METABOLIC PANEL
Anion gap: 13 (ref 5–15)
BUN: 20 mg/dL (ref 8–23)
CO2: 28 mmol/L (ref 22–32)
Calcium: 8.1 mg/dL — ABNORMAL LOW (ref 8.9–10.3)
Chloride: 85 mmol/L — ABNORMAL LOW (ref 98–111)
Creatinine, Ser: 0.56 mg/dL (ref 0.44–1.00)
GFR, Estimated: >60 mL/min (ref >60)
Glucose, Bld: 110 mg/dL — ABNORMAL HIGH (ref 70–99)
Potassium: 4.4 mmol/L (ref 3.5–5.1)
Sodium: 126 mmol/L — ABNORMAL LOW (ref 135–145)

## 2023-11-19 LAB — HEPATIC FUNCTION PANEL
ALT: 22 U/L (ref 0–44)
AST: 39 U/L (ref 15–41)
Albumin: 3.6 g/dL (ref 3.5–5.0)
Alkaline Phosphatase: 71 U/L (ref 38–126)
Bilirubin, Direct: 0.4 mg/dL — ABNORMAL HIGH (ref 0.0–0.2)
Indirect Bilirubin: 1.1 mg/dL — ABNORMAL HIGH (ref 0.3–0.9)
Total Bilirubin: 1.5 mg/dL — ABNORMAL HIGH (ref 0.0–1.2)
Total Protein: 8.3 g/dL — ABNORMAL HIGH (ref 6.5–8.1)

## 2023-11-19 LAB — CBC
HCT: 41.7 % (ref 36.0–46.0)
Hemoglobin: 13.7 g/dL (ref 12.0–15.0)
MCH: 31.8 pg (ref 26.0–34.0)
MCHC: 32.9 g/dL (ref 30.0–36.0)
MCV: 96.8 fL (ref 80.0–100.0)
Platelets: 203 10*3/uL (ref 150–400)
RBC: 4.31 MIL/uL (ref 3.87–5.11)
RDW: 12.8 % (ref 11.5–15.5)
WBC: 8.2 10*3/uL (ref 4.0–10.5)
nRBC: 0 % (ref 0.0–0.2)

## 2023-11-19 LAB — CORTISOL: Cortisol, Plasma: 25.6 ug/dL

## 2023-11-19 LAB — BRAIN NATRIURETIC PEPTIDE: B Natriuretic Peptide: 663.1 pg/mL — ABNORMAL HIGH (ref 0.0–100.0)

## 2023-11-19 LAB — RPR: RPR Ser Ql: NONREACTIVE

## 2023-11-19 MED ORDER — SODIUM CHLORIDE 1 G PO TABS
1.0000 g | ORAL_TABLET | Freq: Three times a day (TID) | ORAL | Status: DC
  Administered 2023-11-19 – 2023-11-23 (×14): 1 g via ORAL
  Filled 2023-11-19 (×14): qty 1

## 2023-11-19 MED ORDER — SODIUM CHLORIDE 0.9 % IV SOLN: INTRAVENOUS | Status: AC

## 2023-11-19 NOTE — Progress Notes (Signed)
PROGRESS NOTE  [name] UJW:[id number] DOB: May 15, 1936 DOA: 11/18/2023 PCP: Adrian Prince, MD   LOS: 0 days   Brief Narrative / Interim history: 88 year old female with history of CAD, PAF on Eliquis, hypothyroidism, chronic hyponatremia, recent admission for influenza A comes in back to the hospital for poor p.o. intake, increased confusion, increased shortness of breath and low sodium on outpatient blood work.  Patient and her husband had COVID late last year, and here she has been having very poor appetite since.  She reports worsening shortness of breath, but does admit that this has been going on for at least several years.  No abdominal discomfort, no nausea or vomiting, no fever or chills.  Husband also reports occasional jerking movements in her upper extremities  Subjective / 24h Interval events: Complains of weakness today, significant dyspnea  Assesement and Plan: Principal Problem:   Acute encephalopathy Active Problems:   PAF (paroxysmal atrial fibrillation) (HCC)   History of pulmonary embolism   Essential hypertension, benign   Hypothyroidism   CAD- severe LAD and RCA disease with collaterals from CFX Sept 2009. Medical Rx   Hyponatremia   Chronic heart failure with preserved ejection fraction (HFpEF) (HCC)  Principal problem Acute on chronic hyponatremia -this has been an issue for several years, going back as far as 2015.  Her sodium can range as an outpatient from upper 120s to low 130s. -She does appear intravascularly depleted, so her hyponatremia is multifactorial in the setting of poor solute intake, also contributing is her home furosemide given renal salt loss with a high urine sodium -TSH unremarkable at 0.39, check cortisol for completeness -Sodium remains low at 126, hypochloremic, start salt tabs due to poor solute intake and provide limited fluids  Active problems Chronic diastolic CHF, elevated PA pressure -recent 2D echo showed normal LVEF, right  heart cath done January 2025 showed elevated RV pressure and PA pressure.  Imaging also showed mild mitral valve regurgitation, mild to moderate tricuspid valve regurgitation and aortic stenosis -There is a degree of third spacing with elevated BNP and trace bilateral pleural effusions, as well as trace lower extremity edema, but she is intravascularly depleted.   Increased confusion-obtain MRI of the brain.  Remains confused today  Hypothyroidism-continue Synthroid, TSH as above  PAF-continue metoprolol, Eliquis for anticoagulation  Hyperlipidemia-continue atorvastatin  CAD-cath done end of January showed CAD, medical management recommended.  Continue Imdur, beta-blockers, statin  Scheduled Meds:  apixaban  2.5 mg Oral BID   atorvastatin  40 mg Oral QHS   isosorbide mononitrate  30 mg Oral q AM   levothyroxine  88 mcg Oral QAC breakfast   metoprolol succinate  100 mg Oral Daily   sodium chloride flush  3 mL Intravenous Q12H   sodium chloride  1 g Oral TID WC   Continuous Infusions:  sodium chloride 75 mL/hr at 11/19/23 1036   PRN Meds:.acetaminophen **OR** acetaminophen, prochlorperazine, senna-docusate  Current Outpatient Medications  Medication Instructions   acetaminophen (TYLENOL) 500-1,000 mg, See admin instructions   amLODipine (NORVASC) 5 mg, Oral, Daily at bedtime   atorvastatin (LIPITOR) 40 mg, Oral, Daily,     calcium carbonate (TUMS - DOSED IN MG ELEMENTAL CALCIUM) 500 MG chewable tablet 1 tablet, Oral, Daily PRN   Calcium Carbonate-Vitamin D (CALCIUM + D PO) 1 tablet, Oral, See admin instructions, Take 1 tablet by mouth with lunch and supper   ELIQUIS 2.5 MG TABS tablet TAKE 1 TABLET(2.5 MG) BY MOUTH TWICE DAILY   furosemide (LASIX)  20 MG tablet TAKE 1 TABLET(20 MG) BY MOUTH DAILY   isosorbide mononitrate (IMDUR) 30 mg, See admin instructions   levofloxacin (LEVAQUIN) 500 mg, Oral, Daily   levothyroxine (SYNTHROID) 88 mcg, Daily before breakfast   losartan (COZAAR)  50 mg, Daily with breakfast   metoprolol succinate (TOPROL-XL) 100 mg, Oral, Daily, Take with or immediately following a meal.   Multiple Vitamins-Minerals (CENTRUM SILVER 50+WOMEN) TABS 1 tablet, Daily with lunch   Multiple Vitamins-Minerals (PRESERVISION AREDS) CAPS 1 capsule, See admin instructions   nitroGLYCERIN (NITROSTAT) 0.4 MG SL tablet For chest pain, tightness, or pressure. While sitting, place 1 tablet under tongue. May be used every 5 minutes as needed, for up to 15 minutes. Do not use more than 3 tablets.   Olopatadine HCl (PATADAY OP) 1 drop, Both Eyes, Every morning    Diet Orders (From admission, onward)     Start     Ordered   11/18/23 1644  Diet regular Fluid consistency: Thin; Fluid restriction: 1500 mL Fluid  Diet effective now       Question Answer Comment  Fluid consistency: Thin   Fluid restriction: 1500 mL Fluid      11/18/23 1643            DVT prophylaxis: apixaban (ELIQUIS) tablet 2.5 mg Start: 11/18/23 2200 apixaban (ELIQUIS) tablet 2.5 mg   Lab Results  Component Value Date   PLT 203 11/19/2023      Code Status: Limited: Do not attempt resuscitation (DNR) -DNR-LIMITED -Do Not Intubate/DNI   Family Communication: Husband at bedside  Status is: Observation The patient will require care spanning > 2 midnights and should be moved to inpatient because: Remains confused, hyponatremic   Level of care: Telemetry  Consultants:  None  Objective: Vitals:   11/18/23 1817 11/18/23 2225 11/19/23 0508 11/19/23 1026  BP: (!) 152/82 (!) 148/79 135/78 138/83  Pulse: 88 90 95 83  Resp:  18 18   Temp: 98.2 F (36.8 C) 97.7 F (36.5 C) (!) 97.5 F (36.4 C)   TempSrc: Oral Oral Oral   SpO2: 95% 91% 94%     Intake/Output Summary (Last 24 hours) at 11/19/2023 1128 Last data filed at 11/18/2023 1645 Gross per 24 hour  Intake 1000 ml  Output --  Net 1000 ml   Wt Readings from Last 3 Encounters:  11/12/23 51.3 kg  11/09/23 51.4 kg  11/04/23 52.6  kg    Examination:  Constitutional: NAD Eyes: no scleral icterus ENMT: Mucous membranes are moist.  Neck: normal, supple Respiratory: clear to auscultation bilaterally, no wheezing, no crackles. Normal respiratory effort. No accessory muscle use.  Cardiovascular: Irregular, 3/6 SEM.  Trace edema Abdomen: non distended, no tenderness. Bowel sounds positive.  Musculoskeletal: no clubbing / cyanosis.  Skin: no rashes Neurologic: non focal, intermittent jerking movements  Data Reviewed: I have independently reviewed following labs and imaging studies   CBC Recent Labs  Lab 11/12/23 1640 11/13/23 0559 11/18/23 1414 11/19/23 0536  WBC  --   --  7.2 8.2  HGB 12.3 13.4 14.2 13.7  HCT 35.6* 38.0 43.7 41.7  PLT  --   --  226 203  MCV  --   --  96.3 96.8  MCH  --   --  31.3 31.8  MCHC  --   --  32.5 32.9  RDW  --   --  13.0 12.8    Recent Labs  Lab 11/12/23 1151 11/12/23 2121 11/18/23 1414 11/18/23 1436 11/18/23 1740 11/19/23  0536  NA 126*  --  125*  --   --  126*  K 4.3  --  4.7  --   --  4.4  CL 85*  --  82*  --   --  85*  CO2 28  --  30  --   --  28  GLUCOSE 88  --  113*  --   --  110*  BUN 20  --  24*  --   --  20  CREATININE 0.81  --  0.35*  --   --  0.56  CALCIUM 8.4*  --  8.4*  --   --  8.1*  AST 40  --  42*  --   --  39  ALT 21  --  20  --   --  22  ALKPHOS 55  --  69  --   --  71  BILITOT 1.0  --  1.5*  --   --  1.5*  ALBUMIN 3.1*  --  3.7  --   --  3.6  LATICACIDVEN  --   --   --  1.5  --   --   INR 1.4*  --   --   --   --   --   TSH  --  0.390  --   --   --   --   AMMONIA  --   --   --   --  <10  --   BNP  --   --   --   --   --  663.1*    ------------------------------------------------------------------------------------------------------------------ No results for input(s): "CHOL", "HDL", "LDLCALC", "TRIG", "CHOLHDL", "LDLDIRECT" in the last 72 hours.  Lab Results  Component Value Date   HGBA1C 6.3 (H) 08/06/2021    ------------------------------------------------------------------------------------------------------------------ No results for input(s): "TSH", "T4TOTAL", "T3FREE", "THYROIDAB" in the last 72 hours.  Invalid input(s): "FREET3"  Cardiac Enzymes No results for input(s): "CKMB", "TROPONINI", "MYOGLOBIN" in the last 168 hours.  Invalid input(s): "CK" ------------------------------------------------------------------------------------------------------------------    Component Value Date/Time   BNP 663.1 (H) 11/19/2023 0536    CBG: Recent Labs  Lab 11/18/23 1334  GLUCAP 106*    Recent Results (from the past 240 hours)  Urine Culture     Status: None   Collection Time: 11/09/23  3:15 PM  Result Value Ref Range Status   Urine Culture, Routine Final report  Final   Organism ID, Bacteria Comment  Final    Comment: Culture shows less than 10,000 colony forming units of bacteria per milliliter of urine. This colony count is not generally considered to be clinically significant.   Resp panel by RT-PCR (RSV, Flu A&B, Covid) Anterior Nasal Swab     Status: Abnormal   Collection Time: 11/12/23  9:30 AM   Specimen: Anterior Nasal Swab  Result Value Ref Range Status   SARS Coronavirus 2 by RT PCR NEGATIVE NEGATIVE Final   Influenza A by PCR POSITIVE (A) NEGATIVE Final   Influenza B by PCR NEGATIVE NEGATIVE Final    Comment: (NOTE) The Xpert Xpress SARS-CoV-2/FLU/RSV plus assay is intended as an aid in the diagnosis of influenza from Nasopharyngeal swab specimens and should not be used as a sole basis for treatment. Nasal washings and aspirates are unacceptable for Xpert Xpress SARS-CoV-2/FLU/RSV testing.  Fact Sheet for Patients: BloggerCourse.com  Fact Sheet for Healthcare Providers: SeriousBroker.it  This test is not yet approved or cleared by the [place] FDA and has been authorized for detection  and/or diagnosis of  SARS-CoV-2 by FDA under an Emergency Use Authorization (EUA). This EUA will remain in effect (meaning this test can be used) for the duration of the COVID-19 declaration under Section 564(b)(1) of the Act, 21 U.S.C. section 360bbb-3(b)(1), unless the authorization is terminated or revoked.     Resp Syncytial Virus by PCR NEGATIVE NEGATIVE Final    Comment: (NOTE) Fact Sheet for Patients: BloggerCourse.com  Fact Sheet for Healthcare Providers: SeriousBroker.it  This test is not yet approved or cleared by the [place] FDA and has been authorized for detection and/or diagnosis of SARS-CoV-2 by FDA under an Emergency Use Authorization (EUA). This EUA will remain in effect (meaning this test can be used) for the duration of the COVID-19 declaration under Section 564(b)(1) of the Act, 21 U.S.C. section 360bbb-3(b)(1), unless the authorization is terminated or revoked.  Performed at [facility], [address] [place], [place] [address]   Urine Culture     Status: Abnormal   Collection Time: 11/12/23 11:16 AM   Specimen: Urine, Clean Catch  Result Value Ref Range Status   Specimen Description URINE, CLEAN CATCH  Final   Special Requests NONE Reflexed from [id number]  Final   Culture (A)  Final    >=100,000 COLONIES/mL DIPHTHEROIDS(CORYNEBACTERIUM SPECIES) Standardized susceptibility testing for this organism is not available. Performed at [facility], [address], [place], [place] [address]    Report Status 11/14/2023 FINAL  Final  Blood culture (routine x 2)     Status: None (Preliminary result)   Collection Time: 11/18/23  2:46 PM   Specimen: BLOOD RIGHT ARM  Result Value Ref Range Status   Specimen Description   Final    BLOOD RIGHT ARM Performed at [facility], [address] [place], [place] [address]    Special Requests   Final    BOTTLES DRAWN AEROBIC AND ANAEROBIC Blood Culture results may not  be optimal due to an inadequate volume of blood received in culture bottles Performed at [facility], [address] [place], [place] [address]    Culture   Final    NO GROWTH < 24 HOURS Performed at [facility], [address] [place], [place] [address]    Report Status PENDING  Incomplete  Blood culture (routine x 2)     Status: None (Preliminary result)   Collection Time: 11/18/23  2:47 PM   Specimen: BLOOD LEFT ARM  Result Value Ref Range Status   Specimen Description   Final    BLOOD LEFT ARM Performed at [facility], [address], [place], [place] [address]    Special Requests   Final    BOTTLES DRAWN AEROBIC AND ANAEROBIC Blood Culture results may not be optimal due to an inadequate volume of blood received in culture bottles Performed at [facility], [address], [place], [place] [address]    Culture   Final    NO GROWTH < 24 HOURS Performed at [facility], [address] [place], [place] [address]    Report Status PENDING  Incomplete     Radiology Studies: DG Chest Port 1 View Result Date: 11/18/2023 CLINICAL DATA:  Cough and fatigue. EXAM: PORTABLE CHEST 1 VIEW COMPARISON:  Chest radiograph dated 11/12/2023. FINDINGS: Trace bilateral pleural effusions and bibasilar atelectasis. No focal consolidation or pneumothorax. Stable cardiomegaly. Atherosclerotic calcification of the aorta. No acute osseous pathology. Bilateral breast calcifications noted. IMPRESSION: Trace  bilateral pleural effusions and bibasilar atelectasis. Electronically Signed   By: Elgie Collard M.D.   On: 11/18/2023 16:30     Pamella Pert, MD, PhD [facility]  Between 7 am - 7 pm I am available, please contact me via Amion (for emergencies) or Securechat (non urgent messages)  Between 7 pm - 7 am I am not available, please contact night coverage MD/APP via Amion

## 2023-11-19 NOTE — Plan of Care (Signed)

## 2023-11-19 NOTE — Evaluation (Signed)
Physical Therapy Evaluation Patient Details Name: [name] MRN: [id number] DOB: 1936/05/01 Today's Date: 11/19/2023  History of Present Illness  Pt is an 88 year old female admitted 11/18/23 for acute encephalopathy and acute on chronic hyponatremia. Pt with recent admission for influenza A.  PMHx: PAF on Eliquis, CAD, HTN, osteoporosis, carotid stenosis, RA, CHF  Clinical Impression  Pt admitted with above diagnosis.  Pt currently with functional limitations due to the deficits listed below (see PT Problem List). Pt will benefit from acute skilled PT to increase their independence and safety with mobility to allow discharge.  Spouse present and reports pt has had decreased mobility since recent hospitalization.  Pt had stayed in "care and wellness" section of [facility] briefly but had returned to their house prior to this admission.  Spouse feels pt can return home upon d/c.  Recommend f/u HHPT.         If plan is discharge home, recommend the following: A little help with walking and/or transfers;A little help with bathing/dressing/bathroom;Help with stairs or ramp for entrance;Supervision due to cognitive status   Can travel by private vehicle        Equipment Recommendations None recommended by PT  Recommendations for Other Services       Functional Status Assessment Patient has had a recent decline in their functional status and demonstrates the ability to make significant improvements in function in a reasonable and predictable amount of time.     Precautions / Restrictions Precautions Precautions: Fall      Mobility  Bed Mobility Overal bed mobility: Needs Assistance Bed Mobility: Sit to Supine       Sit to supine: Supervision        Transfers Overall transfer level: Needs assistance Equipment used: Rolling walker (2 wheels) Transfers: Sit to/from Stand Sit to Stand: Contact guard assist           General transfer comment: CGA for safety     Ambulation/Gait Ambulation/Gait assistance: Contact guard assist Gait Distance (Feet): 12 Feet Assistive device: Rolling walker (2 wheels) Gait Pattern/deviations: Step-through pattern, Decreased stride length, Trunk flexed       General Gait Details: pt in recliner on arrival and ambulated around bed and returned to bed; felt unable to ambulate farther today due to fatigue and weakness  Stairs            Wheelchair Mobility     Tilt Bed    Modified Rankin (Stroke Patients Only)       Balance Overall balance assessment: Needs assistance         Standing balance support: Bilateral upper extremity supported, During functional activity Standing balance-Leahy Scale: Poor Standing balance comment: typically uses rollator, spouse denies any recent falls                             Pertinent Vitals/Pain Pain Assessment Pain Assessment: No/denies pain    Home Living Family/patient expects to be discharged to:: Private residence Living Arrangements: Spouse/significant other   Type of Home: House Home Access: Level entry       Home Layout: One level Home Equipment: Rollator (4 wheels) Additional Comments: house at [employer] [facility]    Prior Function Prior Level of Function : Independent/Modified Independent             Mobility Comments: typically modified independent with rollator however less ambulation distances since recent admission       Extremity/Trunk Assessment  Lower Extremity Assessment Lower Extremity Assessment: Generalized weakness    Cervical / Trunk Assessment Cervical / Trunk Assessment: Kyphotic  Communication   Communication Communication: No apparent difficulties    Cognition Arousal: Alert Behavior During Therapy: Flat affect                           PT - Cognition Comments: husband present and reports pt has confusion         Cueing       General Comments      Exercises      Assessment/Plan    PT Assessment Patient needs continued PT services  PT Problem List Decreased mobility;Decreased activity tolerance;Decreased strength;Decreased knowledge of use of DME;Decreased cognition       PT Treatment Interventions DME instruction;Balance training;Functional mobility training;Therapeutic activities;Therapeutic exercise;Patient/family education;Gait training    PT Goals (Current goals can be found in the Care Plan section)  Acute Rehab PT Goals PT Goal Formulation: With patient/family Time For Goal Achievement: 12/03/23 Potential to Achieve Goals: Good    Frequency Min 1X/week     Co-evaluation               AM-PAC PT "6 Clicks" Mobility  Outcome Measure Help needed turning from your back to your side while in a flat bed without using bedrails?: A Little Help needed moving from lying on your back to sitting on the side of a flat bed without using bedrails?: A Little Help needed moving to and from a bed to a chair (including a wheelchair)?: A Little Help needed standing up from a chair using your arms (e.g., wheelchair or bedside chair)?: A Little Help needed to walk in hospital room?: A Little Help needed climbing 3-5 steps with a railing? : A Lot 6 Click Score: 17    End of Session Equipment Utilized During Treatment: Gait belt Activity Tolerance: Patient tolerated treatment well Patient left: in bed;with call bell/phone within reach;with family/visitor present;with bed alarm set   PT Visit Diagnosis: Difficulty in walking, not elsewhere classified (R26.2);Muscle weakness (generalized) (M62.81)    Time: 4401-0272 PT Time Calculation (min) (ACUTE ONLY): 11 min   Charges:   PT Evaluation $PT Eval Low Complexity: 1 Low   PT General Charges $$ ACUTE PT VISIT: 1 Visit        Thomasene Mohair PT, DPT Physical Therapist Acute Rehabilitation Services Office: [phone]   Kati L Payson 11/19/2023, 1:14 PM

## 2023-11-19 NOTE — Plan of Care (Signed)
  Problem: Education: Goal: Knowledge of General Education information will improve Description: Including pain rating scale, medication(s)/side effects and non-pharmacologic comfort measures Outcome: Progressing   Problem: Clinical Measurements: Goal: Ability to maintain clinical measurements within normal limits will improve Outcome: Progressing Goal: Will remain free from infection Outcome: Progressing   Problem: Activity: Goal: Risk for activity intolerance will decrease Outcome: Progressing   Problem: Pain Managment: Goal: General experience of comfort will improve and/or be controlled Outcome: Progressing   Problem: Safety: Goal: Ability to remain free from injury will improve Outcome: Progressing

## 2023-11-19 NOTE — TOC Initial Note (Addendum)
Transition of Care [facility]) - Initial/Assessment Note    Patient Details  Name: [name] MRN: [id number] Date of Birth: [birth year]  Transition of Care [facility]) CM/SW Contact:    Diona Browner, LCSW Phone Number: 11/19/2023, 2:51 PM  Clinical Narrative:                 Pt recommended for HHPT. Pt spouse stated that they reside at [facility] and would like to receive HHPT through [facility] when pt d/c home. [facility] confirmed they are able to accept pt for HHPT. Will need orders prior to d/c.TOC will continue to follow.   Expected Discharge Plan: Home w Home Health Services Barriers to Discharge: No Barriers Identified   Patient Goals and CMS Choice Patient states their goals for this hospitalization and ongoing recovery are:: return home   Choice offered to / list presented to : Spouse [place] ownership interest in [facility].provided to:: Spouse    Expected Discharge Plan and Services   Discharge Planning Services: NA Post Acute Care Choice: Home Health Living arrangements for the past 2 months: Independent Living Facility                   DME Agency: NA                  Prior Living Arrangements/Services Living arrangements for the past 2 months: Independent Living Facility Lives with:: Spouse Patient language and need for interpreter reviewed:: Yes Do you feel safe going back to the place where you live?: Yes      Need for Family Participation in Patient Care: Yes (Comment) Care giver support system in place?: Yes (comment)   Criminal Activity/Legal Involvement Pertinent to Current Situation/Hospitalization: No - Comment as needed  Activities of Daily Living   ADL Screening (condition at time of admission) Independently performs ADLs?: No Does the patient have a NEW difficulty with bathing/dressing/toileting/self-feeding that is expected to last >3 days?: No Does the patient have a NEW difficulty with getting in/out of bed, walking,  or climbing stairs that is expected to last >3 days?: No Does the patient have a NEW difficulty with communication that is expected to last >3 days?: No Is the patient deaf or have difficulty hearing?: No Does the patient have difficulty seeing, even when wearing glasses/contacts?: No Does the patient have difficulty concentrating, remembering, or making decisions?: No  Permission Sought/Granted                  Emotional Assessment Appearance:: Appears stated age Attitude/Demeanor/Rapport: Unable to Assess Affect (typically observed): Unable to Assess Orientation: : Fluctuating Orientation (Suspected and/or reported Sundowners) Alcohol / Substance Use: Not Applicable Psych Involvement: No (comment)  Admission diagnosis:  Hyponatremia [E87.1] Acute encephalopathy [G93.40] Patient Active Problem List   Diagnosis Date Noted   Acute encephalopathy 11/18/2023   Melena 11/12/2023   Influenza A 11/12/2023   Sepsis (HCC) 11/12/2023   Cystitis 11/12/2023   Urinary tract infection without hematuria 11/09/2023   Acute bronchitis due to other specified organisms 11/09/2023   Excessive ear wax, left 03/23/2023   Nonrheumatic aortic valve stenosis 03/20/2023   Acute respiratory failure with hypoxia (HCC) 09/25/2022   Hypervolemia 09/25/2022   DNR (do not resuscitate)/DNI(Do Not Intubate) 09/25/2022   Chronic heart failure with preserved ejection fraction (HFpEF) (HCC) 09/25/2022   PAF (paroxysmal atrial fibrillation) (HCC) 08/05/2021   Stable branch retinal vein occlusion of left eye 08/19/2020   Exudative age-related macular degeneration of right  eye with inactive choroidal neovascularization (HCC) 08/19/2020   Dry eyes 04/16/2020   Intermediate stage nonexudative age-related macular degeneration of both eyes 04/16/2020   Dizziness 11/30/2019   Dyslipidemia 05/18/2019   Coronary artery disease involving native coronary artery of native heart without angina pectoris 01/01/2019    Tremor 11/01/2018   Shortness of breath 09/25/2018   Hypothyroidism 10/30/2013   TIA (transient ischemic attack)- remote 10/30/2013   Dyspnea 10/28/2013   PSVT (paroxysmal supraventricular tachycardia) (HCC) 10/28/2013   History of pulmonary embolism 10/28/2013   Hyponatremia 10/28/2013   Osteoporosis    Essential hypertension, benign 07/11/2009   HYPERLIPIDEMIA 01/10/2009   CAROTID STENOSIS- moderate 01/10/2009   Macular degeneration (senile) of retina 01/09/2009   CAD- severe LAD and RCA disease with collaterals from CFX Sept 2009. Medical Rx 01/09/2009   Diverticulosis of colon 01/09/2009   Rheumatoid arthritis (HCC) 01/09/2009   Osteoporosis 01/09/2009   PCP:  Adrian Prince, MD Pharmacy:   [facility] [phone] [place], [place] - [address] AT [facility] [address] [place] [address] Phone: [phone] Fax: [phone]     Social Drivers of Health (SDOH) Social History: SDOH Screenings   Food Insecurity: No Food Insecurity (11/18/2023)  Housing: Low Risk  (11/18/2023)  Transportation Needs: No Transportation Needs (11/18/2023)  Utilities: Not At Risk (11/18/2023)  Social Connections: Socially Integrated (11/18/2023)  Tobacco Use: Low Risk  (11/18/2023)   SDOH Interventions:     Readmission Risk Interventions    11/19/2023    2:48 PM  Readmission Risk Prevention Plan  Transportation Screening Complete  PCP or Specialist Appt within 5-7 Days Complete  Home Care Screening Complete  Medication Review (RN CM) Complete

## 2023-11-20 DIAGNOSIS — G9340 Encephalopathy, unspecified: Secondary | ICD-10-CM | POA: Diagnosis not present

## 2023-11-20 LAB — MAGNESIUM: Magnesium: 2.2 mg/dL (ref 1.7–2.4)

## 2023-11-20 LAB — COMPREHENSIVE METABOLIC PANEL
ALT: 24 U/L (ref 0–44)
AST: 38 U/L (ref 15–41)
Albumin: 3.5 g/dL (ref 3.5–5.0)
Alkaline Phosphatase: 75 U/L (ref 38–126)
Anion gap: 9 (ref 5–15)
BUN: 20 mg/dL (ref 8–23)
CO2: 29 mmol/L (ref 22–32)
Calcium: 8.1 mg/dL — ABNORMAL LOW (ref 8.9–10.3)
Chloride: 90 mmol/L — ABNORMAL LOW (ref 98–111)
Creatinine, Ser: 0.47 mg/dL (ref 0.44–1.00)
GFR, Estimated: >60 mL/min (ref >60)
Glucose, Bld: 137 mg/dL — ABNORMAL HIGH (ref 70–99)
Potassium: 3.9 mmol/L (ref 3.5–5.1)
Sodium: 128 mmol/L — ABNORMAL LOW (ref 135–145)
Total Bilirubin: 1.2 mg/dL (ref 0.0–1.2)
Total Protein: 8.6 g/dL — ABNORMAL HIGH (ref 6.5–8.1)

## 2023-11-20 LAB — CBC
HCT: 41.4 % (ref 36.0–46.0)
Hemoglobin: 13.6 g/dL (ref 12.0–15.0)
MCH: 31.9 pg (ref 26.0–34.0)
MCHC: 32.9 g/dL (ref 30.0–36.0)
MCV: 97.2 fL (ref 80.0–100.0)
Platelets: 226 10*3/uL (ref 150–400)
RBC: 4.26 MIL/uL (ref 3.87–5.11)
RDW: 13.1 % (ref 11.5–15.5)
WBC: 8.4 10*3/uL (ref 4.0–10.5)
nRBC: 0 % (ref 0.0–0.2)

## 2023-11-20 MED ORDER — ENSURE ENLIVE PO LIQD
237.0000 mL | Freq: Two times a day (BID) | ORAL | Status: DC
  Administered 2023-11-20 – 2023-11-23 (×7): 237 mL via ORAL

## 2023-11-20 MED ORDER — MIRTAZAPINE 15 MG PO TABS
7.5000 mg | ORAL_TABLET | Freq: Every day | ORAL | Status: DC
  Administered 2023-11-20 – 2023-11-21 (×2): 7.5 mg via ORAL
  Filled 2023-11-20 (×2): qty 1

## 2023-11-20 NOTE — Progress Notes (Signed)
 Mobility Specialist - Progress Note   11/20/23 1358  Mobility  Activity Dangled on edge of bed  Activity Response Tolerated well  Mobility Referral Yes  Mobility visit 1 Mobility  Mobility Specialist Start Time (ACUTE ONLY) 1347  Mobility Specialist Stop Time (ACUTE ONLY) 1357  Mobility Specialist Time Calculation (min) (ACUTE ONLY) 10 min   Pt received in bed and agreeable to mobility. Once sitting up pt c/o feeling SOB. SpO2 found to be 75%. RN notified and placed [place]. SpO2 up to 94%. No other complaints during session. Pt to bed after session with all needs met. RN in room.   [facility]

## 2023-11-20 NOTE — Progress Notes (Signed)
 PROGRESS NOTE  [name] ZOX:[id number] DOB: 05-15-1936 DOA: 11/18/2023 PCP: Adrian Prince, MD   LOS: 1 day   Brief Narrative / Interim history: 88 year old female with history of CAD, PAF on Eliquis, hypothyroidism, chronic hyponatremia, recent admission for influenza A comes in back to the hospital for poor p.o. intake, increased confusion, increased shortness of breath and low sodium on outpatient blood work.  Patient and her husband had COVID late last year, and here she has been having very poor appetite since.  She reports worsening shortness of breath, but does admit that this has been going on for at least several years.  No abdominal discomfort, no nausea or vomiting, no fever or chills.  Husband also reports occasional jerking movements in her upper extremities  Subjective / 24h Interval events: Leaning forward in the chair, moaning, but denies any pain or discomfort  Assesement and Plan: Principal Problem:   Acute encephalopathy Active Problems:   PAF (paroxysmal atrial fibrillation) (HCC)   History of pulmonary embolism   Essential hypertension, benign   Hypothyroidism   CAD- severe LAD and RCA disease with collaterals from CFX Sept 2009. Medical Rx   Hyponatremia   Chronic heart failure with preserved ejection fraction (HFpEF) (HCC)  Principal problem Acute on chronic hyponatremia -this has been an issue for several years, going back as far as 2015.  Her sodium can range as an outpatient from upper 120s to low 130s. -She does appear intravascularly depleted, so her hyponatremia is multifactorial in the setting of poor solute intake, also contributing is her home furosemide given renal salt loss with a high urine sodium -TSH unremarkable at 0.39, cortisol normal as well -Received limited IV fluids x 2 days, hydration status looks better, hypochloremia and sodium improving -Continue salt tabs, along with fluid restriction.  Hold furosemide  Active problems Chronic  diastolic CHF, elevated PA pressure -recent 2D echo showed normal LVEF, right heart cath done January 2025 showed elevated RV pressure and PA pressure.  Imaging also showed mild mitral valve regurgitation, mild to moderate tricuspid valve regurgitation and aortic stenosis -There is a degree of third spacing with elevated BNP and trace bilateral pleural effusions, as well as trace lower extremity edema, but she is intravascularly depleted.   Depression, failure to thrive -patient does report worsening depression, hopelessness and has been losing significant amount of weight in the setting of poor appetite following her COVID infection last year.  She is open to trying medications to help, start Remeron  Increased confusion-MRI of the brain without acute findings.  Mental status slightly better today  Hypothyroidism-continue Synthroid, TSH as above  PAF-continue metoprolol, Eliquis for anticoagulation  Hyperlipidemia-continue atorvastatin  CAD-cath done end of January showed CAD, medical management recommended.  Continue Imdur, beta-blockers, statin  Scheduled Meds:  apixaban  2.5 mg Oral BID   atorvastatin  40 mg Oral QHS   isosorbide mononitrate  30 mg Oral q AM   levothyroxine  88 mcg Oral QAC breakfast   metoprolol succinate  100 mg Oral Daily   mirtazapine  7.5 mg Oral QHS   sodium chloride flush  3 mL Intravenous Q12H   sodium chloride  1 g Oral TID WC   Continuous Infusions:   PRN Meds:.acetaminophen **OR** acetaminophen, prochlorperazine, senna-docusate  Current Outpatient Medications  Medication Instructions   acetaminophen (TYLENOL) 500-1,000 mg, See admin instructions   amLODipine (NORVASC) 5 mg, Oral, Daily at bedtime   atorvastatin (LIPITOR) 40 mg, Oral, Daily,     calcium carbonate (  TUMS - DOSED IN MG ELEMENTAL CALCIUM) 500 MG chewable tablet 1 tablet, Oral, Daily PRN   Calcium Carbonate-Vitamin D (CALCIUM + D PO) 1 tablet, Oral, See admin instructions, Take 1 tablet  by mouth with lunch and supper   ELIQUIS 2.5 MG TABS tablet TAKE 1 TABLET(2.5 MG) BY MOUTH TWICE DAILY   furosemide (LASIX) 20 MG tablet TAKE 1 TABLET(20 MG) BY MOUTH DAILY   isosorbide mononitrate (IMDUR) 30 mg, See admin instructions   levofloxacin (LEVAQUIN) 500 mg, Oral, Daily   levothyroxine (SYNTHROID) 88 mcg, Daily before breakfast   losartan (COZAAR) 50 mg, Daily with breakfast   metoprolol succinate (TOPROL-XL) 100 mg, Oral, Daily, Take with or immediately following a meal.   Multiple Vitamins-Minerals (CENTRUM SILVER 50+WOMEN) TABS 1 tablet, Daily with lunch   Multiple Vitamins-Minerals (PRESERVISION AREDS) CAPS 1 capsule, See admin instructions   nitroGLYCERIN (NITROSTAT) 0.4 MG SL tablet For chest pain, tightness, or pressure. While sitting, place 1 tablet under tongue. May be used every 5 minutes as needed, for up to 15 minutes. Do not use more than 3 tablets.   Olopatadine HCl (PATADAY OP) 1 drop, Both Eyes, Every morning    Diet Orders (From admission, onward)     Start     Ordered   11/18/23 1644  Diet regular Fluid consistency: Thin; Fluid restriction: 1500 mL Fluid  Diet effective now       Question Answer Comment  Fluid consistency: Thin   Fluid restriction: 1500 mL Fluid      11/18/23 1643            DVT prophylaxis: apixaban (ELIQUIS) tablet 2.5 mg Start: 11/18/23 2200 apixaban (ELIQUIS) tablet 2.5 mg   Lab Results  Component Value Date   PLT 226 11/20/2023      Code Status: Limited: Do not attempt resuscitation (DNR) -DNR-LIMITED -Do Not Intubate/DNI   Family Communication: Husband at bedside  Status is: Inpatient  Level of care: Telemetry  Consultants:  None  Objective: Vitals:   11/19/23 1026 11/19/23 1314 11/19/23 2008 11/20/23 0520  BP: 138/83 134/79 (!) 152/89 (!) 159/80  Pulse: 83 92 99 (!) 103  Resp:   18 18  Temp:  97.9 F (36.6 C) 98.2 F (36.8 C) 98.5 F (36.9 C)  TempSrc:  Oral    SpO2:  90% 90% 91%    Intake/Output  Summary (Last 24 hours) at 11/20/2023 1026 Last data filed at 11/20/2023 0500 Gross per 24 hour  Intake 935.27 ml  Output 300 ml  Net 635.27 ml   Wt Readings from Last 3 Encounters:  11/12/23 51.3 kg  11/09/23 51.4 kg  11/04/23 52.6 kg    Examination:  Constitutional: NAD Eyes: lids and conjunctivae normal, no scleral icterus ENMT: mmm Neck: normal, supple Respiratory: clear to auscultation bilaterally, no wheezing, no crackles. Cardiovascular: Irregular, 3/6 SEM Abdomen: soft, no distention, no tenderness. Bowel sounds positive.  Skin: no rashes Neurologic: no focal deficits, equal strength  Data Reviewed: I have independently reviewed following labs and imaging studies   CBC Recent Labs  Lab 11/18/23 1414 11/19/23 0536 11/20/23 0712  WBC 7.2 8.2 8.4  HGB 14.2 13.7 13.6  HCT 43.7 41.7 41.4  PLT 226 203 226  MCV 96.3 96.8 97.2  MCH 31.3 31.8 31.9  MCHC 32.5 32.9 32.9  RDW 13.0 12.8 13.1    Recent Labs  Lab 11/18/23 1414 11/18/23 1436 11/18/23 1740 11/19/23 0536 11/20/23 0712  NA 125*  --   --  126* 128*  K 4.7  --   --  4.4 3.9  CL 82*  --   --  85* 90*  CO2 30  --   --  28 29  GLUCOSE 113*  --   --  110* 137*  BUN 24*  --   --  20 20  CREATININE 0.35*  --   --  0.56 0.47  CALCIUM 8.4*  --   --  8.1* 8.1*  AST 42*  --   --  39 38  ALT 20  --   --  22 24  ALKPHOS 69  --   --  71 75  BILITOT 1.5*  --   --  1.5* 1.2  ALBUMIN 3.7  --   --  3.6 3.5  MG  --   --   --   --  2.2  LATICACIDVEN  --  1.5  --   --   --   AMMONIA  --   --  <10  --   --   BNP  --   --   --  663.1*  --     ------------------------------------------------------------------------------------------------------------------ No results for input(s): "CHOL", "HDL", "LDLCALC", "TRIG", "CHOLHDL", "LDLDIRECT" in the last 72 hours.  Lab Results  Component Value Date   HGBA1C 6.3 (H) 08/06/2021    ------------------------------------------------------------------------------------------------------------------ No results for input(s): "TSH", "T4TOTAL", "T3FREE", "THYROIDAB" in the last 72 hours.  Invalid input(s): "FREET3"  Cardiac Enzymes No results for input(s): "CKMB", "TROPONINI", "MYOGLOBIN" in the last 168 hours.  Invalid input(s): "CK" ------------------------------------------------------------------------------------------------------------------    Component Value Date/Time   BNP 663.1 (H) 11/19/2023 0536    CBG: Recent Labs  Lab 11/18/23 1334  GLUCAP 106*    Recent Results (from the past 240 hours)  Resp panel by RT-PCR (RSV, Flu A&B, Covid) Anterior Nasal Swab     Status: Abnormal   Collection Time: 11/12/23  9:30 AM   Specimen: Anterior Nasal Swab  Result Value Ref Range Status   SARS Coronavirus 2 by RT PCR NEGATIVE NEGATIVE Final   Influenza A by PCR POSITIVE (A) NEGATIVE Final   Influenza B by PCR NEGATIVE NEGATIVE Final    Comment: (NOTE) The Xpert Xpress SARS-CoV-2/FLU/RSV plus assay is intended as an aid in the diagnosis of influenza from Nasopharyngeal swab specimens and should not be used as a sole basis for treatment. Nasal washings and aspirates are unacceptable for Xpert Xpress SARS-CoV-2/FLU/RSV testing.  Fact Sheet for Patients: BloggerCourse.com  Fact Sheet for Healthcare Providers: SeriousBroker.it  This test is not yet approved or cleared by the [place] FDA and has been authorized for detection and/or diagnosis of SARS-CoV-2 by FDA under an Emergency Use Authorization (EUA). This EUA will remain in effect (meaning this test can be used) for the duration of the COVID-19 declaration under Section 564(b)(1) of the Act, 21 U.S.C. section 360bbb-3(b)(1), unless the authorization is terminated or revoked.     Resp Syncytial Virus by PCR NEGATIVE NEGATIVE Final    Comment:  (NOTE) Fact Sheet for Patients: BloggerCourse.com  Fact Sheet for Healthcare Providers: SeriousBroker.it  This test is not yet approved or cleared by the [place] FDA and has been authorized for detection and/or diagnosis of SARS-CoV-2 by FDA under an Emergency Use Authorization (EUA). This EUA will remain in effect (meaning this test can be used) for the duration of the COVID-19 declaration under Section 564(b)(1) of the Act, 21 U.S.C. section 360bbb-3(b)(1), unless the authorization is terminated or revoked.  Performed at [facility]  Lab, [address] [place], [place] [address]   Urine Culture     Status: Abnormal   Collection Time: 11/12/23 11:16 AM   Specimen: Urine, Clean Catch  Result Value Ref Range Status   Specimen Description URINE, CLEAN CATCH  Final   Special Requests NONE Reflexed from [id number]  Final   Culture (A)  Final    >=100,000 COLONIES/mL DIPHTHEROIDS(CORYNEBACTERIUM SPECIES) Standardized susceptibility testing for this organism is not available. Performed at [facility], [address] [place], [place] [address]    Report Status 11/14/2023 FINAL  Final  Blood culture (routine x 2)     Status: None (Preliminary result)   Collection Time: 11/18/23  2:46 PM   Specimen: BLOOD RIGHT ARM  Result Value Ref Range Status   Specimen Description   Final    BLOOD RIGHT ARM Performed at [facility], [address], [place], [place] [address]    Special Requests   Final    BOTTLES DRAWN AEROBIC AND ANAEROBIC Blood Culture results may not be optimal due to an inadequate volume of blood received in culture bottles Performed at [facility], [address] [place], [place] [address]    Culture   Final    NO GROWTH 2 DAYS Performed at [facility], [address] [place], [place] [address]    Report Status PENDING  Incomplete  Blood culture (routine x 2)     Status:  None (Preliminary result)   Collection Time: 11/18/23  2:47 PM   Specimen: BLOOD LEFT ARM  Result Value Ref Range Status   Specimen Description   Final    BLOOD LEFT ARM Performed at [facility], [address], [place], [place] [address]    Special Requests   Final    BOTTLES DRAWN AEROBIC AND ANAEROBIC Blood Culture results may not be optimal due to an inadequate volume of blood received in culture bottles Performed at [facility], [address], [place], [place] [address]    Culture   Final    NO GROWTH 2 DAYS Performed at [facility], [address], [place], [place] [address]    Report Status PENDING  Incomplete     Radiology Studies: MR BRAIN WO CONTRAST Result Date: 11/19/2023 CLINICAL DATA:  Mental status change, unknown cause EXAM: MRI HEAD WITHOUT CONTRAST TECHNIQUE: Multiplanar, multiecho pulse sequences of the brain and surrounding structures were obtained without intravenous contrast. COMPARISON:  CT head November 05, 2013. FINDINGS: Brain: No acute infarction, hemorrhage, hydrocephalus, extra-axial collection or mass lesion. Mild to moderate for age T2/FLAIR hyperintensity white matter, nonspecific but compatible with chronic microvascular disease. Vascular: Major arterial flow voids are maintained at the skull base. Skull and upper cervical spine: Normal marrow signal. Sinuses/Orbits: Clear sinuses.  No acute orbital findings. Other: No mastoid effusions. IMPRESSION: No evidence of acute intracranial abnormality. Electronically Signed   By: Feliberto Harts M.D.   On: 11/19/2023 21:46     Pamella Pert, MD, PhD [facility]  Between 7 am - 7 pm I am available, please contact me via Amion (for emergencies) or Securechat (non urgent messages)  Between 7 pm - 7 am I am not available, please contact night coverage MD/APP via Amion

## 2023-11-20 NOTE — Plan of Care (Signed)
  Problem: Education: Goal: Knowledge of General Education information will improve Description: Including pain rating scale, medication(s)/side effects and non-pharmacologic comfort measures Outcome: Progressing   Problem: Clinical Measurements: Goal: Ability to maintain clinical measurements within normal limits will improve Outcome: Progressing Goal: Will remain free from infection Outcome: Progressing   Problem: Pain Managment: Goal: General experience of comfort will improve and/or be controlled Outcome: Progressing   Problem: Safety: Goal: Ability to remain free from injury will improve Outcome: Progressing   Problem: Skin Integrity: Goal: Risk for impaired skin integrity will decrease Outcome: Progressing

## 2023-11-20 NOTE — Plan of Care (Signed)

## 2023-11-21 DIAGNOSIS — G9340 Encephalopathy, unspecified: Secondary | ICD-10-CM | POA: Diagnosis not present

## 2023-11-21 LAB — COMPREHENSIVE METABOLIC PANEL
ALT: 21 U/L (ref 0–44)
AST: 33 U/L (ref 15–41)
Albumin: 3 g/dL — ABNORMAL LOW (ref 3.5–5.0)
Alkaline Phosphatase: 67 U/L (ref 38–126)
Anion gap: 6 (ref 5–15)
BUN: 21 mg/dL (ref 8–23)
CO2: 32 mmol/L (ref 22–32)
Calcium: 8 mg/dL — ABNORMAL LOW (ref 8.9–10.3)
Chloride: 95 mmol/L — ABNORMAL LOW (ref 98–111)
Creatinine, Ser: 0.54 mg/dL (ref 0.44–1.00)
GFR, Estimated: >60 mL/min (ref >60)
Glucose, Bld: 73 mg/dL (ref 70–99)
Potassium: 4.4 mmol/L (ref 3.5–5.1)
Sodium: 133 mmol/L — ABNORMAL LOW (ref 135–145)
Total Bilirubin: 0.9 mg/dL (ref 0.0–1.2)
Total Protein: 7.5 g/dL (ref 6.5–8.1)

## 2023-11-21 LAB — CBC
HCT: 42.5 % (ref 36.0–46.0)
Hemoglobin: 13.4 g/dL (ref 12.0–15.0)
MCH: 31.8 pg (ref 26.0–34.0)
MCHC: 31.5 g/dL (ref 30.0–36.0)
MCV: 101 fL — ABNORMAL HIGH (ref 80.0–100.0)
Platelets: 209 10*3/uL (ref 150–400)
RBC: 4.21 MIL/uL (ref 3.87–5.11)
RDW: 13.3 % (ref 11.5–15.5)
WBC: 8.9 10*3/uL (ref 4.0–10.5)
nRBC: 0 % (ref 0.0–0.2)

## 2023-11-21 LAB — MAGNESIUM: Magnesium: 2.3 mg/dL (ref 1.7–2.4)

## 2023-11-21 MED ORDER — FUROSEMIDE 20 MG PO TABS
10.0000 mg | ORAL_TABLET | Freq: Every day | ORAL | Status: DC
  Administered 2023-11-21: 10 mg via ORAL
  Filled 2023-11-21: qty 1

## 2023-11-21 NOTE — Plan of Care (Signed)
  Problem: Education: Goal: Knowledge of General Education information will improve Description: Including pain rating scale, medication(s)/side effects and non-pharmacologic comfort measures Outcome: Progressing   Problem: Clinical Measurements: Goal: Will remain free from infection Outcome: Progressing Goal: Diagnostic test results will improve Outcome: Progressing Goal: Respiratory complications will improve Outcome: Progressing Goal: Cardiovascular complication will be avoided Outcome: Progressing   Problem: Activity: Goal: Risk for activity intolerance will decrease Outcome: Progressing   Problem: Nutrition: Goal: Adequate nutrition will be maintained Outcome: Progressing   Problem: Elimination: Goal: Will not experience complications related to bowel motility Outcome: Progressing Goal: Will not experience complications related to urinary retention Outcome: Progressing   Problem: Pain Managment: Goal: General experience of comfort will improve and/or be controlled Outcome: Progressing   Problem: Safety: Goal: Ability to remain free from injury will improve Outcome: Progressing   Problem: Skin Integrity: Goal: Risk for impaired skin integrity will decrease Outcome: Progressing

## 2023-11-21 NOTE — Progress Notes (Addendum)
 PROGRESS NOTE  [name] ZOX:[id number] DOB: 07/17/1936 DOA: 11/18/2023 PCP: Adrian Prince, MD   LOS: 2 days   Brief Narrative / Interim history: 88 year old female with history of CAD, PAF on Eliquis, hypothyroidism, chronic hyponatremia, recent admission for influenza A comes in back to the hospital for poor p.o. intake, increased confusion, increased shortness of breath and low sodium on outpatient blood work.  Patient and her husband had COVID late last year, and here she has been having very poor appetite since.  She reports worsening shortness of breath, but does admit that this has been going on for at least several years.  No abdominal discomfort, no nausea or vomiting, no fever or chills.  Husband also reports occasional jerking movements in her upper extremities  Subjective / 24h Interval events: Feeling a little bit better, tells me she slept better last night  Assesement and Plan: Principal Problem:   Acute encephalopathy Active Problems:   PAF (paroxysmal atrial fibrillation) (HCC)   History of pulmonary embolism   Essential hypertension, benign   Hypothyroidism   CAD- severe LAD and RCA disease with collaterals from CFX Sept 2009. Medical Rx   Hyponatremia   Chronic heart failure with preserved ejection fraction (HFpEF) (HCC)  Principal problem Acute on chronic hyponatremia -this has been an issue for several years, going back as far as 2015.  Her sodium can range as an outpatient from upper 120s to low 130s. -She does appear intravascularly depleted, so her hyponatremia is multifactorial in the setting of poor solute intake, also contributing is her home furosemide  Active problems Chronic diastolic CHF, elevated PA pressure -recent 2D echo showed normal LVEF, right heart cath done January 2025 showed elevated RV pressure and PA pressure.  Imaging also showed mild mitral valve regurgitation, mild to moderate tricuspid valve regurgitation and aortic stenosis -There  is a degree of third spacing with elevated BNP and trace bilateral pleural effusions, as well as trace lower extremity edema -Resume low-dose furosemide today  Depression, failure to thrive -patient does report worsening depression, hopelessness and has been losing significant amount of weight in the setting of poor appetite following her COVID infection last year. -Continue Remeron, slept better last night  Increased confusion-MRI of the brain without acute findings.  Mental status slightly better today  Hypothyroidism-continue Synthroid, TSH as above  PAF-continue metoprolol, Eliquis for anticoagulation  Hyperlipidemia-continue atorvastatin  CAD-cath done end of January showed CAD, medical management recommended.  Continue Imdur, beta-blockers, statin  Scheduled Meds:  apixaban  2.5 mg Oral BID   atorvastatin  40 mg Oral QHS   feeding supplement  237 mL Oral BID BM   furosemide  10 mg Oral Daily   isosorbide mononitrate  30 mg Oral q AM   levothyroxine  88 mcg Oral QAC breakfast   metoprolol succinate  100 mg Oral Daily   mirtazapine  7.5 mg Oral QHS   sodium chloride flush  3 mL Intravenous Q12H   sodium chloride  1 g Oral TID WC   Continuous Infusions:   PRN Meds:.acetaminophen **OR** acetaminophen, prochlorperazine, senna-docusate  Current Outpatient Medications  Medication Instructions   acetaminophen (TYLENOL) 500-1,000 mg, See admin instructions   amLODipine (NORVASC) 5 mg, Oral, Daily at bedtime   atorvastatin (LIPITOR) 40 mg, Oral, Daily,     calcium carbonate (TUMS - DOSED IN MG ELEMENTAL CALCIUM) 500 MG chewable tablet 1 tablet, Oral, Daily PRN   Calcium Carbonate-Vitamin D (CALCIUM + D PO) 1 tablet, Oral, See admin instructions,  Take 1 tablet by mouth with lunch and supper   ELIQUIS 2.5 MG TABS tablet TAKE 1 TABLET(2.5 MG) BY MOUTH TWICE DAILY   furosemide (LASIX) 20 MG tablet TAKE 1 TABLET(20 MG) BY MOUTH DAILY   isosorbide mononitrate (IMDUR) 30 mg, See admin  instructions   levofloxacin (LEVAQUIN) 500 mg, Oral, Daily   levothyroxine (SYNTHROID) 88 mcg, Daily before breakfast   losartan (COZAAR) 50 mg, Daily with breakfast   metoprolol succinate (TOPROL-XL) 100 mg, Oral, Daily, Take with or immediately following a meal.   Multiple Vitamins-Minerals (CENTRUM SILVER 50+WOMEN) TABS 1 tablet, Daily with lunch   Multiple Vitamins-Minerals (PRESERVISION AREDS) CAPS 1 capsule, See admin instructions   nitroGLYCERIN (NITROSTAT) 0.4 MG SL tablet For chest pain, tightness, or pressure. While sitting, place 1 tablet under tongue. May be used every 5 minutes as needed, for up to 15 minutes. Do not use more than 3 tablets.   Olopatadine HCl (PATADAY OP) 1 drop, Both Eyes, Every morning    Diet Orders (From admission, onward)     Start     Ordered   11/18/23 1644  Diet regular Fluid consistency: Thin; Fluid restriction: 1500 mL Fluid  Diet effective now       Question Answer Comment  Fluid consistency: Thin   Fluid restriction: 1500 mL Fluid      11/18/23 1643            DVT prophylaxis: apixaban (ELIQUIS) tablet 2.5 mg Start: 11/18/23 2200 apixaban (ELIQUIS) tablet 2.5 mg   Lab Results  Component Value Date   PLT 209 11/21/2023      Code Status: Limited: Do not attempt resuscitation (DNR) -DNR-LIMITED -Do Not Intubate/DNI   Family Communication: Husband and daughter at bedside  Status is: Inpatient  Level of care: Telemetry  Consultants:  None  Objective: Vitals:   11/20/23 1300 11/20/23 2037 11/21/23 0448 11/21/23 0500  BP: 125/79 130/81 134/74   Pulse: 69 92 96   Resp: 20 20 20    Temp: 97.9 F (36.6 C) 98.3 F (36.8 C) 98.3 F (36.8 C)   TempSrc: Oral Oral Oral   SpO2: 92% 100% 100%   Weight:    49.9 kg    Intake/Output Summary (Last 24 hours) at 11/21/2023 1210 Last data filed at 11/21/2023 0850 Gross per 24 hour  Intake 120 ml  Output 500 ml  Net -380 ml   Wt Readings from Last 3 Encounters:  11/21/23 49.9 kg   11/12/23 51.3 kg  11/09/23 51.4 kg    Examination:  Constitutional: NAD Eyes: lids and conjunctivae normal, no scleral icterus ENMT: mmm Neck: normal, supple Respiratory: Faint crackles bibasilarly, no wheezing Cardiovascular: Regular rate and rhythm, no murmurs / rubs / gallops. No LE edema. Abdomen: soft, no distention, no tenderness. Bowel sounds positive.   Data Reviewed: I have independently reviewed following labs and imaging studies   CBC Recent Labs  Lab 11/18/23 1414 11/19/23 0536 11/20/23 0712 11/21/23 0625  WBC 7.2 8.2 8.4 8.9  HGB 14.2 13.7 13.6 13.4  HCT 43.7 41.7 41.4 42.5  PLT 226 203 226 209  MCV 96.3 96.8 97.2 101.0*  MCH 31.3 31.8 31.9 31.8  MCHC 32.5 32.9 32.9 31.5  RDW 13.0 12.8 13.1 13.3    Recent Labs  Lab 11/18/23 1414 11/18/23 1436 11/18/23 1740 11/19/23 0536 11/20/23 0712 11/21/23 0625  NA 125*  --   --  126* 128* 133*  K 4.7  --   --  4.4 3.9 4.4  CL  82*  --   --  85* 90* 95*  CO2 30  --   --  28 29 32  GLUCOSE 113*  --   --  110* 137* 73  BUN 24*  --   --  20 20 21   CREATININE 0.35*  --   --  0.56 0.47 0.54  CALCIUM 8.4*  --   --  8.1* 8.1* 8.0*  AST 42*  --   --  39 38 33  ALT 20  --   --  22 24 21   ALKPHOS 69  --   --  71 75 67  BILITOT 1.5*  --   --  1.5* 1.2 0.9  ALBUMIN 3.7  --   --  3.6 3.5 3.0*  MG  --   --   --   --  2.2 2.3  LATICACIDVEN  --  1.5  --   --   --   --   AMMONIA  --   --  <10  --   --   --   BNP  --   --   --  663.1*  --   --     ------------------------------------------------------------------------------------------------------------------ No results for input(s): "CHOL", "HDL", "LDLCALC", "TRIG", "CHOLHDL", "LDLDIRECT" in the last 72 hours.  Lab Results  Component Value Date   HGBA1C 6.3 (H) 08/06/2021   ------------------------------------------------------------------------------------------------------------------ No results for input(s): "TSH", "T4TOTAL", "T3FREE", "THYROIDAB" in the last 72  hours.  Invalid input(s): "FREET3"  Cardiac Enzymes No results for input(s): "CKMB", "TROPONINI", "MYOGLOBIN" in the last 168 hours.  Invalid input(s): "CK" ------------------------------------------------------------------------------------------------------------------    Component Value Date/Time   BNP 663.1 (H) 11/19/2023 0536    CBG: Recent Labs  Lab 11/18/23 1334  GLUCAP 106*    Recent Results (from the past 240 hours)  Resp panel by RT-PCR (RSV, Flu A&B, Covid) Anterior Nasal Swab     Status: Abnormal   Collection Time: 11/12/23  9:30 AM   Specimen: Anterior Nasal Swab  Result Value Ref Range Status   SARS Coronavirus 2 by RT PCR NEGATIVE NEGATIVE Final   Influenza A by PCR POSITIVE (A) NEGATIVE Final   Influenza B by PCR NEGATIVE NEGATIVE Final    Comment: (NOTE) The Xpert Xpress SARS-CoV-2/FLU/RSV plus assay is intended as an aid in the diagnosis of influenza from Nasopharyngeal swab specimens and should not be used as a sole basis for treatment. Nasal washings and aspirates are unacceptable for Xpert Xpress SARS-CoV-2/FLU/RSV testing.  Fact Sheet for Patients: BloggerCourse.com  Fact Sheet for Healthcare Providers: SeriousBroker.it  This test is not yet approved or cleared by the [place] FDA and has been authorized for detection and/or diagnosis of SARS-CoV-2 by FDA under an Emergency Use Authorization (EUA). This EUA will remain in effect (meaning this test can be used) for the duration of the COVID-19 declaration under Section 564(b)(1) of the Act, 21 U.S.C. section 360bbb-3(b)(1), unless the authorization is terminated or revoked.     Resp Syncytial Virus by PCR NEGATIVE NEGATIVE Final    Comment: (NOTE) Fact Sheet for Patients: BloggerCourse.com  Fact Sheet for Healthcare Providers: SeriousBroker.it  This test is not yet approved or cleared  by the [place] FDA and has been authorized for detection and/or diagnosis of SARS-CoV-2 by FDA under an Emergency Use Authorization (EUA). This EUA will remain in effect (meaning this test can be used) for the duration of the COVID-19 declaration under Section 564(b)(1) of the Act, 21 U.S.C. section 360bbb-3(b)(1), unless the authorization is  terminated or revoked.  Performed at [facility], [address], [place], [place] [address]   Urine Culture     Status: Abnormal   Collection Time: 11/12/23 11:16 AM   Specimen: Urine, Clean Catch  Result Value Ref Range Status   Specimen Description URINE, CLEAN CATCH  Final   Special Requests NONE Reflexed from [id number]  Final   Culture (A)  Final    >=100,000 COLONIES/mL DIPHTHEROIDS(CORYNEBACTERIUM SPECIES) Standardized susceptibility testing for this organism is not available. Performed at [facility], [address] [place], [place] [address]    Report Status 11/14/2023 FINAL  Final  Blood culture (routine x 2)     Status: None (Preliminary result)   Collection Time: 11/18/23  2:46 PM   Specimen: BLOOD RIGHT ARM  Result Value Ref Range Status   Specimen Description   Final    BLOOD RIGHT ARM Performed at [facility], [address], [place], [place] [address]    Special Requests   Final    BOTTLES DRAWN AEROBIC AND ANAEROBIC Blood Culture results may not be optimal due to an inadequate volume of blood received in culture bottles Performed at [facility], [address], [place], [place] [address]    Culture   Final    NO GROWTH 3 DAYS Performed at [facility], [address] [place], [place] [address]    Report Status PENDING  Incomplete  Blood culture (routine x 2)     Status: None (Preliminary result)   Collection Time: 11/18/23  2:47 PM   Specimen: BLOOD LEFT ARM  Result Value Ref Range Status   Specimen Description   Final    BLOOD LEFT ARM Performed at [facility], [address] [place], [place] [address]    Special Requests   Final    BOTTLES DRAWN AEROBIC AND ANAEROBIC Blood Culture results may not be optimal due to an inadequate volume of blood received in culture bottles Performed at [facility], [address], [place], [place] [address]    Culture   Final    NO GROWTH 3 DAYS Performed at [facility], [address], [place], [place] [address]    Report Status PENDING  Incomplete     Radiology Studies: No results found.    Pamella Pert, MD, PhD [facility]  Between 7 am - 7 pm I am available, please contact me via Amion (for emergencies) or Securechat (non urgent messages)  Between 7 pm - 7 am I am not available, please contact night coverage MD/APP via Amion

## 2023-11-21 NOTE — Plan of Care (Signed)
  Problem: Education: Goal: Knowledge of General Education information will improve Description: Including pain rating scale, medication(s)/side effects and non-pharmacologic comfort measures Outcome: Progressing   Problem: Clinical Measurements: Goal: Ability to maintain clinical measurements within normal limits will improve Outcome: Progressing Goal: Will remain free from infection Outcome: Progressing Goal: Respiratory complications will improve Outcome: Progressing   Problem: Pain Managment: Goal: General experience of comfort will improve and/or be controlled Outcome: Progressing   Problem: Safety: Goal: Ability to remain free from injury will improve Outcome: Progressing

## 2023-11-21 NOTE — Progress Notes (Signed)
 Mobility Specialist - Progress Note   11/21/23 1237  Oxygen Therapy  SpO2 (!) 84 %  O2 Device Nasal Cannula  O2 Flow Rate (L/min) 2 L/min  Patient Activity (if Appropriate) Ambulating  Mobility  Activity Ambulated with assistance in hallway  Level of Assistance Contact guard assist, steadying assist  Assistive Device Front wheel walker  Distance Ambulated (ft) 40 ft  Activity Response Tolerated well  Mobility Referral Yes  Mobility visit 1 Mobility  Mobility Specialist Start Time (ACUTE ONLY) 1220  Mobility Specialist Stop Time (ACUTE ONLY) 1235  Mobility Specialist Time Calculation (min) (ACUTE ONLY) 15 min   Pt received in recliner and agreeable to mobility. Distance limited d/t SpO2 desaturation to 84%. Encouraged pursed lip breaths bringing SpO2 to 93%. Pt endorsed feeling weak throughout session. Pt to recliner after session with all needs met.    Pre-mobility: 97 HR, 94% SpO2 (2L [employer]) During mobility: 135 HR, 84% SpO2 (2L [place]) Post-mobility: 101 HR, 93% SPO2 (2L [place])  Chief Technology Officer

## 2023-11-22 DIAGNOSIS — G9340 Encephalopathy, unspecified: Secondary | ICD-10-CM | POA: Diagnosis not present

## 2023-11-22 LAB — BASIC METABOLIC PANEL
Anion gap: 8 (ref 5–15)
BUN: 19 mg/dL (ref 8–23)
CO2: 33 mmol/L — ABNORMAL HIGH (ref 22–32)
Calcium: 7.9 mg/dL — ABNORMAL LOW (ref 8.9–10.3)
Chloride: 92 mmol/L — ABNORMAL LOW (ref 98–111)
Creatinine, Ser: 0.44 mg/dL (ref 0.44–1.00)
GFR, Estimated: >60 mL/min (ref >60)
Glucose, Bld: 107 mg/dL — ABNORMAL HIGH (ref 70–99)
Potassium: 4.5 mmol/L (ref 3.5–5.1)
Sodium: 133 mmol/L — ABNORMAL LOW (ref 135–145)

## 2023-11-22 LAB — CBC
HCT: 43.1 % (ref 36.0–46.0)
Hemoglobin: 13.5 g/dL (ref 12.0–15.0)
MCH: 31.3 pg (ref 26.0–34.0)
MCHC: 31.3 g/dL (ref 30.0–36.0)
MCV: 100 fL (ref 80.0–100.0)
Platelets: 221 10*3/uL (ref 150–400)
RBC: 4.31 MIL/uL (ref 3.87–5.11)
RDW: 13.2 % (ref 11.5–15.5)
WBC: 7.5 10*3/uL (ref 4.0–10.5)
nRBC: 0 % (ref 0.0–0.2)

## 2023-11-22 MED ORDER — FUROSEMIDE 20 MG PO TABS
20.0000 mg | ORAL_TABLET | Freq: Every day | ORAL | Status: DC
  Administered 2023-11-22 – 2023-11-23 (×2): 20 mg via ORAL
  Filled 2023-11-22 (×2): qty 1

## 2023-11-22 MED ORDER — ALBUTEROL SULFATE (2.5 MG/3ML) 0.083% IN NEBU
2.5000 mg | INHALATION_SOLUTION | RESPIRATORY_TRACT | Status: DC | PRN
  Administered 2023-11-22 – 2023-11-23 (×2): 2.5 mg via RESPIRATORY_TRACT
  Filled 2023-11-22 (×2): qty 3

## 2023-11-22 MED ORDER — MIRTAZAPINE 15 MG PO TABS
15.0000 mg | ORAL_TABLET | Freq: Every day | ORAL | Status: DC
  Administered 2023-11-22: 15 mg via ORAL
  Filled 2023-11-22: qty 1

## 2023-11-22 NOTE — Plan of Care (Signed)
  Problem: Education: Goal: Knowledge of General Education information will improve Description: Including pain rating scale, medication(s)/side effects and non-pharmacologic comfort measures Outcome: Progressing   Problem: Activity: Goal: Risk for activity intolerance will decrease Outcome: Progressing   Problem: Coping: Goal: Level of anxiety will decrease Outcome: Progressing   Problem: Pain Managment: Goal: General experience of comfort will improve and/or be controlled Outcome: Progressing   Problem: Safety: Goal: Ability to remain free from injury will improve Outcome: Progressing   Problem: Skin Integrity: Goal: Risk for impaired skin integrity will decrease Outcome: Progressing   Problem: Nutrition: Goal: Adequate nutrition will be maintained Outcome: Not Progressing

## 2023-11-22 NOTE — Progress Notes (Signed)
 Physical Therapy Treatment Patient Details Name: [name] MRN: [id number] DOB: [birth year] Today's Date: 11/22/2023   History of Present Illness Pt is an 88 year old female admitted 11/18/23 for acute encephalopathy and acute on chronic hyponatremia. Pt with recent admission for influenza A.  PMHx: PAF on Eliquis, CAD, HTN, osteoporosis, carotid stenosis, RA, CHF    PT Comments  PT - Cognition Comments: Pt AxO x1.5 pleasant Lady but appear fragile, deconditioned, weary and MAX fatigue.  Very supportive Spouse at bedside.  Attentive.  Reports, pt has had poor po intake and  weight loss.  Both reside at [facility] but he cares for her.  Spouse also reports pt having memory loss.  Pt was OOB in recliner.  Assisted with amb.  General transfer comment: pt required assist to rise due to weakness and VC's on proper hand placement to self assist.  Also, asissted with a toilet transfer.  Unsteadu with static standing as well as back steps to toilet.  Fatigues quickly.  Requires freq rest breaks.  HIGH FALL RISK. General Gait Details: very limited amb distance due to MAX fatigue/weakness.  Remained on 2lts oxygen sats decreased to 88%.  Dyspnea 3/4.  Very deconditioned. Poor forward flex cevical posture close to 90 degrees head downward.  Requiring assist for direction as pt was running into doorways/walls/furniture.  Pt plans to D/C back to [facility] with Spouse but currently requires much care due to her weakness and memory issues.  Going to be a lot of burden on Spouse. LPT has rec HH PT.     If plan is discharge home, recommend the following: A little help with walking and/or transfers;A little help with bathing/dressing/bathroom;Help with stairs or ramp for entrance;Supervision due to cognitive status   Can travel by private vehicle        Equipment Recommendations  None recommended by PT    Recommendations for Other Services       Precautions / Restrictions  Precautions Precautions: Fall Precaution/Restrictions Comments: monitor sats Restrictions Weight Bearing Restrictions Per Provider Order: No     Mobility  Bed Mobility               General bed mobility comments: OOB in recliner    Transfers Overall transfer level: Needs assistance Equipment used: Rolling walker (2 wheels) Transfers: Sit to/from Stand Sit to Stand: Contact guard assist, Min assist           General transfer comment: pt required assist to rise due to weakness and VC's on proper hand placement to self assist.  Also, asissted with a toilet transfer.  Unsteadu with static standing as well as back steps to toilet.  Fatigues quickly.  Requires freq rest breaks.  HIGH FALL RISK.    Ambulation/Gait Ambulation/Gait assistance: Contact guard assist, Min assist Gait Distance (Feet): 18 Feet Assistive device: Rolling walker (2 wheels) Gait Pattern/deviations: Step-through pattern, Decreased stride length, Trunk flexed Gait velocity: decreased     General Gait Details: very limited amb distance due to MAX fatigue/weakness.  Remained on 2lts oxygen sats decreased to 88%.  Dyspnea 3/4.  Very deconditioned. Poor forward flex cevical posture close to 90 degrees head downward.  Requiring assist for direction as pt was running into doorways/walls/furniture.   Stairs             Wheelchair Mobility     Tilt Bed    Modified Rankin (Stroke Patients Only)       Balance  Communication    Cognition Arousal: Alert Behavior During Therapy: Flat affect   PT - Cognitive impairments: Attention, Memory, Problem solving, Safety/Judgement                       PT - Cognition Comments: Pt AxO x1.5 pleasant Lady but appear fragile, deconditioned, weary and MAX fatigue.  Very supportive Spouse at bedside.  Attentive.  Reports, pt has had poor po intake and  weight loss.  Both reside at [facility] but he cares for her.  Spouse also reports pt having memory loss.        Cueing    Exercises      General Comments        Pertinent Vitals/Pain Pain Assessment Pain Assessment: No/denies pain    Home Living                          Prior Function            PT Goals (current goals can now be found in the care plan section) Progress towards PT goals: Progressing toward goals    Frequency    Min 1X/week      PT Plan      Co-evaluation              AM-PAC PT "6 Clicks" Mobility   Outcome Measure  Help needed turning from your back to your side while in a flat bed without using bedrails?: A Lot Help needed moving from lying on your back to sitting on the side of a flat bed without using bedrails?: A Lot Help needed moving to and from a bed to a chair (including a wheelchair)?: A Lot Help needed standing up from a chair using your arms (e.g., wheelchair or bedside chair)?: A Lot Help needed to walk in hospital room?: A Lot Help needed climbing 3-5 steps with a railing? : A Lot 6 Click Score: 12    End of Session Equipment Utilized During Treatment: Gait belt Activity Tolerance: Patient limited by fatigue Patient left: in chair;with call bell/phone within reach;with family/visitor present Nurse Communication: Mobility status PT Visit Diagnosis: Difficulty in walking, not elsewhere classified (R26.2);Muscle weakness (generalized) (M62.81)     Time: 1610-9604 PT Time Calculation (min) (ACUTE ONLY): 27 min  Charges:    $Gait Training: 8-22 mins $Therapeutic Activity: 8-22 mins PT General Charges $$ ACUTE PT VISIT: 1 Visit                     Felecia Shelling  PTA Acute  Rehabilitation Services Office M-F          [phone]

## 2023-11-22 NOTE — Plan of Care (Signed)
  Problem: Education: Goal: Knowledge of General Education information will improve Description: Including pain rating scale, medication(s)/side effects and non-pharmacologic comfort measures Outcome: Progressing   Problem: Health Behavior/Discharge Planning: Goal: Ability to manage health-related needs will improve Outcome: Progressing   Problem: Clinical Measurements: Goal: Will remain free from infection Outcome: Progressing Goal: Diagnostic test results will improve Outcome: Progressing Goal: Respiratory complications will improve Outcome: Progressing Goal: Cardiovascular complication will be avoided Outcome: Progressing   Problem: Activity: Goal: Risk for activity intolerance will decrease Outcome: Progressing   Problem: Nutrition: Goal: Adequate nutrition will be maintained Outcome: Progressing   Problem: Coping: Goal: Level of anxiety will decrease Outcome: Progressing   Problem: Elimination: Goal: Will not experience complications related to bowel motility Outcome: Progressing   Problem: Safety: Goal: Ability to remain free from injury will improve Outcome: Progressing   Problem: Skin Integrity: Goal: Risk for impaired skin integrity will decrease Outcome: Progressing

## 2023-11-22 NOTE — Progress Notes (Signed)
 PROGRESS NOTE  DOROTHE ELMORE EAV:[id number] DOB: 05-31-1936 DOA: 11/18/2023 PCP: Adrian Prince, MD   LOS: 3 days   Brief Narrative / Interim history: 88 year old female with history of CAD, PAF on Eliquis, hypothyroidism, chronic hyponatremia, recent admission for influenza A comes in back to the hospital for poor p.o. intake, increased confusion, increased shortness of breath and low sodium on outpatient blood work.  Patient and her husband had COVID late last year, and here she has been having very poor appetite since.  She reports worsening shortness of breath, but does admit that this has been going on for at least several years.  No abdominal discomfort, no nausea or vomiting, no fever or chills.  Husband also reports occasional jerking movements in her upper extremities  Subjective / 24h Interval events: Reports better sleep.  Denies any shortness of breath remained this morning  Assesement and Plan: Principal Problem:   Acute encephalopathy Active Problems:   PAF (paroxysmal atrial fibrillation) (HCC)   History of pulmonary embolism   Essential hypertension, benign   Hypothyroidism   CAD- severe LAD and RCA disease with collaterals from CFX Sept 2009. Medical Rx   Hyponatremia   Chronic heart failure with preserved ejection fraction (HFpEF) (HCC)  Principal problem Acute on chronic hyponatremia -this has been an issue for several years, going back as far as 2015.  Her sodium can range as an outpatient from upper 120s to low 130s. -She does appear intravascularly depleted, so her hyponatremia is multifactorial in the setting of poor solute intake, also contributing is her home furosemide -With salt tabs, fluid restriction and initial limited isotonic IV fluids, her sodium has now improved and has remained stable at 133.  Resume home Lasix  Active problems Chronic diastolic CHF, elevated PA pressure -recent 2D echo showed normal LVEF, right heart cath done January 2025 showed  elevated RV pressure and PA pressure.  Imaging also showed mild mitral valve regurgitation, mild to moderate tricuspid valve regurgitation and aortic stenosis -There is a degree of third spacing with elevated BNP and trace bilateral pleural effusions, as well as trace lower extremity edema -Sinew Lasix, she seems to be tolerating it well with stable sodium  Depression, failure to thrive -patient does report worsening depression, hopelessness and has been losing significant amount of weight in the setting of poor appetite following her COVID infection last year. -Continue Remeron, finally sleeping a little bit better.  Increased confusion-MRI of the brain without acute findings.  Mental status improving  Hypothyroidism-continue Synthroid, TSH normal  Lab Results  Component Value Date   TSH 0.390 11/12/2023   PAF-continue metoprolol, Eliquis for anticoagulation  Hyperlipidemia-continue atorvastatin  CAD-cath done end of January showed CAD, medical management recommended.  Continue Imdur, beta-blockers, statin.  No chest pain  Scheduled Meds:  apixaban  2.5 mg Oral BID   atorvastatin  40 mg Oral QHS   feeding supplement  237 mL Oral BID BM   furosemide  20 mg Oral Daily   isosorbide mononitrate  30 mg Oral q AM   levothyroxine  88 mcg Oral QAC breakfast   metoprolol succinate  100 mg Oral Daily   mirtazapine  7.5 mg Oral QHS   sodium chloride flush  3 mL Intravenous Q12H   sodium chloride  1 g Oral TID WC   Continuous Infusions:  PRN Meds:.acetaminophen **OR** acetaminophen, albuterol, prochlorperazine, senna-docusate  Current Outpatient Medications  Medication Instructions   acetaminophen (TYLENOL) 500-1,000 mg, See admin instructions   amLODipine (NORVASC) 5  mg, Oral, Daily at bedtime   atorvastatin (LIPITOR) 40 mg, Oral, Daily,     calcium carbonate (TUMS - DOSED IN MG ELEMENTAL CALCIUM) 500 MG chewable tablet 1 tablet, Oral, Daily PRN   Calcium Carbonate-Vitamin D (CALCIUM  + D PO) 1 tablet, Oral, See admin instructions, Take 1 tablet by mouth with lunch and supper   ELIQUIS 2.5 MG TABS tablet TAKE 1 TABLET(2.5 MG) BY MOUTH TWICE DAILY   furosemide (LASIX) 20 MG tablet TAKE 1 TABLET(20 MG) BY MOUTH DAILY   isosorbide mononitrate (IMDUR) 30 mg, See admin instructions   levofloxacin (LEVAQUIN) 500 mg, Oral, Daily   levothyroxine (SYNTHROID) 88 mcg, Daily before breakfast   losartan (COZAAR) 50 mg, Daily with breakfast   metoprolol succinate (TOPROL-XL) 100 mg, Oral, Daily, Take with or immediately following a meal.   Multiple Vitamins-Minerals (CENTRUM SILVER 50+WOMEN) TABS 1 tablet, Daily with lunch   Multiple Vitamins-Minerals (PRESERVISION AREDS) CAPS 1 capsule, See admin instructions   nitroGLYCERIN (NITROSTAT) 0.4 MG SL tablet For chest pain, tightness, or pressure. While sitting, place 1 tablet under tongue. May be used every 5 minutes as needed, for up to 15 minutes. Do not use more than 3 tablets.   Olopatadine HCl (PATADAY OP) 1 drop, Both Eyes, Every morning    Diet Orders (From admission, onward)     Start     Ordered   11/18/23 1644  Diet regular Fluid consistency: Thin; Fluid restriction: 1500 mL Fluid  Diet effective now       Question Answer Comment  Fluid consistency: Thin   Fluid restriction: 1500 mL Fluid      11/18/23 1643            DVT prophylaxis: apixaban (ELIQUIS) tablet 2.5 mg Start: 11/18/23 2200 apixaban (ELIQUIS) tablet 2.5 mg   Lab Results  Component Value Date   PLT 221 11/22/2023      Code Status: Limited: Do not attempt resuscitation (DNR) -DNR-LIMITED -Do Not Intubate/DNI   Family Communication: No family at the side  Status is: Inpatient  Level of care: Telemetry  Consultants:  None  Objective: Vitals:   11/21/23 2021 11/22/23 0500 11/22/23 0619 11/22/23 0922  BP: 139/78  138/79   Pulse: 98  (!) 106 95  Resp: 18  18   Temp: 98 F (36.7 C)  97.8 F (36.6 C)   TempSrc:      SpO2: 100%  99% 98%   Weight:  49.9 kg      Intake/Output Summary (Last 24 hours) at 11/22/2023 1021 Last data filed at 11/22/2023 0700 Gross per 24 hour  Intake 480 ml  Output 700 ml  Net -220 ml   Wt Readings from Last 3 Encounters:  11/22/23 49.9 kg  11/12/23 51.3 kg  11/09/23 51.4 kg    Examination:  Constitutional: NAD Eyes: lids and conjunctivae normal, no scleral icterus ENMT: mmm Neck: normal, supple Respiratory: Diminished at bases, no wheezing Cardiovascular: Regular rate and rhythm, no murmurs / rubs / gallops.  Trace LE edema. Abdomen: soft, no distention, no tenderness. Bowel sounds positive.   Data Reviewed: I have independently reviewed following labs and imaging studies   CBC Recent Labs  Lab 11/18/23 1414 11/19/23 0536 11/20/23 0712 11/21/23 0625 11/22/23 0722  WBC 7.2 8.2 8.4 8.9 7.5  HGB 14.2 13.7 13.6 13.4 13.5  HCT 43.7 41.7 41.4 42.5 43.1  PLT 226 203 226 209 221  MCV 96.3 96.8 97.2 101.0* 100.0  MCH 31.3 31.8 31.9 31.8 31.3  MCHC 32.5 32.9 32.9 31.5 31.3  RDW 13.0 12.8 13.1 13.3 13.2    Recent Labs  Lab 11/18/23 1414 11/18/23 1436 11/18/23 1740 11/19/23 0536 11/20/23 0712 11/21/23 0625 11/22/23 0722  NA 125*  --   --  126* 128* 133* 133*  K 4.7  --   --  4.4 3.9 4.4 4.5  CL 82*  --   --  85* 90* 95* 92*  CO2 30  --   --  28 29 32 33*  GLUCOSE 113*  --   --  110* 137* 73 107*  BUN 24*  --   --  20 20 21 19   CREATININE 0.35*  --   --  0.56 0.47 0.54 0.44  CALCIUM 8.4*  --   --  8.1* 8.1* 8.0* 7.9*  AST 42*  --   --  39 38 33  --   ALT 20  --   --  22 24 21   --   ALKPHOS 69  --   --  71 75 67  --   BILITOT 1.5*  --   --  1.5* 1.2 0.9  --   ALBUMIN 3.7  --   --  3.6 3.5 3.0*  --   MG  --   --   --   --  2.2 2.3  --   LATICACIDVEN  --  1.5  --   --   --   --   --   AMMONIA  --   --  <10  --   --   --   --   BNP  --   --   --  663.1*  --   --   --      ------------------------------------------------------------------------------------------------------------------ No results for input(s): "CHOL", "HDL", "LDLCALC", "TRIG", "CHOLHDL", "LDLDIRECT" in the last 72 hours.  Lab Results  Component Value Date   HGBA1C 6.3 (H) 08/06/2021   ------------------------------------------------------------------------------------------------------------------ No results for input(s): "TSH", "T4TOTAL", "T3FREE", "THYROIDAB" in the last 72 hours.  Invalid input(s): "FREET3"  Cardiac Enzymes No results for input(s): "CKMB", "TROPONINI", "MYOGLOBIN" in the last 168 hours.  Invalid input(s): "CK" ------------------------------------------------------------------------------------------------------------------    Component Value Date/Time   BNP 663.1 (H) 11/19/2023 0536    CBG: Recent Labs  Lab 11/18/23 1334  GLUCAP 106*    Recent Results (from the past 240 hours)  Urine Culture     Status: Abnormal   Collection Time: 11/12/23 11:16 AM   Specimen: Urine, Clean Catch  Result Value Ref Range Status   Specimen Description URINE, CLEAN CATCH  Final   Special Requests NONE Reflexed from [id number]  Final   Culture (A)  Final    >=100,000 COLONIES/mL DIPHTHEROIDS(CORYNEBACTERIUM SPECIES) Standardized susceptibility testing for this organism is not available. Performed at [facility], [address], [place], [place] [address]    Report Status 11/14/2023 FINAL  Final  Blood culture (routine x 2)     Status: None (Preliminary result)   Collection Time: 11/18/23  2:46 PM   Specimen: BLOOD RIGHT ARM  Result Value Ref Range Status   Specimen Description   Final    BLOOD RIGHT ARM Performed at [facility], [address], [place], [place] [address]    Special Requests   Final    BOTTLES DRAWN AEROBIC AND ANAEROBIC Blood Culture results may not be optimal due to an inadequate volume of blood received in culture bottles Performed at  [facility], [address]  [place], [place] [address]    Culture   Final    NO GROWTH 4 DAYS Performed at [facility], [address] [place], [place] [address]    Report Status PENDING  Incomplete  Blood culture (routine x 2)     Status: None (Preliminary result)   Collection Time: 11/18/23  2:47 PM   Specimen: BLOOD LEFT ARM  Result Value Ref Range Status   Specimen Description   Final    BLOOD LEFT ARM Performed at [facility], [address] [place], [place] [address]    Special Requests   Final    BOTTLES DRAWN AEROBIC AND ANAEROBIC Blood Culture results may not be optimal due to an inadequate volume of blood received in culture bottles Performed at [facility], [address], [place], [place] [address]    Culture   Final    NO GROWTH 4 DAYS Performed at [facility], [address] [place], [place] [address]    Report Status PENDING  Incomplete     Radiology Studies: No results found.    Pamella Pert, MD, PhD [facility]  Between 7 am - 7 pm I am available, please contact me via Amion (for emergencies) or Securechat (non urgent messages)  Between 7 pm - 7 am I am not available, please contact night coverage MD/APP via Amion

## 2023-11-23 DIAGNOSIS — G9340 Encephalopathy, unspecified: Secondary | ICD-10-CM | POA: Diagnosis not present

## 2023-11-23 DIAGNOSIS — Z7189 Other specified counseling: Secondary | ICD-10-CM | POA: Diagnosis not present

## 2023-11-23 DIAGNOSIS — Z515 Encounter for palliative care: Secondary | ICD-10-CM

## 2023-11-23 LAB — BASIC METABOLIC PANEL
Anion gap: 7 (ref 5–15)
BUN: 18 mg/dL (ref 8–23)
CO2: 37 mmol/L — ABNORMAL HIGH (ref 22–32)
Calcium: 7.9 mg/dL — ABNORMAL LOW (ref 8.9–10.3)
Chloride: 89 mmol/L — ABNORMAL LOW (ref 98–111)
Creatinine, Ser: 0.47 mg/dL (ref 0.44–1.00)
GFR, Estimated: >60 mL/min (ref >60)
Glucose, Bld: 113 mg/dL — ABNORMAL HIGH (ref 70–99)
Potassium: 4.6 mmol/L (ref 3.5–5.1)
Sodium: 133 mmol/L — ABNORMAL LOW (ref 135–145)

## 2023-11-23 LAB — CULTURE, BLOOD (ROUTINE X 2)
Culture: NO GROWTH
Culture: NO GROWTH

## 2023-11-23 MED ORDER — MORPHINE SULFATE (PF) 2 MG/ML IV SOLN: 0.5000 mg | INTRAVENOUS | Status: DC | PRN

## 2023-11-23 NOTE — Plan of Care (Signed)
  Problem: Coping: Goal: Level of anxiety will decrease Outcome: Progressing   Problem: Elimination: Goal: Will not experience complications related to bowel motility Outcome: Progressing   Problem: Pain Managment: Goal: General experience of comfort will improve and/or be controlled Outcome: Progressing   Problem: Safety: Goal: Ability to remain free from injury will improve Outcome: Progressing

## 2023-11-23 NOTE — Progress Notes (Signed)
   11/23/23 0981  Assess: MEWS Score  Temp (!) 97.5 F (36.4 C)  BP (!) 155/76  MAP (mmHg) 98  Pulse Rate (!) 111  Resp 12  SpO2 99 %  O2 Device Nasal Cannula  O2 Flow Rate (L/min) 3 L/min  Assess: MEWS Score  MEWS Temp 0  MEWS Systolic 0  MEWS Pulse 2  MEWS RR 1  MEWS LOC 0  MEWS Score 3  MEWS Score Color Yellow  Assess: if the MEWS score is Yellow or Red  Were vital signs accurate and taken at a resting state? Yes  Does the patient meet 2 or more of the SIRS criteria? No  MEWS guidelines implemented  Yes, yellow  Treat  MEWS Interventions Considered administering scheduled or prn medications/treatments as ordered  Take Vital Signs  Increase Vital Sign Frequency  Yellow: Q2hr x1, continue Q4hrs until patient remains green for 12hrs  Escalate  MEWS: Escalate Yellow: Discuss with charge nurse and consider notifying provider and/or RRT  Notify: Charge Nurse/RN  Name of Charge Nurse/RN Notified Tom, RN  Provider Notification  Provider Name/Title Chinita Greenland, NP  Date Provider Notified 11/23/23  Time Provider Notified [phone]  Method of Notification Call (secure chat)  Notification Reason Other (Comment) (Yellow MEWS)  Assess: SIRS CRITERIA  SIRS Temperature  0  SIRS Respirations  0  SIRS Pulse 1  SIRS WBC 0  SIRS Score Sum  1

## 2023-11-23 NOTE — Consult Note (Signed)
 Consultation Note Date: 11/23/2023   Patient Name: [name]  DOB: 01-May-1936  MRN: [id number]  Age / Sex: 88 y.o., female  PCP: Adrian Prince, MD Referring Physician: Leatha Gilding, MD  Reason for Consultation: Establishing goals of care  HPI/Patient Profile: 88 y.o. female  admitted on 11/18/2023.   Clinical Assessment and Goals of Care: 88 year old lady with coronary artery disease, paroxysmal atrial fibrillation on Eliquis, hypothyroidism, chronic hyponatremia and recent influenza infection, COVID infection late last year in 2024.  Patient with gradual progressive functional decline for the past few months. Patient admitted with acute encephalopathy, ongoing generalized weakness, acute on chronic hyponatremia, failure to thrive and chronic diastolic CHF.  She was started on Remeron.  MRI of the brain without acute findings.  Not able to participate with PT.  Palliative consult for ongoing goals of care discussions has been requested. Palliative consult request received.  Patient seen and examined.  Discussed with husband and daughter present at the bedside. Palliative medicine is specialized medical care for people living with serious illness. It focuses on providing relief from the symptoms and stress of a serious illness. The goal is to improve quality of life for both the patient and the family. Goals of care: Broad aims of medical therapy in relation to the patient's values and preferences. Our aim is to provide medical care aimed at enabling patients to achieve the goals that matter most to them, given the circumstances of their particular medical situation and their constraints.  Patient appears to be an elderly lady who is not awake not alert.  She has shallow breathing.  She does not appear to have nonverbal gestures of distress or discomfort.  Discussed frankly but compassionately about the  patient's ongoing decline, failure to thrive.  Discussed about comfort measures and dying process.  Introduced hospice philosophy of care.  See below.  NEXT OF KIN  Husband and daughter.   SUMMARY OF RECOMMENDATIONS   DNR/DNI Recommend addition of hospice services, goals of care discussions undertaken with patient's husband and daughter present at the bedside: They request for the patient to go back to her regular facility-care and wellness center with the addition of hospice services.   Will request TOC input otherwise, continue current mode of care for now.  Code Status/Advance Care Planning: DNR   Symptom Management:     Palliative Prophylaxis:  Delirium Protocol   Psycho-social/Spiritual:  Desire for further Chaplaincy support:yes Additional Recommendations: Education on Hospice  Prognosis:  < 2 weeks  Discharge Planning:  back to facility with hospice.        Primary Diagnoses: Present on Admission:  Acute encephalopathy  PAF (paroxysmal atrial fibrillation) (HCC)  Hypothyroidism  History of pulmonary embolism  Essential hypertension, benign  CAD- severe LAD and RCA disease with collaterals from CFX Sept 2009. Medical Rx  Chronic heart failure with preserved ejection fraction (HFpEF) (HCC)  Hyponatremia   I have reviewed the medical record, interviewed the patient and family, and examined the patient. The following aspects are pertinent.  Past Medical History:  Diagnosis Date   Aortic stenosis    Atrial fibrillation (HCC)    CAD, UNSPECIFIED SITE    2009. 99% proximal RCA stenosis followed by 80% distal stenosis. The LAD has a 100% stenosis in the midsegment. The circumflex is patent with distal occlusion. She has been managed medically. Her EF was well-preserved.   CAROTID STENOSIS    Chronic diastolic (congestive) heart failure (HCC)    DIVERTICULAR DISEASE    Elevated troponin- 0.58 in setting of PSVT 10/29/2013   Essential hypertension, benign     Gastritis    H/O blood clots 1984   DVT and PE   Hx of degenerative disc disease    HYPERLIPIDEMIA    MACULAR DEGENERATION    OSTEOPOROSIS    Rheumatoid arthritis(714.0)    Thyroid cancer (HCC) 2008   Social History   Socioeconomic History   Marital status: Married    Spouse name: Not on file   Number of children: 1   Years of education: Not on file   Highest education level: Not on file  Occupational History   Occupation: retired    Comment: Diplomatic Services operational officer  Tobacco Use   Smoking status: Never   Smokeless tobacco: Never  Vaping Use   Vaping status: Never Used  Substance and Sexual Activity   Alcohol use: No   Drug use: No   Sexual activity: Not Currently    Partners: Male  Other Topics Concern   Not on file  Social History Narrative   Married, retired1 daughter   Right handed   1 story   Social Drivers of Health   Financial Resource Strain: Not on file  Food Insecurity: No Food Insecurity (11/18/2023)   Hunger Vital Sign    Worried About Running Out of Food in the Last Year: Never true    Ran Out of Food in the Last Year: Never true  Transportation Needs: No Transportation Needs (11/18/2023)   PRAPARE - Administrator, Civil Service (Medical): No    Lack of Transportation (Non-Medical): No  Physical Activity: Not on file  Stress: Not on file  Social Connections: Socially Integrated (11/18/2023)   Social Connection and Isolation Panel [NHANES]    Frequency of Communication with Friends and Family: More than three times a week    Frequency of Social Gatherings with Friends and Family: More than three times a week    Attends Religious Services: More than 4 times per year    Active Member of [employer] or Organizations: Yes    Attends Engineer, structural: More than 4 times per year    Marital Status: Married   Family History  Problem Relation Age of Onset   Heart attack Mother    Clotting disorder Mother    Heart disease Father    Diabetes Father     CAD Sister    Stroke Sister    Lung cancer Brother    Ovarian cancer Maternal Aunt    Lung cancer Maternal Uncle    Diabetes Niece    Scheduled Meds:  apixaban  2.5 mg Oral BID   atorvastatin  40 mg Oral QHS   feeding supplement  237 mL Oral BID BM   furosemide  20 mg Oral Daily   isosorbide mononitrate  30 mg Oral q AM   levothyroxine  88 mcg Oral QAC breakfast   metoprolol succinate  100 mg Oral Daily   mirtazapine  15 mg Oral QHS   sodium chloride  flush  3 mL Intravenous Q12H   sodium chloride  1 g Oral TID WC   Continuous Infusions: PRN Meds:.acetaminophen **OR** acetaminophen, albuterol, morphine injection, prochlorperazine, senna-docusate Medications Prior to Admission:  Prior to Admission medications   Medication Sig Start Date End Date Taking? Authorizing Provider  acetaminophen (TYLENOL) 500 MG tablet Take 500-1,000 mg by mouth See admin instructions. Take 500-1,000 mg by mouth with breakfast and at bedtime   Yes [provider]  amLODipine (NORVASC) 5 MG tablet Take 5 mg by mouth at bedtime.   Yes [provider]  atorvastatin (LIPITOR) 40 MG tablet Take 1 tablet (40 mg total) by mouth daily. Patient taking differently: Take 40 mg by mouth at bedtime. 09/05/21  Yes Monge, Petra Kuba, NP  calcium carbonate (TUMS - DOSED IN MG ELEMENTAL CALCIUM) 500 MG chewable tablet Chew 1 tablet by mouth daily as needed for indigestion or heartburn.   Yes [provider]  Calcium Carbonate-Vitamin D (CALCIUM + D PO) Take 1 tablet by mouth See admin instructions. Take 1 tablet by mouth with lunch and supper   Yes [provider]  ELIQUIS 2.5 MG TABS tablet TAKE 1 TABLET(2.5 MG) BY MOUTH TWICE DAILY Patient taking differently: Take 2.5 mg by mouth See admin instructions. Take 2.5 mg by mouth with breakfast and supper 08/23/23  Yes Hochrein, Fayrene Fearing, MD  furosemide (LASIX) 20 MG tablet TAKE 1 TABLET(20 MG) BY MOUTH DAILY Patient taking differently: Take 20 mg  by mouth daily with breakfast. 10/08/23  Yes Monge, Petra Kuba, NP  isosorbide mononitrate (IMDUR) 30 MG 24 hr tablet Take 30 mg by mouth See admin instructions.  Take 30 mg by mouth mid-morning   Yes [provider]  levothyroxine (SYNTHROID) 88 MCG tablet Take 88 mcg by mouth daily before breakfast.   Yes [provider]  losartan (COZAAR) 50 MG tablet Take 50 mg by mouth daily with breakfast.   Yes [provider]  metoprolol succinate (TOPROL-XL) 100 MG 24 hr tablet Take 1 tablet (100 mg total) by mouth daily. Take with or immediately following a meal. Patient taking differently: Take 100 mg by mouth See admin instructions. Take 100 mg by mouth with breakfast and supper- with or immediately following a meal. 11/14/23  Yes Chatterjee, Raynaldo Opitz, MD  Multiple Vitamins-Minerals (CENTRUM SILVER 50+WOMEN) TABS Take 1 tablet by mouth daily with lunch.   Yes [provider]  Multiple Vitamins-Minerals (PRESERVISION AREDS) CAPS Take 1 capsule by mouth See admin instructions. Take 1 capsule by mouth with lunch and supper   Yes [provider]  nitroGLYCERIN (NITROSTAT) 0.4 MG SL tablet For chest pain, tightness, or pressure. While sitting, place 1 tablet under tongue. May be used every 5 minutes as needed, for up to 15 minutes. Do not use more than 3 tablets. Patient taking differently: Place 0.4 mg under the tongue every 5 (five) minutes x 3 doses as needed for chest pain (tightness, or pressure- sit when placing under the tongue). 09/30/22  Yes Rollene Rotunda, MD  Olopatadine HCl (PATADAY OP) Place 1 drop into both eyes in the morning.   Yes [provider]  levofloxacin (LEVAQUIN) 500 MG tablet Take 1 tablet (500 mg total) by mouth daily. Patient not taking: Reported on 11/18/2023 11/09/23   Eloisa Northern, MD   Allergies  Allergen Reactions   Hydrocodone Other (See Comments)    "feels weird"   Prednisone Nausea And Vomiting and Other (See Comments)     Oral version =  N/V   Review of Systems Not awake not alert Physical Exam Not awake not alert Shallow regular breathing Abdomen not distended Trace edema  Vital Signs: BP (!) 149/91 (BP Location: Right Arm)   Pulse (!) 106   Temp 97.8 F (36.6 C)   Resp 14   Wt 49.9 kg   SpO2 97%   BMI 19.49 kg/m  Pain Scale: 0-10   Pain Score: 0-No pain   SpO2: SpO2: 97 % O2 Device:SpO2: 97 % O2 Flow Rate: .O2 Flow Rate (L/min): 3 L/min  IO: Intake/output summary:  Intake/Output Summary (Last 24 hours) at 11/23/2023 1612 Last data filed at 11/22/2023 1636 Gross per 24 hour  Intake 120 ml  Output --  Net 120 ml    LBM: Last BM Date : 11/19/23 Baseline Weight: Weight: 49.9 kg Most recent weight: Weight: 49.9 kg     Palliative Assessment/Data:   PPS 30%  Time In:  1500 Time Out:  1600 Time Total:  60  Greater than 50%  of this time was spent counseling and coordinating care related to the above assessment and plan.  Signed by: Rosalin Hawking, MD   Please contact Palliative Medicine Team phone at [phone] for questions and concerns.  For individual provider: See Loretha Stapler

## 2023-11-23 NOTE — Progress Notes (Signed)
 PHYSICAL THERAPY  Awaiting Palliative Consult. Pt is NOT progressing/thriving.  Pt in bed not able to participate with Physical Therapy today.  Daughter and Spouse at bed side holding her hands.  Will continue to follow.  Felecia Shelling  PTA Acute  Rehabilitation Services Office M-F          [phone]

## 2023-11-23 NOTE — Progress Notes (Signed)
 PROGRESS NOTE  [name] [id number] DOB: 20-May-1936 DOA: 11/18/2023 PCP: Adrian Prince, MD   LOS: 4 days   Brief Narrative / Interim history: 88 year old female with history of CAD, PAF on Eliquis, hypothyroidism, chronic hyponatremia, recent admission for influenza A comes in back to the hospital for poor p.o. intake, increased confusion, increased shortness of breath and low sodium on outpatient blood work.  Patient and her husband had COVID late last year, and here she has been having very poor appetite since.  She reports worsening shortness of breath, but does admit that this has been going on for at least several years.  No abdominal discomfort, no nausea or vomiting, no fever or chills.  Husband also reports occasional jerking movements in her upper extremities  Subjective / 24h Interval events: Feels a lot weaker today.  Daughter is at bedside  Assesement and Plan: Principal Problem:   Acute encephalopathy Active Problems:   PAF (paroxysmal atrial fibrillation) (HCC)   History of pulmonary embolism   Essential hypertension, benign   Hypothyroidism   CAD- severe LAD and RCA disease with collaterals from CFX Sept 2009. Medical Rx   Hyponatremia   Chronic heart failure with preserved ejection fraction (HFpEF) (HCC)  Principal problem Acute on chronic hyponatremia -this has been an issue for several years, going back as far as 2015.  Her sodium can range as an outpatient from upper 120s to low 130s. -She does appear intravascularly depleted, so her hyponatremia is multifactorial in the setting of poor solute intake, also contributing is her home furosemide -With salt tabs, fluid restriction and initial limited isotonic IV fluids, her sodium has now improved and has remained stable at 133.  Her Lasix was resumed and sodium remaining stable  Active problems Chronic diastolic CHF, elevated PA pressure -recent 2D echo showed normal LVEF, right heart cath done January 2025  showed elevated RV pressure and PA pressure.  Imaging also showed mild mitral valve regurgitation, mild to moderate tricuspid valve regurgitation and aortic stenosis -There is a degree of third spacing with elevated BNP and trace bilateral pleural effusions, as well as trace lower extremity edema -Sinew Lasix, she seems to be tolerating it well with stable sodium  Depression, failure to thrive -patient does report worsening depression, hopelessness and has been losing significant amount of weight in the setting of poor appetite following her COVID infection last year. -Patient was started on Remeron, reports improved sleep  Goals of care-discussed with the patient's daughter at bedside.  Despite improvement in her sodium level, she continues to remain extremely frail, weak.  It is asking me and her daughter this morning whether she is still alive.  There is as been a significant decline in the last several months, has had weight loss and overall she is failing to thrive.  Does not seem to be responding to medical management.  Palliative care consulted today  Increased confusion-MRI of the brain without acute findings.  Mental status fluctuates  Hypothyroidism-continue Synthroid, TSH normal  Lab Results  Component Value Date   TSH 0.390 11/12/2023   PAF-continue metoprolol, Eliquis for anticoagulation  Hyperlipidemia-continue atorvastatin  CAD-cath done end of January showed CAD, medical management recommended.  Continue Imdur, beta-blockers, statin.  No chest pain  Scheduled Meds:  apixaban  2.5 mg Oral BID   atorvastatin  40 mg Oral QHS   feeding supplement  237 mL Oral BID BM   furosemide  20 mg Oral Daily   isosorbide mononitrate  30 mg Oral  q AM   levothyroxine  88 mcg Oral QAC breakfast   metoprolol succinate  100 mg Oral Daily   mirtazapine  15 mg Oral QHS   sodium chloride flush  3 mL Intravenous Q12H   sodium chloride  1 g Oral TID WC   Continuous Infusions:  PRN  Meds:.acetaminophen **OR** acetaminophen, albuterol, prochlorperazine, senna-docusate  Current Outpatient Medications  Medication Instructions   acetaminophen (TYLENOL) 500-1,000 mg, See admin instructions   amLODipine (NORVASC) 5 mg, Oral, Daily at bedtime   atorvastatin (LIPITOR) 40 mg, Oral, Daily,     calcium carbonate (TUMS - DOSED IN MG ELEMENTAL CALCIUM) 500 MG chewable tablet 1 tablet, Oral, Daily PRN   Calcium Carbonate-Vitamin D (CALCIUM + D PO) 1 tablet, Oral, See admin instructions, Take 1 tablet by mouth with lunch and supper   ELIQUIS 2.5 MG TABS tablet TAKE 1 TABLET(2.5 MG) BY MOUTH TWICE DAILY   furosemide (LASIX) 20 MG tablet TAKE 1 TABLET(20 MG) BY MOUTH DAILY   isosorbide mononitrate (IMDUR) 30 mg, See admin instructions   levofloxacin (LEVAQUIN) 500 mg, Oral, Daily   levothyroxine (SYNTHROID) 88 mcg, Daily before breakfast   losartan (COZAAR) 50 mg, Daily with breakfast   metoprolol succinate (TOPROL-XL) 100 mg, Oral, Daily, Take with or immediately following a meal.   Multiple Vitamins-Minerals (CENTRUM SILVER 50+WOMEN) TABS 1 tablet, Daily with lunch   Multiple Vitamins-Minerals (PRESERVISION AREDS) CAPS 1 capsule, See admin instructions   nitroGLYCERIN (NITROSTAT) 0.4 MG SL tablet For chest pain, tightness, or pressure. While sitting, place 1 tablet under tongue. May be used every 5 minutes as needed, for up to 15 minutes. Do not use more than 3 tablets.   Olopatadine HCl (PATADAY OP) 1 drop, Both Eyes, Every morning    Diet Orders (From admission, onward)     Start     Ordered   11/18/23 1644  Diet regular Fluid consistency: Thin; Fluid restriction: 1500 mL Fluid  Diet effective now       Question Answer Comment  Fluid consistency: Thin   Fluid restriction: 1500 mL Fluid      11/18/23 1643            DVT prophylaxis: apixaban (ELIQUIS) tablet 2.5 mg Start: 11/18/23 2200 apixaban (ELIQUIS) tablet 2.5 mg   Lab Results  Component Value Date   PLT 221  11/22/2023      Code Status: Limited: Do not attempt resuscitation (DNR) -DNR-LIMITED -Do Not Intubate/DNI   Family Communication: Daughter at bedside  Status is: Inpatient  Level of care: Telemetry  Consultants:  None  Objective: Vitals:   11/23/23 0455 11/23/23 0635 11/23/23 0908 11/23/23 1223  BP:  (!) 155/76 (!) 155/76 (!) 149/91  Pulse:  (!) 111 (!) 111 (!) 106  Resp:  12  14  Temp:  (!) 97.5 F (36.4 C)  97.8 F (36.6 C)  TempSrc:      SpO2:  99%  97%  Weight: 49.9 kg       Intake/Output Summary (Last 24 hours) at 11/23/2023 1313 Last data filed at 11/22/2023 1636 Gross per 24 hour  Intake 120 ml  Output --  Net 120 ml   Wt Readings from Last 3 Encounters:  11/23/23 49.9 kg  11/12/23 51.3 kg  11/09/23 51.4 kg    Examination:  Constitutional: NAD Eyes: lids and conjunctivae normal, no scleral icterus ENMT: mmm Neck: normal, supple Respiratory: clear to auscultation bilaterally, no wheezing, no crackles. Normal respiratory effort.  Cardiovascular: Regular rate and rhythm,  no murmurs / rubs / gallops. No LE edema. Abdomen: soft, no distention, no tenderness. Bowel sounds positive.   Data Reviewed: I have independently reviewed following labs and imaging studies   CBC Recent Labs  Lab 11/18/23 1414 11/19/23 0536 11/20/23 0712 11/21/23 0625 11/22/23 0722  WBC 7.2 8.2 8.4 8.9 7.5  HGB 14.2 13.7 13.6 13.4 13.5  HCT 43.7 41.7 41.4 42.5 43.1  PLT 226 203 226 209 221  MCV 96.3 96.8 97.2 101.0* 100.0  MCH 31.3 31.8 31.9 31.8 31.3  MCHC 32.5 32.9 32.9 31.5 31.3  RDW 13.0 12.8 13.1 13.3 13.2    Recent Labs  Lab 11/18/23 1414 11/18/23 1436 11/18/23 1740 11/19/23 0536 11/20/23 0712 11/21/23 0625 11/22/23 0722 11/23/23 0742  NA 125*  --   --  126* 128* 133* 133* 133*  K 4.7  --   --  4.4 3.9 4.4 4.5 4.6  CL 82*  --   --  85* 90* 95* 92* 89*  CO2 30  --   --  28 29 32 33* 37*  GLUCOSE 113*  --   --  110* 137* 73 107* 113*  BUN 24*  --   --  20  20 21 19 18   CREATININE 0.35*  --   --  0.56 0.47 0.54 0.44 0.47  CALCIUM 8.4*  --   --  8.1* 8.1* 8.0* 7.9* 7.9*  AST 42*  --   --  39 38 33  --   --   ALT 20  --   --  22 24 21   --   --   ALKPHOS 69  --   --  71 75 67  --   --   BILITOT 1.5*  --   --  1.5* 1.2 0.9  --   --   ALBUMIN 3.7  --   --  3.6 3.5 3.0*  --   --   MG  --   --   --   --  2.2 2.3  --   --   LATICACIDVEN  --  1.5  --   --   --   --   --   --   AMMONIA  --   --  <10  --   --   --   --   --   BNP  --   --   --  663.1*  --   --   --   --     ------------------------------------------------------------------------------------------------------------------ No results for input(s): "CHOL", "HDL", "LDLCALC", "TRIG", "CHOLHDL", "LDLDIRECT" in the last 72 hours.  Lab Results  Component Value Date   HGBA1C 6.3 (H) 08/06/2021   ------------------------------------------------------------------------------------------------------------------ No results for input(s): "TSH", "T4TOTAL", "T3FREE", "THYROIDAB" in the last 72 hours.  Invalid input(s): "FREET3"  Cardiac Enzymes No results for input(s): "CKMB", "TROPONINI", "MYOGLOBIN" in the last 168 hours.  Invalid input(s): "CK" ------------------------------------------------------------------------------------------------------------------    Component Value Date/Time   BNP 663.1 (H) 11/19/2023 0536    CBG: Recent Labs  Lab 11/18/23 1334  GLUCAP 106*    Recent Results (from the past 240 hours)  Blood culture (routine x 2)     Status: None   Collection Time: 11/18/23  2:46 PM   Specimen: BLOOD RIGHT ARM  Result Value Ref Range Status   Specimen Description   Final    BLOOD RIGHT ARM Performed at [facility], [address], [place], [place] [address]    Special Requests   Final  BOTTLES DRAWN AEROBIC AND ANAEROBIC Blood Culture results may not be optimal due to an inadequate volume of blood received in culture bottles Performed at [facility], [address], [place], [place] [address]    Culture   Final    NO GROWTH 5 DAYS Performed at [facility], [address] [place], [place] [address]    Report Status 11/23/2023 FINAL  Final  Blood culture (routine x 2)     Status: None   Collection Time: 11/18/23  2:47 PM   Specimen: BLOOD LEFT ARM  Result Value Ref Range Status   Specimen Description   Final    BLOOD LEFT ARM Performed at [facility], [address], [place], [place] [address]    Special Requests   Final    BOTTLES DRAWN AEROBIC AND ANAEROBIC Blood Culture results may not be optimal due to an inadequate volume of blood received in culture bottles Performed at [facility], [address] [place], [place] [address]    Culture   Final    NO GROWTH 5 DAYS Performed at [facility], [address], [place], [place] [address]    Report Status 11/23/2023 FINAL  Final     Radiology Studies: No results found.    Pamella Pert, MD, PhD [facility]  Between 7 am - 7 pm I am available, please contact me via Amion (for emergencies) or Securechat (non urgent messages)  Between 7 pm - 7 am I am not available, please contact night coverage MD/APP via Amion

## 2023-11-23 NOTE — [place] FL2 (Signed)
 [place] MEDICAID FL2 LEVEL OF CARE FORM     IDENTIFICATION  Patient Name: [name] Birthdate: June 13, 1936 Sex: female Admission Date (Current Location): 11/18/2023  [facility] and [place] Number:  Producer, television/film/video and Address:  [facility],  [address] [place], [place] [address]      Provider Number: [id number]  Attending Physician Name and Address:  Leatha Gilding, MD  Relative Name and Phone Number:  Jesselyn, Rask (Spouse)  [phone]    Current Level of Care: Hospital Recommended Level of Care: Skilled Nursing Facility Prior Approval Number:    Date Approved/Denied:   PASRR Number: [id number]  Discharge Plan: SNF    Current Diagnoses: Patient Active Problem List   Diagnosis Date Noted   Acute encephalopathy 11/18/2023   Melena 11/12/2023   Influenza A 11/12/2023   Sepsis (HCC) 11/12/2023   Cystitis 11/12/2023   Urinary tract infection without hematuria 11/09/2023   Acute bronchitis due to other specified organisms 11/09/2023   Excessive ear wax, left 03/23/2023   Nonrheumatic aortic valve stenosis 03/20/2023   Acute respiratory failure with hypoxia (HCC) 09/25/2022   Hypervolemia 09/25/2022   DNR (do not resuscitate)/DNI(Do Not Intubate) 09/25/2022   Chronic heart failure with preserved ejection fraction (HFpEF) (HCC) 09/25/2022   PAF (paroxysmal atrial fibrillation) (HCC) 08/05/2021   Stable branch retinal vein occlusion of left eye 08/19/2020   Exudative age-related macular degeneration of right eye with inactive choroidal neovascularization (HCC) 08/19/2020   Dry eyes 04/16/2020   Intermediate stage nonexudative age-related macular degeneration of both eyes 04/16/2020   Dizziness 11/30/2019   Dyslipidemia 05/18/2019   Coronary artery disease involving native coronary artery of native heart without angina pectoris 01/01/2019   Tremor 11/01/2018   Shortness of breath 09/25/2018   Hypothyroidism 10/30/2013   TIA (transient ischemic  attack)- remote 10/30/2013   Dyspnea 10/28/2013   PSVT (paroxysmal supraventricular tachycardia) (HCC) 10/28/2013   History of pulmonary embolism 10/28/2013   Hyponatremia 10/28/2013   Osteoporosis    Essential hypertension, benign 07/11/2009   HYPERLIPIDEMIA 01/10/2009   CAROTID STENOSIS- moderate 01/10/2009   Macular degeneration (senile) of retina 01/09/2009   CAD- severe LAD and RCA disease with collaterals from CFX Sept 2009. Medical Rx 01/09/2009   Diverticulosis of colon 01/09/2009   Rheumatoid arthritis (HCC) 01/09/2009   Osteoporosis 01/09/2009    Orientation RESPIRATION BLADDER Height & Weight     Self, Situation, Place  O2 (3L) Continent Weight: 110 lb 0.2 oz (49.9 kg) Height:     BEHAVIORAL SYMPTOMS/MOOD NEUROLOGICAL BOWEL NUTRITION STATUS      Continent    AMBULATORY STATUS COMMUNICATION OF NEEDS Skin   Limited Assist Verbally Normal                       Personal Care Assistance Level of Assistance  Bathing, Feeding, Dressing Bathing Assistance: Limited assistance Feeding assistance: Independent       Functional Limitations Info  Sight, Hearing, Speech Sight Info: Impaired Hearing Info: Adequate Speech Info: Adequate    SPECIAL CARE FACTORS FREQUENCY  PT (By licensed PT), OT (By licensed OT)     PT Frequency: 5x/wk OT Frequency: 5x/wk            Contractures Contractures Info: Not present    Additional Factors Info  Code Status, Allergies Code Status Info: DNR Allergies Info: Hydrocodone, Prednisone           Current Medications (11/23/2023):  This is the current hospital active medication list  Current Facility-Administered Medications  Medication Dose Route Frequency Provider Last Rate Last Admin   acetaminophen (TYLENOL) tablet 650 mg  650 mg Oral Q6H PRN Opyd, Lavone Neri, MD       Or   acetaminophen (TYLENOL) suppository 650 mg  650 mg Rectal Q6H PRN Opyd, Lavone Neri, MD       albuterol (PROVENTIL) (2.5 MG/3ML) 0.083% nebulizer  solution 2.5 mg  2.5 mg Nebulization Q4H PRN Leatha Gilding, MD   2.5 mg at 11/23/23 0640   apixaban (ELIQUIS) tablet 2.5 mg  2.5 mg Oral BID Opyd, Lavone Neri, MD   2.5 mg at 11/23/23 0909   atorvastatin (LIPITOR) tablet 40 mg  40 mg Oral QHS Opyd, Lavone Neri, MD   40 mg at 11/22/23 2104   feeding supplement (ENSURE ENLIVE / ENSURE PLUS) liquid 237 mL  237 mL Oral BID BM Leatha Gilding, MD   237 mL at 11/23/23 0909   furosemide (LASIX) tablet 20 mg  20 mg Oral Daily Leatha Gilding, MD   20 mg at 11/23/23 1610   isosorbide mononitrate (IMDUR) 24 hr tablet 30 mg  30 mg Oral q AM Opyd, Lavone Neri, MD   30 mg at 11/23/23 9604   levothyroxine (SYNTHROID) tablet 88 mcg  88 mcg Oral QAC breakfast Briscoe Deutscher, MD   88 mcg at 11/22/23 5409   metoprolol succinate (TOPROL-XL) 24 hr tablet 100 mg  100 mg Oral Daily Opyd, Lavone Neri, MD   100 mg at 11/23/23 0908   mirtazapine (REMERON) tablet 15 mg  15 mg Oral QHS Leatha Gilding, MD   15 mg at 11/22/23 2104   prochlorperazine (COMPAZINE) injection 5 mg  5 mg Intravenous Q6H PRN Opyd, Lavone Neri, MD       senna-docusate (Senokot-S) tablet 1 tablet  1 tablet Oral QHS PRN Opyd, Lavone Neri, MD       sodium chloride flush (NS) 0.9 % injection 3 mL  3 mL Intravenous Q12H Opyd, Lavone Neri, MD   3 mL at 11/23/23 0910   sodium chloride tablet 1 g  1 g Oral TID WC Leatha Gilding, MD   1 g at 11/23/23 0909     Discharge Medications: Please see discharge summary for a list of discharge medications.  Relevant Imaging Results:  Relevant Lab Results:   Additional Information SS# [id number]  Otelia Santee, LCSW

## 2023-11-23 NOTE — TOC Progression Note (Signed)
 Transition of Care [facility]) - Progression Note    Patient Details  Name: [name] MRN: [id number] Date of Birth: [birth year]  [facility] CM/SW Contact  Otelia Santee, LCSW Phone Number: 11/23/2023, 9:37 AM  Clinical Narrative:    Pt's spouse wanting pt to transfer to SNF at [facility] prior to returning to [facility]. Spoke with [place] at [facility] who is agreeable to this plan. FL-2 completed. Insurance Berkley Harvey has been requested and is currently pending.    Expected Discharge Plan: Home w Home Health Services Barriers to Discharge: No Barriers Identified  Expected Discharge Plan and Services   Discharge Planning Services: NA Post Acute Care Choice: Home Health Living arrangements for the past 2 months: Independent Living Facility                   DME Agency: NA                   Social Determinants of Health (SDOH) Interventions SDOH Screenings   Food Insecurity: No Food Insecurity (11/18/2023)  Housing: Low Risk  (11/18/2023)  Transportation Needs: No Transportation Needs (11/18/2023)  Utilities: Not At Risk (11/18/2023)  Social Connections: Socially Integrated (11/18/2023)  Tobacco Use: Low Risk  (11/18/2023)    Readmission Risk Interventions    11/19/2023    2:48 PM  Readmission Risk Prevention Plan  Transportation Screening Complete  PCP or Specialist Appt within 5-7 Days Complete  Home Care Screening Complete  Medication Review (RN CM) Complete

## 2023-11-24 DIAGNOSIS — I480 Paroxysmal atrial fibrillation: Secondary | ICD-10-CM | POA: Diagnosis not present

## 2023-11-24 DIAGNOSIS — Z86711 Personal history of pulmonary embolism: Secondary | ICD-10-CM | POA: Diagnosis not present

## 2023-11-24 DIAGNOSIS — E871 Hypo-osmolality and hyponatremia: Secondary | ICD-10-CM | POA: Diagnosis not present

## 2023-11-24 DIAGNOSIS — G9340 Encephalopathy, unspecified: Secondary | ICD-10-CM | POA: Diagnosis not present

## 2023-11-24 LAB — MAGNESIUM: Magnesium: 2.6 mg/dL — ABNORMAL HIGH (ref 1.7–2.4)

## 2023-11-24 LAB — CBC
HCT: 45.1 % (ref 36.0–46.0)
Hemoglobin: 13.3 g/dL (ref 12.0–15.0)
MCH: 31.4 pg (ref 26.0–34.0)
MCHC: 29.5 g/dL — ABNORMAL LOW (ref 30.0–36.0)
MCV: 106.4 fL — ABNORMAL HIGH (ref 80.0–100.0)
Platelets: 212 10*3/uL (ref 150–400)
RBC: 4.24 MIL/uL (ref 3.87–5.11)
RDW: 13.1 % (ref 11.5–15.5)
WBC: 6.5 10*3/uL (ref 4.0–10.5)
nRBC: 0 % (ref 0.0–0.2)

## 2023-11-24 LAB — COMPREHENSIVE METABOLIC PANEL
ALT: 22 U/L (ref 0–44)
AST: 33 U/L (ref 15–41)
Albumin: 3.1 g/dL — ABNORMAL LOW (ref 3.5–5.0)
Alkaline Phosphatase: 71 U/L (ref 38–126)
Anion gap: 9 (ref 5–15)
BUN: 37 mg/dL — ABNORMAL HIGH (ref 8–23)
CO2: 38 mmol/L — ABNORMAL HIGH (ref 22–32)
Calcium: 8.2 mg/dL — ABNORMAL LOW (ref 8.9–10.3)
Chloride: 91 mmol/L — ABNORMAL LOW (ref 98–111)
Creatinine, Ser: 0.71 mg/dL (ref 0.44–1.00)
GFR, Estimated: >60 mL/min (ref >60)
Glucose, Bld: 87 mg/dL (ref 70–99)
Potassium: 5.2 mmol/L — ABNORMAL HIGH (ref 3.5–5.1)
Sodium: 138 mmol/L (ref 135–145)
Total Bilirubin: 1.1 mg/dL (ref 0.0–1.2)
Total Protein: 7.8 g/dL (ref 6.5–8.1)

## 2023-11-24 MED ORDER — ENSURE ENLIVE PO LIQD: 237.0000 mL | Freq: Two times a day (BID) | ORAL | 12 refills | Status: DC

## 2023-11-24 MED ORDER — MIRTAZAPINE 15 MG PO TABS: 15.0000 mg | ORAL_TABLET | Freq: Every day | ORAL | Status: DC

## 2023-11-24 MED ORDER — DEXTROSE IN LACTATED RINGERS 5 % IV SOLN: INTRAVENOUS | Status: DC

## 2023-11-24 MED ORDER — DILTIAZEM HCL-DEXTROSE 125-5 MG/125ML-% IV SOLN (PREMIX)
5.0000 mg/h | INTRAVENOUS | Status: DC
  Filled 2023-11-24: qty 125

## 2023-11-24 MED ORDER — METOPROLOL TARTRATE 5 MG/5ML IV SOLN
2.5000 mg | Freq: Three times a day (TID) | INTRAVENOUS | Status: DC | PRN
Start: 2023-11-24 — End: 2023-11-24
  Administered 2023-11-24: 2.5 mg via INTRAVENOUS
  Filled 2023-11-24: qty 5

## 2023-12-04 NOTE — Discharge Summary (Addendum)
 Physician Discharge Summary   Patient: [name] MRN: [id number] DOB: Dec 14, 1935  Admit date:     11/18/2023  Discharge date: 11/06/2023  Discharge Physician: Kathlen Mody   PCP: Adrian Prince, MD   Recommendations at discharge:  Please follow up with Hospice MD as needed/ recommended.   Discharge Diagnoses: Principal Problem:   Acute encephalopathy Active Problems:   PAF (paroxysmal atrial fibrillation) (HCC)   History of pulmonary embolism   Essential hypertension, benign   Hypothyroidism   CAD- severe LAD and RCA disease with collaterals from CFX Sept 2009. Medical Rx   Hyponatremia   Chronic heart failure with preserved ejection fraction (HFpEF) [facility])    Hospital Course:  88 year old female with history of CAD, PAF on Eliquis, hypothyroidism, chronic hyponatremia, recent admission for influenza A comes in back to the hospital for poor p.o. intake, increased confusion, increased shortness of breath and low sodium on outpatient blood work. Patient and her husband had COVID late last year, and here she has been having very poor appetite since. She reports worsening shortness of breath, but does admit that this has been going on for at least several years. No abdominal discomfort, no nausea or vomiting, no fever or chills.  She was found to have hyponatremia, which has resolved.   Assessment and Plan:  Acute on chronic hyponatremia -this has been an issue for several years, going back as far as 2015.  Her sodium can range as an outpatient from upper 120s to low 130s. -She does appear intravascularly depleted, so her hyponatremia is multifactorial in the setting of poor oral intake.  -With salt tabs, fluid restriction and initial limited isotonic IV fluids, her sodium has now improved and has remained stable at 133.  Her Lasix was resumed and sodium remaining stable. But her mental status fluctuating without much progress.     Chronic diastolic CHF, elevated PA pressure  -recent 2D echo showed normal LVEF, right heart cath done January 2025 showed elevated RV pressure and PA pressure.  Imaging also showed mild mitral valve regurgitation, mild to moderate tricuspid valve regurgitation and aortic stenosis -There is a degree of third spacing with elevated BNP and trace bilateral pleural effusions, as well as trace lower extremity edema - she seems to be tolerating it well with stable sodium   Depression, failure to thrive -patient does report worsening depression, hopelessness and has been losing significant amount of weight in the setting of poor appetite following her COVID infection last year. -Patient was started on Remeron,.    Goals of care-discussed with the patient's daughter at bedside.  Despite improvement in her sodium level, she continues to remain extremely frail, weak.  It is asking me and her daughter this morning whether she is still alive.  There is as been a significant decline in the last several months, has had weight loss and overall she is failing to thrive.  Does not seem to be responding to medical management.  Palliative care consulted , plan to discharge to SNF with hospice services.  Today she was in atrial fib with RVR family was reluctant to start her on IV cardizem . They want her to be comfortable and not to be started on any life prolonging measures.  They requested her to be discharged back with hospice services and let her be comfortable.     Increased confusion/ Acute encephalopathy MRI of the brain without acute findings.  Mental status fluctuates. She has been lethargic since yesterday.  Patient's husband at  bedside, does not want any blood draws or anything to be done to prolong her life. He wants her to be comfortable and requests to be discharged to the facility today.    Hypothyroidism- TSH normal   PAF-continue with metoprolol for rate control .  Eliquis discontinued as pt family wanted to transition to comfort measures.     Hyperlipidemia-   CAD-cath done end of January showed CAD, medical management recommended.  No chest pain    Consultants: palliative care Procedures performed: none Disposition: Hospice care Diet recommendation:   DISCHARGE MEDICATION: Allergies as of 11/26/2023       Reactions   Hydrocodone Other (See Comments)   "feels weird"   Prednisone Nausea And Vomiting, Other (See Comments)   Oral version = N/V        Medication List     STOP taking these medications    atorvastatin 40 MG tablet Commonly known as: LIPITOR   CALCIUM + D PO   calcium carbonate 500 MG chewable tablet Commonly known as: TUMS - dosed in mg elemental calcium   Centrum Silver 50+Women Tabs   Eliquis 2.5 MG Tabs tablet Generic drug: apixaban   levofloxacin 500 MG tablet Commonly known as: LEVAQUIN   levothyroxine 88 MCG tablet Commonly known as: SYNTHROID   losartan 50 MG tablet Commonly known as: COZAAR   PATADAY OP   PreserVision AREDS Caps       TAKE these medications    amLODipine 5 MG tablet Commonly known as: NORVASC Take 5 mg by mouth at bedtime.   furosemide 20 MG tablet Commonly known as: LASIX TAKE 1 TABLET(20 MG) BY MOUTH DAILY What changed: See the new instructions.   isosorbide mononitrate 30 MG 24 hr tablet Commonly known as: IMDUR Take 30 mg by mouth See admin instructions.  Take 30 mg by mouth mid-morning   metoprolol succinate 100 MG 24 hr tablet Commonly known as: TOPROL-XL Take 1 tablet (100 mg total) by mouth daily. Take with or immediately following a meal. What changed:  when to take this additional instructions   mirtazapine 15 MG tablet Commonly known as: REMERON Take 1 tablet (15 mg total) by mouth at bedtime.   nitroGLYCERIN 0.4 MG SL tablet Commonly known as: NITROSTAT For chest pain, tightness, or pressure. While sitting, place 1 tablet under tongue. May be used every 5 minutes as needed, for up to 15 minutes. Do not use more than 3  tablets. What changed:  how much to take how to take this when to take this reasons to take this additional instructions   TYLENOL 500 MG tablet Generic drug: acetaminophen Take 500-1,000 mg by mouth See admin instructions. Take 500-1,000 mg by mouth with breakfast and at bedtime        Follow-up Information     Adrian Prince, MD Follow up in 1 week(s).   Specialty: Endocrinology Contact information: [address] [place] [address] [phone]                Discharge Exam: Ceasar Mons Weights   11/23/23 0455 11/21/2023 0441 11/14/2023 1136  Weight: 49.9 kg 49 kg 49 kg   General exam: Appears comfortable.  Respiratory system: diminished at bedside.  Cardiovascular system: S1 & S2 heard, irregularly irregular, tachycardic.  Gastrointestinal system: Abdomen is soft, bs+ Central nervous system: minimally responsive.  Extremities: No pedal edema.  Skin: No rashes,  Psychiatry: unable to assess.    Condition at discharge: fair  The results of significant diagnostics from  this hospitalization (including imaging, microbiology, ancillary and laboratory) are listed below for reference.   Imaging Studies: MR BRAIN WO CONTRAST Result Date: 11/19/2023 CLINICAL DATA:  Mental status change, unknown cause EXAM: MRI HEAD WITHOUT CONTRAST TECHNIQUE: Multiplanar, multiecho pulse sequences of the brain and surrounding structures were obtained without intravenous contrast. COMPARISON:  CT head November 05, 2013. FINDINGS: Brain: No acute infarction, hemorrhage, hydrocephalus, extra-axial collection or mass lesion. Mild to moderate for age T2/FLAIR hyperintensity white matter, nonspecific but compatible with chronic microvascular disease. Vascular: Major arterial flow voids are maintained at the skull base. Skull and upper cervical spine: Normal marrow signal. Sinuses/Orbits: Clear sinuses.  No acute orbital findings. Other: No mastoid effusions. IMPRESSION: No evidence of acute  intracranial abnormality. Electronically Signed   By: Feliberto Harts M.D.   On: 11/19/2023 21:46   DG Chest Port 1 View Result Date: 11/18/2023 CLINICAL DATA:  Cough and fatigue. EXAM: PORTABLE CHEST 1 VIEW COMPARISON:  Chest radiograph dated 11/12/2023. FINDINGS: Trace bilateral pleural effusions and bibasilar atelectasis. No focal consolidation or pneumothorax. Stable cardiomegaly. Atherosclerotic calcification of the aorta. No acute osseous pathology. Bilateral breast calcifications noted. IMPRESSION: Trace bilateral pleural effusions and bibasilar atelectasis. Electronically Signed   By: Elgie Collard M.D.   On: 11/18/2023 16:30   DG Chest Port 1 View Result Date: 11/12/2023 CLINICAL DATA:  Intermittent melena EXAM: PORTABLE CHEST 1 VIEW COMPARISON:  Chest radiograph dated 07/09/2023 FINDINGS: Normal lung volumes. No focal consolidations. No pleural effusion or pneumothorax. Similar enlarged cardiomediastinal silhouette. No acute osseous abnormality. Surgical clips project over the neck. IMPRESSION: 1. No acute disease. 2. Similar cardiomegaly. Electronically Signed   By: Agustin Cree M.D.   On: 11/12/2023 10:19   CARDIAC CATHETERIZATION Result Date: 11/04/2023   Mid RCA lesion is 100% stenosed.   Mid Cx lesion is 50% stenosed.   Mid LAD lesion is 100% stenosed.   Prox LAD to Mid LAD lesion is 50% stenosed.   Ost LAD to Prox LAD lesion is 70% stenosed. 1.  Chronic total occlusion of right coronary artery collateralized via left circumflex. 2.  Chronic total occlusion of mid LAD. 3.  Moderate obstructive disease of proximal LAD; this should be treated medically. 4.  Mean gradient across the aortic valve of 20 mmHg consistent with moderate aortic stenosis 5.  Fick cardiac output of 4.5 L/min and Fick cardiac index of 2.9 L/min/m with the following hemodynamics:  Right atrial pressure mean of 9 mmHg  Right ventricular pressure 45/-1 with an end-diastolic pressure of 7 mmHg  Wedge pressure mean of 16  mmHg with V waves to 20 mmHg  PA pressure 47/19 with a mean of 31 mmHg  PVR of 3.3 Woods units  PA pulsatility index of 3.1 6.  LVEDP of 17 mmHg Recommendation: Medical therapy.    Microbiology: Results for orders placed or performed during the hospital encounter of 11/18/23  Blood culture (routine x 2)     Status: None   Collection Time: 11/18/23  2:46 PM   Specimen: BLOOD RIGHT ARM  Result Value Ref Range Status   Specimen Description   Final    BLOOD RIGHT ARM Performed at [facility], [address] [place], [place] [address]    Special Requests   Final    BOTTLES DRAWN AEROBIC AND ANAEROBIC Blood Culture results may not be optimal due to an inadequate volume of blood received in culture bottles Performed at [facility], [address], [place], [place] [address]  Culture   Final    NO GROWTH 5 DAYS Performed at [facility], [address], [place], [place] [address]    Report Status 11/23/2023 FINAL  Final  Blood culture (routine x 2)     Status: None   Collection Time: 11/18/23  2:47 PM   Specimen: BLOOD LEFT ARM  Result Value Ref Range Status   Specimen Description   Final    BLOOD LEFT ARM Performed at [facility], [address] [place], [place] [address]    Special Requests   Final    BOTTLES DRAWN AEROBIC AND ANAEROBIC Blood Culture results may not be optimal due to an inadequate volume of blood received in culture bottles Performed at [facility], [address], [place], [place] [address]    Culture   Final    NO GROWTH 5 DAYS Performed at [facility], [address], [place], [place] [address]    Report Status 11/23/2023 FINAL  Final    Labs: CBC: Recent Labs  Lab 11/19/23 0536 11/20/23 0712 11/21/23 0625 11/22/23 0722 11/15/2023 0535  WBC 8.2 8.4 8.9 7.5 6.5  HGB 13.7 13.6 13.4 13.5 13.3  HCT 41.7 41.4 42.5 43.1 45.1  MCV 96.8 97.2 101.0* 100.0 106.4*  PLT 203 226 209 221 212    Basic Metabolic Panel: Recent Labs  Lab 11/20/23 0712 11/21/23 0625 11/22/23 0722 11/23/23 0742 11/10/2023 0535  NA 128* 133* 133* 133* 138  K 3.9 4.4 4.5 4.6 5.2*  CL 90* 95* 92* 89* 91*  CO2 29 32 33* 37* 38*  GLUCOSE 137* 73 107* 113* 87  BUN 20 21 19 18  37*  CREATININE 0.47 0.54 0.44 0.47 0.71  CALCIUM 8.1* 8.0* 7.9* 7.9* 8.2*  MG 2.2 2.3  --   --  2.6*   Liver Function Tests: Recent Labs  Lab 11/18/23 1414 11/19/23 0536 11/20/23 0712 11/21/23 0625 11/06/2023 0535  AST 42* 39 38 33 33  ALT 20 22 24 21 22   ALKPHOS 69 71 75 67 71  BILITOT 1.5* 1.5* 1.2 0.9 1.1  PROT 8.6* 8.3* 8.6* 7.5 7.8  ALBUMIN 3.7 3.6 3.5 3.0* 3.1*   CBG: Recent Labs  Lab 11/18/23 1334  GLUCAP 106*    Discharge time spent: 42 minutes. ( Including time spent on discussing GOC with family.)  Signed: Kathlen Mody, MD [facility] 11/11/2023

## 2023-12-04 NOTE — Care Management Important Message (Signed)
 Important Message  Patient Details No IM Letter given due to Discharging with Hospice. Name: CHARIKA MIKELSON MRN: [id number] Date of Birth: 24-Dec-1935   Important Message Given:  No     Caren Macadam 11/15/2023, 11:31 AM

## 2023-12-04 NOTE — Consult Note (Signed)
 Value-Based Care [facility] Liaison Consult Note   11/23/2023  GIZZELLE LACOMB 1936-02-15 [id number]  Insurance: Valinda Hoar [facility] Medicare   Primary Care Provider: Adrian Prince, MD, [facility], this provider participates for Pharmacy needs only in VBCI is listed for the transition of care follow up appointments     [facility] Liaison screened the patient remotely at [facility].    The patient was screened for 7 day readmission hospitalization with noted high risk score for unplanned readmission risk 2 hospital admissions in 6 months.  The patient was assessed for potential Community Care Coordination service needs for post hospital transition for care coordination with [facility] program with PCP. Review of patient's electronic medical record reveals patient has no Pharmacy program needs and is noted for returning to [facility] with Hospice Care through [facility]  Plan: No VBCI needs as patient is to have care coordination needs met at SNF with Hospice as referred to [facility] at facility per inpatient [facility] team notes.  For questions contact:   Charlesetta Shanks, RN, BSN, CCM [place]  [facility], [facility] Liaison Direct Dial: [phone] or secure chat Email: [email or web address]

## 2023-12-04 NOTE — Progress Notes (Signed)
 Daily Progress Note   Patient Name: [name]       Date: 11/10/2023 DOB: [birth year]  Age: 88 y.o. MRN#: [id number] Attending Physician: Kathlen Mody, MD Primary Care Physician: Adrian Prince, MD Admit Date: 11/18/2023  Reason for Consultation/Follow-up: Establishing goals of care  Subjective: Noted to have shallow respirations, mild distress husband at bedside  Length of Stay: 5  Current Medications: Scheduled Meds:   apixaban  2.5 mg Oral BID   atorvastatin  40 mg Oral QHS   feeding supplement  237 mL Oral BID BM   furosemide  20 mg Oral Daily   isosorbide mononitrate  30 mg Oral q AM   levothyroxine  88 mcg Oral QAC breakfast   metoprolol succinate  100 mg Oral Daily   mirtazapine  15 mg Oral QHS   sodium chloride flush  3 mL Intravenous Q12H   sodium chloride  1 g Oral TID WC    Continuous Infusions:   PRN Meds: acetaminophen **OR** acetaminophen, albuterol, metoprolol tartrate, morphine injection, prochlorperazine, senna-docusate  Physical Exam         Frail, weak appearing lady Shallow respirations Mild generalized distress Trace edema  Vital Signs: BP 127/76 (BP Location: Left Arm)   Pulse (!) 112   Temp 99.1 F (37.3 C)   Resp 16   Wt 49 kg   SpO2 99%   BMI 19.14 kg/m  SpO2: SpO2: 99 % O2 Device: O2 Device: Nasal Cannula O2 Flow Rate: O2 Flow Rate (L/min): 3 L/min  Intake/output summary:  Intake/Output Summary (Last 24 hours) at 11/28/2023 0827 Last data filed at 11/07/2023 8657 Gross per 24 hour  Intake 0 ml  Output 550 ml  Net -550 ml   LBM: Last BM Date : 11/19/23 Baseline Weight: Weight: 49.9 kg Most recent weight: Weight: 49 kg       Palliative Assessment/Data:      Patient Active Problem List   Diagnosis Date Noted   Acute  encephalopathy 11/18/2023   Melena 11/12/2023   Influenza A 11/12/2023   Sepsis (HCC) 11/12/2023   Cystitis 11/12/2023   Urinary tract infection without hematuria 11/09/2023   Acute bronchitis due to other specified organisms 11/09/2023   Excessive ear wax, left 03/23/2023   Nonrheumatic aortic valve stenosis 03/20/2023   Acute respiratory failure with hypoxia (HCC)  09/25/2022   Hypervolemia 09/25/2022   DNR (do not resuscitate)/DNI(Do Not Intubate) 09/25/2022   Chronic heart failure with preserved ejection fraction (HFpEF) (HCC) 09/25/2022   PAF (paroxysmal atrial fibrillation) (HCC) 08/05/2021   Stable branch retinal vein occlusion of left eye 08/19/2020   Exudative age-related macular degeneration of right eye with inactive choroidal neovascularization (HCC) 08/19/2020   Dry eyes 04/16/2020   Intermediate stage nonexudative age-related macular degeneration of both eyes 04/16/2020   Dizziness 11/30/2019   Dyslipidemia 05/18/2019   Coronary artery disease involving native coronary artery of native heart without angina pectoris 01/01/2019   Tremor 11/01/2018   Shortness of breath 09/25/2018   Hypothyroidism 10/30/2013   TIA (transient ischemic attack)- remote 10/30/2013   Dyspnea 10/28/2013   PSVT (paroxysmal supraventricular tachycardia) (HCC) 10/28/2013   History of pulmonary embolism 10/28/2013   Hyponatremia 10/28/2013   Osteoporosis    Essential hypertension, benign 07/11/2009   HYPERLIPIDEMIA 01/10/2009   CAROTID STENOSIS- moderate 01/10/2009   Macular degeneration (senile) of retina 01/09/2009   CAD- severe LAD and RCA disease with collaterals from CFX Sept 2009. Medical Rx 01/09/2009   Diverticulosis of colon 01/09/2009   Rheumatoid arthritis (HCC) 01/09/2009   Osteoporosis 01/09/2009    Palliative Care Assessment & Plan   Patient Profile:    Assessment:  88 year old lady with coronary artery disease, paroxysmal atrial fibrillation on Eliquis, hypothyroidism,  chronic hyponatremia and recent influenza infection, COVID infection late last year in 2024.  Patient with gradual progressive functional decline for the past few months. Patient admitted with acute encephalopathy, ongoing generalized weakness, acute on chronic hyponatremia, failure to thrive and chronic diastolic CHF.  She was started on Remeron.  MRI of the brain without acute findings.  Not able to participate with PT.  Palliative consult for ongoing goals of care discussions has been requested.  Recommendations/Plan: Goals of care discussed again with husband present at bedside.  He is accepting of hospice services, wants hospice services to follow the patient and for her to be discharged back to her regular facility with the addition of hospice services.  Continue current mode of care otherwise. DNR Patient has as needed opioids for pain and/or shortness of breath.   Code Status:    Code Status Orders  (From admission, onward)           Start     Ordered   11/18/23 1558  Do not attempt resuscitation (DNR)- Limited -Do Not Intubate (DNI)  Continuous       Question Answer Comment  If pulseless and not breathing No CPR or chest compressions.   In Pre-Arrest Conditions (Patient Is Breathing and Has A Pulse) Do not intubate. Provide all appropriate non-invasive medical interventions. Avoid ICU transfer unless indicated or required.   Consent: Discussion documented in EHR or advanced directives reviewed      11/18/23 1559           Code Status History     Date Active Date Inactive Code Status Order ID Comments User Context   11/12/2023 1544 11/14/2023 2153 Limited: Do not attempt resuscitation (DNR) -DNR-LIMITED -Do Not Intubate/DNI  [id number]  Gertha Calkin, MD ED   11/12/2023 1455 11/12/2023 1544 Limited: Do not attempt resuscitation (DNR) -DNR-LIMITED -Do Not Intubate/DNI  [id number]  Gertha Calkin, MD ED   11/04/2023 1511 11/04/2023 2041 Full Code [id number]  [name], MD  Inpatient   09/25/2022 0359 09/27/2022 2058 DNR [id number]  Carollee Herter, DO ED   09/25/2022 0331 09/25/2022 2595  DNR [id number]  Carollee Herter, DO ED   09/25/2018 1606 09/28/2018 1330 DNR [id number]  Lanae Boast, MD Inpatient   10/28/2013 1600 11/01/2013 1432 Full Code [id number]  Martha Clan, MD Inpatient       Prognosis:  < 2 weeks  Discharge Planning: Back to facility with hospice  Care plan was discussed with husband at bedside  Thank you for allowing the Palliative Medicine Team to assist in the care of this patient.  Mod MDM     Greater than 50%  of this time was spent counseling and coordinating care related to the above assessment and plan.  Rosalin Hawking, MD  Please contact Palliative Medicine Team phone at [phone] for questions and concerns.

## 2023-12-04 NOTE — Progress Notes (Signed)
 RN called facility earlier to give report. Pt's nurse was unavailable per nursing supervisor. This RN's phone number given to facility and pt's receiving nurse will call back.   Telephone call received from facility. Report given to receiving nurse Shadequa. All questions answered.

## 2023-12-04 NOTE — Progress Notes (Signed)
 PT Cancellation Note  Patient Details Name: [name] MRN: [id number] DOB: [birth year]   Cancelled Treatment:    Reason Eval/Treat Not Completed:  Pt not appropriate for PT at this time. Per chart review, plan is for pt to return to SNF with hospice.    Faye Ramsay, PT Acute Rehabilitation  Office: [phone]

## 2023-12-04 NOTE — TOC Transition Note (Signed)
 Transition of Care [facility]) - Discharge Note   Patient Details  Name: [name] MRN: [id number] Date of Birth: 1936/08/09  Transition of Care [facility]) CM/SW Contact:  Otelia Santee, LCSW Phone Number: 12/01/2023, 10:46 AM   Clinical Narrative:    Pt recommended for hospice services. Spoke with pt's spouse and daughter and confirmed plan to return to [facility] with hospice. Family have selected [facility] as hospice agency of preference. Referral made to [facility]. [facility] able to accept pt back to their facility today. Pt will be going to room 612. Call to report at [phone]. DC packet with signed DNR placed at RN station. [facility] called at 11:33am for transportation.    Final next level of care: Home w Hospice Care (SNF) Barriers to Discharge: No Barriers Identified   Patient Goals and CMS Choice Patient states their goals for this hospitalization and ongoing recovery are:: return home   Choice offered to / list presented to : Spouse [place] ownership interest in [facility].provided to:: Spouse    Discharge Placement              Patient chooses bed at: [facility] Patient to be transferred to facility by: [facility] Name of family member notified: Daughter and spouse Patient and family notified of of transfer: 11/29/2023  Discharge Plan and Services Additional resources added to the After Visit Summary for     Discharge Planning Services: NA Post Acute Care Choice: Home Health          DME Arranged: N/A DME Agency: NA                  Social Drivers of Health (SDOH) Interventions SDOH Screenings   Food Insecurity: No Food Insecurity (11/18/2023)  Housing: Low Risk  (11/18/2023)  Transportation Needs: No Transportation Needs (11/18/2023)  Utilities: Not At Risk (11/18/2023)  Social Connections: Socially Integrated (11/18/2023)  Tobacco Use: Low Risk  (11/18/2023)     Readmission Risk Interventions    11/09/2023   10:44 AM 11/19/2023    2:48 PM   Readmission Risk Prevention Plan  Transportation Screening Complete Complete  PCP or Specialist Appt within 5-7 Days  Complete  PCP or Specialist Appt within 3-5 Days Complete   Home Care Screening  Complete  Medication Review (RN CM)  Complete  HRI or Home Care Consult Complete   Social Work Consult for Recovery Care Planning/Counseling Complete   Palliative Care Screening Complete   Medication Review [employer]) Complete

## 2023-12-04 NOTE — Plan of Care (Signed)
  Problem: Education: Goal: Knowledge of General Education information will improve Description: Including pain rating scale, medication(s)/side effects and non-pharmacologic comfort measures Outcome: Progressing   Problem: Clinical Measurements: Goal: Ability to maintain clinical measurements within normal limits will improve Outcome: Progressing Goal: Will remain free from infection Outcome: Progressing Goal: Respiratory complications will improve Outcome: Progressing   Problem: Elimination: Goal: Will not experience complications related to urinary retention Outcome: Progressing   Problem: Safety: Goal: Ability to remain free from injury will improve Outcome: Progressing   Problem: Skin Integrity: Goal: Risk for impaired skin integrity will decrease Outcome: Progressing

## 2023-12-04 NOTE — Progress Notes (Signed)
 [facility] Liaison Note  Received request from [facility] for hospice services at facility after discharge. Plan is for discharge today.   Please send signed and completed DNR home with patient/family. Please provide prescriptions at discharge as needed to ensure ongoing symptom management.  Please call with any concerns.  Thank you for the opportunity to participate in this patient's care.   Henderson Newcomer, LPN [facility] Liaison [phone]

## 2023-12-04 DEATH — deceased

## 2024-01-27 ENCOUNTER — Ambulatory Visit: Payer: Medicare Other | Admitting: Cardiology
